# Patient Record
Sex: Female | Born: 1935 | Race: White | Hispanic: No | Marital: Married | Smoking: Never smoker
Health system: Southern US, Community
[De-identification: ages and names within clinical notes are randomized; demographics above are authoritative.]

## PROBLEM LIST (undated history)

## (undated) DIAGNOSIS — D649 Anemia, unspecified: Secondary | ICD-10-CM

## (undated) DIAGNOSIS — J439 Emphysema, unspecified: Secondary | ICD-10-CM

## (undated) DIAGNOSIS — R911 Solitary pulmonary nodule: Secondary | ICD-10-CM

## (undated) DIAGNOSIS — J45909 Unspecified asthma, uncomplicated: Secondary | ICD-10-CM

## (undated) DIAGNOSIS — I350 Nonrheumatic aortic (valve) stenosis: Secondary | ICD-10-CM

## (undated) DIAGNOSIS — J339 Nasal polyp, unspecified: Secondary | ICD-10-CM

## (undated) DIAGNOSIS — I2510 Atherosclerotic heart disease of native coronary artery without angina pectoris: Secondary | ICD-10-CM

## (undated) DIAGNOSIS — K219 Gastro-esophageal reflux disease without esophagitis: Secondary | ICD-10-CM

## (undated) DIAGNOSIS — J189 Pneumonia, unspecified organism: Secondary | ICD-10-CM

## (undated) DIAGNOSIS — R0600 Dyspnea, unspecified: Secondary | ICD-10-CM

## (undated) DIAGNOSIS — E785 Hyperlipidemia, unspecified: Secondary | ICD-10-CM

## (undated) DIAGNOSIS — E039 Hypothyroidism, unspecified: Secondary | ICD-10-CM

## (undated) DIAGNOSIS — K7460 Unspecified cirrhosis of liver: Secondary | ICD-10-CM

## (undated) DIAGNOSIS — Z8719 Personal history of other diseases of the digestive system: Secondary | ICD-10-CM

## (undated) DIAGNOSIS — M1990 Unspecified osteoarthritis, unspecified site: Secondary | ICD-10-CM

## (undated) DIAGNOSIS — G459 Transient cerebral ischemic attack, unspecified: Secondary | ICD-10-CM

## (undated) DIAGNOSIS — N189 Chronic kidney disease, unspecified: Secondary | ICD-10-CM

## (undated) DIAGNOSIS — I10 Essential (primary) hypertension: Secondary | ICD-10-CM

## (undated) HISTORY — DX: Nonrheumatic aortic (valve) stenosis: I35.0

## (undated) HISTORY — PX: NASAL SINUS SURGERY: SHX719

## (undated) HISTORY — DX: Nasal polyp, unspecified: J33.9

## (undated) HISTORY — DX: Unspecified osteoarthritis, unspecified site: M19.90

## (undated) HISTORY — DX: Hyperlipidemia, unspecified: E78.5

## (undated) HISTORY — DX: Solitary pulmonary nodule: R91.1

## (undated) HISTORY — DX: Essential (primary) hypertension: I10

## (undated) HISTORY — DX: Unspecified cirrhosis of liver: K74.60

## (undated) HISTORY — DX: Chronic kidney disease, unspecified: N18.9

---

## 1997-05-23 ENCOUNTER — Other Ambulatory Visit: Admission: RE | Admit: 1997-05-23 | Discharge: 1997-05-23 | Payer: Self-pay | Admitting: Gynecology

## 1998-01-03 ENCOUNTER — Other Ambulatory Visit: Admission: RE | Admit: 1998-01-03 | Discharge: 1998-01-03 | Payer: Self-pay | Admitting: Gynecology

## 1998-02-08 ENCOUNTER — Other Ambulatory Visit: Admission: RE | Admit: 1998-02-08 | Discharge: 1998-02-08 | Payer: Self-pay | Admitting: Gynecology

## 1999-01-25 ENCOUNTER — Other Ambulatory Visit: Admission: RE | Admit: 1999-01-25 | Discharge: 1999-01-25 | Payer: Self-pay | Admitting: Gynecology

## 2000-01-09 ENCOUNTER — Other Ambulatory Visit: Admission: RE | Admit: 2000-01-09 | Discharge: 2000-01-09 | Payer: Self-pay | Admitting: Internal Medicine

## 2003-09-27 HISTORY — PX: ROTATOR CUFF REPAIR: SHX139

## 2004-01-04 ENCOUNTER — Inpatient Hospital Stay (HOSPITAL_COMMUNITY): Admission: EM | Admit: 2004-01-04 | Discharge: 2004-01-05 | Payer: Self-pay | Admitting: Emergency Medicine

## 2004-01-04 ENCOUNTER — Ambulatory Visit: Payer: Self-pay | Admitting: Internal Medicine

## 2004-02-14 ENCOUNTER — Encounter: Admission: RE | Admit: 2004-02-14 | Discharge: 2004-02-14 | Payer: Self-pay | Admitting: Gastroenterology

## 2004-02-22 ENCOUNTER — Encounter (INDEPENDENT_AMBULATORY_CARE_PROVIDER_SITE_OTHER): Payer: Self-pay | Admitting: Specialist

## 2004-02-22 ENCOUNTER — Observation Stay (HOSPITAL_COMMUNITY): Admission: RE | Admit: 2004-02-22 | Discharge: 2004-02-23 | Payer: Self-pay

## 2004-02-27 HISTORY — PX: CHOLECYSTECTOMY: SHX55

## 2004-04-16 ENCOUNTER — Other Ambulatory Visit: Admission: RE | Admit: 2004-04-16 | Discharge: 2004-04-16 | Payer: Self-pay | Admitting: Family Medicine

## 2004-07-31 ENCOUNTER — Ambulatory Visit (HOSPITAL_COMMUNITY): Admission: RE | Admit: 2004-07-31 | Discharge: 2004-07-31 | Payer: Self-pay | Admitting: Gastroenterology

## 2004-07-31 ENCOUNTER — Encounter (INDEPENDENT_AMBULATORY_CARE_PROVIDER_SITE_OTHER): Payer: Self-pay | Admitting: Specialist

## 2005-04-29 ENCOUNTER — Encounter: Admission: RE | Admit: 2005-04-29 | Discharge: 2005-04-29 | Payer: Self-pay | Admitting: Family Medicine

## 2005-06-03 ENCOUNTER — Other Ambulatory Visit: Admission: RE | Admit: 2005-06-03 | Discharge: 2005-06-03 | Payer: Self-pay | Admitting: Family Medicine

## 2006-10-29 HISTORY — PX: TRIGGER FINGER RELEASE: SHX641

## 2007-06-11 ENCOUNTER — Other Ambulatory Visit: Admission: RE | Admit: 2007-06-11 | Discharge: 2007-06-11 | Payer: Self-pay | Admitting: Family Medicine

## 2008-02-25 ENCOUNTER — Encounter: Admission: RE | Admit: 2008-02-25 | Discharge: 2008-02-25 | Payer: Self-pay | Admitting: Family Medicine

## 2008-06-07 ENCOUNTER — Encounter: Admission: RE | Admit: 2008-06-07 | Discharge: 2008-06-07 | Payer: Self-pay | Admitting: General Surgery

## 2008-12-29 ENCOUNTER — Other Ambulatory Visit: Admission: RE | Admit: 2008-12-29 | Discharge: 2008-12-29 | Payer: Self-pay | Admitting: Family Medicine

## 2009-09-12 ENCOUNTER — Ambulatory Visit (HOSPITAL_COMMUNITY): Admission: RE | Admit: 2009-09-12 | Discharge: 2009-09-14 | Payer: Self-pay | Admitting: Obstetrics and Gynecology

## 2009-09-12 ENCOUNTER — Encounter (INDEPENDENT_AMBULATORY_CARE_PROVIDER_SITE_OTHER): Payer: Self-pay | Admitting: Obstetrics and Gynecology

## 2009-09-12 HISTORY — PX: INCONTINENCE SURGERY: SHX676

## 2009-12-13 ENCOUNTER — Ambulatory Visit (HOSPITAL_COMMUNITY): Admission: RE | Admit: 2009-12-13 | Discharge: 2009-12-13 | Payer: Self-pay | Admitting: Gastroenterology

## 2010-02-11 ENCOUNTER — Encounter: Payer: Self-pay | Admitting: Family Medicine

## 2010-03-14 ENCOUNTER — Ambulatory Visit (HOSPITAL_COMMUNITY)
Admission: RE | Admit: 2010-03-14 | Discharge: 2010-03-14 | Disposition: A | Discharge status: Home / Self Care | Payer: Federal, State, Local not specified - PPO | Source: Ambulatory Visit | Attending: Gastroenterology | Admitting: Gastroenterology

## 2010-03-14 DIAGNOSIS — E785 Hyperlipidemia, unspecified: Secondary | ICD-10-CM | POA: Insufficient documentation

## 2010-03-14 DIAGNOSIS — Z90710 Acquired absence of both cervix and uterus: Secondary | ICD-10-CM | POA: Insufficient documentation

## 2010-03-14 DIAGNOSIS — I10 Essential (primary) hypertension: Secondary | ICD-10-CM | POA: Insufficient documentation

## 2010-03-14 DIAGNOSIS — Z860100 Personal history of colon polyps, unspecified: Secondary | ICD-10-CM | POA: Insufficient documentation

## 2010-03-14 DIAGNOSIS — R7989 Other specified abnormal findings of blood chemistry: Secondary | ICD-10-CM | POA: Insufficient documentation

## 2010-03-14 DIAGNOSIS — J45909 Unspecified asthma, uncomplicated: Secondary | ICD-10-CM | POA: Insufficient documentation

## 2010-03-14 DIAGNOSIS — Z79899 Other long term (current) drug therapy: Secondary | ICD-10-CM | POA: Insufficient documentation

## 2010-03-20 ENCOUNTER — Other Ambulatory Visit: Payer: Self-pay | Admitting: Family Medicine

## 2010-03-20 DIAGNOSIS — E785 Hyperlipidemia, unspecified: Secondary | ICD-10-CM

## 2010-03-20 DIAGNOSIS — R42 Dizziness and giddiness: Secondary | ICD-10-CM

## 2010-03-21 ENCOUNTER — Ambulatory Visit
Admission: RE | Admit: 2010-03-21 | Discharge: 2010-03-21 | Disposition: A | Discharge status: Home / Self Care | Payer: Federal, State, Local not specified - PPO | Source: Ambulatory Visit | Attending: Family Medicine | Admitting: Family Medicine

## 2010-03-21 DIAGNOSIS — R42 Dizziness and giddiness: Secondary | ICD-10-CM

## 2010-03-21 DIAGNOSIS — E785 Hyperlipidemia, unspecified: Secondary | ICD-10-CM

## 2010-04-06 LAB — SURGICAL PCR SCREEN
MRSA, PCR: NEGATIVE
Staphylococcus aureus: NEGATIVE

## 2010-04-06 LAB — CBC
HCT: 29.4 % — ABNORMAL LOW (ref 36.0–46.0)
HCT: 38.2 % (ref 36.0–46.0)
Hemoglobin: 10 g/dL — ABNORMAL LOW (ref 12.0–15.0)
Hemoglobin: 12.9 g/dL (ref 12.0–15.0)
MCH: 31.2 pg (ref 26.0–34.0)
MCH: 31.3 pg (ref 26.0–34.0)
MCHC: 33.9 g/dL (ref 30.0–36.0)
MCHC: 34 g/dL (ref 30.0–36.0)
MCV: 91.9 fL (ref 78.0–100.0)
MCV: 92.1 fL (ref 78.0–100.0)
Platelets: 186 10*3/uL (ref 150–400)
Platelets: 234 10*3/uL (ref 150–400)
RBC: 3.2 MIL/uL — ABNORMAL LOW (ref 3.87–5.11)
RBC: 4.16 MIL/uL (ref 3.87–5.11)
RDW: 13.4 % (ref 11.5–15.5)
RDW: 13.9 % (ref 11.5–15.5)
WBC: 14.5 10*3/uL — ABNORMAL HIGH (ref 4.0–10.5)
WBC: 7.3 10*3/uL (ref 4.0–10.5)

## 2010-04-06 LAB — URINALYSIS, ROUTINE W REFLEX MICROSCOPIC
Bilirubin Urine: NEGATIVE
Glucose, UA: NEGATIVE mg/dL
Hgb urine dipstick: NEGATIVE
Ketones, ur: NEGATIVE mg/dL
Nitrite: NEGATIVE
Protein, ur: NEGATIVE mg/dL
Specific Gravity, Urine: 1.015 (ref 1.005–1.030)
Urobilinogen, UA: 0.2 mg/dL (ref 0.0–1.0)
pH: 7 (ref 5.0–8.0)

## 2010-04-06 LAB — COMPREHENSIVE METABOLIC PANEL
ALT: 28 U/L (ref 0–35)
ALT: 42 U/L — ABNORMAL HIGH (ref 0–35)
AST: 33 U/L (ref 0–37)
AST: 52 U/L — ABNORMAL HIGH (ref 0–37)
Albumin: 3 g/dL — ABNORMAL LOW (ref 3.5–5.2)
Albumin: 3.9 g/dL (ref 3.5–5.2)
Alkaline Phosphatase: 144 U/L — ABNORMAL HIGH (ref 39–117)
Alkaline Phosphatase: 205 U/L — ABNORMAL HIGH (ref 39–117)
BUN: 15 mg/dL (ref 6–23)
BUN: 9 mg/dL (ref 6–23)
CO2: 31 mEq/L (ref 19–32)
CO2: 35 mEq/L — ABNORMAL HIGH (ref 19–32)
Calcium: 8.2 mg/dL — ABNORMAL LOW (ref 8.4–10.5)
Calcium: 9.9 mg/dL (ref 8.4–10.5)
Chloride: 97 mEq/L (ref 96–112)
Chloride: 98 mEq/L (ref 96–112)
Creatinine, Ser: 0.99 mg/dL (ref 0.4–1.2)
Creatinine, Ser: 1.17 mg/dL (ref 0.4–1.2)
GFR calc Af Amer: 55 mL/min — ABNORMAL LOW (ref >60)
GFR calc Af Amer: >60 mL/min (ref >60)
GFR calc non Af Amer: 45 mL/min — ABNORMAL LOW (ref >60)
GFR calc non Af Amer: 55 mL/min — ABNORMAL LOW (ref >60)
Glucose, Bld: 122 mg/dL — ABNORMAL HIGH (ref 70–99)
Glucose, Bld: 93 mg/dL (ref 70–99)
Potassium: 3.4 mEq/L — ABNORMAL LOW (ref 3.5–5.1)
Potassium: 3.8 mEq/L (ref 3.5–5.1)
Sodium: 134 mEq/L — ABNORMAL LOW (ref 135–145)
Sodium: 140 mEq/L (ref 135–145)
Total Bilirubin: 0.7 mg/dL (ref 0.3–1.2)
Total Bilirubin: 0.9 mg/dL (ref 0.3–1.2)
Total Protein: 5.4 g/dL — ABNORMAL LOW (ref 6.0–8.3)
Total Protein: 7.7 g/dL (ref 6.0–8.3)

## 2010-04-06 LAB — URINE MICROSCOPIC-ADD ON

## 2010-06-08 NOTE — Cardiovascular Report (Signed)
[name], [name]                 ACCOUNT NO.:  [id number]   MEDICAL RECORD NO.:  [id number]          PATIENT TYPE:  INP   LOCATION:  [id number]                         FACILITY:  [facility]   PHYSICIAN:  Ernestine Mcmurray, M.D. LHCDATE OF BIRTH:  1931-02-24   DATE OF PROCEDURE:  01/05/2004  DATE OF DISCHARGE:  01/05/2004                              CARDIAC CATHETERIZATION   PROCEDURE:  1. Left heart catheterization with selective angiography.  2. Ventriculography.     DIAGNOSIS:  Non obstructive coronary artery disease.   INDICATION:  The patient is a 77 year old female with no prior history of  known coronary artery disease but with multiple risk factors. The patient  presents with atypical chest pain and she was referred for cardiac  catheterization. Informed consent was obtained and the patient was brought  for catheterization of the left heart where she was thoroughly prepped and  draped.  A #6 French arterial sheath was employed using the modified  Seldinger technique. Then the Granbury catheter were used for coronary  angiography and a pigtail for ventriculography. No complications occurred  with the procedure. At termination of the procedure all catheters and  sheaths were removed and the patient was brought back to the holding area.   FINDINGS:  1. HEMODYNAMICS. Left ventricular pressure 125/4 mmHg. Aortic pressure 1      mmHg with no gradient across the aortic valve.  2. VENTRICULOGRAPHY. Ejection fraction 55-60% with no wall motion      abnormalities and no mitral regurgitation.   SELECTIVE CORONARY ANGIOGRAPHY.  1. The left main coronary artery is a large caliber vessel with no      evidence of _______disease.  The left anterior descending artery was a      moderate to large caliber vessel with a proximal 50% stenosis and      diffuse stenosis in the mid vessel approximately 30-40%.  The first      diagonal had a 60% stenosis, the remainder of the diagonal branch was  free of ______disease.   1. The circumflex coronary artery was a large caliber vessel with a      paucity of 40-50% stenosis in the first obtuse marginal branch.   1. The right coronary artery was dominant. The mid body of the right      coronary artery had a proximal diffuse 20-30% stenosis, otherwise there      was no significant finding of coronary artery disease.     CONCLUSION:  Mild non obstructive coronary artery disease. No definite  indication for percutaneous intervention. Would continue medical therapy.      GED/MEDQ  D:  06/16/2004  T:  06/16/2004  Job:  [id number]   cc:   Junious Silk, M.D. [facility]

## 2010-06-08 NOTE — Op Note (Signed)
NAMEPETRONA, [name] [name]                 ACCOUNT NO.:  [id number]   MEDICAL RECORD NO.:  [id number]          PATIENT TYPE:  OBV   LOCATION:  [id number]                         [facility]ACILITY:  [facility]   PHYSICIAN:  Larrie Kass., M.D.DATE OF BIRTH:  September 10, 1931   DATE OF PROCEDURE:  02/22/2004  DATE OF DISCHARGE:                                 OPERATIVE REPORT   PREOPERATIVE DIAGNOSIS:  Symptomatic gallstones.   POSTOPERATIVE DIAGNOSIS:  Symptomatic gallstones.   OPERATION:  Laparoscopic cholecystectomy.   SURGEON:  Georgina Quint, M.D.   ASSISTANT:  Orson Ape. Rise Patience, M.D.   ANESTHESIA:  General.   PROCEDURE:  After the patient was monitored and anesthetized and had routine  preparation and draping of the abdomen, I made a short incision transversely  just below the umbilicus right over the palpable umbilical hernia. I  dissected the umbilical hernia contents away from the umbilical skin and  surrounding subcutaneous tissues and found that it was incarcerated  preperitoneal fat with a very tiny opening. I used cautery to amputate the  fat. I then extended the opening toward the umbilicus and a bit caudad as  well and grasped the fascia with Kochers and then bluntly entered the  peritoneal cavity with a Kelly clamp. I placed a 0 Vicryl pursestring suture  in the fascia, then secured a Hasson cannula and inflated the abdomen with  CO2. I examined the abdominal contents and saw no abnormalities of the  intestines or the peritoneal surfaces. The gallbladder was moderately  distended and inflamed with omentum adherent to the undersurface. I then  anesthetized three additional spots and placed three laparoscopic ports, one  in the epigastrium, two in the right mid abdomen and positioned the patient  head up, foot down and tilted to the left. Grasping the fundus of the  gallbladder and retracting it toward the right shoulder, I took down the  adhesions of bluntly and with cautery and identified  the infundibulum of  gallbladder. I pulled the infundibulum laterally toward the right and  dissected out the infundibulum as it formed the cystic duct. I also  dissected out the cystic artery. Vision was a little bit obscured by very  large lymph node in the porta hepatis and I removed that. I clipped the  cystic artery with three clips and cut between the two which were closest to  the gallbladder and then identified another small posterior cystic artery  branch which I also clipped and divided. I placed a clip on the cystic duct  as it emerged from the infundibulum of the gallbladder, made a small nick in  the cystic duct milked out a few tiny stones which were present in the  cystic duct.  I then clipped the cystic duct distally with three clips and  divided it. I dissected the gallbladder from the liver using the cautery and  gaining hemostasis with cautery as the dissection proceeded. After detaching  the gallbladder from the liver, I placed into a plastic pouch and removed it  through the umbilical incision and tied the pursestring suture to close the  defect.  We then copiously irrigated the right upper quadrant and removed the  irrigant and saw that the clips were secure and that hemostasis was  excellent. I removed  the two lateral ports under direct vision and saw no bleeding from the  abdominal wall.  I allowed the CO2 to escape and removed the epigastric  port. I closed all skin incisions with intracuticular 4-0 Vicryl and Steri-  Strips. The patient was stable through the procedure.      WB/MEDQ  D:  02/22/2004  T:  02/22/2004  Job:  [id number]   cc:   Elyse Jarvis. Amedeo Plenty, M.D.  [id number], [place]  [place] [address]  Fax: [phone]   Vikki Ports, M.D.  [address] [place], [place] [address]  Fax: [phone]

## 2010-06-08 NOTE — H&P (Signed)
[name] [name], [name] [name]                 ACCOUNT NO.:  [id number]   MEDICAL RECORD NO.:  [id number]          PATIENT TYPE:  EMS   LOCATION:  MAJO                         FACILITY:  [facility]   PHYSICIAN:  Ashby Dawes. Polite, M.D. DATE OF BIRTH:  1931/09/12   DATE OF ADMISSION:  01/04/2004  DATE OF DISCHARGE:                                HISTORY & PHYSICAL   CHIEF COMPLAINT:  Chest pain.   HISTORY OF PRESENT ILLNESS:  [name] [name] is a pleasant 77 year old female with  known history of hypertension, high cholesterol, hiatal hernia and GERD, who  presents to the ED with complaint of substernal chest pain.  The patient  states her symptoms started at rest approximately a half an hour after  eating pecans.  The patient later complained of feeling somewhat nauseated  as if she wanted to throw up.  The patient states symptoms were like  pressure and rated as 8/10; she denied any radiation.  As stated, the  patient states the pain started about 11:30 last p.m. and because of the  continued nature of these symptoms, the patient presented to the ED at her  husband's insistence of further evaluation to rule out coronary artery  disease.  In the ED, the patient was evaluated and had point-of-care enzymes  within normal limits, EKG within normal limits and patient had a chest x-ray  which showed hiatal hernia.  The patient was given sublingual nitroglycerin;  there is some controversy in the report if she got relief with the  nitroglycerin or not.  The patient states that she thinks her symptoms are  just starting to get better a half an hour to 45 minutes after receiving the  nitroglycerin.  Please note, the patient has had reflux symptoms in the  past, but denies past reflux symptoms being similar to her current chest  discomfort.  Also, please note the patient denies any orthopnea, any PND.  The patient does exercise approximately 3 times a week, walking about a mile  without chest pain, pressure or  palpitations.  Because of the patient's  history of hypertension and high cholesterol, admission is deemed necessary  for further evaluation of chest pain.   PAST MEDICAL HISTORY:  Past medical history, as stated above, is significant  for hypertension, high cholesterol and hiatal hernia/GERD.  Please note, the  patient had a stress test in the spring of '05 and per her report was  negative, performed by Dr. Wyonia Hough. [place].   MEDICATIONS ON ADMISSION:  Medications on admission include Nexium, Toprol,  hydrochlorothiazide, Zyrtec, Niaspan, Zetia.   SOCIAL HISTORY:  Social history is negative for tobacco.  Occasional  alcohol.  No drugs.   PAST SURGICAL HISTORY:  1.  Past surgical history is significant for sinus surgery in '97.  2.  The patient had nasal polyps removed.  3.  The patient had right rotator cuff surgery in September 2005.   FAMILY HISTORY:  Mother with hypertension and bronchiectasis.  Father  deceased of CVA.  The patient has 2 brothers, 1 with an MI in his 30s, 1  with  gluten intolerance.  The patient has a sister with history of  hypertension and questionable sarcoidosis.   ALLERGIES:  The patient described allergy to ASPIRIN, which causes shortness  of breath and tachycardia.   REVIEW OF SYSTEMS:  Review of systems as stated in the HPI.   PHYSICAL EXAM:  GENERAL:  The patient is alert and oriented x3.  VITAL SIGNS:  Temperature 97.4, BP 116/58, pulse 88, respiratory rate of 16.  HEENT:  Within normal limits.  NECK:  No carotid bruits appreciated.  CHEST:  Chest clear to auscultation bilaterally.  CARDIOVASCULAR:  Regular S1 and S2.  No S3 appreciated.  ABDOMEN:  Abdomen is soft and nontender.  No hepatosplenomegaly.  EXTREMITIES:  No clubbing, cyanosis, or edema.  NEUROLOGIC:  Exam nonfocal.   DATA:  Chest x-ray shows hiatal hernia, otherwise no apparent disease.   CBC:  White count 17.3, hemoglobin 12.1, hematocrit 35, platelets 358,000.  Point-of-care  enzymes:  Myoglobin 65.9, CK-MB 2.1, troponin I less than  0.05.  CMET:  Sodium 138, potassium 3.5, chloride 100, carbon dioxide 29,  BUN 14, creatinine 1.0.  AST and ALT within normal limits, bilirubin within  normal limits, lipase 24.   ASSESSMENT:  1.  Atypical chest pain at rest in a patient with reported history of having      a negative stress test in the spring of 2005.  Differential diagnosis      includes coronary artery disease versus gastrointestinal-related, that      is, secondary to hiatal hernia.  2.  Hypertension.  3.  High cholesterol.  4.  Hiatal hernia/gastroesophageal reflux disease.  5.  Leukocytosis, cause unknown at this time, chest x-ray without      infiltrate.  The patient was treated for upper respiratory infection      approximately 1-1/2 to 2 weeks ago.  The patient denies any urinary      symptoms.   RECOMMENDATION:  Recommend patient be admitted to a telemetry floor bed.  We  will obtain serial cardiac enzymes.  We will obtain an EKG in the morning.  We will treat with a PPI.  As the patient appears to have significant  cardiac anxiety, we will ask the patient's cardiologist to come by and see  her to discuss if he feels further intervention is indicated.  As for the  patient's leukocytosis, we will repeat a CBC in the a.m.  We will check a UA  for completeness, otherwise, we will hold antibiotics at this time.      Ronalee Red   RDP/MEDQ  D:  01/04/2004  T:  01/04/2004  Job:  [id number]   cc:   Vikki Ports, M.D.  [address] [name] [address]  Fax: [phone]

## 2010-06-08 NOTE — Consult Note (Signed)
[name] [name]                 ACCOUNT NO.:  [id number]   MEDICAL RECORD NO.:  [id number]          PATIENT TYPE:  INP   LOCATION:  [id number]                         FACILITY:  [facility]   PHYSICIAN:  Deboraha Sprang, M.D.  DATE OF BIRTH:  1931-05-24   DATE OF CONSULTATION:  DATE OF DISCHARGE:                                   CONSULTATION   DATE OF CONSULTATION:  January 04, 2004.   REASON FOR CONSULTATION:  Thank you very much for asking [place] to see [name] [name] in cardiologic consultation for chest pain with typical and atypical  features.   [name] [name] is a 77 year old woman with known GE reflux disease and known  cardiac risk factors, hypertension, dyslipidemia in double therapy, and a  family history of heart disease.  She also has an elevated blood sugar on  presentation today.   She was seen initially by Dr. Vicenta Aly in the spring of 2005 because of  atypical chest pain and exercise-associated shortness of breath.  Evaluation  included an echo that was normal, and a Cardiolite that was not entirely  normal with anteroseptal reversible activity, which was felt to be possibly  a septal ischemia or a right ventricular artifact.  It was elected to not  treat her at that point for coronary artery disease.   She has not had intercurrent problems with chest pain.  She has actually  been exercising quite vigorously and can now walk 20 minutes on a treadmill,  accomplishing a mile without symptoms.   Last evening, she awakened with a severe, mid sternal, epigastric discomfort  that she described as a weight.  It was similar to previous indigestion;  however, it was unrelieved by eructation.  She then progressed to having  nausea, her husband brought her to the emergency room, electrocardiogram was  not revealing for ischemia.  She was given three nitroglycerin over the  ensuing hour or so with relief of discomfort after about an hour and a half.  Her enzymes were subsequently normal,  she was admitted for observation.   PAST MEDICAL HISTORY:  In addition to the above is notable for asthma.   MEDICATIONS:  Include Nexium, Toprol, hydrochlorothiazide, Nystatin, Zetia,  and Zyrtec, the doses are not known.   ALLERGIES:  1.  ASPIRIN.  2.  CECLOR.  3.  Lotrel.  4.  SULFA.  5.  LIPITOR.   PAST SURGICAL HISTORY:  1.  Rotator cuff surgery.  2.  Sinus surgery.   SOCIAL HISTORY:  She is married, she has three children, she is retired from  [employer].   REVIEW OF SYSTEMS:  As noted on the intake sheet from Gene Serpe, PA, and is  notable for sputum and a cold a couple of weeks ago.   PHYSICAL EXAMINATION:  GENERAL:  She is an elderly Caucasian female  appearing younger than her stated age of 77.  She was in no acute distress.  VITAL SIGNS:  Her blood pressure was 101/62, her pulse was 80, her  respirations were 18.  She was afebrile.  HEENT:  No trismus or  xanthoma.  NECK:  The neck veins were flat.  The carotids were brisk and full  bilaterally without bruits.  BACK:  Without kyphosis or scoliosis.  LUNGS:  Clear.  HEART:  Heart sounds were regular without murmurs or gallops.  ABDOMEN:  Soft with a little bit of epigastric tenderness, bowel sounds were  active, and there was normal midline pulsation.  EXTREMITIES:  Femoral pulses were 2+, distal pulses were intact, and there  was no clubbing, cyanosis, or edema.  NEUROLOGICAL:  Grossly normal.  SKIN:  Warm and dry.   Blood work was notable for a white blood count of 17.3, a hemoglobin of 12,  and a blood sugar of 140.   Electrocardiogram dated earlier this morning demonstrated sinus rhythm at 72  with intervals of 0.15/0.08/0.39.  There was PR segment depression in leads  2 and 3 without evidence of ST segment elevation.   IMPRESSION:  1.  Chest pain with typical and atypical features.  2.  Cardiac risk factors notable for:      1.  Hypertension.      2.  Family history.      3.  Hypercholesterolemia.  3.   Elevated blood sugar, question diabetes.  4.  Gastroesophageal reflux disease.  5.  Borderline abnormal Cardiolite in May 2005.  6.  Electrocardiograms demonstrating PR segment depression without ST      segment elevation.  7.  Increased white count and decreased hemoglobin.   DISCUSSION:  [name] [name] has recurrent chest pain in the setting of multiple  cardiac risk factors.  Although her symptoms are suggestive of a GI process  in their similarity to previous GI symptoms and their protracted nature  without evidence of enzyme abnormality, I think that given the constellation  of risks and the not normal Cardiolite obtained in the Spring, that  catheterization is at least worthy to consider.  I will defer this decision  ultimately to Dr. Vicenta Aly, who knows the patient and her family very well.   I do not think that the ECG is consistent with pericarditis nor is the  history not withstanding the PR segment depression.   RECOMMENDATIONS BASED ON THE ABOVE:  1.  Anticipate catheterization in the morning, but will defer this decision      to Dr. Vicenta Aly.  2.  Check a fasting blood sugar and a hemoglobin A1c.  3.  Repeat the CBC.   Thank you for the consultation.       SCK/MEDQ  D:  01/04/2004  T:  01/04/2004  Job:  [id number]   cc:   Vikki Ports, M.D.  [address] [place], [place] [address]  Fax: [phone]

## 2011-03-12 ENCOUNTER — Ambulatory Visit (INDEPENDENT_AMBULATORY_CARE_PROVIDER_SITE_OTHER): Payer: Federal, State, Local not specified - PPO | Admitting: General Surgery

## 2011-03-12 ENCOUNTER — Encounter (INDEPENDENT_AMBULATORY_CARE_PROVIDER_SITE_OTHER): Payer: Self-pay | Admitting: General Surgery

## 2011-03-12 VITALS — BP 134/90 | HR 84 | Temp 97.9°F | Resp 18 | Ht 61.0 in | Wt 142.2 lb

## 2011-03-12 DIAGNOSIS — M792 Neuralgia and neuritis, unspecified: Secondary | ICD-10-CM

## 2011-03-12 DIAGNOSIS — IMO0002 Reserved for concepts with insufficient information to code with codable children: Secondary | ICD-10-CM

## 2011-03-12 NOTE — Progress Notes (Signed)
Subjective:     Patient ID: [name] [name], female   DOB: March 05, 1931, 78 y.o.   MRN: [id number]  HPI We are asked to see the patient in consultation by Dr. Antony Blackbird to evaluate her for a lipoma of her left arm. The patient is a 78 year old white female who has been experiencing some pain in the upper lateral left arm for the last few months she states that she also has some neck pain and has to go to a chiropractor regularly to get straightened out. She does not appear to have lost any strength or sensation in her hand. She does not recall any trauma to the area of soreness.  Review of Systems  Constitutional: Negative.   HENT: Negative.   Eyes: Negative.   Respiratory: Negative.   Cardiovascular: Negative.   Gastrointestinal: Negative.   Genitourinary: Negative.   Musculoskeletal: Negative.   Skin: Negative.   Neurological: Negative.   Hematological: Negative.   Psychiatric/Behavioral: Negative.        Objective:   Physical Exam  Constitutional: She is oriented to person, place, and time. She appears well-developed and well-nourished.  HENT:  Head: Normocephalic and atraumatic.  Eyes: Conjunctivae and EOM are normal. Pupils are equal, round, and reactive to light.  Neck: Normal range of motion. Neck supple.  Cardiovascular: Normal rate, regular rhythm and normal heart sounds.   Pulmonary/Chest: Effort normal and breath sounds normal.  Abdominal: Soft. Bowel sounds are normal.  Musculoskeletal: Normal range of motion.       She has some point tenderness or laterally over the upper left arm. There is no palpable mass in this area. She has good strength and sensation in her hand  Neurological: She is alert and oriented to person, place, and time.  Skin: Skin is warm and dry.  Psychiatric: She has a normal mood and affect. Her behavior is normal.       Assessment:     Tenderness in the upper lateral left arm. I do not palpate any evidence of a lipoma in this area. 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    Plan:     I would recommend an MRI of her C-spine to look for source of her pain. We will call her with the results of the study and if the study is positive we will refer her to a neurosurgeon.

## 2011-03-12 NOTE — Progress Notes (Signed)
Addended by: Luella Cook III on: 03/12/2011 10:27 AM   Modules accepted: Orders

## 2011-03-15 ENCOUNTER — Ambulatory Visit
Admission: RE | Admit: 2011-03-15 | Discharge: 2011-03-15 | Disposition: A | Discharge status: Home / Self Care | Payer: Federal, State, Local not specified - PPO | Source: Ambulatory Visit | Attending: General Surgery | Admitting: General Surgery

## 2011-03-15 DIAGNOSIS — M792 Neuralgia and neuritis, unspecified: Secondary | ICD-10-CM

## 2011-03-15 MED ORDER — GADOBENATE DIMEGLUMINE 529 MG/ML IV SOLN: 7.0000 mL | Freq: Once | INTRAVENOUS | Status: AC | PRN

## 2011-03-18 ENCOUNTER — Other Ambulatory Visit: Payer: Federal, State, Local not specified - PPO

## 2011-05-06 ENCOUNTER — Other Ambulatory Visit (INDEPENDENT_AMBULATORY_CARE_PROVIDER_SITE_OTHER): Payer: Self-pay | Admitting: General Surgery

## 2011-05-06 DIAGNOSIS — M79602 Pain in left arm: Secondary | ICD-10-CM

## 2011-05-06 DIAGNOSIS — M542 Cervicalgia: Secondary | ICD-10-CM

## 2011-05-09 ENCOUNTER — Telehealth (INDEPENDENT_AMBULATORY_CARE_PROVIDER_SITE_OTHER): Payer: Self-pay

## 2011-05-09 NOTE — Telephone Encounter (Signed)
Appointment information with Dr. Sherley Bounds mailed to patient's home address.  Information left on her voicemail.  She is advised to call and confirm.

## 2012-06-24 ENCOUNTER — Encounter (HOSPITAL_COMMUNITY): Payer: Self-pay | Admitting: Emergency Medicine

## 2012-06-24 ENCOUNTER — Emergency Department (HOSPITAL_COMMUNITY): Payer: Federal, State, Local not specified - PPO

## 2012-06-24 ENCOUNTER — Observation Stay (HOSPITAL_COMMUNITY)
Admission: EM | Admit: 2012-06-24 | Discharge: 2012-06-25 | Disposition: A | Discharge status: Home / Self Care | Payer: Federal, State, Local not specified - PPO | Attending: Internal Medicine | Admitting: Internal Medicine

## 2012-06-24 DIAGNOSIS — R531 Weakness: Secondary | ICD-10-CM

## 2012-06-24 DIAGNOSIS — E785 Hyperlipidemia, unspecified: Secondary | ICD-10-CM | POA: Insufficient documentation

## 2012-06-24 DIAGNOSIS — R270 Ataxia, unspecified: Secondary | ICD-10-CM

## 2012-06-24 DIAGNOSIS — R279 Unspecified lack of coordination: Secondary | ICD-10-CM

## 2012-06-24 DIAGNOSIS — R269 Unspecified abnormalities of gait and mobility: Secondary | ICD-10-CM | POA: Insufficient documentation

## 2012-06-24 DIAGNOSIS — I10 Essential (primary) hypertension: Secondary | ICD-10-CM

## 2012-06-24 DIAGNOSIS — M792 Neuralgia and neuritis, unspecified: Secondary | ICD-10-CM

## 2012-06-24 DIAGNOSIS — R42 Dizziness and giddiness: Secondary | ICD-10-CM | POA: Insufficient documentation

## 2012-06-24 DIAGNOSIS — R5383 Other fatigue: Secondary | ICD-10-CM

## 2012-06-24 DIAGNOSIS — R262 Difficulty in walking, not elsewhere classified: Secondary | ICD-10-CM | POA: Insufficient documentation

## 2012-06-24 DIAGNOSIS — Z79899 Other long term (current) drug therapy: Secondary | ICD-10-CM | POA: Insufficient documentation

## 2012-06-24 DIAGNOSIS — R5381 Other malaise: Secondary | ICD-10-CM

## 2012-06-24 DIAGNOSIS — G459 Transient cerebral ischemic attack, unspecified: Principal | ICD-10-CM

## 2012-06-24 LAB — CBC WITH DIFFERENTIAL/PLATELET
Basophils Absolute: 0 10*3/uL (ref 0.0–0.1)
Basophils Relative: 0 % (ref 0–1)
HCT: 39.3 % (ref 36.0–46.0)
Hemoglobin: 13.3 g/dL (ref 12.0–15.0)
Lymphocytes Relative: 31 % (ref 12–46)
MCHC: 33.8 g/dL (ref 30.0–36.0)
Monocytes Absolute: 0.5 10*3/uL (ref 0.1–1.0)
Monocytes Relative: 7 % (ref 3–12)
Neutro Abs: 4.4 10*3/uL (ref 1.7–7.7)
Neutrophils Relative %: 60 % (ref 43–77)
RDW: 13.2 % (ref 11.5–15.5)
WBC: 7.4 10*3/uL (ref 4.0–10.5)

## 2012-06-24 LAB — COMPREHENSIVE METABOLIC PANEL
AST: 50 U/L — ABNORMAL HIGH (ref 0–37)
Albumin: 3.6 g/dL (ref 3.5–5.2)
Alkaline Phosphatase: 278 U/L — ABNORMAL HIGH (ref 39–117)
CO2: 29 mEq/L (ref 19–32)
Chloride: 100 mEq/L (ref 96–112)
Creatinine, Ser: 1.13 mg/dL — ABNORMAL HIGH (ref 0.50–1.10)
GFR calc non Af Amer: 45 mL/min — ABNORMAL LOW (ref >90)
Potassium: 3.4 mEq/L — ABNORMAL LOW (ref 3.5–5.1)
Total Bilirubin: 0.6 mg/dL (ref 0.3–1.2)

## 2012-06-24 MED ORDER — MULTIVITAMINS PO CAPS: 1.0000 | ORAL_CAPSULE | Freq: Every day | ORAL | Status: DC

## 2012-06-24 MED ORDER — HYDROCHLOROTHIAZIDE 25 MG PO TABS
25.0000 mg | ORAL_TABLET | Freq: Every day | ORAL | Status: DC
  Administered 2012-06-25: 25 mg via ORAL
  Filled 2012-06-24: qty 1

## 2012-06-24 MED ORDER — METOPROLOL TARTRATE 25 MG PO TABS
25.0000 mg | ORAL_TABLET | Freq: Every day | ORAL | Status: DC
  Administered 2012-06-25: 25 mg via ORAL
  Filled 2012-06-24: qty 1

## 2012-06-24 MED ORDER — EZETIMIBE 10 MG PO TABS
10.0000 mg | ORAL_TABLET | Freq: Every day | ORAL | Status: DC
  Administered 2012-06-24: 10 mg via ORAL
  Filled 2012-06-24 (×2): qty 1

## 2012-06-24 MED ORDER — ENOXAPARIN SODIUM 40 MG/0.4ML ~~LOC~~ SOLN
40.0000 mg | SUBCUTANEOUS | Status: DC
  Administered 2012-06-24: 40 mg via SUBCUTANEOUS
  Filled 2012-06-24 (×2): qty 0.4

## 2012-06-24 MED ORDER — CLOPIDOGREL BISULFATE 75 MG PO TABS
75.0000 mg | ORAL_TABLET | Freq: Every day | ORAL | Status: DC
  Administered 2012-06-24 – 2012-06-25 (×2): 75 mg via ORAL
  Filled 2012-06-24 (×2): qty 1

## 2012-06-24 MED ORDER — SODIUM CHLORIDE 0.9 % IV SOLN
INTRAVENOUS | Status: DC
  Administered 2012-06-24: 15:00:00 via INTRAVENOUS

## 2012-06-24 MED ORDER — ADULT MULTIVITAMIN W/MINERALS CH
1.0000 | ORAL_TABLET | Freq: Every day | ORAL | Status: DC
  Administered 2012-06-25: 1 via ORAL
  Filled 2012-06-24: qty 1

## 2012-06-24 MED ORDER — ACETAMINOPHEN 325 MG PO TABS: 650.0000 mg | ORAL_TABLET | ORAL | Status: DC | PRN

## 2012-06-24 MED ORDER — EZETIMIBE 10 MG PO TABS: 10.0000 mg | ORAL_TABLET | Freq: Every day | ORAL | Status: DC

## 2012-06-24 MED ORDER — LORATADINE 10 MG PO TABS
10.0000 mg | ORAL_TABLET | Freq: Every day | ORAL | Status: DC
  Administered 2012-06-25: 10 mg via ORAL
  Filled 2012-06-24: qty 1

## 2012-06-24 MED ORDER — FLUTICASONE PROPIONATE 50 MCG/ACT NA SUSP
2.0000 | Freq: Every day | NASAL | Status: DC
  Administered 2012-06-25: 2 via NASAL
  Filled 2012-06-24 (×2): qty 16

## 2012-06-24 NOTE — ED Provider Notes (Signed)
History     CSN: [id number]  Arrival date & time 06/24/12  1401   First MD Initiated Contact with Patient 06/24/12 1507      Chief Complaint  Patient presents with  . Near Syncope    (Consider location/radiation/quality/duration/timing/severity/associated sxs/prior treatment) HPI Comments: 79 y.o. female who has pmh of htn, presents to the er after she had an episode of having a difficult time walking. Pt states she was working in her shed, doing her usual routine (she is at baseline very active), and states that when she was walking out, she suddenly was unable to walk correctly. She states she started to "veer to the left" -- and she almost fell to the ground. She did not have a headache, no chest pain, no sob, no diaphoresis, no blurry vision, no dizziness, no feels of passing out, no complaints of being lighted headed.  Patient is a 79 y.o. female presenting with general illness. The history is provided by the patient.  Illness Severity:  Mild Onset quality:  Sudden Timing:  Rare Progression:  Resolved Chronicity:  New Associated symptoms: no abdominal pain, no chest pain, no congestion, no cough, no diarrhea, no fatigue, no fever, no headaches, no rash, no vomiting and no wheezing     Past Medical History  Diagnosis Date  . Arthritis   . Hyperlipidemia   . Hypertension   . Lipoma     left arm    Past Surgical History  Procedure Laterality Date  . Rotator cuff repair  09/27/2003  . Cholecystectomy  02/27/04  . Trigger finger release  10/29/06  . Incontinence surgery  09/12/09    Family History  Problem Relation Age of Onset  . Cancer Maternal Grandmother     ovarian    History  Substance Use Topics  . Smoking status: Never Smoker   . Smokeless tobacco: Not on file  . Alcohol Use: No    OB History   Grav Para Term Preterm Abortions TAB SAB Ect Mult Living                  Review of Systems  Constitutional: Negative for fever, chills and fatigue.  HENT:  Negative for congestion, facial swelling, drooling, neck pain and dental problem.   Eyes: Negative for pain, discharge and itching.  Respiratory: Negative for cough, choking, wheezing and stridor.   Cardiovascular: Negative for chest pain.  Gastrointestinal: Negative for vomiting, abdominal pain and diarrhea.  Endocrine: Negative for cold intolerance and heat intolerance.  Genitourinary: Negative for vaginal discharge, difficulty urinating and vaginal pain.  Skin: Negative for pallor and rash.  Neurological: Positive for weakness. Negative for dizziness, light-headedness and headaches.  Psychiatric/Behavioral: Negative for behavioral problems and agitation.    Allergies  Aspirin and Ceclor  Home Medications   Current Outpatient Rx  Name  Route  Sig  Dispense  Refill  . CALCIUM PO   Oral   Take 1,000 mg by mouth daily.         . cetirizine (ZYRTEC) 10 MG tablet   Oral   Take 10 mg by mouth daily.         Marland Kitchen ezetimibe (ZETIA) 10 MG tablet   Oral   Take 10 mg by mouth daily.         . hydrochlorothiazide (HYDRODIURIL) 25 MG tablet   Oral   Take 25 mg by mouth daily.         . metoprolol tartrate (LOPRESSOR) 25 MG tablet  Oral   Take 25 mg by mouth daily.         . mometasone (NASONEX) 50 MCG/ACT nasal spray   Nasal   Place 2 sprays into the nose daily.         . Multiple Vitamin (MULTIVITAMIN) capsule   Oral   Take 1 capsule by mouth daily.         . risedronate (ACTONEL) 150 MG tablet   Oral   Take 150 mg by mouth every 30 (thirty) days. with water on empty stomach, nothing by mouth or lie down for next 30 minutes.           BP 151/85  Pulse 94  Temp(Src) 97.8 F (36.6 C)  Resp 16  SpO2 96%  Physical Exam  Constitutional: She is oriented to person, place, and time. She appears well-developed. No distress.  HENT:  Head: Normocephalic and atraumatic.  Eyes: Pupils are equal, round, and reactive to light. Right eye exhibits no discharge. Left  eye exhibits no discharge.  Neck: Neck supple. No tracheal deviation present.  Cardiovascular: Normal rate.  Exam reveals no gallop and no friction rub.   Pulmonary/Chest: No stridor. No respiratory distress. She has no wheezes.  Abdominal: Soft. She exhibits no distension. There is no tenderness. There is no rebound.  Musculoskeletal: She exhibits no edema and no tenderness.  Neurological: She is alert and oriented to person, place, and time.  She is able to walk in the room with normal gait. 5/5 strength UE and LE. Normal sensation. Finger to nose is normal. Heel to shin is normal. Romberg negative.   Skin: Skin is warm. She is not diaphoretic.    ED Course  Procedures (including critical care time)  Labs Reviewed  CBC WITH DIFFERENTIAL  COMPREHENSIVE METABOLIC PANEL   Ct Head Wo Contrast  06/24/2012   *RADIOLOGY REPORT*  Clinical Data: Syncope.  Evaluate for stroke.  CT HEAD WITHOUT CONTRAST  Technique:  Contiguous axial images were obtained from the base of the skull through the vertex without contrast.  Comparison: MRI 02/25/2008  Findings: Mild age related volume loss. No acute intracranial abnormality.  Specifically, no hemorrhage, hydrocephalus, mass lesion, acute infarction, or significant intracranial injury.  No acute calvarial abnormality.  Mucosal thickening in the left maxillary sinus.  Short air fluid level in the right maxillary sinus.  Postoperative changes in the maxillary sinuses bilaterally. Mastoid air cells are clear.  IMPRESSION: No acute intracranial abnormality.   Original Report Authenticated By: Rolm Baptise, M.D.   ECG shows sinus rhythm, with PVCs, HR, 90, no inverstion of T waves, no pathologic ST wave changes.   MDM  Pt without any neuro deficits currently. Had episode of ataxia earlier in the day. Pt at baseline with very good ADLS. She had no HA, blurry vision, but her episode of ataxia is concerning.   CT head did not show acute pathology. Have discussed pt  w/ neuro team, who state pt will need TIA workup. Pt is admitted to hospitalist team.   1. Weakness   2. Ataxia   3. HTN (hypertension)   4. TIA (transient ischemic attack)             Marzetta Board, MD 06/24/12 2327

## 2012-06-24 NOTE — Consult Note (Signed)
Referring Physician: ED    Chief Complaint: ataxia.  HPI:                                                                                                                                         [name] [name] is an 79 y.o. female, right handed, with a past medical history significant for hypertension, hyperlipidemia, arthritis, who was in her usual state of health until earlier today when she developed abrupt onset of extreme unsteadiness that almost make her fall. She said she was walking and was so off balance " and fortunately I hit my car and this prevented me from falling". Stated that her head was " not right and I felt very lightheaded'. No associated headache, vertigo, double vision, slurred speech, focal weakness or numbness, confusion. The episode lasted for several minutes and completely went away. [name] indicated that she had a very similar episode several months ago " but I just ignored it". CT brain in the ED was unremarkable for acute abnormality. She said that she is allergic to aspirin.  Date last known well: 06/24/12 Time last known well: uncertain. tPA Given: no, complete resolution of symptoms.  Past Medical History  Diagnosis Date  . Arthritis   . Hyperlipidemia   . Hypertension   . Lipoma     left arm    Past Surgical History  Procedure Laterality Date  . Rotator cuff repair  09/27/2003  . Cholecystectomy  02/27/04  . Trigger finger release  10/29/06  . Incontinence surgery  09/12/09    Family History  Problem Relation Age of Onset  . Cancer Maternal Grandmother     ovarian   Social History:  reports that she has never smoked. She does not have any smokeless tobacco history on file. She reports that she does not drink alcohol or use illicit drugs.  Allergies:  Allergies  Allergen Reactions  . Aspirin Anaphylaxis  . Ceclor (Cefaclor) Anaphylaxis    Medications:                                                                                                                            I have reviewed the patient's current medications.  ROS:  History obtained from the patient and chart review.  General ROS: negative for - chills, fatigue, fever, night sweats, weight gain or weight loss Psychological ROS: negative for - behavioral disorder, hallucinations, memory difficulties, mood swings or suicidal ideation Ophthalmic ROS: negative for - blurry vision, double vision, eye pain or loss of vision ENT ROS: negative for - epistaxis, nasal discharge, oral lesions, sore throat, tinnitus or vertigo Allergy and Immunology ROS: negative for - hives or itchy/watery eyes Hematological and Lymphatic ROS: negative for - bleeding problems, bruising or swollen lymph nodes Endocrine ROS: negative for - galactorrhea, hair pattern changes, polydipsia/polyuria or temperature intolerance Respiratory ROS: negative for - cough, hemoptysis, shortness of breath or wheezing Cardiovascular ROS: negative for - chest pain, dyspnea on exertion, edema or irregular heartbeat Gastrointestinal ROS: negative for - abdominal pain, diarrhea, hematemesis, nausea/vomiting or stool incontinence Genito-Urinary ROS: negative for - dysuria, hematuria, incontinence or urinary frequency/urgency Musculoskeletal ROS: negative for - joint swelling or muscular weakness Neurological ROS: as noted in HPI Dermatological ROS: negative for rash and skin lesion changes   Physical exam: pleasant female in no apparent distress. Blood pressure 139/87, pulse 68, temperature 97.8 F (36.6 C), resp. rate 12, SpO2 99.00%. Head: normocephalic. Neck: supple, no bruits, no JVD. Cardiac: no murmurs. Lungs: clear. Abdomen: soft, no tender, no mass. Extremities: no edema.    Neurologic Examination:                                                                                                       Mental Status: Alert, awake,oriented x 4, thought content appropriate. Comprehension, naming, and repetition intact.  Speech fluent without evidence of aphasia.  Able to follow 3 step commands without difficulty. Cranial Nerves: II: Discs flat bilaterally; Visual fields grossly normal, pupils equal, round, reactive to light and accommodation III,IV, VI: ptosis not present, extra-ocular motions intact bilaterally V,VII: smile symmetric, facial light touch sensation normal bilaterally VIII: hearing normal bilaterally IX,X: gag reflex present XI: bilateral shoulder shrug XII: midline tongue extension Motor: Right : Upper extremity   5/5    Left:     Upper extremity   5/5  Lower extremity   5/5     Lower extremity   5/5 Tone and bulk:normal tone throughout; no atrophy noted Sensory: Pinprick and light touch intact throughout, bilaterally Deep Tendon Reflexes: 2+ and symmetric throughout Plantars: Right: downgoing   Left: downgoing Cerebellar: normal finger-to-nose,  normal heel-to-shin test Gait:  No ataxia. CV: pulses palpable throughout    Results for orders placed during the hospital encounter of 06/24/12 (from the past 48 hour(s))  CBC WITH DIFFERENTIAL     Status: None   Collection Time    06/24/12  2:21 PM      Result Value Range   WBC 7.4  4.0 - 10.5 K/uL   RBC 4.38  3.87 - 5.11 MIL/uL   Hemoglobin 13.3  12.0 - 15.0 g/dL   HCT 39.3  36.0 - 46.0 %   MCV 89.7  78.0 - 100.0 fL   MCH 30.4  26.0 - 34.0 pg  MCHC 33.8  30.0 - 36.0 g/dL   RDW 13.2  11.5 - 15.5 %   Platelets 234  150 - 400 K/uL   Neutrophils Relative % 60  43 - 77 %   Neutro Abs 4.4  1.7 - 7.7 K/uL   Lymphocytes Relative 31  12 - 46 %   Lymphs Abs 2.3  0.7 - 4.0 K/uL   Monocytes Relative 7  3 - 12 %   Monocytes Absolute 0.5  0.1 - 1.0 K/uL   Eosinophils Relative 2  0 - 5 %   Eosinophils Absolute 0.1  0.0 - 0.7 K/uL   Basophils Relative 0  0 - 1 %   Basophils Absolute 0.0  0.0 -  0.1 K/uL  COMPREHENSIVE METABOLIC PANEL     Status: Abnormal   Collection Time    06/24/12  2:21 PM      Result Value Range   Sodium 138  135 - 145 mEq/L   Potassium 3.4 (*) 3.5 - 5.1 mEq/L   Chloride 100  96 - 112 mEq/L   CO2 29  19 - 32 mEq/L   Glucose, Bld 96  70 - 99 mg/dL   BUN 17  6 - 23 mg/dL   Creatinine, Ser 1.13 (*) 0.50 - 1.10 mg/dL   Calcium 10.5  8.4 - 10.5 mg/dL   Total Protein 7.4  6.0 - 8.3 g/dL   Albumin 3.6  3.5 - 5.2 g/dL   AST 50 (*) 0 - 37 U/L   ALT 44 (*) 0 - 35 U/L   Alkaline Phosphatase 278 (*) 39 - 117 U/L   Total Bilirubin 0.6  0.3 - 1.2 mg/dL   GFR calc non Af Amer 45 (*) >90 mL/min   GFR calc Af Amer 53 (*) >90 mL/min   Comment:            The eGFR has been calculated     using the CKD EPI equation.     This calculation has not been     validated in all clinical     situations.     eGFR's persistently     <90 mL/min signify     possible Chronic Kidney Disease.  POCT I-STAT TROPONIN I     Status: None   Collection Time    06/24/12  3:22 PM      Result Value Range   Troponin i, poc 0.00  0.00 - 0.08 ng/mL   Comment 3            Comment: Due to the release kinetics of cTnI,     a negative result within the first hours     of the onset of symptoms does not rule out     myocardial infarction with certainty.     If myocardial infarction is still suspected,     repeat the test at appropriate intervals.   Ct Head Wo Contrast  06/24/2012   *RADIOLOGY REPORT*  Clinical Data: Syncope.  Evaluate for stroke.  CT HEAD WITHOUT CONTRAST  Technique:  Contiguous axial images were obtained from the base of the skull through the vertex without contrast.  Comparison: MRI 02/25/2008  Findings: Mild age related volume loss. No acute intracranial abnormality.  Specifically, no hemorrhage, hydrocephalus, mass lesion, acute infarction, or significant intracranial injury.  No acute calvarial abnormality.  Mucosal thickening in the left maxillary sinus.  Short air fluid  level in the right maxillary sinus.  Postoperative changes in the maxillary sinuses bilaterally. Mastoid  air cells are clear.  IMPRESSION: No acute intracranial abnormality.   Original Report Authenticated By: Rolm Baptise, M.D.     Assessment: 79 y.o. female with acute onset isolated ataxia that lasted for several minutes and is now resolved. Neuro-exam unremarkable at this moment. Possible posterior circulation TIA. Admit to medicine for TIA work up.  Stroke Risk Factors - age, HTN, hyperlipidemia. Plan: 1. HgbA1c, fasting lipid panel 2. MRI, MRA  of the brain without contrast 3. Echocardiogram 4. Carotid dopplers 5. Prophylactic therapy-plavix 75 mg daily. 6. Statins. 7. Risk factor modification 8. Telemetry monitoring 9. Frequent neuro checks   Dorian Pod, MD [facility] [phone]  06/24/2012, 6:44 PM

## 2012-06-24 NOTE — ED Notes (Signed)
Dr. Lysle Rubens at the bedside.

## 2012-06-24 NOTE — ED Notes (Signed)
Family at bedside. 

## 2012-06-24 NOTE — ED Notes (Signed)
States had been working in her shed and felt faint and stumbling almost fell caught her self and just does not feel right. States has had a crackling in herrt ear for a few days also

## 2012-06-24 NOTE — ED Notes (Signed)
Bed assignment change to 3 west unit .

## 2012-06-24 NOTE — H&P (Signed)
[facility]          History and Physical    PCP:   FULP, CAMMIE, MD   Chief Complaint:  Extreme gait imbalance  HPI: Patient is a pleasant 79 year old white woman with past medical history only significant for hypertension who is remarkably physically active for her age. She states this morning she was in her back patio attending to some flowers when she stood up she suddenly became very lightheaded and felt like her body was veering to the right. Fortunately she bumped into her car and was able to hold onto the hood of her car which prevented her falling. She never lost consciousness. She states this episode lasted about 30 seconds to 1 minute. She called her primary care physician who advised her to come to the hospital for evaluation.  Allergies:   Allergies  Allergen Reactions  . Aspirin Anaphylaxis  . Ceclor (Cefaclor) Anaphylaxis      Past Medical History  Diagnosis Date  . Arthritis   . Hyperlipidemia   . Hypertension   . Lipoma     left arm    Past Surgical History  Procedure Laterality Date  . Rotator cuff repair  09/27/2003  . Cholecystectomy  02/27/04  . Trigger finger release  10/29/06  . Incontinence surgery  09/12/09    Prior to Admission medications   Medication Sig Start Date End Date Taking? Authorizing Provider  CALCIUM PO Take 1,000 mg by mouth daily.   Yes Historical Provider, MD  cetirizine (ZYRTEC) 10 MG tablet Take 10 mg by mouth daily.   Yes Historical Provider, MD  Cholecalciferol (VITAMIN D PO) Take 1 tablet by mouth daily.   Yes Historical Provider, MD  ezetimibe (ZETIA) 10 MG tablet Take 10 mg by mouth daily.   Yes Historical Provider, MD  hydrochlorothiazide (HYDRODIURIL) 25 MG tablet Take 25 mg by mouth daily.   Yes Historical Provider, MD  metoprolol tartrate (LOPRESSOR) 25 MG tablet Take 25 mg by mouth daily.   Yes Historical Provider, MD  mometasone (NASONEX) 50 MCG/ACT nasal spray Place 2 sprays into the nose daily.   Yes  Historical Provider, MD  Multiple Vitamin (MULTIVITAMIN) capsule Take 1 capsule by mouth daily.   Yes Historical Provider, MD  VITAMIN E PO Take 2 tablets by mouth daily.    Yes Historical Provider, MD    Social History:  reports that she has never smoked. She does not have any smokeless tobacco history on file. She reports that she does not drink alcohol or use illicit drugs.  Family History  Problem Relation Age of Onset  . Cancer Maternal Grandmother     ovarian    Review of Systems:  Constitutional: Denies fever, chills, diaphoresis, appetite change and fatigue.  HEENT: Denies photophobia, eye pain, redness, hearing loss, ear pain, congestion, sore throat, rhinorrhea, sneezing, mouth sores, trouble swallowing, neck pain, neck stiffness and tinnitus.   Respiratory: Denies SOB, DOE, cough, chest tightness,  and wheezing.   Cardiovascular: Denies chest pain, palpitations and leg swelling.  Gastrointestinal: Denies nausea, vomiting, abdominal pain, diarrhea, constipation, blood in stool and abdominal distention.  Genitourinary: Denies dysuria, urgency, frequency, hematuria, flank pain and difficulty urinating.  Endocrine: Denies: hot or cold intolerance, sweats, changes in hair or nails, polyuria, polydipsia. Musculoskeletal: Denies myalgias, back pain, joint swelling, arthralgias and gait problem.  Skin: Denies pallor, rash and wound.  Neurological: Denies dizziness, seizures, syncope, weakness, numbness and headaches.  Hematological: Denies adenopathy. Easy bruising, personal or family bleeding  history  Psychiatric/Behavioral: Denies suicidal ideation, mood changes, confusion, nervousness, sleep disturbance and agitation   Physical Exam: Blood pressure 149/66, pulse 91, temperature 97.8 F (36.6 C), resp. rate 12, SpO2 99.00%. General: Alert, awake, oriented x3, in no current distress. HEENT: Normocephalic, atraumatic, pupils equal round and reactive to light, extraocular movements  intact, wears corrective lenses. Neck: Supple, no JVD, no lymphadenopathy, no bruits, no goiter. Cardiovascular: Regular rate and rhythm, no murmurs, rubs or gallops. Lungs: Clear to auscultation bilaterally. Abdomen: Soft, nontender, nondistended, positive bowel sounds, no masses or organomegaly noted. Extremities: No clubbing, cyanosis or edema, positive pedal pulses. Neurologic: Intact and nonfocal. I have not ambulated her.  Labs on Admission:  Results for orders placed during the hospital encounter of 06/24/12 (from the past 48 hour(s))  CBC WITH DIFFERENTIAL     Status: None   Collection Time    06/24/12  2:21 PM      Result Value Range   WBC 7.4  4.0 - 10.5 K/uL   RBC 4.38  3.87 - 5.11 MIL/uL   Hemoglobin 13.3  12.0 - 15.0 g/dL   HCT 39.3  36.0 - 46.0 %   MCV 89.7  78.0 - 100.0 fL   MCH 30.4  26.0 - 34.0 pg   MCHC 33.8  30.0 - 36.0 g/dL   RDW 13.2  11.5 - 15.5 %   Platelets 234  150 - 400 K/uL   Neutrophils Relative % 60  43 - 77 %   Neutro Abs 4.4  1.7 - 7.7 K/uL   Lymphocytes Relative 31  12 - 46 %   Lymphs Abs 2.3  0.7 - 4.0 K/uL   Monocytes Relative 7  3 - 12 %   Monocytes Absolute 0.5  0.1 - 1.0 K/uL   Eosinophils Relative 2  0 - 5 %   Eosinophils Absolute 0.1  0.0 - 0.7 K/uL   Basophils Relative 0  0 - 1 %   Basophils Absolute 0.0  0.0 - 0.1 K/uL  COMPREHENSIVE METABOLIC PANEL     Status: Abnormal   Collection Time    06/24/12  2:21 PM      Result Value Range   Sodium 138  135 - 145 mEq/L   Potassium 3.4 (*) 3.5 - 5.1 mEq/L   Chloride 100  96 - 112 mEq/L   CO2 29  19 - 32 mEq/L   Glucose, Bld 96  70 - 99 mg/dL   BUN 17  6 - 23 mg/dL   Creatinine, Ser 1.13 (*) 0.50 - 1.10 mg/dL   Calcium 10.5  8.4 - 10.5 mg/dL   Total Protein 7.4  6.0 - 8.3 g/dL   Albumin 3.6  3.5 - 5.2 g/dL   AST 50 (*) 0 - 37 U/L   ALT 44 (*) 0 - 35 U/L   Alkaline Phosphatase 278 (*) 39 - 117 U/L   Total Bilirubin 0.6  0.3 - 1.2 mg/dL   GFR calc non Af Amer 45 (*) >90 mL/min   GFR  calc Af Amer 53 (*) >90 mL/min   Comment:            The eGFR has been calculated     using the CKD EPI equation.     This calculation has not been     validated in all clinical     situations.     eGFR's persistently     <90 mL/min signify     possible Chronic Kidney Disease.  POCT  I-STAT TROPONIN I     Status: None   Collection Time    06/24/12  3:22 PM      Result Value Range   Troponin i, poc 0.00  0.00 - 0.08 ng/mL   Comment 3            Comment: Due to the release kinetics of cTnI,     a negative result within the first hours     of the onset of symptoms does not rule out     myocardial infarction with certainty.     If myocardial infarction is still suspected,     repeat the test at appropriate intervals.    Radiological Exams on Admission: Ct Head Wo Contrast  06/24/2012   *RADIOLOGY REPORT*  Clinical Data: Syncope.  Evaluate for stroke.  CT HEAD WITHOUT CONTRAST  Technique:  Contiguous axial images were obtained from the base of the skull through the vertex without contrast.  Comparison: MRI 02/25/2008  Findings: Mild age related volume loss. No acute intracranial abnormality.  Specifically, no hemorrhage, hydrocephalus, mass lesion, acute infarction, or significant intracranial injury.  No acute calvarial abnormality.  Mucosal thickening in the left maxillary sinus.  Short air fluid level in the right maxillary sinus.  Postoperative changes in the maxillary sinuses bilaterally. Mastoid air cells are clear.  IMPRESSION: No acute intracranial abnormality.   Original Report Authenticated By: Rolm Baptise, M.D.    Assessment/Plan Principal Problem:   TIA (transient ischemic attack) Active Problems:   HTN (hypertension)   Possible posterior circulation TIA -Agree with admission as an observation to a telemetry unit. -She cannot take aspirin as she has an allergy to it. We'll place on Plavix. -TIA workup to include MRI of the brain, 2-D echo, carotid Dopplers, fasting lipid  profile, PT/OT evaluations. -Appreciate neurology consultation.  Hypertension -Currently well-controlled. -Continue home doses of hydrochlorothiazide and metoprolol. -Of note, no orthostatic hypotension with her presenting symptoms.  DVT prophylaxis -Lovenox.   Time Spent on Admission: 75 minutes  HERNANDEZ ACOSTA,ESTELA [facility] Pager: [phone] 06/24/2012, 7:09 PM

## 2012-06-25 ENCOUNTER — Observation Stay (HOSPITAL_COMMUNITY): Payer: Federal, State, Local not specified - PPO

## 2012-06-25 LAB — LIPID PANEL
Cholesterol: 194 mg/dL (ref 0–200)
HDL: 56 mg/dL (ref >39)
Total CHOL/HDL Ratio: 3.5 RATIO
VLDL: 20 mg/dL (ref 0–40)

## 2012-06-25 LAB — HEMOGLOBIN A1C
Hgb A1c MFr Bld: 5.6 % (ref <5.7)
Mean Plasma Glucose: 114 mg/dL (ref <117)

## 2012-06-25 MED ORDER — CLOPIDOGREL BISULFATE 75 MG PO TABS: 75.0000 mg | ORAL_TABLET | Freq: Every day | ORAL | Status: DC

## 2012-06-25 MED ORDER — STROKE: EARLY STAGES OF RECOVERY BOOK
Freq: Once | Status: AC
  Administered 2012-06-25: 16:00:00
  Filled 2012-06-25: qty 1

## 2012-06-25 NOTE — Care Management Note (Signed)
    Page 1 of 1   06/25/2012     3:24:22 PM   CARE MANAGEMENT NOTE 06/25/2012  Patient:  [name] [name], [name] [name]   Account Number:  [id number]  Date Initiated:  06/25/2012  Documentation initiated by:  GRAVES-BIGELOW,Hedaya Latendresse  Subjective/Objective Assessment:   Pt admitted with near syncope and plans to have outpatient PT services at [facility].     Action/Plan:   Cm will fax inofrmation to Rehab and they will call pt with time that is available for pt. No further needs from CM at this time.   Anticipated DC Date:  06/25/2012   Anticipated DC Plan:  [place]  CM consult      Choice offered to / List presented to:             Status of service:  Completed, signed off Medicare Important Message given?   (If response is "NO", the following Medicare IM given date fields will be blank) Date Medicare IM given:   Date Additional Medicare IM given:    Discharge Disposition:  HOME/SELF CARE  Per UR Regulation:  Reviewed for med. necessity/level of care/duration of stay  If discussed at [place] of Stay Meetings, dates discussed:    Comments:

## 2012-06-25 NOTE — Progress Notes (Signed)
UR Completed Brinly Maietta Graves-Bigelow, RN,BSN [phone]  

## 2012-06-25 NOTE — Progress Notes (Signed)
Physical Therapy Evaluation Patient Details Name: [name] [name] MRN: [id number] DOB: 01-04-1932 Today's Date: 06/25/2012 Time: 1135-1208 PT Time Calculation (min): 33 min  PT Assessment / Plan / Recommendation Clinical Impression  Pt s/p TIA/vertigo with decr mobility secondary to occasional vertigo and slight weakness from being in hospital.  PT addressed vertigo and performed Epley maneuver and BBQ roll with success per pt.  Gave pt handout re: BPPV and a f/u exercise.  Pt needs a prescription for Outpt. PT f/u for vestibular rehab.  If pt still in hospital, PT will f/u in am to ensure that BPPV continues to resolve.      PT Assessment  Patient needs continued PT services    Follow Up Recommendations  Outpatient PT;Other (comment) (for vestibular rehab)                Equipment Recommendations  None recommended by PT         Frequency Min 3X/week    Precautions / Restrictions Precautions Precautions: None Restrictions Weight Bearing Restrictions: No   Pertinent Vitals/Pain VSS, no pain      Mobility  Bed Mobility Bed Mobility: Rolling Right;Rolling Left;Right Sidelying to Sit;Left Sidelying to Sit;Sitting - Scoot to [employer] of Bed Rolling Right: 7: Independent Rolling Left: 7: Independent Right Sidelying to Sit: 7: Independent Left Sidelying to Sit: 7: Independent Sitting - Scoot to Edge of Bed: 7: Independent Details for Bed Mobility Assistance: Pt with 7/10 dizziness initially with testing and when in hall.  Pt with positive left posterior canal hallpike dix test.  Performed Epley manuever for left BPPV.  Ambulated pt after that and pt had episode in hall of dizziness.  Came back to room and tested with supine head roll and pt positive for left horizontal canal BPPV therefore, treated with BBQ roll.  After all treatments performed pt with 0/10 dizziness.   Transfers Transfers: Sit to Stand;Stand to Sit Sit to Stand: 7: Independent Stand to Sit: To bed;7:  Independent Ambulation/Gait Ambulation/Gait Assistance: 7: Independent Ambulation Distance (Feet): 500 Feet Assistive device: None Ambulation/Gait Assistance Details: No LOB with challenges.  Occasional veering to right which pt self corrected initially on initial walk.  However when PT walked pt after second BPPV treatment pt had no veering and even stated that she felt more steady.   Gait Pattern: Within Functional Limits Stairs: Yes Stairs Assistance: 6: Modified independent (Device/Increase time) Stair Management Technique: One rail Right;Forwards;Alternating pattern Number of Stairs: 10 Wheelchair Mobility Wheelchair Mobility: No Modified Rankin (Stroke Patients Only) Pre-Morbid Rankin Score: No symptoms Modified Rankin: No significant disability         PT Diagnosis:  (dizziness)  PT Problem List: Decreased activity tolerance;Decreased balance;Decreased mobility (dizziness) PT Treatment Interventions: Gait training;Patient/family education (canalith repositioning maneuver)   PT Goals Acute Rehab PT Goals PT Goal Formulation: With patient Time For Goal Achievement: 07/01/12 Potential to Achieve Goals: Good Pt will Ambulate: >150 feet;with least restrictive assistive device;Independently PT Goal: Ambulate - Progress: Goal set today Additional Goals Additional Goal #1: Pt will have a negative hallpike dix test bil. PT Goal: Additional Goal #1 - Progress: Goal set today Additional Goal #2: pt will have a negative supine head roll bil.  PT Goal: Additional Goal #2 - Progress: Goal set today  Visit Information  Last PT Received On: 06/25/12 Assistance Needed: +1 PT/OT Co-Evaluation/Treatment: Yes    Subjective Data  Subjective: "I just get this feeling." Patient Stated Goal: To go home   Prior [place]  Living Lives With: Spouse Available Help at Discharge: Family;Available 24 hours/day Type of Home: House Home Access: Level entry Home Layout: Two level;Able  to live on main level with bedroom/bathroom Bathroom Shower/Tub: Tub/shower unit;Walk-in shower Bathroom Toilet: Standard Home Adaptive Equipment: None Prior Function Level of Independence: Independent Able to Take Stairs?: Yes Driving: Yes Communication Communication: No difficulties Dominant Hand: Right    Cognition  Cognition Arousal/Alertness: Awake/alert Behavior During Therapy: WFL for tasks assessed/performed Overall Cognitive Status: Within Functional Limits for tasks assessed    Extremity/Trunk Assessment Right Upper Extremity Assessment RUE ROM/Strength/Tone: Within functional levels Left Upper Extremity Assessment LUE ROM/Strength/Tone: Within functional levels Right Lower Extremity Assessment RLE ROM/Strength/Tone: WFL for tasks assessed Left Lower Extremity Assessment LLE ROM/Strength/Tone: WFL for tasks assessed Trunk Assessment Trunk Assessment: Normal   Balance Balance Balance Assessed: Yes Static Sitting Balance Static Sitting - Balance Support: No upper extremity supported;Feet supported Static Sitting - Level of Assistance: 7: Independent Static Standing Balance Static Standing - Balance Support: No upper extremity supported Static Standing - Level of Assistance: 7: Independent Dynamic Standing Balance Dynamic Standing - Balance Support: No upper extremity supported;During functional activity Dynamic Standing - Level of Assistance: 6: Modified independent (Device/Increase time) Dynamic Standing - Balance Activities: Lateral lean/weight shifting;Forward lean/weight shifting;Reaching for objects Dynamic Standing - Comments: Mod I for slightly increased time when reaching to open low drawer and to reach across bed.  End of Session PT - End of Session Equipment Utilized During Treatment: Gait belt Activity Tolerance: Patient tolerated treatment well Patient left: in chair;with call bell/phone within reach Nurse Communication: Mobility status   GP Functional Assessment Tool Used: clinical judgment Functional Limitation: Mobility: Walking and moving around Mobility: Walking and Moving Around Current Status [id number]): At least 1 percent but less than 20 percent impaired, limited or restricted Mobility: Walking and Moving Around Goal Status [phone]): 0 percent impaired, limited or restricted   [name],[name] Sivertsen 06/25/2012, 2:07 PM [facility] [phone] (pager)

## 2012-06-25 NOTE — Progress Notes (Signed)
*  PRELIMINARY RESULTS* Vascular Ultrasound Carotid Duplex (Doppler) has been completed.  Preliminary findings: Bilaterally 0-39% ICA stenosis. Antegrade vertebral flow.  Landry Mellow, RDMS, RVT  06/25/2012, 8:33 AM

## 2012-06-25 NOTE — Discharge Summary (Signed)
Physician Discharge Summary  [name] [name] [id number] DOB: 07-07-1931 DOA: 06/24/2012  PCP: Antony Blackbird, MD  Admit date: 06/24/2012 Discharge date: 06/25/2012  Time spent: Greater than 30 minutes  Recommendations for Outpatient Follow-up:  -Advised to followup with primary care physician in 2 weeks. -Will be set up with vestibular therapy on discharge.   Discharge Diagnoses:  Principal Problem:   TIA (transient ischemic attack) Active Problems:   HTN (hypertension)   Discharge Condition: Stable and improved  Filed Weights   06/24/12 2046 06/25/12 0000  Weight: 62.687 kg (138 lb 3.2 oz) 62.45 kg (137 lb 10.8 oz)    History of present illness:  Patient is a 79 year old white woman with past medical history only significant for hypertension who is remarkably physically active for her age. She states this morning she was in her back patio attending to some flowers when she stood up she suddenly became very lightheaded and felt like her body was veering to the right. Fortunately she bumped into her car and was able to hold onto the hood of her car which prevented her falling. She never lost consciousness. She states this episode lasted about 30 seconds to 1 minute. She called her primary care physician who advised her to come to the hospital for evaluation.   Hospital Course:   Lightheadedness/gait imbalance -Likely a posterior circulation TIA equivalent versus testicular in origin. -MRI negative for acute infarct. -2-D echo/carotid Dopplers within normal limits. -Has been started on Plavix for secondary stroke prevention (she is allergic to aspirin) -PT OT recommends vestibular training which will be scheduled on discharge.  Hypertension -Well-controlled on home doses of hydrochlorothiazide and metoprolol.   Procedures:  2-D echo  Carotid Dopplers   Consultations:  Neurology, Dr. Leonie Man  Discharge Instructions  Discharge Orders   Future Orders Complete By Expires      Diet - low sodium heart healthy  As directed     Discontinue IV  As directed     Increase activity slowly  As directed         Medication List    TAKE these medications       CALCIUM PO  Take 1,000 mg by mouth daily.     cetirizine 10 MG tablet  Commonly known as:  ZYRTEC  Take 10 mg by mouth daily.     clopidogrel 75 MG tablet  Commonly known as:  PLAVIX  Take 1 tablet (75 mg total) by mouth daily with breakfast.     ezetimibe 10 MG tablet  Commonly known as:  ZETIA  Take 10 mg by mouth daily.     hydrochlorothiazide 25 MG tablet  Commonly known as:  HYDRODIURIL  Take 25 mg by mouth daily.     metoprolol tartrate 25 MG tablet  Commonly known as:  LOPRESSOR  Take 25 mg by mouth daily.     mometasone 50 MCG/ACT nasal spray  Commonly known as:  NASONEX  Place 2 sprays into the nose daily.     multivitamin capsule  Take 1 capsule by mouth daily.     VITAMIN D PO  Take 1 tablet by mouth daily.     VITAMIN E PO  Take 2 tablets by mouth daily.       Allergies  Allergen Reactions  . Aspirin Anaphylaxis  . Ceclor (Cefaclor) Anaphylaxis       Follow-up Information   Follow up with FULP, CAMMIE, MD. Schedule an appointment as soon as possible for a visit in 2 weeks.  Contact information:   [place], [place] [phone]        The results of significant diagnostics from this hospitalization (including imaging, microbiology, ancillary and laboratory) are listed below for reference.    Significant Diagnostic Studies: Ct Head Wo Contrast  06/24/2012   *RADIOLOGY REPORT*  Clinical Data: Syncope.  Evaluate for stroke.  CT HEAD WITHOUT CONTRAST  Technique:  Contiguous axial images were obtained from the base of the skull through the vertex without contrast.  Comparison: MRI 02/25/2008  Findings: Mild age related volume loss. No acute intracranial abnormality.  Specifically, no hemorrhage, hydrocephalus, mass lesion, acute  infarction, or significant intracranial injury.  No acute calvarial abnormality.  Mucosal thickening in the left maxillary sinus.  Short air fluid level in the right maxillary sinus.  Postoperative changes in the maxillary sinuses bilaterally. Mastoid air cells are clear.  IMPRESSION: No acute intracranial abnormality.   Original Report Authenticated By: Rolm Baptise, M.D.   Mri Brain Without Contrast  06/25/2012   *RADIOLOGY REPORT*  Clinical Data:  Episode of dizziness with weakness and near fall yesterday.  Hypertension and hyperlipidemia.  MRI BRAIN WITHOUT CONTRAST MRA HEAD WITHOUT CONTRAST  Technique: Multiplanar, multiecho pulse sequences of the brain and surrounding structures were obtained according to standard protocol without intravenous contrast.  Angiographic images of the head were obtained using MRA technique without contrast.  Comparison: 06/24/2012 CT.  02/25/2008 MR.  MRI HEAD  Findings:  No acute infarct.  No intracranial hemorrhage.  Moderate small vessel disease type changes.  Global atrophy without hydrocephalus.  Major intracranial vascular structures are patent.  No intracranial mass lesion detected on this unenhanced exam.  Paranasal sinus mucosal thickening most notable involving the left maxillary sinus and the left sphenoid sinus.  Prior sinus surgery.  Cervical spondylotic changes C3-4 and C4-5.  Cervical medullary junction, pituitary region and pineal region as well as orbital structures unremarkable.  IMPRESSION: No acute infarct.  Moderate small vessel disease type changes.  Global atrophy without hydrocephalus.  Paranasal sinus mucosal thickening most notable involving the left maxillary sinus and the left sphenoid sinus.  MRA HEAD  Findings: Moderate narrowing A1 segment left anterior cerebral artery.  Anterior circulation otherwise without evidence of medium or large size vessel significant stenosis or occlusion.  Middle cerebral artery branch vessel irregularity bilaterally.   Codominant vertebral arteries.  No significant stenosis of the vertebral arteries or basilar artery.  Poor delineation of the left AICA.  Moderate tandem stenosis right AICA and both superior cerebellar arteries.  No aneurysm or vascular malformation noted.  IMPRESSION: Mild intracranial atherosclerotic type changes as detailed above.   Original Report Authenticated By: Genia Del, M.D.   Mr Mra Head/brain Wo Cm  06/25/2012   *RADIOLOGY REPORT*  Clinical Data:  Episode of dizziness with weakness and near fall yesterday.  Hypertension and hyperlipidemia.  MRI BRAIN WITHOUT CONTRAST MRA HEAD WITHOUT CONTRAST  Technique: Multiplanar, multiecho pulse sequences of the brain and surrounding structures were obtained according to standard protocol without intravenous contrast.  Angiographic images of the head were obtained using MRA technique without contrast.  Comparison: 06/24/2012 CT.  02/25/2008 MR.  MRI HEAD  Findings:  No acute infarct.  No intracranial hemorrhage.  Moderate small vessel disease type changes.  Global atrophy without hydrocephalus.  Major intracranial vascular structures are patent.  No intracranial mass lesion detected on this unenhanced exam.  Paranasal sinus mucosal thickening most notable involving the left maxillary sinus and the left sphenoid  sinus.  Prior sinus surgery.  Cervical spondylotic changes C3-4 and C4-5.  Cervical medullary junction, pituitary region and pineal region as well as orbital structures unremarkable.  IMPRESSION: No acute infarct.  Moderate small vessel disease type changes.  Global atrophy without hydrocephalus.  Paranasal sinus mucosal thickening most notable involving the left maxillary sinus and the left sphenoid sinus.  MRA HEAD  Findings: Moderate narrowing A1 segment left anterior cerebral artery.  Anterior circulation otherwise without evidence of medium or large size vessel significant stenosis or occlusion.  Middle cerebral artery branch vessel irregularity  bilaterally.  Codominant vertebral arteries.  No significant stenosis of the vertebral arteries or basilar artery.  Poor delineation of the left AICA.  Moderate tandem stenosis right AICA and both superior cerebellar arteries.  No aneurysm or vascular malformation noted.  IMPRESSION: Mild intracranial atherosclerotic type changes as detailed above.   Original Report Authenticated By: Genia Del, M.D.    Microbiology: No results found for this or any previous visit (from the past 240 hour(s)).   Labs: Basic Metabolic Panel:  Recent Labs Lab 06/24/12 1421  NA 138  K 3.4*  CL 100  CO2 29  GLUCOSE 96  BUN 17  CREATININE 1.13*  CALCIUM 10.5   Liver Function Tests:  Recent Labs Lab 06/24/12 1421  AST 50*  ALT 44*  ALKPHOS 278*  BILITOT 0.6  PROT 7.4  ALBUMIN 3.6   No results found for this basename: LIPASE, AMYLASE,  in the last 168 hours No results found for this basename: AMMONIA,  in the last 168 hours CBC:  Recent Labs Lab 06/24/12 1421  WBC 7.4  NEUTROABS 4.4  HGB 13.3  HCT 39.3  MCV 89.7  PLT 234   Cardiac Enzymes: No results found for this basename: CKTOTAL, CKMB, CKMBINDEX, TROPONINI,  in the last 168 hours BNP: BNP (last 3 results) No results found for this basename: PROBNP,  in the last 8760 hours CBG:  Recent Labs Lab 06/24/12 2122  GLUCAP 101*       Signed:  HERNANDEZ ACOSTA,ESTELA  [facility] Pager: [phone] 06/25/2012, 4:09 PM

## 2012-06-25 NOTE — Evaluation (Addendum)
Occupational Therapy Evaluation Patient Details Name: [name] [name] MRN: [id number] DOB: 12/08/1931 Today's Date: 06/25/2012 Time: [id number] OT Time Calculation (min): 10 min  OT Assessment / Plan / Recommendation Clinical Impression  Pt admitted s/p episode of sudden unsteadiness.  Work up underway. Pt at baseline with performing ADLs/functional mobility (mod I/I). Pt with some c/o dizziness (PT to address).  No further acute OT needs. Will sign off.    OT Assessment  Patient does not need any further OT services    Follow Up Recommendations  No OT follow up    Barriers to Discharge      Equipment Recommendations  None recommended by OT    Recommendations for Other Services    Frequency       Precautions / Restrictions     Pertinent Vitals/Pain See vitals    ADL  Upper Body Bathing: Simulated;Independent Where Assessed - Upper Body Bathing: Unsupported sitting Lower Body Bathing: Simulated;Independent Where Assessed - Lower Body Bathing: Unsupported sit to stand Upper Body Dressing: Performed;Independent Where Assessed - Upper Body Dressing: Unsupported standing Lower Body Dressing: Simulated;Independent Where Assessed - Lower Body Dressing: Unsupported sit to stand Toilet Transfer: Simulated;Independent Toilet Transfer Method:  (ambulating) Science writer:  (bed) Transfers/Ambulation Related to ADLs: independent during ambulation in hall ADL Comments: pt at baseline.  Pt with some c/o dizzness when arching neck at times (PT remaining in room to perform vestibular testing).    OT Diagnosis:    OT Problem List:   OT Treatment Interventions:     OT Goals    Visit Information  Last OT Received On: 06/25/12    Subjective Data      Prior Functioning     Home Living Lives With: Spouse Available Help at Discharge: Family;Available 24 hours/day Type of Home: House Home Access: Level entry Home Layout: Two level;Able to live on main level with  bedroom/bathroom Bathroom Shower/Tub: Tub/shower unit;Walk-in shower Bathroom Toilet: Standard Home Adaptive Equipment: None Prior Function Level of Independence: Independent Communication Communication: No difficulties Dominant Hand: Right         Vision/Perception Vision - History Baseline Vision: Wears glasses all the time   Cognition  Cognition Arousal/Alertness: Awake/alert Behavior During Therapy: WFL for tasks assessed/performed Overall Cognitive Status: Within Functional Limits for tasks assessed    Extremity/Trunk Assessment Right Upper Extremity Assessment RUE ROM/Strength/Tone: Within functional levels Left Upper Extremity Assessment LUE ROM/Strength/Tone: Within functional levels     Mobility Bed Mobility Bed Mobility: Not assessed Transfers Transfers: Sit to Stand;Stand to Sit Sit to Stand: 7: Independent;From bed Stand to Sit: 7: Independent;To bed     Exercise     Balance Balance Balance Assessed: Yes Static Sitting Balance Static Sitting - Balance Support: No upper extremity supported;Feet supported Static Sitting - Level of Assistance: 7: Independent Static Standing Balance Static Standing - Balance Support: No upper extremity supported Static Standing - Level of Assistance: 7: Independent Dynamic Standing Balance Dynamic Standing - Balance Support: No upper extremity supported;During functional activity Dynamic Standing - Level of Assistance: 6: Modified independent (Device/Increase time) Dynamic Standing - Balance Activities: Lateral lean/weight shifting;Forward lean/weight shifting;Reaching for objects Dynamic Standing - Comments: Mod I for slightly increased time when reaching to open low drawer and to reach across bed.   End of Session OT - End of Session Activity Tolerance: Patient tolerated treatment well Patient left: Other (comment) (with PT for vestibular testing)  GO Functional Assessment Tool Used: clinical judgement Functional  Limitation: Self care Self Care Current  Status [phone]): 0 percent impaired, limited or restricted Self Care Goal Status [id number]): 0 percent impaired, limited or restricted Self Care Discharge Status [phone]): 0 percent impaired, limited or restricted   06/25/2012 Darrol Jump OTR/L Pager [phone] Office [phone]  Darrol Jump 06/25/2012, 12:06 PM

## 2012-06-25 NOTE — Progress Notes (Signed)
Reviewed discharge instructions with patient and she stated her understanding.  Patient discharge via wheelchair home with daughter.  Sanda Linger

## 2012-06-25 NOTE — ED Provider Notes (Signed)
I saw and evaluated the patient, reviewed the resident's note and I agree with the findings and plan. The patient presents with complaints of two episodes of feeling dizzy, losing her equilibrium, and causing her to "veer to the left".  She had to hold onto the car to stablize herself and prevent a fall.  This episode lasted several minutes.  Her symptoms have now resolved.    On exam, the patient is afebrile and the vitals are stable.  The heart is regular rate and rhythm and the lungs are clear.  The abdomen is benign.  Cranial nerves 2-12 are intact and the strength is symmetrical in the upper and lower extremities.  Finger to nose and heel to shin are normal and there are no other deficits in coordination.  The workup is essentially unremarkable.  The ekg, labs, and ct are negative.  We have spoken with neurology who has recommended admission for tia workup.  [facility] been consulted and arrangements for admission have been made.  Veryl Speak, MD 06/25/12 [phone]
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
## 2012-06-25 NOTE — Progress Notes (Signed)
Echocardiogram 2D Echocardiogram has been performed.  Philipp Deputy 06/25/2012, 10:17 AM

## 2012-06-30 ENCOUNTER — Ambulatory Visit: Payer: Federal, State, Local not specified - PPO | Attending: Family Medicine | Admitting: Physical Therapy

## 2012-06-30 DIAGNOSIS — IMO0001 Reserved for inherently not codable concepts without codable children: Secondary | ICD-10-CM | POA: Insufficient documentation

## 2012-06-30 DIAGNOSIS — H8110 Benign paroxysmal vertigo, unspecified ear: Secondary | ICD-10-CM | POA: Insufficient documentation

## 2012-07-02 ENCOUNTER — Ambulatory Visit: Payer: Federal, State, Local not specified - PPO | Admitting: Physical Therapy

## 2012-07-06 ENCOUNTER — Ambulatory Visit: Payer: Federal, State, Local not specified - PPO | Admitting: Physical Therapy

## 2012-07-10 ENCOUNTER — Ambulatory Visit: Payer: Federal, State, Local not specified - PPO | Admitting: Physical Therapy

## 2012-07-13 ENCOUNTER — Ambulatory Visit: Payer: Federal, State, Local not specified - PPO | Admitting: Physical Therapy

## 2012-07-15 ENCOUNTER — Encounter: Payer: Medicare Other | Admitting: Physical Therapy

## 2012-07-15 ENCOUNTER — Ambulatory Visit: Payer: Federal, State, Local not specified - PPO | Admitting: Physical Therapy

## 2012-07-20 ENCOUNTER — Encounter: Payer: Medicare Other | Admitting: Physical Therapy

## 2012-07-21 ENCOUNTER — Ambulatory Visit: Payer: Federal, State, Local not specified - PPO | Attending: Family Medicine | Admitting: Physical Therapy

## 2012-07-21 DIAGNOSIS — H8110 Benign paroxysmal vertigo, unspecified ear: Secondary | ICD-10-CM | POA: Insufficient documentation

## 2012-07-21 DIAGNOSIS — IMO0001 Reserved for inherently not codable concepts without codable children: Secondary | ICD-10-CM | POA: Insufficient documentation

## 2012-07-22 ENCOUNTER — Encounter: Payer: Medicare Other | Admitting: Physical Therapy

## 2012-07-28 ENCOUNTER — Ambulatory Visit: Payer: Federal, State, Local not specified - PPO | Admitting: Physical Therapy

## 2012-08-05 ENCOUNTER — Ambulatory Visit: Payer: Federal, State, Local not specified - PPO | Admitting: Physical Therapy

## 2012-08-26 ENCOUNTER — Other Ambulatory Visit: Payer: Self-pay

## 2012-11-26 ENCOUNTER — Other Ambulatory Visit: Payer: Self-pay

## 2013-03-04 ENCOUNTER — Encounter: Payer: Self-pay | Admitting: Neurology

## 2013-03-04 ENCOUNTER — Ambulatory Visit (INDEPENDENT_AMBULATORY_CARE_PROVIDER_SITE_OTHER): Payer: Federal, State, Local not specified - PPO | Admitting: Neurology

## 2013-03-04 VITALS — BP 117/81 | HR 91 | Temp 97.3°F | Ht 60.75 in | Wt 135.0 lb

## 2013-03-04 DIAGNOSIS — I951 Orthostatic hypotension: Secondary | ICD-10-CM

## 2013-03-04 DIAGNOSIS — H8110 Benign paroxysmal vertigo, unspecified ear: Secondary | ICD-10-CM

## 2013-03-04 DIAGNOSIS — R42 Dizziness and giddiness: Secondary | ICD-10-CM

## 2013-03-04 NOTE — Progress Notes (Signed)
Subjective:    Patient ID: [name] [name] is a 80 y.o. female.  HPI    Star Age, MD, PhD [facility] [address], [address] [place], [place] [address]   Dear [name] [name],  I saw your patient, [name] [name], upon your kind request in my neurologic clinic today for initial consultation of her intermittent dizziness in the last month. The patient is unaccompanied today. As you know, [name] is a 80 year old right-handed woman with an underlying medical history of hypertension, HLP, recurrent headaches, allergies, asthma, nasal polyps and chronic sinus disease, osteopenia, reflux disease with hiatal hernia, degenerative disc disease in her neck and degenerative back disease with T12 compression fracture in 1998, hyperlipidemia and arthritis, who presented to the emergency room on 06/24/2012 with sudden onset of vertigo. She was in her normal state of health and working on her patio when she suddenly became lightheaded and dizzy and felt that her body was pulling her to the right. She did not fall to the ground and never lost consciousness. The episode lasted for a few minutes. She called her primary care physician and was advised to come to the hospital for further evaluation. In the emergency room the differential diagnosis was felt to be a posterior circulation TIA versus vestibular dizziness. She was admitted for further workup. She was admitted on 06/24/2012 and discharged on 06/25/2012. She did report that she had a similar episode several months prior but ignored the symptoms at the time. Her stroke and TIA workup included a CT head from 06/24/2012 without contrast which was reported as negative for any intracranial acute abnormality. In addition, I reviewed the images through the PACS system. She had a brain MRI without contrast as well as MRA head without contrast on 06/24/2012: No acute infarct. Moderate small vessel disease type changes. Global atrophy without  hydrocephalus. Paranasal sinus mucosal thickening most notable involving the left maxillary sinus and the left sphenoid sinus. MRA HEAD: Mild intracranial atherosclerotic type changes as detailed above. In addition I reviewed the images to the PACS system. She had an echocardiogram on 06/25/2012 which showed an EF of 55-60%, and  was otherwise unremarkable. Carotid Doppler study from 06/25/2012 showed less than 39% stenosis of both internal carotid arteries. Vertebral arteries showed antegrade flow.  She was placed on Plavix for secondary stroke prevention and has been started on Cynthia. She was also referred to physical therapy for vestibular training, which she completed and felt better up until a month ago, when she had a fairly sudden episode of lightheadedness and dizziness, not nearly as severe as the one in June 2014, but she felt she needed to hold onto something. She held onto the bathroom door frame. This was at night and she was up to use the bathroom and had been up for a minute or so. She is on HCTZ and metoprolol and has been for years. No recent illness, no recent medication changes. She does report R ear fullness for months.   Her Past Medical History Is Significant For: Past Medical History  Diagnosis Date  . Arthritis   . Hyperlipidemia   . Hypertension   . Lipoma     left arm    Her Past Surgical History Is Significant For: Past Surgical History  Procedure Laterality Date  . Rotator cuff repair  09/27/2003  . Cholecystectomy  02/27/04  . Trigger finger release  10/29/06  . Incontinence surgery  09/12/09    Her Family History Is Significant  For: Family History  Problem Relation Age of Onset  . Cancer Maternal Grandmother     ovarian    Her Social History Is Significant For: History   Social History  . Marital Status: Married    Spouse Name: N/A    Number of Children: N/A  . Years of Education: N/A   Social History Main Topics  . Smoking status: Never Smoker   .  Smokeless tobacco: None  . Alcohol Use: No  . Drug Use: No  . Sexual Activity:    Other Topics Concern  . None   Social History Narrative  . None    Her Allergies Are:  Allergies  Allergen Reactions  . Aspirin Anaphylaxis  . Ceclor [Cefaclor] Anaphylaxis  :   Her Current Medications Are:  Outpatient Encounter Prescriptions as of 03/04/2013  Medication Sig  . CALCIUM PO Take 1,000 mg by mouth daily.  . cetirizine (ZYRTEC) 10 MG tablet Take 10 mg by mouth daily.  . Cholecalciferol (VITAMIN D PO) Take 1 tablet by mouth daily.  . clopidogrel (PLAVIX) 75 MG tablet Take 1 tablet (75 mg total) by mouth daily with breakfast.  . ezetimibe (ZETIA) 10 MG tablet Take 10 mg by mouth daily.  . hydrochlorothiazide (HYDRODIURIL) 25 MG tablet Take 25 mg by mouth daily.  . metoprolol succinate (TOPROL-XL) 25 MG 24 hr tablet Take 1 tablet by mouth daily.  . mometasone (NASONEX) 50 MCG/ACT nasal spray Place 2 sprays into the nose daily.  . Multiple Vitamin (MULTIVITAMIN) capsule Take 1 capsule by mouth daily.  Marland Kitchen VITAMIN E PO Take 2 tablets by mouth daily.   . [DISCONTINUED] metoprolol tartrate (LOPRESSOR) 25 MG tablet Take 25 mg by mouth daily.  :  Review of Systems:  Out of a complete 14 point review of systems, all are reviewed and negative with the exception of these symptoms as listed below:   Review of Systems  Constitutional: Positive for fatigue.  HENT: Positive for tinnitus.   Eyes: Negative.   Respiratory: Negative.   Cardiovascular: Negative.   Gastrointestinal: Negative.   Endocrine: Negative.   Genitourinary: Negative.   Musculoskeletal: Positive for arthralgias.  Skin: Negative.   Allergic/Immunologic: Positive for environmental allergies.  Neurological: Positive for dizziness and headaches.  Hematological: Bruises/bleeds easily.  Psychiatric/Behavioral: Negative.     Objective:  Neurologic Exam  Physical Exam Physical Examination:   Filed Vitals:   03/04/13 1048   BP: 117/81  Pulse: 91  Temp:    Her sitting BP and pulse: 145/79, 82 and above values are standing. She has mild dizziness upon standing, no vertigo.   General Examination: The patient is a very pleasant 80 y.o. female in no acute distress. She appears well-developed and well-nourished and well groomed.   HEENT: Normocephalic, atraumatic, pupils are equal, round and reactive to light and accommodation. Funduscopic exam is normal with sharp disc margins noted. Extraocular tracking is good without limitation to gaze excursion or nystagmus noted. Normal smooth pursuit is noted. Hearing is grossly intact. Tympanic membranes are clear bilaterally. Face is symmetric with normal facial animation and normal facial sensation. Speech is clear with no dysarthria noted. There is no hypophonia. There is no lip, neck/head, jaw or voice tremor. Neck is supple with full range of passive and active motion. There are no carotid bruits on auscultation. Oropharynx exam reveals: mild mouth dryness, adequate dental hygiene and mild airway crowding. Mallampati is class II. Tongue protrudes centrally and palate elevates symmetrically.   Chest: Clear to auscultation  without wheezing, rhonchi or crackles noted.  Heart: S1+S2+0, regular and normal without murmurs, rubs or gallops noted.   Abdomen: Soft, non-tender and non-distended with normal bowel sounds appreciated on auscultation.  Extremities: There is no pitting edema in the distal lower extremities bilaterally. Pedal pulses are intact.  Skin: Warm and dry without trophic changes noted. There are no varicose veins.  Musculoskeletal: exam reveals no obvious joint deformities, tenderness or joint swelling or erythema.   Neurologically:  Mental status: The patient is awake, alert and oriented in all 4 spheres. Her immediate and remote memory, attention, language skills and fund of knowledge are appropriate. There is no evidence of aphasia, agnosia, apraxia or  anomia. Speech is clear with normal prosody and enunciation. Thought process is linear. Mood is normal and affect is normal.  Cranial nerves II - XII are as described above under HEENT exam. In addition: shoulder shrug is normal with equal shoulder height noted. Motor exam: Normal bulk, strength and tone is noted. There is no drift, tremor or rebound. Romberg is negative. Reflexes are 2+ throughout. Babinski: Toes are flexor bilaterally. Fine motor skills and coordination: intact with normal finger taps, normal hand movements, normal rapid alternating patting, normal foot taps and normal foot agility.  Cerebellar testing: No dysmetria or intention tremor on finger to nose testing. Heel to shin is unremarkable bilaterally. There is no truncal or gait ataxia.  Sensory exam: intact to light touch, pinprick, vibration, temperature sense in the upper and lower extremities.  Gait, station and balance: She stands easily. No veering to one side is noted. No leaning to one side is noted. Posture is age-appropriate and stance is narrow based. Gait shows normal stride length and normal pace. No problems turning are noted. She turns en bloc. Tandem walk is unremarkable. Intact toe and heel stance is noted.               Assessment and Plan:   In summary, NAEL HABTE is a very pleasant 80 y.o.-year old female with an underlying medical history of hypertension, HLP, recurrent headaches, allergies, asthma, nasal polyps and chronic sinus disease, osteopenia, reflux disease with hiatal hernia, degenerative disc disease in her neck and degenerative back disease with T12 compression fracture in 1998, hyperlipidemia and arthritis, who has a prior diagnosis of benign positional vertigo and reports a intermittent milder lightheadedness and dizziness sensation for the past month. Upon examination she has a systolic blood pressure drop with mild orthostatic symptoms. I talked to her at length today about vertigo and dizziness. I  explained to her that vertigo can return without warning. Dizziness may be a function of drop in blood pressure and milder recurrence of vertigo. She is advised to stay extremely well hydrated at all times and change positions rather slowly especially when she gets out of bed at night. She is advised to to sit at the edge of the bed for a little while until she has reassurance that she does not have any lightheadedness or vertigo and again when she stands she is supposed to stand for a little while without walking and then start walking slowly. She is advised that there is no abou one specific treatment. Since she had success with vestibular rehabilitation in the past he can consider sending her back for this. At this time, she may want to monitor her blood pressure at home. She is also advised to discuss with you a potential change in her blood pressure medications depending on whether she does  continue to have orthostatic blood pressure drops. Since she had a very extensive neurological workup in the recent past and she has a nonfocal exam today I will not add any new tests. In fact, I suggested to see her back on an as-needed basis. She was in agreement. Thank you very much for allowing me to participate in the care of this nice patient. If I can be of any further assistance to you please do not hesitate to call me at [phone].  Sincerely,   Star Age, MD, PhD

## 2013-03-04 NOTE — Patient Instructions (Signed)
You may want to monitor your blood pressure at home. You can look into getting a OMRON brand BP machine, not a wrist machine.  Talk to Dr. Chapman Fitch or Anderson Malta about changing or reducing your BP med as you had a drop in your systolic BP of about 28 points today, which can cause dizziness.   You can also talk to your PCP about doing another round of vestibular rehab therapy down the road.   Your exam is good neurologically. I do not suggest any new test - you had extensive work up not too long ago.   Please remember, that vertigo can recur without warning. It can last hours or days. Please change positions slowly and always stay well-hydrated. Physical therapy with particular attention to vestibular rehabilitation can be very helpful. While there is no specific medication that helps with vertigo, some people get relief with as needed use of meclizine. Certain medications can exacerbate vertigo.

## 2013-03-13 ENCOUNTER — Encounter: Payer: Self-pay | Admitting: Cardiology

## 2013-03-17 ENCOUNTER — Encounter: Payer: Self-pay | Admitting: Cardiology

## 2013-03-17 ENCOUNTER — Ambulatory Visit (INDEPENDENT_AMBULATORY_CARE_PROVIDER_SITE_OTHER): Payer: Federal, State, Local not specified - PPO | Admitting: Cardiology

## 2013-03-17 VITALS — BP 130/86 | HR 76 | Ht 60.75 in | Wt 137.1 lb

## 2013-03-17 DIAGNOSIS — I4949 Other premature depolarization: Secondary | ICD-10-CM

## 2013-03-17 DIAGNOSIS — R42 Dizziness and giddiness: Secondary | ICD-10-CM

## 2013-03-17 DIAGNOSIS — E785 Hyperlipidemia, unspecified: Secondary | ICD-10-CM

## 2013-03-17 DIAGNOSIS — R011 Cardiac murmur, unspecified: Secondary | ICD-10-CM

## 2013-03-17 DIAGNOSIS — I493 Ventricular premature depolarization: Secondary | ICD-10-CM

## 2013-03-17 NOTE — Patient Instructions (Signed)
Your physician recommends that you continue on your current medications as directed. Please refer to the Current Medication list given to you today.  Your physician has requested that you have an echocardiogram. Echocardiography is a painless test that uses sound waves to create images of your heart. It provides your doctor with information about the size and shape of your heart and how well your heart's chambers and valves are working. This procedure takes approximately one hour. There are no restrictions for this procedure.   Your physician recommends that you schedule a follow-up appointment as needed  

## 2013-03-17 NOTE — Progress Notes (Signed)
[facility]. [address], [address] [place], [place]  [address] Phone: [phone] Fax:  [phone]  Date:  03/17/2013   ID:  [name] [name], DOB 06-26-1931, MRN [id number]  PCP:  Antony Blackbird, MD   History of Present Illness: [name] [name] is a 80 y.o. female here for the evaluation of dizziness, heart murmur. In review of [name] [name] note from 03/01/13, she had a two-week history of fatigue, feeling unbalanced especially when changing positions. She has had the sensation of falling to her right side in the middle night when going to the bathroom. Sensation of movement. Vertigo-like. No syncope. She had the similar symptoms approximately 6 months ago, went to emergency department where CT and MRI were normal. Carotid Dopplers were normal. She was treated for benign positional vertigo with Epley maneuver. She was improved. She was placed on Plavix for possible TIA, did not take aspirin because of allergy, and has not followed up with neurology. Her aspirin allergy causes shortness of breath, palpitations, and she also has allergy to statins, Mevacor, Lipitor, Zocor. Elevated LFTs.   Wt Readings from Last 3 Encounters:  03/17/13 137 lb 1.9 oz (62.197 kg)  03/04/13 135 lb (61.236 kg)  06/25/12 137 lb 10.8 oz (62.45 kg)     Past Medical History  Diagnosis Date  . Arthritis   . Hyperlipidemia   . Hypertension   . Lipoma     left arm  . Nasal polyps     Past Surgical History  Procedure Laterality Date  . Rotator cuff repair  09/27/2003  . Cholecystectomy  02/27/04  . Trigger finger release  10/29/06  . Incontinence surgery  09/12/09    Current Outpatient Prescriptions  Medication Sig Dispense Refill  . CALCIUM PO Take 1,000 mg by mouth daily.      . cetirizine (ZYRTEC) 10 MG tablet Take 10 mg by mouth daily.      . Cholecalciferol (VITAMIN D PO) Take 1 tablet by mouth daily.      . clopidogrel (PLAVIX) 75 MG tablet Take 1 tablet (75 mg total) by mouth daily with breakfast.  30  tablet  1  . ezetimibe (ZETIA) 10 MG tablet Take 10 mg by mouth daily.      . hydrochlorothiazide (HYDRODIURIL) 25 MG tablet Take 25 mg by mouth daily.      . metoprolol succinate (TOPROL-XL) 25 MG 24 hr tablet Take 1 tablet by mouth daily.      . mometasone (NASONEX) 50 MCG/ACT nasal spray Place 2 sprays into the nose daily.      . Multiple Vitamin (MULTIVITAMIN) capsule Take 1 capsule by mouth daily.      Marland Kitchen VITAMIN E PO Take 1 tablet by mouth daily.        No current facility-administered medications for this visit.    Allergies:    Allergies  Allergen Reactions  . Aspirin Anaphylaxis  . Ceclor [Cefaclor] Anaphylaxis  . Statins   . Penicillins Rash  . Sulfa Antibiotics Rash    Social History:  The patient  reports that she has never smoked. She does not have any smokeless tobacco history on file. She reports that she does not drink alcohol or use illicit drugs.   Family History  Problem Relation Age of Onset  . Cancer Maternal Grandmother     ovarian   Brother with MI at 47.   ROS:  Please see the history of present illness.   Denies any strokelike symptoms,  fevers, chills, rash, syncope, bleeding   All other systems reviewed and negative.   PHYSICAL EXAM: VS:  Ht 5' 0.75" (1.543 m)  Wt 137 lb 1.9 oz (62.197 kg)  BMI 26.12 kg/m2 Well nourished, well developed, in no acute distress HEENT: normal, [place]/AT, EOMI Neck: no JVD, normal carotid upstroke, no bruit Cardiac:  normal S1, S2; RRR; 2/6 systolic murmur Lungs:  clear to auscultation bilaterally, no wheezing, rhonchi or rales Abd: soft, nontender, no hepatomegaly, no bruits Ext: no edema, 2+ distal pulses Skin: warm and dry GU: deferred Neuro: no focal abnormalities noted, AAO x 3  EKG:  03/17/13 - Sinus rhythm rate 76 with PVC, otherwise normal EKG. Sinus arrhythmia noted. Heart rate 76.    Labs: Hemoglobin A1c 6.0, hemoglobin 12.8, creatinine 1.08, potassium 4.5, ALT 244, AST 44, ALT 37  ASSESSMENT AND  PLAN:  1. Heart murmur-we will check an echocardiogram to observe for any valvular abnormalities. Could be mitral regurgitation. Fairly soft murmur. 2. Dizziness-agree with possible vertigo/inner ear. She seems to consistently fall or listed in the direction of right side. No syncope. Uneasy feeling. Can happen when getting up from the bathroom at night. 3. PVC-seen on EKG. Likely benign. Echocardiogram will be helpful. Currently taking metoprolol. Excellent. 4. Hyperlipidemia-continue with Zetia. Intolerant to statin use. 5. We will followup with echocardiogram.  Signed, Candee Furbish, MD [facility]  03/17/2013 11:29 AM

## 2013-04-05 ENCOUNTER — Other Ambulatory Visit (HOSPITAL_COMMUNITY): Payer: Medicare Other

## 2013-04-05 ENCOUNTER — Ambulatory Visit (HOSPITAL_COMMUNITY): Payer: Federal, State, Local not specified - PPO | Attending: Cardiology | Admitting: Radiology

## 2013-04-05 DIAGNOSIS — R011 Cardiac murmur, unspecified: Secondary | ICD-10-CM

## 2013-04-05 NOTE — Progress Notes (Signed)
Echocardiogram performed.  

## 2013-09-10 ENCOUNTER — Other Ambulatory Visit: Payer: Self-pay | Admitting: Family Medicine

## 2013-09-10 DIAGNOSIS — R42 Dizziness and giddiness: Secondary | ICD-10-CM

## 2013-09-10 DIAGNOSIS — E785 Hyperlipidemia, unspecified: Secondary | ICD-10-CM

## 2013-09-11 ENCOUNTER — Emergency Department (HOSPITAL_COMMUNITY): Payer: Medicare Other

## 2013-09-11 ENCOUNTER — Observation Stay (HOSPITAL_COMMUNITY): Payer: Medicare Other

## 2013-09-11 ENCOUNTER — Observation Stay (HOSPITAL_COMMUNITY): Payer: Medicare Other | Admitting: Registered Nurse

## 2013-09-11 ENCOUNTER — Encounter (HOSPITAL_COMMUNITY): Payer: Self-pay | Admitting: Emergency Medicine

## 2013-09-11 ENCOUNTER — Encounter (HOSPITAL_COMMUNITY)
Admission: EM | Disposition: A | Discharge status: Skilled Nursing Facility | Payer: Self-pay | Source: Home / Self Care | Attending: Orthopedic Surgery

## 2013-09-11 ENCOUNTER — Inpatient Hospital Stay (HOSPITAL_COMMUNITY)
Admission: EM | Admit: 2013-09-11 | Discharge: 2013-09-13 | DRG: 494 | Disposition: A | Discharge status: Skilled Nursing Facility | Payer: Medicare Other | Attending: Orthopedic Surgery | Admitting: Orthopedic Surgery

## 2013-09-11 ENCOUNTER — Encounter (HOSPITAL_COMMUNITY): Payer: Medicare Other | Admitting: Registered Nurse

## 2013-09-11 DIAGNOSIS — I10 Essential (primary) hypertension: Secondary | ICD-10-CM | POA: Diagnosis present

## 2013-09-11 DIAGNOSIS — S82843A Displaced bimalleolar fracture of unspecified lower leg, initial encounter for closed fracture: Secondary | ICD-10-CM | POA: Diagnosis present

## 2013-09-11 DIAGNOSIS — W108XXA Fall (on) (from) other stairs and steps, initial encounter: Secondary | ICD-10-CM | POA: Diagnosis present

## 2013-09-11 DIAGNOSIS — S82891A Other fracture of right lower leg, initial encounter for closed fracture: Secondary | ICD-10-CM

## 2013-09-11 DIAGNOSIS — S82899A Other fracture of unspecified lower leg, initial encounter for closed fracture: Secondary | ICD-10-CM | POA: Diagnosis present

## 2013-09-11 DIAGNOSIS — E785 Hyperlipidemia, unspecified: Secondary | ICD-10-CM | POA: Diagnosis present

## 2013-09-11 DIAGNOSIS — M25579 Pain in unspecified ankle and joints of unspecified foot: Secondary | ICD-10-CM | POA: Diagnosis not present

## 2013-09-11 HISTORY — PX: ORIF ANKLE FRACTURE: SHX5408

## 2013-09-11 LAB — BASIC METABOLIC PANEL
Anion gap: 13 (ref 5–15)
BUN: 19 mg/dL (ref 6–23)
CALCIUM: 9.6 mg/dL (ref 8.4–10.5)
CO2: 30 mEq/L (ref 19–32)
Chloride: 97 mEq/L (ref 96–112)
Creatinine, Ser: 1.04 mg/dL (ref 0.50–1.10)
GFR, EST AFRICAN AMERICAN: 58 mL/min — AB (ref >90)
GFR, EST NON AFRICAN AMERICAN: 50 mL/min — AB (ref >90)
Glucose, Bld: 100 mg/dL — ABNORMAL HIGH (ref 70–99)
Potassium: 3.3 mEq/L — ABNORMAL LOW (ref 3.7–5.3)
Sodium: 140 mEq/L (ref 137–147)

## 2013-09-11 LAB — CBC
HCT: 37.3 % (ref 36.0–46.0)
Hemoglobin: 12.4 g/dL (ref 12.0–15.0)
MCH: 29.6 pg (ref 26.0–34.0)
MCHC: 33.2 g/dL (ref 30.0–36.0)
MCV: 89 fL (ref 78.0–100.0)
PLATELETS: 243 10*3/uL (ref 150–400)
RBC: 4.19 MIL/uL (ref 3.87–5.11)
RDW: 13.3 % (ref 11.5–15.5)
WBC: 9.8 10*3/uL (ref 4.0–10.5)

## 2013-09-11 LAB — SURGICAL PCR SCREEN
MRSA, PCR: NEGATIVE
Staphylococcus aureus: NEGATIVE

## 2013-09-11 LAB — ABO/RH: ABO/RH(D): O POS

## 2013-09-11 LAB — TYPE AND SCREEN
ABO/RH(D): O POS
Antibody Screen: NEGATIVE

## 2013-09-11 SURGERY — OPEN REDUCTION INTERNAL FIXATION (ORIF) ANKLE FRACTURE
Anesthesia: General | Site: Ankle | Laterality: Right

## 2013-09-11 MED ORDER — DEXAMETHASONE SODIUM PHOSPHATE 10 MG/ML IJ SOLN
INTRAMUSCULAR | Status: AC
  Filled 2013-09-11: qty 1

## 2013-09-11 MED ORDER — MEPERIDINE HCL 25 MG/ML IJ SOLN: 6.2500 mg | INTRAMUSCULAR | Status: DC | PRN

## 2013-09-11 MED ORDER — MULTIVITAMINS PO CAPS: 1.0000 | ORAL_CAPSULE | Freq: Every day | ORAL | Status: DC

## 2013-09-11 MED ORDER — FENTANYL CITRATE 0.05 MG/ML IJ SOLN
INTRAMUSCULAR | Status: AC
  Filled 2013-09-11: qty 5

## 2013-09-11 MED ORDER — LIDOCAINE HCL (CARDIAC) 20 MG/ML IV SOLN
INTRAVENOUS | Status: DC | PRN
  Administered 2013-09-11: 50 mg via INTRAVENOUS

## 2013-09-11 MED ORDER — FENTANYL CITRATE 0.05 MG/ML IJ SOLN
75.0000 ug | Freq: Once | INTRAMUSCULAR | Status: AC
  Administered 2013-09-11: 75 ug via INTRAVENOUS
  Filled 2013-09-11: qty 2

## 2013-09-11 MED ORDER — FENTANYL CITRATE 0.05 MG/ML IJ SOLN: 25.0000 ug | INTRAMUSCULAR | Status: DC | PRN

## 2013-09-11 MED ORDER — ONDANSETRON HCL 4 MG/2ML IJ SOLN: 4.0000 mg | Freq: Four times a day (QID) | INTRAMUSCULAR | Status: DC | PRN

## 2013-09-11 MED ORDER — HYDROCHLOROTHIAZIDE 25 MG PO TABS
25.0000 mg | ORAL_TABLET | Freq: Every day | ORAL | Status: DC
  Administered 2013-09-12 – 2013-09-13 (×2): 25 mg via ORAL
  Filled 2013-09-11 (×3): qty 1

## 2013-09-11 MED ORDER — METOCLOPRAMIDE HCL 5 MG/ML IJ SOLN: 5.0000 mg | Freq: Three times a day (TID) | INTRAMUSCULAR | Status: DC | PRN

## 2013-09-11 MED ORDER — ONDANSETRON HCL 4 MG PO TABS: 4.0000 mg | ORAL_TABLET | Freq: Four times a day (QID) | ORAL | Status: DC | PRN

## 2013-09-11 MED ORDER — CLINDAMYCIN PHOSPHATE 900 MG/50ML IV SOLN
INTRAVENOUS | Status: DC | PRN
  Administered 2013-09-11: 900 mg via INTRAVENOUS

## 2013-09-11 MED ORDER — ROCURONIUM BROMIDE 100 MG/10ML IV SOLN
INTRAVENOUS | Status: DC | PRN
  Administered 2013-09-11: 30 mg via INTRAVENOUS

## 2013-09-11 MED ORDER — MIDAZOLAM HCL 2 MG/2ML IJ SOLN
INTRAMUSCULAR | Status: AC
  Filled 2013-09-11: qty 2

## 2013-09-11 MED ORDER — PROPOFOL 10 MG/ML IV BOLUS
INTRAVENOUS | Status: AC
  Filled 2013-09-11: qty 20

## 2013-09-11 MED ORDER — METOPROLOL SUCCINATE ER 25 MG PO TB24
25.0000 mg | ORAL_TABLET | Freq: Every day | ORAL | Status: DC
  Administered 2013-09-12 – 2013-09-13 (×2): 25 mg via ORAL
  Filled 2013-09-11 (×3): qty 1

## 2013-09-11 MED ORDER — MIDAZOLAM HCL 2 MG/2ML IJ SOLN
0.5000 mg | INTRAMUSCULAR | Status: DC | PRN
  Administered 2013-09-11 (×2): 0.5 mg via INTRAVENOUS

## 2013-09-11 MED ORDER — ADULT MULTIVITAMIN W/MINERALS CH
1.0000 | ORAL_TABLET | Freq: Every day | ORAL | Status: DC
  Administered 2013-09-12 – 2013-09-13 (×2): 1 via ORAL
  Filled 2013-09-11 (×3): qty 1

## 2013-09-11 MED ORDER — CLINDAMYCIN PHOSPHATE 900 MG/50ML IV SOLN
INTRAVENOUS | Status: AC
  Filled 2013-09-11: qty 50

## 2013-09-11 MED ORDER — ENOXAPARIN SODIUM 40 MG/0.4ML ~~LOC~~ SOLN
40.0000 mg | SUBCUTANEOUS | Status: DC
  Administered 2013-09-12 – 2013-09-13 (×2): 40 mg via SUBCUTANEOUS
  Filled 2013-09-11 (×3): qty 0.4

## 2013-09-11 MED ORDER — SODIUM CHLORIDE 0.9 % IV SOLN: INTRAVENOUS | Status: DC

## 2013-09-11 MED ORDER — MORPHINE SULFATE 2 MG/ML IJ SOLN
1.0000 mg | INTRAMUSCULAR | Status: DC | PRN
  Administered 2013-09-11 – 2013-09-12 (×2): 2 mg via INTRAVENOUS
  Filled 2013-09-11 (×2): qty 1

## 2013-09-11 MED ORDER — FLUTICASONE PROPIONATE 50 MCG/ACT NA SUSP
2.0000 | Freq: Every day | NASAL | Status: DC
  Administered 2013-09-12 – 2013-09-13 (×2): 2 via NASAL
  Filled 2013-09-11: qty 16

## 2013-09-11 MED ORDER — 0.9 % SODIUM CHLORIDE (POUR BTL) OPTIME
TOPICAL | Status: DC | PRN
  Administered 2013-09-11: 1000 mL

## 2013-09-11 MED ORDER — PROMETHAZINE HCL 25 MG/ML IJ SOLN: 6.2500 mg | INTRAMUSCULAR | Status: DC | PRN

## 2013-09-11 MED ORDER — LACTATED RINGERS IV SOLN
INTRAVENOUS | Status: DC | PRN
  Administered 2013-09-11: 15:00:00 via INTRAVENOUS

## 2013-09-11 MED ORDER — METOCLOPRAMIDE HCL 10 MG PO TABS: 5.0000 mg | ORAL_TABLET | Freq: Three times a day (TID) | ORAL | Status: DC | PRN

## 2013-09-11 MED ORDER — ONDANSETRON HCL 4 MG/2ML IJ SOLN
INTRAMUSCULAR | Status: DC | PRN
  Administered 2013-09-11: 4 mg via INTRAVENOUS

## 2013-09-11 MED ORDER — GLYCOPYRROLATE 0.2 MG/ML IJ SOLN
INTRAMUSCULAR | Status: DC | PRN
  Administered 2013-09-11: .4 mg via INTRAVENOUS

## 2013-09-11 MED ORDER — ONDANSETRON HCL 4 MG/2ML IJ SOLN
INTRAMUSCULAR | Status: AC
  Filled 2013-09-11: qty 2

## 2013-09-11 MED ORDER — CLINDAMYCIN PHOSPHATE 900 MG/50ML IV SOLN: 900.0000 mg | INTRAVENOUS | Status: DC

## 2013-09-11 MED ORDER — ACETAMINOPHEN 10 MG/ML IV SOLN
1000.0000 mg | Freq: Once | INTRAVENOUS | Status: DC | PRN
  Filled 2013-09-11: qty 100

## 2013-09-11 MED ORDER — PROPOFOL 10 MG/ML IV BOLUS
INTRAVENOUS | Status: DC | PRN
  Administered 2013-09-11: 100 mg via INTRAVENOUS

## 2013-09-11 MED ORDER — HYDROCODONE-ACETAMINOPHEN 5-325 MG PO TABS
1.0000 | ORAL_TABLET | ORAL | Status: DC | PRN
  Administered 2013-09-12 – 2013-09-13 (×3): 2 via ORAL
  Filled 2013-09-11 (×3): qty 2

## 2013-09-11 MED ORDER — HYDROMORPHONE HCL PF 1 MG/ML IJ SOLN: 0.5000 mg | INTRAMUSCULAR | Status: DC | PRN

## 2013-09-11 MED ORDER — DEXAMETHASONE SODIUM PHOSPHATE 10 MG/ML IJ SOLN
INTRAMUSCULAR | Status: DC | PRN
  Administered 2013-09-11: 10 mg via INTRAVENOUS

## 2013-09-11 MED ORDER — FENTANYL CITRATE 0.05 MG/ML IJ SOLN
INTRAMUSCULAR | Status: DC | PRN
  Administered 2013-09-11 (×3): 50 ug via INTRAVENOUS
  Administered 2013-09-11 (×2): 25 ug via INTRAVENOUS

## 2013-09-11 MED ORDER — EPHEDRINE SULFATE 50 MG/ML IJ SOLN
INTRAMUSCULAR | Status: AC
  Filled 2013-09-11: qty 1

## 2013-09-11 MED ORDER — NEOSTIGMINE METHYLSULFATE 10 MG/10ML IV SOLN
INTRAVENOUS | Status: DC | PRN
  Administered 2013-09-11: 3 mg via INTRAVENOUS

## 2013-09-11 MED ORDER — LORATADINE 10 MG PO TABS
10.0000 mg | ORAL_TABLET | Freq: Every day | ORAL | Status: DC
  Administered 2013-09-12 – 2013-09-13 (×2): 10 mg via ORAL
  Filled 2013-09-11 (×3): qty 1

## 2013-09-11 MED ORDER — SODIUM CHLORIDE 0.9 % IJ SOLN
INTRAMUSCULAR | Status: AC
  Filled 2013-09-11: qty 10

## 2013-09-11 MED ORDER — LIDOCAINE HCL (CARDIAC) 20 MG/ML IV SOLN
INTRAVENOUS | Status: AC
  Filled 2013-09-11: qty 5

## 2013-09-11 MED ORDER — CLOPIDOGREL BISULFATE 75 MG PO TABS
75.0000 mg | ORAL_TABLET | Freq: Every day | ORAL | Status: DC
  Administered 2013-09-12 – 2013-09-13 (×2): 75 mg via ORAL
  Filled 2013-09-11 (×4): qty 1

## 2013-09-11 MED ORDER — CLINDAMYCIN PHOSPHATE 600 MG/50ML IV SOLN
600.0000 mg | Freq: Four times a day (QID) | INTRAVENOUS | Status: AC
  Administered 2013-09-11 – 2013-09-12 (×3): 600 mg via INTRAVENOUS
  Filled 2013-09-11 (×3): qty 50

## 2013-09-11 MED ORDER — EZETIMIBE 10 MG PO TABS
10.0000 mg | ORAL_TABLET | Freq: Every day | ORAL | Status: DC
  Administered 2013-09-12 – 2013-09-13 (×2): 10 mg via ORAL
  Filled 2013-09-11 (×3): qty 1

## 2013-09-11 SURGICAL SUPPLY — 47 items
BAG SPEC THK2 15X12 ZIP CLS (MISCELLANEOUS) ×1
BAG ZIPLOCK 12X15 (MISCELLANEOUS) ×2 IMPLANT
BANDAGE ELASTIC 4 VELCRO ST LF (GAUZE/BANDAGES/DRESSINGS) ×2 IMPLANT
BIT DRILL 2.5X2.75 QC CALB (BIT) ×2 IMPLANT
BIT DRILL 2.9 CANN QC NONSTRL (BIT) ×2 IMPLANT
BIT DRILL CALIBRATED 2.7 (BIT) ×2 IMPLANT
BNDG GAUZE ELAST 4 BULKY (GAUZE/BANDAGES/DRESSINGS) ×4 IMPLANT
CUFF TOURN SGL QUICK 34 (TOURNIQUET CUFF) ×2
CUFF TRNQT CYL 34X4X40X1 (TOURNIQUET CUFF) ×1 IMPLANT
DRAPE C-ARM 42X120 X-RAY (DRAPES) ×2 IMPLANT
DRAPE U-SHAPE 47X51 STRL (DRAPES) ×2 IMPLANT
DRSG PAD ABDOMINAL 8X10 ST (GAUZE/BANDAGES/DRESSINGS) ×2 IMPLANT
DURAPREP 26ML APPLICATOR (WOUND CARE) ×2 IMPLANT
ELECT REM PT RETURN 9FT ADLT (ELECTROSURGICAL) ×2
ELECTRODE REM PT RTRN 9FT ADLT (ELECTROSURGICAL) ×1 IMPLANT
GAUZE SPONGE 4X4 12PLY STRL (GAUZE/BANDAGES/DRESSINGS) ×4 IMPLANT
GAUZE XEROFORM 5X9 LF (GAUZE/BANDAGES/DRESSINGS) ×2 IMPLANT
GLOVE ORTHO TXT STRL SZ7.5 (GLOVE) ×2 IMPLANT
GLOVE SURG ORTHO 8.5 STRL (GLOVE) ×2 IMPLANT
GOWN STRL REUS W/TWL LRG LVL3 (GOWN DISPOSABLE) ×4 IMPLANT
K-WIRE ACE 1.6X6 (WIRE) ×4
KWIRE ACE 1.6X6 (WIRE) ×2 IMPLANT
MANIFOLD NEPTUNE II (INSTRUMENTS) ×2 IMPLANT
PACK LOWER EXTREMITY WL (CUSTOM PROCEDURE TRAY) ×2 IMPLANT
PAD ABD 8X10 STRL (GAUZE/BANDAGES/DRESSINGS) ×2 IMPLANT
PAD CAST 4YDX4 CTTN HI CHSV (CAST SUPPLIES) ×2 IMPLANT
PADDING CAST COTTON 4X4 STRL (CAST SUPPLIES) ×4
PLATE ACE 3.5MM 4HOLE (Plate) ×2 IMPLANT
PLATE LOCK 6H 77 BILAT FIB (Plate) ×2 IMPLANT
POSITIONER SURGICAL ARM (MISCELLANEOUS) ×2 IMPLANT
SCOTCHCAST PLUS 4X4 WHITE (CAST SUPPLIES) ×2 IMPLANT
SCREW ACE CAN 4.0 44M (Screw) ×4 IMPLANT
SCREW CORTICAL 3.5MM 14MM (Screw) ×2 IMPLANT
SCREW CORTICAL 3.5MM 18MM (Screw) ×2 IMPLANT
SCREW LOCK CORT STAR 3.5X12 (Screw) ×2 IMPLANT
SCREW LOCK CORT STAR 3.5X14 (Screw) ×2 IMPLANT
SCREW LOW PROFILE 12MMX3.5MM (Screw) ×4 IMPLANT
SPLINT PLASTER CAST XFAST 5X30 (CAST SUPPLIES) ×3 IMPLANT
SPLINT PLASTER XFAST SET 5X30 (CAST SUPPLIES) ×3
SPONGE LAP 4X18 X RAY DECT (DISPOSABLE) ×2 IMPLANT
SUT ETHILON 4 0 PS 2 18 (SUTURE) ×4 IMPLANT
SUT PROLENE 3 0 FS 2 (SUTURE) ×2 IMPLANT
SUT VIC AB 1 CT1 27 (SUTURE) ×4
SUT VIC AB 1 CT1 27XBRD ANTBC (SUTURE) ×2 IMPLANT
SUT VIC AB 2-0 CT1 27 (SUTURE) ×2
SUT VIC AB 2-0 CT1 TAPERPNT 27 (SUTURE) ×1 IMPLANT
TOWEL OR 17X26 10 PK STRL BLUE (TOWEL DISPOSABLE) ×4 IMPLANT

## 2013-09-11 NOTE — Anesthesia Postprocedure Evaluation (Signed)
  Anesthesia Post-op Note  Anesthesia Post Note  Patient: [name] [name]  Procedure(s) Performed: Procedure(s) (LRB): OPEN REDUCTION INTERNAL FIXATION (ORIF) ANKLE FRACTURE (Right)  Anesthesia type: General  Patient location: PACU  Post pain: Pain level controlled  Post assessment: Post-op Vital signs reviewed  Last Vitals:  Filed Vitals:   09/11/13 1830  BP:   Pulse: 68  Temp:   Resp: 10    Post vital signs: Reviewed  Level of consciousness: sedated  Complications: No apparent anesthesia complications

## 2013-09-11 NOTE — Anesthesia Preprocedure Evaluation (Addendum)
Anesthesia Evaluation  Patient identified by MRN, date of birth, ID band Patient awake    Reviewed: Allergy & Precautions, H&P , NPO status , Patient's Chart, lab work & pertinent test results, reviewed documented beta blocker date and time   History of Anesthesia Complications Negative for: history of anesthetic complications  Airway Mallampati: II TM Distance: >3 FB Neck ROM: full    Dental  (+) Teeth Intact   Pulmonary  allergies breath sounds clear to auscultation  Pulmonary exam normal       Cardiovascular Exercise Tolerance: Good hypertension, On Home Beta Blockers Rhythm:regular Rate:Normal  hyperlipidemia   Neuro/Psych TIA (suspected in 6/14 (vs vestibular problem) - has been on plavix since)negative psych ROS   GI/Hepatic negative GI ROS, Neg liver ROS,   Endo/Other  negative endocrine ROS  Renal/GU negative Renal ROS  negative genitourinary   Musculoskeletal  (+) Arthritis -,   Abdominal   Peds  Hematology negative hematology ROS (+)   Anesthesia Other Findings Last ate evening of 09/10/13 Fell 8 am 09/11/13 Last took plavix and metoprolol in am on 8/21 No labs sent since admission - sending now in holding  Reproductive/Obstetrics negative OB ROS                         Anesthesia Physical Anesthesia Plan  ASA: III  Anesthesia Plan: General ETT   Post-op Pain Management:    Induction: Intravenous  Airway Management Planned: Oral ETT  Additional Equipment:   Intra-op Plan:   Post-operative Plan: Extubation in OR  Informed Consent: I have reviewed the patients History and Physical, chart, labs and discussed the procedure including the risks, benefits and alternatives for the proposed anesthesia with the patient or authorized representative who has indicated his/her understanding and acceptance.   Dental Advisory Given  Plan Discussed with: CRNA and  Surgeon  Anesthesia Plan Comments:        Anesthesia Quick Evaluation

## 2013-09-11 NOTE — ED Notes (Signed)
I have just phoned report to Lovena Le, RN on [facility] and will transport shortly.

## 2013-09-11 NOTE — Brief Op Note (Signed)
09/11/2013  5:44 PM  PATIENT:  [name] [name]  80 y.o. female  PRE-OPERATIVE DIAGNOSIS:  right ankle fracture, displaced bimalleolar  POST-OPERATIVE DIAGNOSIS:  right ankle fracture,  displaced bimalleolar  PROCEDURE:  Procedure(s): OPEN REDUCTION INTERNAL FIXATION (ORIF) ANKLE FRACTURE (Right)  SURGEON:  Surgeon(s) and Role:    * Augustin Schooling, MD - Primary  PHYSICIAN ASSISTANT:   ASSISTANTS: Ventura Bruns, PA-C   ANESTHESIA:   general  EBL:  Total I/O In: 1200 [I.V.:1200] Out: 275 [Urine:250; Blood:25]  BLOOD ADMINISTERED:none  DRAINS: none   LOCAL MEDICATIONS USED:  NONE  SPECIMEN:  No Specimen  DISPOSITION OF SPECIMEN:  N/A  COUNTS:  YES  TOURNIQUET:   Total Tourniquet Time Documented: Thigh (Right) - 75 minutes Total: Thigh (Right) - 75 minutes   DICTATION: .Other Dictation: Dictation Number [id number]  PLAN OF CARE: Admit to inpatient   PATIENT DISPOSITION:  PACU - hemodynamically stable.   Delay start of Pharmacological VTE agent (>24hrs) due to surgical blood loss or risk of bleeding: no

## 2013-09-11 NOTE — Op Note (Signed)
NAMEBRANDAN, [name]                 ACCOUNT NO.:  [id number]  MEDICAL RECORD NO.:  [id number]  LOCATION:  [id number]                         FACILITY:  [facility]  PHYSICIAN:  Croom Veverly Fells, M.D. DATE OF BIRTH:  [birth year]  DATE OF PROCEDURE:  09/11/2013 DATE OF DISCHARGE:                              OPERATIVE REPORT   PREOPERATIVE DIAGNOSIS:  Right ankle fracture dislocation with displaced bimalleolar fracture.  POSTOPERATIVE DIAGNOSIS:  Right ankle fracture dislocation with displaced bimalleolar fracture.  PROCEDURE PERFORMED:  Open reduction and internal fixation of right bimalleolar ankle fracture.  ATTENDING SURGEON:  Doran Heater. Veverly Fells, M.D.  ASSISTANT:  Abbott Pao. Dixon, P.A., who was scrubbed the entire procedure and necessary for satisfactory completion of surgery.  ANESTHESIA:  General anesthesia was used.  ESTIMATED BLOOD LOSS:  Minimal.  FLUID REPLACEMENT:  1000 mL crystalloid.  INSTRUMENT COUNTS:  Correct.  COMPLICATIONS:  There were no complications.  ANTIBIOTICS:  Perioperative antibiotics were given.  INDICATIONS:  The patient is a 80 year old female who sustained a displaced bimalleolar ankle fracture with a dislocation of her tibiotalar joint.  The patient presented with the ankle grossly displaced and was immediately reduced in the emergency department.  This required again a subsequent reduction as initial reduction did not give her anatomically reduced, but the second reduction did get her mortise reduced, and the patient was not all that swollen.  We talked her about the need for operative stabilization of this unstable fracture pattern and informed consent was obtained.  DESCRIPTION OF PROCEDURE:  After an adequate level of anesthesia was achieved, the patient positioned in the supine on the operating room table.  Nonsterile tourniquet was placed on the right proximal thigh. Right leg sterilely prepped and draped in usual sterile manner.  Time- out was  called.  We then initiated surgery after elevation of the limb and exsanguination using Esmarch bandage.  We started with a longitudinal incision over the medial malleolus.  Dissection down through subcutaneous tissues.  We entered the hematoma of the fracture, evacuated that, removed soft tissue from the fracture site, anatomically reduced the medial malleolus.  Checked the medial gutter of the ankle joint to make sure there was no soft tissue that was going to get interposed.  Once we had that reduced, we put 2 pins for the 4.0 cannulated screws on the Biomet ankle fracture set.  We then placed two 44 mm partially threaded cancellous screws across the fracture site, gaining good purchase and excellent anatomic reduction.  We then went to the lateral side of the ankle.  Longitudinal skin incision over the subcutaneous distal fibula, dissection down through subcutaneous tissues, down the straight to bone, suppressed the dissection of the fracture site performed.  This was a distal oblique Weber B type fracture.  It felt like it would be best served with a posterior antiglide plate.  We went ahead and applied that with the ankle fracture reduced.  Two screws placed in the proximal fragment and with a 4-hole 1/3rd tubular plate posteriorly as a antiglide plate.  Unfortunately, the bone right there at the fracture site felt a little bit soft, did not feel great about the purchase, that selected  put on a neutralization plate laterally.  This was an ALPS plate from the Biomet set, placed 2 screws in the proximal fragment, 2 screws in the distal fragment, and had excellent purchase with the screws and felt like this definitely neutralized and protected that antiglide plate and we had an anatomic ankle mortise reduction.  At this point, we thoroughly irrigated the wounds, closed in layers with 2-0 Vicryl and staples.  Sterile compressive bandage and short leg splint was applied.  The  patient tolerated the surgery well.     Doran Heater. Veverly Fells, M.D.     SRN/MEDQ  D:  09/11/2013  T:  09/11/2013  Job:  [id number]

## 2013-09-11 NOTE — ED Notes (Signed)
Bed: [id number] Expected date: 09/11/13 Expected time: 9:33 AM Means of arrival: Ambulance Comments: Fall, leg deformity

## 2013-09-11 NOTE — H&P (Addendum)
  [name] [name] is an 80 y.o. female.    Chief Complaint: right ankle pain  HPI: 80 y/o female sustained a fall earlier today injuring right ankle. C/o immediate pain and deformity to right ankle. Unable to ambulate. Denies any other injuries, no LOC otherwise doing well. Presented to emergency department with badly angulated right ankle dislocation and suspected fractures. Pt had closed reduction in ED prior to transport to x-ray.  PCP:  Antony Blackbird, MD  PMH: Past Medical History  Diagnosis Date  . Arthritis   . Hyperlipidemia   . Hypertension   . Lipoma     left arm  . Nasal polyps     PSH: Past Surgical History  Procedure Laterality Date  . Rotator cuff repair  09/27/2003  . Cholecystectomy  02/27/04  . Trigger finger release  10/29/06  . Incontinence surgery  09/12/09    Social History:  reports that she has never smoked. She does not have any smokeless tobacco history on file. She reports that she does not drink alcohol or use illicit drugs.  Allergies:  Allergies  Allergen Reactions  . Aspirin Anaphylaxis  . Ceclor [Cefaclor] Anaphylaxis  . Statins   . Penicillins Rash  . Sulfa Antibiotics Rash    Medications: No current facility-administered medications for this encounter.   Current Outpatient Prescriptions  Medication Sig Dispense Refill  . CALCIUM PO Take 1,000 mg by mouth daily.      . cetirizine (ZYRTEC) 10 MG tablet Take 10 mg by mouth daily.      . Cholecalciferol (VITAMIN D PO) Take 1 tablet by mouth daily.      . clopidogrel (PLAVIX) 75 MG tablet Take 1 tablet (75 mg total) by mouth daily with breakfast.  30 tablet  1  . ezetimibe (ZETIA) 10 MG tablet Take 10 mg by mouth daily.      . hydrochlorothiazide (HYDRODIURIL) 25 MG tablet Take 25 mg by mouth daily.      . metoprolol succinate (TOPROL-XL) 25 MG 24 hr tablet Take 1 tablet by mouth daily.      . mometasone (NASONEX) 50 MCG/ACT nasal spray Place 2 sprays into the nose daily.      . Multiple Vitamin  (MULTIVITAMIN) capsule Take 1 capsule by mouth daily.        No results found for this or any previous visit (from the past 48 hour(s)). No results found.  ROS: ROS Unable to ambulate Currently on plavix   Physical Exam: Alert and appropriate 80 y/o female in no acute distress Bilateral upper extremities with full rom no tenderness nv intact distally Right ankle: obvious dislocation and suspected fractures to right ankle nv intact distally, no signs of open injury.  Left lower extremity with full rom, no deformity or injury Physical Exam   Assessment/Plan Assessment: right ankle dislocation with suspected fractures  Plan: Closed reduction and splinting performed in the ed prior to x-rays Suspect non weight bearing and d/c home depending on x-rays with f/u in office and surgery later  Pain control as needed   Agree with above assessment and plan.  Patient closed reduced again in the ED.  Unstable bimalleolar ankle fracture.  For surgery later today.  Annie Main, MD

## 2013-09-11 NOTE — ED Notes (Addendum)
Pt from home via EMS-Per EMS pt slipped and fell down 4 steps injuring her R ankle. Pt has obvious deformity. Pt denies other injury, no LOC. Pt is A&O and in NAD. PA from [facility] at bedside. Pt received 150 mcg Fentanyl en route

## 2013-09-11 NOTE — ED Notes (Signed)
She states she fell, while turning, down 3-4 "carpeted stairs and I twisted my (right) ankle".  She arrives in no distress, with a saline lock in right hand started by EMS.  She received 150 mcg Fentanyl en route to hospital.  CMS intact all toes bilat.  Merla Riches, PA for [facility]. Meets her on arrival and reduces right foot, which was mildly displaced laterally; then applies post. Plaster splint.  CMS all toes remain intact.  She is alert and oriented x 4 with clear speech.

## 2013-09-11 NOTE — ED Provider Notes (Signed)
CSN: [id number]     Arrival date & time 09/11/13  0941 History   First MD Initiated Contact with Patient 09/11/13 [phone]     Chief Complaint  Patient presents with  . Leg Injury     (Consider location/radiation/quality/duration/timing/severity/associated sxs/prior Treatment) Patient is a 80 y.o. female presenting with fall. The history is provided by the patient.  Fall This is a new problem. The current episode started less than 1 hour ago. Episode frequency: once. The problem has been resolved. Pertinent negatives include no chest pain, no abdominal pain, no headaches and no shortness of breath. Nothing aggravates the symptoms. Nothing relieves the symptoms. She has tried nothing for the symptoms. The treatment provided no relief.    Past Medical History  Diagnosis Date  . Arthritis   . Hyperlipidemia   . Hypertension   . Lipoma     left arm  . Nasal polyps    Past Surgical History  Procedure Laterality Date  . Rotator cuff repair  09/27/2003  . Cholecystectomy  02/27/04  . Trigger finger release  10/29/06  . Incontinence surgery  09/12/09   Family History  Problem Relation Age of Onset  . Cancer Maternal Grandmother     ovarian   History  Substance Use Topics  . Smoking status: Never Smoker   . Smokeless tobacco: Not on file  . Alcohol Use: No   OB History   Grav Para Term Preterm Abortions TAB SAB Ect Mult Living                 Review of Systems  Constitutional: Negative for fever and fatigue.  HENT: Negative for congestion and drooling.   Eyes: Negative for pain.  Respiratory: Negative for cough and shortness of breath.   Cardiovascular: Negative for chest pain.  Gastrointestinal: Negative for nausea, vomiting, abdominal pain and diarrhea.  Genitourinary: Negative for dysuria and hematuria.  Musculoskeletal: Negative for back pain, gait problem and neck pain.  Skin: Negative for color change.  Neurological: Negative for dizziness and headaches.  Hematological:  Negative for adenopathy.  Psychiatric/Behavioral: Negative for behavioral problems.  All other systems reviewed and are negative.     Allergies  Aspirin; Ceclor; Statins; Penicillins; and Sulfa antibiotics  Home Medications   Prior to Admission medications   Medication Sig Start Date End Date Taking? Authorizing Provider  CALCIUM PO Take 1,000 mg by mouth daily.    Historical Provider, MD  cetirizine (ZYRTEC) 10 MG tablet Take 10 mg by mouth daily.    Historical Provider, MD  Cholecalciferol (VITAMIN D PO) Take 1 tablet by mouth daily.    Historical Provider, MD  clopidogrel (PLAVIX) 75 MG tablet Take 1 tablet (75 mg total) by mouth daily with breakfast. 06/25/12   Erline Hau, MD  ezetimibe (ZETIA) 10 MG tablet Take 10 mg by mouth daily.    Historical Provider, MD  hydrochlorothiazide (HYDRODIURIL) 25 MG tablet Take 25 mg by mouth daily.    Historical Provider, MD  metoprolol succinate (TOPROL-XL) 25 MG 24 hr tablet Take 1 tablet by mouth daily. 02/28/13   Historical Provider, MD  mometasone (NASONEX) 50 MCG/ACT nasal spray Place 2 sprays into the nose daily.    Historical Provider, MD  Multiple Vitamin (MULTIVITAMIN) capsule Take 1 capsule by mouth daily.    Historical Provider, MD  VITAMIN E PO Take 1 tablet by mouth daily.     Historical Provider, MD   BP 147/86  Pulse 83  Temp(Src) 97.6 F (36.4  C) (Oral)  Resp 16  SpO2 98% Physical Exam  Nursing note and vitals reviewed. Constitutional: She is oriented to person, place, and time. She appears well-developed and well-nourished.  HENT:  Head: Normocephalic and atraumatic.  Mouth/Throat: Oropharynx is clear and moist. No oropharyngeal exudate.  Eyes: Conjunctivae and EOM are normal. Pupils are equal, round, and reactive to light.  Neck: Normal range of motion. Neck supple.  No cervical tenderness to palpation.  Cardiovascular: Normal rate, regular rhythm, normal heart sounds and intact distal pulses.  Exam reveals  no gallop and no friction rub.   No murmur heard. Pulmonary/Chest: Effort normal and breath sounds normal. No respiratory distress. She has no wheezes.  Abdominal: Soft. Bowel sounds are normal. There is no tenderness. There is no rebound and no guarding.  Musculoskeletal: She exhibits tenderness. She exhibits no edema.  Initial gross dislocation of the right ankle which was not visualized by me. She is status post reduction by the PA with [facility] orthopedics.  2+ distal pulses and normal sensation in bilateral LE's.   No focal ttp of hips bilaterally. Normal rom of hips bilaterally.   Neurological: She is alert and oriented to person, place, and time.  Skin: Skin is warm and dry.  Psychiatric: She has a normal mood and affect. Her behavior is normal.    ED Course  Procedures (including critical care time) Labs Review Labs Reviewed - No data to display  Imaging Review Dg Ankle Complete Right  09/11/2013   CLINICAL DATA:  Fall with right ankle dislocation status post reduction.  EXAM: RIGHT ANKLE - COMPLETE 3+ VIEW  COMPARISON:  None.  FINDINGS: No comparison studies are available. Bone detail is limited by a plaster splint. There is an oblique fracture of the distal fibula with associated posterolateral displacement. There is a laterally displaced fracture involving the medial malleolus. There is posterolateral subluxation of the talus with respect to the tibial plafond. No tarsal bone fractures are observed.  IMPRESSION: Right ankle fracture subluxation as described with persistent posterolateral subluxation of the talus status post closed reduction. Bone detail obscured by the splint.   Electronically Signed   By: Camie Patience M.D.   On: 09/11/2013 10:59     EKG Interpretation None      MDM   Final diagnoses:  Fracture dislocation of ankle joint, right, closed, initial encounter    9:54 AM 80 y.o. female who presents with a mechanical fall prior to arrival. She states that  she slipped and fell onto her buttocks sliding down 4 stairs and hitting her right ankle on the ground. She denies hitting her head or loss of consciousness. She currently only complains of right ankle pain and has a gross deformity which was reduced by the PA with [facility] orthopedics prior to my evaluation. She is afebrile and vital signs are unremarkable here. No evidence of any other traumatic injury. The ankle is currently being splinted by the PA with [facility] orthopedics. Will get plain film imaging. The patient's pain is currently controlled, she got fentanyl in route.  Dr. Veverly Fells has evaluated the pt. Ortho to admit and take to OR.    Pamella Pert, MD 09/11/13 1135

## 2013-09-11 NOTE — Transfer of Care (Signed)
Immediate Anesthesia Transfer of Care Note  Patient: [name] [name]  Procedure(s) Performed: Procedure(s): OPEN REDUCTION INTERNAL FIXATION (ORIF) ANKLE FRACTURE (Right)  Patient Location: PACU  Anesthesia Type:General  Level of Consciousness: awake, alert , oriented and patient cooperative  Airway & Oxygen Therapy: Patient Spontanous Breathing and Patient connected to face mask oxygen  Post-op Assessment: Report given to PACU RN, Post -op Vital signs reviewed and stable and Patient moving all extremities  Post vital signs: Reviewed and stable  Complications: No apparent anesthesia complications

## 2013-09-11 NOTE — ED Notes (Signed)
Dr. Veverly Fells has just re-reduced her right ankle, preceded by IV Fentanyl.  She tol. This procedure well--portable x-ray ordered by PA Brad.  Her husband remains with her.

## 2013-09-12 LAB — BASIC METABOLIC PANEL
ANION GAP: 13 (ref 5–15)
BUN: 15 mg/dL (ref 6–23)
CALCIUM: 9.1 mg/dL (ref 8.4–10.5)
CO2: 28 meq/L (ref 19–32)
CREATININE: 0.97 mg/dL (ref 0.50–1.10)
Chloride: 98 mEq/L (ref 96–112)
GFR calc Af Amer: 63 mL/min — ABNORMAL LOW (ref >90)
GFR, EST NON AFRICAN AMERICAN: 54 mL/min — AB (ref >90)
GLUCOSE: 154 mg/dL — AB (ref 70–99)
Potassium: 3.5 mEq/L — ABNORMAL LOW (ref 3.7–5.3)
SODIUM: 139 meq/L (ref 137–147)

## 2013-09-12 MED ORDER — ACETAMINOPHEN 500 MG PO TABS
1000.0000 mg | ORAL_TABLET | Freq: Three times a day (TID) | ORAL | Status: DC | PRN
  Administered 2013-09-12: 1000 mg via ORAL
  Filled 2013-09-12: qty 2

## 2013-09-12 MED ORDER — DOCUSATE SODIUM 100 MG PO CAPS
100.0000 mg | ORAL_CAPSULE | Freq: Two times a day (BID) | ORAL | Status: DC
  Administered 2013-09-12 – 2013-09-13 (×3): 100 mg via ORAL

## 2013-09-12 NOTE — Progress Notes (Signed)
09/12/2013 1400 Waiting final recommendations for home. Pt may need inpt rehab. DME ordered with [facility].  Jonnie Finner RN CM Case Mgmt phone [phone]

## 2013-09-12 NOTE — Progress Notes (Signed)
   Subjective: 1 Day Post-Op Procedure(s) (LRB): OPEN REDUCTION INTERNAL FIXATION (ORIF) ANKLE FRACTURE (Right)  Pt doing well Mild pain Only taking tylenol for pain relief Patient reports pain as mild.  Objective:   VITALS:   Filed Vitals:   09/12/13 0513  BP: 124/56  Pulse: 66  Temp: 97.8 F (36.6 C)  Resp: 16    Right lower extremity in splint nv intact distally No rashes or edema  LABS  Recent Labs  09/11/13 1456  HGB 12.4  HCT 37.3  WBC 9.8  PLT 243     Recent Labs  09/11/13 1456 09/12/13 0508  NA 140 139  K 3.3* 3.5*  BUN 19 15  CREATININE 1.04 0.97  GLUCOSE 100* 154*     Assessment/Plan: 1 Day Post-Op Procedure(s) (LRB): OPEN REDUCTION INTERNAL FIXATION (ORIF) ANKLE FRACTURE (Right) PT/OT today Non weight bearing right lower extremity Plan for d/c tomorrow     Merla Riches, MPAS, PA-C  09/12/2013, 8:16 AM

## 2013-09-12 NOTE — Evaluation (Signed)
Physical Therapy Evaluation Patient Details Name: ANADIA KURDI MRN: [id number] DOB: 06/22/1931 Today's Date: 09/12/2013   History of Present Illness  Pt is s/p fall and ORIF R ankle. Pt with PMH of HTN, arthritis and rotator cuff repair.  Clinical Impression  Pt will most likely be WC mobile  Initially. Pt would benefit from post acute rehab . Pt agreeable. Pt will benefit from PT to address problems listed in note below.    Follow Up Recommendations CIR    Equipment Recommendations  Rolling walker with 5" wheels    Recommendations for Other Services Rehab consult     Precautions / Restrictions Precautions Precautions: Fall Restrictions RLE Weight Bearing: Non weight bearing      Mobility  Bed Mobility   Bed Mobility: Sit to Supine       Sit to supine: Mod assist   General bed mobility comments: support R LE  to lower from recliner, caution for NWB, assist R leg onto bed..   Transfers Overall transfer level: Needs assistance Equipment used: Rolling walker (2 wheeled) Transfers: Sit to/from [facility] Sit to Stand: Mod assist;From elevated surface Stand pivot transfers: Mod assist       General transfer comment: verbal cues for hand placement , NWB, Pt's cast touched ground x 3 with attempts to hop.  Ambulation/Gait                Stairs            Wheelchair Mobility    Modified Rankin (Stroke Patients Only)       Balance                                             Pertinent Vitals/Pain Pain Score: 7  Pain Location: had been in recliner with leg dependent, stated improved with elevation Pain Descriptors / Indicators: Aching;Burning;Crushing;Tightness Pain Intervention(s): Limited activity within patient's tolerance;Monitored during session;Premedicated before session;Ice applied;Repositioned    Home Living Family/patient expects to be discharged to:: Inpatient rehab Living Arrangements:  Spouse/significant other Available Help at Discharge: Family Type of Home: House Home Access: Level entry     Home Layout: Able to live on main level with bedroom/bathroom Home Equipment: None      Prior Function Level of Independence: Independent               Hand Dominance        Extremity/Trunk Assessment               Lower Extremity Assessment: LLE deficits/detail;RLE deficits/detail;Overall WFL for tasks assessed RLE Deficits / Details: LLC intact       Communication   Communication: No difficulties  Cognition Arousal/Alertness: Awake/alert Behavior During Therapy: WFL for tasks assessed/performed Overall Cognitive Status: Within Functional Limits for tasks assessed                      General Comments      Exercises        Assessment/Plan    PT Assessment Patient needs continued PT services  PT Diagnosis Difficulty walking;Acute pain   PT Problem List Decreased strength;Decreased activity tolerance;Decreased mobility;Pain;Decreased knowledge of precautions;Decreased knowledge of use of DME;Decreased safety awareness  PT Treatment Interventions DME instruction;Gait training;Functional mobility training;Therapeutic exercise;Therapeutic activities;Patient/family education   PT Goals (Current goals can be found in the Care Plan section) Acute Rehab  PT Goals Patient Stated Goal: be independent PT Goal Formulation: With patient Time For Goal Achievement: 09/26/13 Potential to Achieve Goals: Good    Frequency Min 4X/week   Barriers to discharge Decreased caregiver support      Co-evaluation               End of Session Equipment Utilized During Treatment: Gait belt Activity Tolerance: Patient limited by fatigue;Patient limited by pain Patient left: in bed;with call bell/phone within reach Nurse Communication: Mobility status         Time: 1003-1019 PT Time Calculation (min): 16 min   Charges:   PT  Evaluation $Initial PT Evaluation Tier I: 1 Procedure PT Treatments $Therapeutic Activity: 8-22 mins   PT G Codes:          Claretha Cooper 09/12/2013, 2:48 PM

## 2013-09-12 NOTE — Progress Notes (Signed)
Pt is concerned re: home situation & is eager to look into short SNF stay for rehab. She is also thinking about having a motorized w/c ("scooter"). Will inform case mgr. Josedaniel Haye, [employer]

## 2013-09-12 NOTE — Evaluation (Addendum)
Occupational Therapy Evaluation Patient Details Name: [name] [name] MRN: [id number] DOB: April 25, 1931 Today's Date: 09/12/2013    History of Present Illness Pt is s/p fall and ORIF R ankle. Pt with PMH of HTN, arthritis and rotator cuff repair.   Clinical Impression   Pt up to EOB for bathing and then transferred with walker to [facility] and to chair. Pt stating R LE "feels heavy" with trying to keep NWB and fatigues easily after multiple transfers. Feel she will benefit from continued OT services to progress safety and independence with self care tasks for next venue.    Follow Up Recommendations  CIR;Supervision/Assistance - 24 hour    Equipment Recommendations  3 in 1 bedside comode    Recommendations for Other Services       Precautions / Restrictions Precautions Precautions: Fall Restrictions Weight Bearing Restrictions: Yes RLE Weight Bearing: Non weight bearing      Mobility Bed Mobility Overal bed mobility: Needs Assistance Bed Mobility: Supine to Sit     Supine to sit: Min assist;HOB elevated     General bed mobility comments: support R LE over the EOB to floor.   Transfers Overall transfer level: Needs assistance Equipment used: Rolling walker (2 wheeled) Transfers: Sit to/from Stand Sit to Stand: Min assist;From elevated surface         General transfer comment: verbal cues for hand placement with bed elevated.     Balance                                            ADL Overall ADL's : Needs assistance/impaired Eating/Feeding: Independent;Bed level   Grooming: Wash/dry hands;Set up;Sitting   Upper Body Bathing: Supervision/ safety;Set up;Sitting   Lower Body Bathing: +2 for safety/equipment;Moderate assistance;Sit to/from stand/lean side to side.   Upper Body Dressing : Supervision/safety;Set up;Sitting   Lower Body Dressing: +2 for safety/equipment;Sit to/from stand;Maximal assistance   Toilet Transfer: +2 for  safety/equipment;Moderate assistance;RW;Stand-pivot   Toileting- Clothing Manipulation and Hygiene: +2 for safety/equipment;Maximal assistance;Sit to/from stand         General ADL Comments: Pt noted to have bleeding from IV site when OT arrived. Nursing came to room and addressed issue. Pt states repeated her R LE "feels heavy" and fatigued with 2 pivot tranfers from bed to bsc and then to chair with having to put so much weight through arms. Pulled BSC up behind her after she did pivot as she was started to fatigue. Did better with backward "hop" steps to get to recliner. Used +2 for safety with second pivot to chair for safety as pt fatiguing more and stating R LE "feeling heavy." Feel going home would be a safety concern at this point so recommend CIR. She did well with leaning side to side to wash periareas in sitting but needs assist for standing component to wipe periarea thoroughly as she needs UEs on walker to support self and maintain NWB.      Vision                     Perception     Praxis      Pertinent Vitals/Pain Pain Assessment: 0-10 Pain Score: 4  Pain Descriptors / Indicators: Aching;Heaviness Pain Intervention(s): Repositioned;Other (comment) (elevated extremity)     Hand Dominance     Extremity/Trunk Assessment Upper Extremity Assessment Upper Extremity Assessment: Overall WFL for tasks  assessed           Communication Communication Communication: No difficulties   Cognition Arousal/Alertness: Awake/alert Behavior During Therapy: WFL for tasks assessed/performed Overall Cognitive Status: Within Functional Limits for tasks assessed                     General Comments       Exercises       Shoulder Instructions      Home Living Family/patient expects to be discharged to:: Inpatient rehab Living Arrangements: Spouse/significant other Available Help at Discharge: Family Type of Home: House Home Access: Level entry     Home  Layout: Able to live on main level with bedroom/bathroom     Bathroom Shower/Tub: Occupational psychologist: Standard     Home Equipment: None          Prior Functioning/Environment Level of Independence: Independent             OT Diagnosis: Generalized weakness   OT Problem List: Decreased strength;Decreased knowledge of use of DME or AE   OT Treatment/Interventions: Self-care/ADL training;Patient/family education;Therapeutic activities;DME and/or AE instruction    OT Goals(Current goals can be found in the care plan section) Acute Rehab OT Goals Patient Stated Goal: be independent OT Goal Formulation: With patient Time For Goal Achievement: 09/19/13 Potential to Achieve Goals: Good ADL Goals Pt Will Perform Lower Body Bathing: with min guard assist;with adaptive equipment;sit to/from stand Pt Will Perform Lower Body Dressing: with min guard assist;sit to/from stand;with adaptive equipment Pt Will Transfer to Toilet: with min guard assist;bedside commode;stand pivot transfer Pt Will Perform Toileting - Clothing Manipulation and hygiene: with supervision;with min guard assist;sit to/from stand Additional ADL Goal #1: Pt will transfer to EOB with supervision for ADL participation.  OT Frequency: Min 2X/week   Barriers to D/C:            Co-evaluation              End of Session Equipment Utilized During Treatment: Gait belt;Rolling walker  Activity Tolerance: Patient limited by fatigue Patient left: in chair;with call bell/phone within reach   Time: 0843-0920 OT Time Calculation (min): 37 min Charges:  OT General Charges $OT Visit: 1 Procedure OT Evaluation $Initial OT Evaluation Tier I: 1 Procedure OT Treatments $Self Care/Home Management : 8-22 mins $Therapeutic Activity: 8-22 mins G-Codes:    Jules Schick [phone] 09/12/2013, 11:45 AM

## 2013-09-12 NOTE — Progress Notes (Signed)
Rehab Admissions Coordinator Note:  Patient was screened by [name] for appropriateness for an Inpatient Acute Rehab Consult per OT recommendation. At this time, we are recommending [facility] or [facility]. Doubt medical necessity for an in hospital rehab admission can be justified.  [name] 09/12/2013, 11:42 AM  I can be reached at [phone].

## 2013-09-13 ENCOUNTER — Encounter (HOSPITAL_COMMUNITY): Payer: Self-pay | Admitting: Orthopedic Surgery

## 2013-09-13 LAB — BASIC METABOLIC PANEL
Anion gap: 11 (ref 5–15)
BUN: 18 mg/dL (ref 6–23)
CALCIUM: 9 mg/dL (ref 8.4–10.5)
CO2: 30 mEq/L (ref 19–32)
CREATININE: 1.01 mg/dL (ref 0.50–1.10)
Chloride: 100 mEq/L (ref 96–112)
GFR calc Af Amer: 60 mL/min — ABNORMAL LOW (ref >90)
GFR calc non Af Amer: 52 mL/min — ABNORMAL LOW (ref >90)
GLUCOSE: 112 mg/dL — AB (ref 70–99)
Potassium: 3.3 mEq/L — ABNORMAL LOW (ref 3.7–5.3)
Sodium: 141 mEq/L (ref 137–147)

## 2013-09-13 MED ORDER — HYDROCODONE-ACETAMINOPHEN 5-325 MG PO TABS: 1.0000 | ORAL_TABLET | ORAL | Status: DC | PRN

## 2013-09-13 NOTE — Progress Notes (Signed)
Clinical Social Work Department BRIEF PSYCHOSOCIAL ASSESSMENT 09/13/2013  Patient:  [name] [name], CIMMINO     Account Number:  [id number]     Admit date:  09/11/2013  Clinical Social Worker:  Lacie Scotts  Date/Time:  09/13/2013 01:04 PM  Referred by:  CSW  Date Referred:  09/13/2013 Referred for  SNF Placement   Other Referral:   Interview type:  Patient Other interview type:    PSYCHOSOCIAL DATA Living Status:  HUSBAND Admitted from facility:   Level of care:   Primary support name:  Jeneen Rinks Primary support relationship to patient:  SPOUSE Degree of support available:   supportive    CURRENT CONCERNS Current Concerns  Post-Acute Placement   Other Concerns:    SOCIAL WORK ASSESSMENT / PLAN Pt is a 80 yr old female living at home prior to hospitalization. CSW met with pt to assist with d/c planning. ST Rehab is needed following hospital d/c. CIR screened pt but felt pt did not require in patient rehab. Pt requested [facility] for rehab. Pt has BCBS FED which does not have a SNF benefit. Pt also has Medicare part A which will assist with the cost of SNF. [facility] has provided abed offer . Pt will transport there today via [facility].   Assessment/plan status:  Psychosocial Support/Ongoing Assessment of Needs Other assessment/ plan:   Information/referral to community resources:   Insurance coverage for SNF and ambulance transport reviewed.    PATIENT'S/FAMILY'S RESPONSE TO PLAN OF CARE: Pt was pleased that her medicare will assist with placement. She is in agreement with d/c to SNF today via [facility]. Her mood is bright , pain is being controlled and she is motivated to begin rehab.   Werner Lean LCSW [phone]

## 2013-09-13 NOTE — Progress Notes (Signed)
[facility]  [facility] is providing the following services: RW  If patient discharges after hours, please call [phone].   Linward Headland 09/13/2013, 11:09 AM

## 2013-09-13 NOTE — Progress Notes (Signed)
Pt d/c to [facility]. Report given to the nurse at the facility.

## 2013-09-13 NOTE — Progress Notes (Signed)
Clinical Social Work Department CLINICAL SOCIAL WORK PLACEMENT NOTE 09/13/2013  Patient:  [name] [name], [name] [name]  Account Number:  [id number] Admit date:  09/11/2013  Clinical Social Worker:  Werner Lean, LCSW  Date/time:  09/13/2013 01:14 PM  Clinical Social Work is seeking post-discharge placement for this patient at the following level of care:   SKILLED NURSING   (*CSW will update this form in Epic as items are completed)   09/13/2013  Patient/family provided with [place] Department of Clinical Social Work's list of facilities offering this level of care within the geographic area requested by the patient (or if unable, by the patient's family).  09/13/2013  Patient/family informed of their freedom to choose among providers that offer the needed level of care, that participate in Medicare, Medicaid or managed care program needed by the patient, have an available bed and are willing to accept the patient.    Patient/family informed of [facility]' ownership interest in [facility], as well as of the fact that they are under no obligation to receive care at this facility.  PASARR submitted to EDS on 09/12/2013 PASARR number received on 09/12/2013  FL2 transmitted to all facilities in geographic area requested by pt/family on  09/13/2013 FL2 transmitted to all facilities within larger geographic area on   Patient informed that his/her managed care company has contracts with or will negotiate with  certain facilities, including the following:     Patient/family informed of bed offers received:  09/13/2013 Patient chooses bed at [facility] Physician recommends and patient chooses bed at    Patient to be transferred to [facility] on  09/13/2013 Patient to be transferred to facility by [facility] Patient and family notified of transfer on 09/13/2013 Name of family member notified:  Pt declined csw assistance.  The following physician request were entered in  Epic:   Additional Comments: NSG reviewed d/c summay, scripts,avs. Scripts are included in d/c packet.  Werner Lean LCSW [phone]

## 2013-09-13 NOTE — Progress Notes (Signed)
   Subjective: 2 Days Post-Op Procedure(s) (LRB): OPEN REDUCTION INTERNAL FIXATION (ORIF) ANKLE FRACTURE (Right)  Pt c/o mild pain but c/o heavy splint and very hard time trying to use a walker Discussed short term SNF placement and pt agreeable Patient reports pain as mild.  Objective:   VITALS:   Filed Vitals:   09/13/13 0525  BP: 129/56  Pulse: 82  Temp: 97.6 F (36.4 C)  Resp: 16    Right lower extremity in splint nv intact distally  LABS  Recent Labs  09/11/13 1456  HGB 12.4  HCT 37.3  WBC 9.8  PLT 243     Recent Labs  09/11/13 1456 09/12/13 0508 09/13/13 0534  NA 140 139 141  K 3.3* 3.5* 3.3*  BUN 19 15 18   CREATININE 1.04 0.97 1.01  GLUCOSE 100* 154* 112*     Assessment/Plan: 2 Days Post-Op Procedure(s) (LRB): OPEN REDUCTION INTERNAL FIXATION (ORIF) ANKLE FRACTURE (Right)  Will plan for short term SNF placement Pt is available to go once bed available Continue PT/OT non weight bearing right lower extremity   Merla Riches, MPAS, PA-C  09/13/2013, 6:51 AM

## 2013-09-13 NOTE — Discharge Summary (Signed)
Physician Discharge Summary   Patient ID: JUNIA PYUN MRN: [id number] DOB/AGE: October 20, 1931 80 y.o.  Admit date: 09/11/2013 Discharge date: 09/13/2013  Admission Diagnoses:  Active Problems:   Fracture dislocation of ankle joint   Bimalleolar ankle fracture   Discharge Diagnoses:  Same   Surgeries: Procedure(s): OPEN REDUCTION INTERNAL FIXATION (ORIF) ANKLE FRACTURE on 09/11/2013   Consultants: PT/OT, case management  Discharged Condition: Stable  Hospital Course: [name] [name] is an 80 y.o. female who was admitted 09/11/2013 with a chief complaint of  Chief Complaint  Patient presents with  . Leg Injury  , and found to have a diagnosis of <principal problem not specified>.  They were brought to the operating room on 09/11/2013 and underwent the above named procedures.    The patient had an uncomplicated hospital course and was stable for discharge.  Recent vital signs:  Filed Vitals:   09/13/13 0525  BP: 129/56  Pulse: 82  Temp: 97.6 F (36.4 C)  Resp: 16    Recent laboratory studies:  Results for orders placed during the hospital encounter of 09/11/13  SURGICAL PCR SCREEN      Result Value Ref Range   MRSA, PCR NEGATIVE  NEGATIVE   Staphylococcus aureus NEGATIVE  NEGATIVE  BASIC METABOLIC PANEL      Result Value Ref Range   Sodium 140  137 - 147 mEq/L   Potassium 3.3 (*) 3.7 - 5.3 mEq/L   Chloride 97  96 - 112 mEq/L   CO2 30  19 - 32 mEq/L   Glucose, Bld 100 (*) 70 - 99 mg/dL   BUN 19  6 - 23 mg/dL   Creatinine, Ser 1.04  0.50 - 1.10 mg/dL   Calcium 9.6  8.4 - 10.5 mg/dL   GFR calc non Af Amer 50 (*) >90 mL/min   GFR calc Af Amer 58 (*) >90 mL/min   Anion gap 13  5 - 15  CBC      Result Value Ref Range   WBC 9.8  4.0 - 10.5 K/uL   RBC 4.19  3.87 - 5.11 MIL/uL   Hemoglobin 12.4  12.0 - 15.0 g/dL   HCT 37.3  36.0 - 46.0 %   MCV 89.0  78.0 - 100.0 fL   MCH 29.6  26.0 - 34.0 pg   MCHC 33.2  30.0 - 36.0 g/dL   RDW 13.3  11.5 - 15.5 %   Platelets 243   150 - 400 K/uL  BASIC METABOLIC PANEL      Result Value Ref Range   Sodium 139  137 - 147 mEq/L   Potassium 3.5 (*) 3.7 - 5.3 mEq/L   Chloride 98  96 - 112 mEq/L   CO2 28  19 - 32 mEq/L   Glucose, Bld 154 (*) 70 - 99 mg/dL   BUN 15  6 - 23 mg/dL   Creatinine, Ser 0.97  0.50 - 1.10 mg/dL   Calcium 9.1  8.4 - 10.5 mg/dL   GFR calc non Af Amer 54 (*) >90 mL/min   GFR calc Af Amer 63 (*) >90 mL/min   Anion gap 13  5 - 15  BASIC METABOLIC PANEL      Result Value Ref Range   Sodium 141  137 - 147 mEq/L   Potassium 3.3 (*) 3.7 - 5.3 mEq/L   Chloride 100  96 - 112 mEq/L   CO2 30  19 - 32 mEq/L   Glucose, Bld 112 (*) 70 - 99  mg/dL   BUN 18  6 - 23 mg/dL   Creatinine, Ser 1.01  0.50 - 1.10 mg/dL   Calcium 9.0  8.4 - 10.5 mg/dL   GFR calc non Af Amer 52 (*) >90 mL/min   GFR calc Af Amer 60 (*) >90 mL/min   Anion gap 11  5 - 15  TYPE AND SCREEN      Result Value Ref Range   ABO/RH(D) O POS     Antibody Screen NEG     Sample Expiration 09/14/2013    ABO/RH      Result Value Ref Range   ABO/RH(D) O POS      Discharge Medications:     Medication List    ASK your doctor about these medications       CALCIUM PO  Take 1,000 mg by mouth daily.     cetirizine 10 MG tablet  Commonly known as:  ZYRTEC  Take 10 mg by mouth daily.     clopidogrel 75 MG tablet  Commonly known as:  PLAVIX  Take 1 tablet (75 mg total) by mouth daily with breakfast.     ezetimibe 10 MG tablet  Commonly known as:  ZETIA  Take 10 mg by mouth daily.     hydrochlorothiazide 25 MG tablet  Commonly known as:  HYDRODIURIL  Take 25 mg by mouth daily.     ibuprofen 200 MG tablet  Commonly known as:  ADVIL,MOTRIN  Take 400 mg by mouth every 6 (six) hours as needed for moderate pain.     metoprolol succinate 25 MG 24 hr tablet  Commonly known as:  TOPROL-XL  Take 1 tablet by mouth daily.     mometasone 50 MCG/ACT nasal spray  Commonly known as:  NASONEX  Place 2 sprays into the nose daily.      multivitamin capsule  Take 1 capsule by mouth daily.        Diagnostic Studies: Dg Ankle 2 Views Right  09/26/2013   CLINICAL DATA:  Status post ORIF of the right ankle.  EXAM: RIGHT ANKLE - 2 VIEW  COMPARISON:  In plaster views of the ankle of today's date.  FINDINGS: The patient has undergone ORIF for a displaced medial malleolar fracture and a oblique fracture of the distal fibula. The metallic hardware is intact. Alignment is now anatomic.  IMPRESSION: The patient undergone successful ORIF of distal fibular and medial malleolar fractures.   Electronically Signed   By: David  Martinique   On: 2013-09-26 17:24   Dg Ankle Complete Right  09-26-13   CLINICAL DATA:  Fall with right ankle dislocation status post reduction.  EXAM: RIGHT ANKLE - COMPLETE 3+ VIEW  COMPARISON:  None.  FINDINGS: No comparison studies are available. Bone detail is limited by a plaster splint. There is an oblique fracture of the distal fibula with associated posterolateral displacement. There is a laterally displaced fracture involving the medial malleolus. There is posterolateral subluxation of the talus with respect to the tibial plafond. No tarsal bone fractures are observed.  IMPRESSION: Right ankle fracture subluxation as described with persistent posterolateral subluxation of the talus status post closed reduction. Bone detail obscured by the splint.   Electronically Signed   By: Camie Patience M.D.   On: 09/26/2013 10:59   Dg Ankle Right Port  09-26-13   CLINICAL DATA:  Right ankle fracture.  EXAM: PORTABLE RIGHT ANKLE - 2 VIEW  COMPARISON:  Same day.  FINDINGS: The joint has been casted and immobilized.  Successful reduction of the talar dislocation is noted which is significantly improved compared to prior exam.  IMPRESSION: Successful reduction of talar dislocation.   Electronically Signed   By: Sabino Dick M.D.   On: 09/11/2013 13:14   Dg C-arm 1-60 Min-no Report  09/11/2013   CLINICAL DATA: ORIF right ankle   C-ARM  1-60 MINUTES  Fluoroscopy was utilized by the requesting physician.  No radiographic  interpretation.     Disposition: 01-Home or Self Care       Signed: Ott Zimmerle B 09/13/2013, 6:52 AM

## 2013-09-14 ENCOUNTER — Non-Acute Institutional Stay (SKILLED_NURSING_FACILITY): Payer: Federal, State, Local not specified - PPO | Admitting: Internal Medicine

## 2013-09-14 ENCOUNTER — Other Ambulatory Visit: Payer: Medicare Other

## 2013-09-14 DIAGNOSIS — S82841S Displaced bimalleolar fracture of right lower leg, sequela: Secondary | ICD-10-CM

## 2013-09-14 DIAGNOSIS — K5900 Constipation, unspecified: Secondary | ICD-10-CM

## 2013-09-14 DIAGNOSIS — S8290XS Unspecified fracture of unspecified lower leg, sequela: Secondary | ICD-10-CM

## 2013-09-14 DIAGNOSIS — I10 Essential (primary) hypertension: Secondary | ICD-10-CM

## 2013-09-14 DIAGNOSIS — J309 Allergic rhinitis, unspecified: Secondary | ICD-10-CM

## 2013-09-15 DIAGNOSIS — J309 Allergic rhinitis, unspecified: Secondary | ICD-10-CM | POA: Insufficient documentation

## 2013-09-15 DIAGNOSIS — K5900 Constipation, unspecified: Secondary | ICD-10-CM | POA: Insufficient documentation

## 2013-09-15 NOTE — Progress Notes (Signed)
HISTORY & PHYSICAL  DATE: 09/14/2013   FACILITY: [facility]  LEVEL OF CARE: SNF (31)  ALLERGIES:  Allergies  Allergen Reactions  . Aspirin Anaphylaxis  . Ceclor [Cefaclor] Anaphylaxis  . Statins   . Penicillins Rash  . Sulfa Antibiotics Rash    CHIEF COMPLAINT:  Manage right ankle fracture, hypertension and allergic rhinitis  HISTORY OF PRESENT ILLNESS: [name]  ANKLE FRACTURE: The patient had a mechanical fall and sustained an ankle fracture. Patient underwent surgical repair and tolerated the procedure well. Patient is admitted to this facility for short-term rehabilitation. The patient denies ongoing pain. No complications reported from the current medication(s) being used.  HTN: Pt 's HTN remains stable.  Denies CP, sob, DOE, pedal edema, headaches, dizziness or visual disturbances.  No complications from the medications currently being used.  Last BP : 133/79  ALLERGIC RHINITIS: Allergic rhinitis remains stable.  Patient denies ongoing symptoms such as runny nose sneezing or tearing. No complications reported from the current medication(s) being used.  PAST MEDICAL HISTORY :  Past Medical History  Diagnosis Date  . Arthritis   . Hyperlipidemia   . Hypertension   . Lipoma     left arm  . Nasal polyps     PAST SURGICAL HISTORY: Past Surgical History  Procedure Laterality Date  . Rotator cuff repair  09/27/2003  . Cholecystectomy  02/27/04  . Trigger finger release  10/29/06  . Incontinence surgery  09/12/09  . Orif ankle fracture Right 09/11/2013    Procedure: OPEN REDUCTION INTERNAL FIXATION (ORIF) ANKLE FRACTURE;  Surgeon: Augustin Schooling, MD;  Location: [facility];  Service: Orthopedics;  Laterality: Right;    SOCIAL HISTORY:  reports that she has never smoked. She does not have any smokeless tobacco history on file. She reports that she does not drink alcohol or use illicit drugs.  FAMILY HISTORY:    Family History  Problem Relation Age of Onset  . Cancer Maternal Grandmother     ovarian    CURRENT MEDICATIONS: Reviewed per MAR/see medication list  REVIEW OF SYSTEMS:  GI -complains of constipation, See HPI otherwise 14 point ROS is negative.  PHYSICAL EXAMINATION  VS:  See VS section  GENERAL: no acute distress, normal body habitus EYES: conjunctivae normal, sclerae normal, normal eye lids MOUTH/THROAT: lips without lesions,no lesions in the mouth,tongue is without lesions,uvula elevates in midline NECK: supple, trachea midline, no neck masses, no thyroid tenderness, no thyromegaly LYMPHATICS: no LAN in the neck, no supraclavicular LAN RESPIRATORY: breathing is even & unlabored, BS CTAB CARDIAC: RRR, no murmur,no extra heart sounds, no edema GI:  ABDOMEN: abdomen soft, normal BS, no masses, no tenderness  LIVER/SPLEEN: no hepatomegaly, no splenomegaly MUSCULOSKELETAL: HEAD: normal to inspection  EXTREMITIES: LEFT UPPER EXTREMITY: full range of motion, normal strength & tone RIGHT UPPER EXTREMITY:  full range of motion, normal strength & tone LEFT LOWER EXTREMITY:  full range of motion, normal strength & tone RIGHT LOWER EXTREMITY: Not tested-has a cast PSYCHIATRIC: the patient is alert & oriented to person, affect & behavior appropriate  LABS/RADIOLOGY:  Labs reviewed: Basic Metabolic Panel:  Recent Labs  09/11/13 1456 09/12/13 0508 09/13/13 0534  NA 140 139 141  K 3.3* 3.5* 3.3*  CL 97 98 100  CO2 30 28 30   GLUCOSE 100* 154* 112*  BUN 19 15 18   CREATININE 1.04 0.97 1.01  CALCIUM 9.6 9.1 9.0   CBC:  Recent Labs  09/11/13 1456  WBC 9.8  HGB 12.4  HCT 37.3  MCV 89.0  PLT 243    Transthoracic Echocardiography  Patient:    [name], [name] MR #:       [id number] Study Date: 04/05/2013 Gender:     F Age:        32 Height:     154.9cm Weight:     60.8kg BSA:        1.24m^2 Pt. Status: Room:    ATTENDING    Delene Ruffini,  Mark  REFERRING    Candee Furbish  SONOGRAPHER  Victorio Palm, RDCS  PERFORMING   [facility], Outpatient cc:  ------------------------------------------------------------ LV EF: 60% -   65%  ------------------------------------------------------------ Indications:      Murmur 785.2.  ------------------------------------------------------------ History:   PMH:  PVCs. Acquired from the patient and from the patient's chart. 2/6 Systolic murmur.  Risk factors: Hypertension. Dyslipidemia.  ------------------------------------------------------------ Study Conclusions  - Left ventricle: The cavity size was normal. There was mild   concentric hypertrophy. Systolic function was normal. The   estimated ejection fraction was in the range of 60% to   65%. Wall motion was normal; there were no regional wall   motion abnormalities. Doppler parameters are consistent   with abnormal left ventricular relaxation (grade 1   diastolic dysfunction). - Aortic valve: Mildly calcified leaflets. Peak and mean   gradients of 22 mmHg and 11 mmHG. Based on an LVOT   diameter of 2.1 cm, the calculated AVA is 1.5-1.6 cm2,   suggestive of mild aortic stenosis. Mean gradient: 46mm Hg   (S). Peak gradient: 51mm Hg (S). - Mitral valve: Calcified annulus. Trivial regurgitation. - Left atrium: Mildly dilated (31 ml/m2). - Right atrium: The atrium was normal in size. - Tricuspid valve: Mild regurgitation. - Inferior vena cava: The vessel was normal in size; the   respirophasic diameter changes were in the normal range (=   50%); findings are consistent with normal central venous   pressure. - Pericardium, extracardiac: There was no pericardial   effusion. Transthoracic echocardiography.  M-mode, complete 2D, spectral Doppler, and color Doppler.  Height:  Height: 154.9cm. Height: 61in.  Weight:  Weight: 60.8kg. Weight: 133.7lb.  Body mass index:  BMI: 25.3kg/m^2.  Body surface area:    BSA: 1.72m^2.  Blood  pressure:     120/75.  Patient status:  Outpatient.  Location:  [facility]  ------------------------------------------------------------  ------------------------------------------------------------ Left ventricle:  The cavity size was normal. There was mild concentric hypertrophy. Systolic function was normal. The estimated ejection fraction was in the range of 60% to 65%. Wall motion was normal; there were no regional wall motion abnormalities. Doppler parameters are consistent with abnormal left ventricular relaxation (grade 1 diastolic dysfunction). The E/e' ratio is >10, suggesting elevated LV filling pressure.  ------------------------------------------------------------ Aortic valve:   Mildly calcified leaflets. Peak and mean gradients of 22 mmHg and 11 mmHG. Based on an LVOT diameter of 2.1 cm, the calculated AVA is 1.5-1.6 cm2, suggestive of mild aortic stenosis.  Doppler:     VTI ratio of LVOT to aortic valve: 0.44. Valve area: 1.53cm^2(VTI). Indexed valve area: 0.96cm^2/m^2 (VTI). Peak velocity ratio of LVOT to aortic valve: 0.44. Valve area: 1.52cm^2 (Vmax). Indexed valve area: 0.96cm^2/m^2 (Vmax).    Mean gradient: 54mm Hg (S). Peak gradient: 40mm Hg (S).  ------------------------------------------------------------ Aorta:  Aortic root: The aortic root was normal in size. Ascending aorta: The ascending aorta  was normal in size.  ------------------------------------------------------------ Mitral valve:   Calcified annulus.  Doppler:   Trivial regurgitation.  ------------------------------------------------------------ Left atrium:  Mildly dilated (31 ml/m2).  ------------------------------------------------------------ Right ventricle:  The cavity size was normal. Wall thickness was normal. Systolic function was normal.  ------------------------------------------------------------ Pulmonic valve:    The valve appears to be grossly normal.  Doppler:    Trivial regurgitation.  ------------------------------------------------------------ Tricuspid valve:   Doppler:   Mild regurgitation.  ------------------------------------------------------------ Pulmonary artery:   The main pulmonary artery was normal-sized.  ------------------------------------------------------------ Right atrium:  The atrium was normal in size.  ------------------------------------------------------------ Pericardium:  There was no pericardial effusion.  ------------------------------------------------------------ Systemic veins: Inferior vena cava: The vessel was normal in size; the respirophasic diameter changes were in the normal range (= 50%); findings are consistent with normal central venous pressure.  ------------------------------------------------------------  2D measurements        Normal  Doppler measurements   Normal Left ventricle                 Main pulmonary LVID ED,   26.2 mm     43-52   artery chord,                         Pressure,    26 mm Hg  =30 PLAX                           S LVID ES,   18.1 mm     23-38   Left ventricle chord,                         Ea, lat    6.36 cm/s   ------ PLAX                           ann, tiss FS, chord,   31 %      >29     DP PLAX                           E/Ea, lat  10.7        ------ LVPW, ED   10.2 mm     ------  ann, tiss     1 IVS/LVPW   1.26        <1.3    DP ratio, ED                      Ea, med    4.93 cm/s   ------ Ventricular septum             ann, tiss IVS, ED    12.9 mm     ------  DP LVOT                           E/Ea, med  13.8        ------ Diam, S      21 mm     ------  ann, tiss     1 Area       3.46 cm^2   ------  DP Diam         20 mm     ------  LVOT Aorta  Peak vel,   102 cm/s   ------ Root diam,   28 mm     ------  S ED                             VTI, S       22 cm     ------ Left atrium                    Stroke vol 76.2 ml     ------ AP dim        32 mm     ------  Stroke     47.9 ml/m^2 ------ AP dim     2.01 cm/m^2 <2.2    index index                          Aortic valve                                Peak vel,   232 cm/s   ------                                S                                Mean vel,   164 cm/s   ------                                S                                VTI, S     49.8 cm     ------                                Mean         12 mm Hg  ------                                gradient,                                S                                Peak         22 mm Hg  ------                                gradient,                                S                                VTI  ratio  0.44        ------                                LVOT/AV                                Area, VTI  1.53 cm^2   ------                                Area index 0.96 cm^2/m ------                                (VTI)           ^2                                Peak vel   0.44        ------                                ratio,                                LVOT/AV                                Area, Vmax 1.52 cm^2   ------                                Area index 0.96 cm^2/m ------                                (Vmax)          ^2                                Mitral valve                                Peak E vel 68.1 cm/s   ------                                Peak A vel 95.3 cm/s   ------                                Decelerati  187 ms     150-23                                on time                0  Peak E/A    0.7        ------                                ratio                                Tricuspid valve                                Regurg      238 cm/s   ------                                peak vel                                Peak RV-RA   23 mm Hg  ------                                gradient,                                S                                 Systemic veins                                Estimated     3 mm Hg  ------                                CVP                                Right ventricle                                Pressure,    26 mm Hg  <30                                S                                Sa vel,    12.7 cm/s   ------                                lat ann,                                tiss DP   RIGHT ANKLE - COMPLETE 3+ VIEW   COMPARISON:  None.   FINDINGS: No comparison studies are available. Bone detail  is limited by a plaster splint. There is an oblique fracture of the distal fibula with associated posterolateral displacement. There is a laterally displaced fracture involving the medial malleolus. There is posterolateral subluxation of the talus with respect to the tibial plafond. No tarsal bone fractures are observed.   IMPRESSION: Right ankle fracture subluxation as described with persistent posterolateral subluxation of the talus status post closed reduction. Bone detail obscured by the splint. PORTABLE RIGHT ANKLE - 2 VIEW   COMPARISON:  Same day.   FINDINGS: The joint has been casted and immobilized. Successful reduction of the talar dislocation is noted which is significantly improved compared to prior exam.   IMPRESSION: Successful reduction of talar dislocation.   RIGHT ANKLE - 2 VIEW   COMPARISON:  In plaster views of the ankle of today's date.   FINDINGS: The patient has undergone ORIF for a displaced medial malleolar fracture and a oblique fracture of the distal fibula. The metallic hardware is intact. Alignment is now anatomic.   IMPRESSION: The patient undergone successful ORIF of distal fibular and medial malleolar fractures.   ASSESSMENT/PLAN:  Right ankle fracture-status post ORIF. Continue rehabilitation. Hypertension-well controlled. Allergic rhinitis-well controlled. Constipation-new problem. Start MiraLax 17 g  daily. Hypokalemia-recheck Hyperlipidemia-continue zetia Check BMP  I have reviewed patient's medical records received at admission/from hospitalization.  CPT CODE: 02725  Gayani Y Dasanayaka, [place] [phone]

## 2013-09-25 ENCOUNTER — Non-Acute Institutional Stay (SKILLED_NURSING_FACILITY): Payer: Federal, State, Local not specified - PPO | Admitting: Adult Health

## 2013-09-25 ENCOUNTER — Encounter: Payer: Self-pay | Admitting: Adult Health

## 2013-09-25 DIAGNOSIS — I10 Essential (primary) hypertension: Secondary | ICD-10-CM

## 2013-09-25 DIAGNOSIS — K5900 Constipation, unspecified: Secondary | ICD-10-CM

## 2013-09-25 DIAGNOSIS — S82841S Displaced bimalleolar fracture of right lower leg, sequela: Secondary | ICD-10-CM

## 2013-09-25 DIAGNOSIS — S8290XS Unspecified fracture of unspecified lower leg, sequela: Secondary | ICD-10-CM

## 2013-09-25 DIAGNOSIS — J309 Allergic rhinitis, unspecified: Secondary | ICD-10-CM

## 2013-09-25 DIAGNOSIS — E785 Hyperlipidemia, unspecified: Secondary | ICD-10-CM

## 2013-09-25 NOTE — Progress Notes (Signed)
Patient ID: [name] [name], female   DOB: 08-09-1931, 80 y.o.   MRN: [id number]              PROGRESS NOTE  DATE: 09/25/2013   FACILITY: [facility]  LEVEL OF CARE: SNF (31)  Acute Visit  CHIEF COMPLAINT:  Discharge Notes  HISTORY OF PRESENT ILLNESS: This is a 80 year old female who is for discharge home. She has been admitted to [facility] on 09/13/13 from [facility] with Bimalleolar ankle fracture S/P ORIF. Patient was admitted to this facility for short-term rehabilitation after the patient's recent hospitalization.  Patient has completed SNF rehabilitation and therapy has cleared the patient for discharge.  Reassessment of ongoing problem(s):  HTN: Pt 's HTN remains stable.  Denies CP, sob, DOE, pedal edema, headaches, dizziness or visual disturbances.  No complications from the medications currently being used.  Last BP : 120/62  HYPERLIPIDEMIA: No complications from the medications presently being used. 6/14 fasting lipid panel showed : Cholesterol 194 triglycerides 98 HDL 56 LDL 118  ALLERGIC RHINITIS: Allergic rhinitis remains stable.  Patient denies ongoing symptoms such as runny nose sneezing or tearing. No complications reported from the current medication(s) being used.  PAST MEDICAL HISTORY : Reviewed.  No changes/see problem list  CURRENT MEDICATIONS: Reviewed per MAR/see medication list  REVIEW OF SYSTEMS:  GENERAL: no change in appetite, no fatigue, no weight changes, no fever, chills or weakness RESPIRATORY: no cough, SOB, DOE, wheezing, hemoptysis CARDIAC: no chest pain, edema or palpitations GI: no abdominal pain, diarrhea, constipation, heart burn, nausea or vomiting  PHYSICAL EXAMINATION  GENERAL: no acute distress, normal body habitus EYES: conjunctivae normal, sclerae normal, normal eye lids NECK: supple, trachea midline, no neck masses, no thyroid tenderness, no thyromegaly LYMPHATICS: no LAN in the neck, no supraclavicular  LAN RESPIRATORY: breathing is even & unlabored, BS CTAB CARDIAC: RRR, no murmur,no extra heart sounds, no edema GI: abdomen soft, normal BS, no masses, no tenderness, no hepatomegaly, no splenomegaly EXTREMITIES:  RLE short leg cast; able to wiggle toes on right foot PSYCHIATRIC: the patient is alert & oriented to person, affect & behavior appropriate  LABS/RADIOLOGY: 09/15/13  sodium 136 potassium 3.6 glucose 107 BUN 13 creatinine 1.0 calcium 9.5 Labs reviewed: Basic Metabolic Panel:  Recent Labs  09/11/13 1456 09/12/13 0508 09/13/13 0534  NA 140 139 141  K 3.3* 3.5* 3.3*  CL 97 98 100  CO2 30 28 30   GLUCOSE 100* 154* 112*  BUN 19 15 18   CREATININE 1.04 0.97 1.01  CALCIUM 9.6 9.1 9.0   CBC:  Recent Labs  09/11/13 1456  WBC 9.8  HGB 12.4  HCT 37.3  MCV 89.0  PLT 243    Transthoracic Echocardiography  Patient:    [name], [name] MR #:       [id number] Study Date: 04/05/2013 Gender:     F Age:        41 Height:     154.9cm Weight:     60.8kg BSA:        1.40m^2 Pt. Status: Room:    ATTENDING    Delene Ruffini, Mark  REFERRING    Candee Furbish  SONOGRAPHER  Victorio Palm, RDCS  PERFORMING   [facility], Outpatient cc:  ------------------------------------------------------------ LV EF: 60% -   65%  ------------------------------------------------------------ Indications:      Murmur 785.2.  ------------------------------------------------------------ History:   PMH:  PVCs. Acquired from the patient and from the  patient's chart. 2/6 Systolic murmur.  Risk factors: Hypertension. Dyslipidemia.  ------------------------------------------------------------ Study Conclusions  - Left ventricle: The cavity size was normal. There was mild   concentric hypertrophy. Systolic function was normal. The   estimated ejection fraction was in the range of 60% to   65%. Wall motion was normal; there were no regional wall   motion abnormalities. Doppler  parameters are consistent   with abnormal left ventricular relaxation (grade 1   diastolic dysfunction). - Aortic valve: Mildly calcified leaflets. Peak and mean   gradients of 22 mmHg and 11 mmHG. Based on an LVOT   diameter of 2.1 cm, the calculated AVA is 1.5-1.6 cm2,   suggestive of mild aortic stenosis. Mean gradient: 62mm Hg   (S). Peak gradient: 44mm Hg (S). - Mitral valve: Calcified annulus. Trivial regurgitation. - Left atrium: Mildly dilated (31 ml/m2). - Right atrium: The atrium was normal in size. - Tricuspid valve: Mild regurgitation. - Inferior vena cava: The vessel was normal in size; the   respirophasic diameter changes were in the normal range (=   50%); findings are consistent with normal central venous   pressure. - Pericardium, extracardiac: There was no pericardial   effusion. Transthoracic echocardiography.  M-mode, complete 2D, spectral Doppler, and color Doppler.  Height:  Height: 154.9cm. Height: 61in.  Weight:  Weight: 60.8kg. Weight: 133.7lb.  Body mass index:  BMI: 25.3kg/m^2.  Body surface area:    BSA: 1.49m^2.  Blood pressure:     120/75.  Patient status:  Outpatient.  Location:   Site 3  ------------------------------------------------------------  ------------------------------------------------------------ Left ventricle:  The cavity size was normal. There was mild concentric hypertrophy. Systolic function was normal. The estimated ejection fraction was in the range of 60% to 65%. Wall motion was normal; there were no regional wall motion abnormalities. Doppler parameters are consistent with abnormal left ventricular relaxation (grade 1 diastolic dysfunction). The E/e' ratio is >10, suggesting elevated LV filling pressure.  ------------------------------------------------------------ Aortic valve:   Mildly calcified leaflets. Peak and mean gradients of 22 mmHg and 11 mmHG. Based on an LVOT diameter of 2.1 cm, the calculated AVA is  1.5-1.6 cm2, suggestive of mild aortic stenosis.  Doppler:     VTI ratio of LVOT to aortic valve: 0.44. Valve area: 1.53cm^2(VTI). Indexed valve area: 0.96cm^2/m^2 (VTI). Peak velocity ratio of LVOT to aortic valve: 0.44. Valve area: 1.52cm^2 (Vmax). Indexed valve area: 0.96cm^2/m^2 (Vmax).    Mean gradient: 53mm Hg (S). Peak gradient: 58mm Hg (S).  ------------------------------------------------------------ Aorta:  Aortic root: The aortic root was normal in size. Ascending aorta: The ascending aorta was normal in size.  ------------------------------------------------------------ Mitral valve:   Calcified annulus.  Doppler:   Trivial regurgitation.  ------------------------------------------------------------ Left atrium:  Mildly dilated (31 ml/m2).  ------------------------------------------------------------ Right ventricle:  The cavity size was normal. Wall thickness was normal. Systolic function was normal.  ------------------------------------------------------------ Pulmonic valve:    The valve appears to be grossly normal.  Doppler:   Trivial regurgitation.  ------------------------------------------------------------ Tricuspid valve:   Doppler:   Mild regurgitation.  ------------------------------------------------------------ Pulmonary artery:   The main pulmonary artery was normal-sized.  ------------------------------------------------------------ Right atrium:  The atrium was normal in size.  ------------------------------------------------------------ Pericardium:  There was no pericardial effusion.  ------------------------------------------------------------ Systemic veins: Inferior vena cava: The vessel was normal in size; the respirophasic diameter changes were in the normal range (= 50%); findings are consistent with normal central venous pressure.  ------------------------------------------------------------  2D measurements        Normal  Doppler  measurements   Normal Left ventricle                 Main pulmonary LVID ED,   26.2 mm     43-52   artery chord,                         Pressure,    26 mm Hg  =30 PLAX                           S LVID ES,   18.1 mm     23-38   Left ventricle chord,                         Ea, lat    6.36 cm/s   ------ PLAX                           ann, tiss FS, chord,   31 %      >29     DP PLAX                           E/Ea, lat  10.7        ------ LVPW, ED   10.2 mm     ------  ann, tiss     1 IVS/LVPW   1.26        <1.3    DP ratio, ED                      Ea, med    4.93 cm/s   ------ Ventricular septum             ann, tiss IVS, ED    12.9 mm     ------  DP LVOT                           E/Ea, med  13.8        ------ Diam, S      21 mm     ------  ann, tiss     1 Area       3.46 cm^2   ------  DP Diam         20 mm     ------  LVOT Aorta                          Peak vel,   102 cm/s   ------ Root diam,   28 mm     ------  S ED                             VTI, S       22 cm     ------ Left atrium                    Stroke vol 76.2 ml     ------ AP dim       32 mm     ------  Stroke     47.9 ml/m^2 ------ AP dim     2.01 cm/m^2 <2.2    index index  Aortic valve                                Peak vel,   232 cm/s   ------                                S                                Mean vel,   164 cm/s   ------                                S                                VTI, S     49.8 cm     ------                                Mean         12 mm Hg  ------                                gradient,                                S                                Peak         22 mm Hg  ------                                gradient,                                S                                VTI ratio  0.44        ------                                LVOT/AV                                Area, VTI  1.53 cm^2   ------                                Area  index 0.96 cm^2/m ------                                (VTI)           ^  2                                Peak vel   0.44        ------                                ratio,                                LVOT/AV                                Area, Vmax 1.52 cm^2   ------                                Area index 0.96 cm^2/m ------                                (Vmax)          ^2                                Mitral valve                                Peak E vel 68.1 cm/s   ------                                Peak A vel 95.3 cm/s   ------                                Decelerati  187 ms     150-23                                on time                0                                Peak E/A    0.7        ------                                ratio                                Tricuspid valve                                Regurg      238 cm/s   ------  peak vel                                Peak RV-RA   23 mm Hg  ------                                gradient,                                S                                Systemic veins                                Estimated     3 mm Hg  ------                                CVP                                Right ventricle                                Pressure,    26 mm Hg  <30                                S                                Sa vel,    12.7 cm/s   ------                                lat ann,                                tiss DP   RIGHT ANKLE - COMPLETE 3+ VIEW   COMPARISON:  None.   FINDINGS: No comparison studies are available. Bone detail is limited by a plaster splint. There is an oblique fracture of the distal fibula with associated posterolateral displacement. There is a laterally displaced fracture involving the medial malleolus. There is posterolateral subluxation of the talus with respect to the tibial plafond. No tarsal bone fractures are observed.    IMPRESSION: Right ankle fracture subluxation as described with persistent posterolateral subluxation of the talus status post closed reduction. Bone detail obscured by the splint. PORTABLE RIGHT ANKLE - 2 VIEW   COMPARISON:  Same day.   FINDINGS: The joint has been casted and immobilized. Successful reduction of the talar dislocation is noted which is significantly improved compared to prior exam.   IMPRESSION: Successful reduction of talar dislocation.   RIGHT ANKLE - 2 VIEW   COMPARISON:  In plaster views of the ankle of today's date.   FINDINGS: The patient has undergone ORIF for a displaced medial  malleolar fracture and a oblique fracture of the distal fibula. The metallic hardware is intact. Alignment is now anatomic.   IMPRESSION: The patient undergone successful ORIF of distal fibular and medial malleolar fractures.   ASSESSMENT/PLAN:  Bimalleolar right ankle fracture status post ORIF - S/P short-term rehabilitation Hypertension - well controlled; continue HCTZ and metoprolol Hyperlipidemia - continue Zetia Constipation - continue MiraLax Allergic rhinitis - stable; continue Zyrtec and Nasonex   I have filled out patient's discharge paperwork and written prescriptions.    Total discharge time: Less than 30 minutes  Discharge time involved coordination of the discharge process with Education officer, museum, nursing staff and therapy department. Medical justification for home health services verified.  CPT CODE: 29562  Seth Bake - NP [facility] [phone]

## 2013-11-05 ENCOUNTER — Ambulatory Visit
Admission: RE | Admit: 2013-11-05 | Discharge: 2013-11-05 | Disposition: A | Discharge status: Home / Self Care | Payer: Federal, State, Local not specified - PPO | Source: Ambulatory Visit | Attending: Family Medicine | Admitting: Family Medicine

## 2013-11-05 DIAGNOSIS — R42 Dizziness and giddiness: Secondary | ICD-10-CM

## 2013-11-05 DIAGNOSIS — E785 Hyperlipidemia, unspecified: Secondary | ICD-10-CM

## 2014-09-21 ENCOUNTER — Other Ambulatory Visit: Payer: Self-pay | Admitting: Family Medicine

## 2014-09-21 DIAGNOSIS — I6529 Occlusion and stenosis of unspecified carotid artery: Secondary | ICD-10-CM

## 2014-09-27 ENCOUNTER — Ambulatory Visit
Admission: RE | Admit: 2014-09-27 | Discharge: 2014-09-27 | Disposition: A | Discharge status: Home / Self Care | Payer: Federal, State, Local not specified - PPO | Source: Ambulatory Visit | Attending: Family Medicine | Admitting: Family Medicine

## 2014-09-27 DIAGNOSIS — I6529 Occlusion and stenosis of unspecified carotid artery: Secondary | ICD-10-CM

## 2014-10-14 ENCOUNTER — Telehealth: Payer: Self-pay | Admitting: Cardiology

## 2014-10-14 NOTE — Telephone Encounter (Signed)
New Prob    States recent abnormal EKG was faxed over. Calling to follow up and see about recommendations Dr. Marlou Porch has for possible surgery coming up. Please call.

## 2014-10-14 NOTE — Telephone Encounter (Signed)
[facility] with Dr. Siri Cole office. Informed her that no fax was seen at this time, and gave her another fax number to use. Informed her that Dr. Marlou Porch is not in the office today and will not be back until next week. Will await to see fax.

## 2014-10-18 NOTE — Telephone Encounter (Signed)
Per Dr Marlou Porch who reviewed EKG.  Poor R wave progression. No significant change from prior.  As long as she is having no symptoms, no need for further cardiac workup.  Paperwork taken back to MR to be faxed.

## 2014-10-18 NOTE — Telephone Encounter (Signed)
Surgical clearance request and EKG have been received and on Dr Marlou Porch cart to be reviewed.

## 2014-11-17 ENCOUNTER — Other Ambulatory Visit: Payer: Self-pay | Admitting: Gastroenterology

## 2014-11-17 DIAGNOSIS — R740 Nonspecific elevation of levels of transaminase and lactic acid dehydrogenase [LDH]: Principal | ICD-10-CM

## 2014-11-17 DIAGNOSIS — R7401 Elevation of levels of liver transaminase levels: Secondary | ICD-10-CM

## 2014-11-17 DIAGNOSIS — R7402 Elevation of levels of lactic acid dehydrogenase (LDH): Secondary | ICD-10-CM

## 2014-11-23 ENCOUNTER — Other Ambulatory Visit: Payer: Self-pay | Admitting: Gastroenterology

## 2014-11-23 ENCOUNTER — Ambulatory Visit
Admission: RE | Admit: 2014-11-23 | Discharge: 2014-11-23 | Disposition: A | Discharge status: Home / Self Care | Payer: Federal, State, Local not specified - PPO | Source: Ambulatory Visit | Attending: Gastroenterology | Admitting: Gastroenterology

## 2014-11-23 DIAGNOSIS — R740 Nonspecific elevation of levels of transaminase and lactic acid dehydrogenase [LDH]: Principal | ICD-10-CM

## 2014-11-23 DIAGNOSIS — R7402 Elevation of levels of lactic acid dehydrogenase (LDH): Secondary | ICD-10-CM

## 2014-11-23 DIAGNOSIS — R7401 Elevation of levels of liver transaminase levels: Secondary | ICD-10-CM

## 2015-09-18 ENCOUNTER — Ambulatory Visit
Admission: RE | Admit: 2015-09-18 | Discharge: 2015-09-18 | Disposition: A | Discharge status: Home / Self Care | Payer: Federal, State, Local not specified - PPO | Source: Ambulatory Visit | Attending: Family Medicine | Admitting: Family Medicine

## 2015-09-18 ENCOUNTER — Other Ambulatory Visit: Payer: Self-pay | Admitting: Family Medicine

## 2015-09-18 DIAGNOSIS — R05 Cough: Secondary | ICD-10-CM

## 2015-09-18 DIAGNOSIS — R059 Cough, unspecified: Secondary | ICD-10-CM

## 2015-11-03 ENCOUNTER — Institutional Professional Consult (permissible substitution): Payer: Federal, State, Local not specified - PPO | Admitting: Internal Medicine

## 2015-11-21 ENCOUNTER — Ambulatory Visit (INDEPENDENT_AMBULATORY_CARE_PROVIDER_SITE_OTHER): Payer: Federal, State, Local not specified - PPO | Admitting: Internal Medicine

## 2015-11-21 ENCOUNTER — Encounter: Payer: Self-pay | Admitting: Internal Medicine

## 2015-11-21 VITALS — BP 142/70 | HR 89 | Ht 60.25 in | Wt 136.2 lb

## 2015-11-21 DIAGNOSIS — R05 Cough: Secondary | ICD-10-CM

## 2015-11-21 DIAGNOSIS — R058 Other specified cough: Secondary | ICD-10-CM | POA: Insufficient documentation

## 2015-11-21 LAB — NITRIC OXIDE: Nitric Oxide: 22

## 2015-11-21 MED ORDER — PANTOPRAZOLE SODIUM 40 MG PO TBEC: 40.0000 mg | DELAYED_RELEASE_TABLET | Freq: Every day | ORAL | 2 refills | Status: DC

## 2015-11-21 MED ORDER — BENZONATATE 200 MG PO CAPS: 200.0000 mg | ORAL_CAPSULE | Freq: Three times a day (TID) | ORAL | 1 refills | Status: DC | PRN

## 2015-11-21 MED ORDER — TRAMADOL HCL 50 MG PO TABS: ORAL_TABLET | ORAL | 0 refills | Status: DC

## 2015-11-21 MED ORDER — PREDNISONE 10 MG PO TABS: ORAL_TABLET | ORAL | 0 refills | Status: DC

## 2015-11-21 NOTE — Patient Instructions (Addendum)
The key to effective treatment for your cough is eliminating the non-stop cycle of cough you're stuck in long enough to let your airway heal completely and then see if there is anything still making you cough once you stop the cough suppression, but this should take no more than 5 days to figure out  First take tessalon and or delsym two tsp every 12 hours and supplement if needed with  tramadol 50 mg up to 1  every 4 hours to suppress the urge to cough at all or even clear your throat. Swallowing water or using ice chips/non mint and menthol containing candies (such as lifesavers or sugarless jolly ranchers) are also effective.  You should rest your voice and avoid activities that you know make you cough.  Once you have eliminated the cough for 3 straight days try reducing the tramadol first,  then the delsym as tolerated.    Prednisone 10 mg take  4 each am x 2 days,   2 each am x 2 days,  1 each am x 2 days and stop (this is to eliminate allergies and inflammation from coughing)  Protonix (pantoprazole) Take 30-60 min before first meal of the day and continue zantac 150 mg at bedtime   GERD (REFLUX)  is an extremely common cause of respiratory symptoms, many times with no significant heartburn at all.    It can be treated with medication, but also with lifestyle changes including avoidance of late meals, excessive alcohol, smoking cessation, and avoid fatty foods, chocolate, peppermint, colas, red wine, and acidic juices such as orange juice.  NO MINT OR MENTHOL PRODUCTS SO NO COUGH DROPS   USE HARD CANDY INSTEAD (jolley ranchers or Stover's or Lifesavers (all available in sugarless versions) NO OIL BASED VITAMINS - use powdered substitutes.  Please see patient coordinator before you leave today  to schedule sinus CT and I will let you know whether you need to return to see Dr Janace Hoard   Return in 2 weeks if not convinced you are better

## 2015-11-21 NOTE — Progress Notes (Signed)
Subjective:     Patient ID: [name] [name], female   DOB: 1932-01-01,     MRN: [id number]  HPI   26 yowf never smokier with onset in teens of rhinitis/asthma never played vigorous sports > nasal surgeries in 26s in [place] no better with nasal polyps removed multiple times > [place] pulmonary eval > rec more sinus surgery 1971 very little improvement then moved to [place] and started seeing Janace Hoard 1990s > more sinus surgery and seemed better and no need for any meds but flonase and did fine until Feb 2017 with uri/ persistent cough since referred to pulmonary clinic 11/21/2015 by Dr   Chapman Fitch.   11/21/2015 1st [facility] office visit/ Baelyn Doring   Chief Complaint  Patient presents with  . Pulmonary Consult    Referred by Dr. Antony Blackbird. Pt c/o cough since Feb 2017. She states cough bothers her all day and night, sometimes wakes her up. Cough is non prod.   acute onset Feb 2017 with uri/ sore throat/ ear aches hacking dry cough and some better with tessalon  But not  prednisone better while sucking on candy but that's mostly peppermints   Not limited by breathing from desired activities   No obvious day to day or daytime variability or assoc excess/ purulent sputum or mucus plugs or hemoptysis or cp or chest tightness, subjective wheeze or overt sinus or hb symptoms. No unusual exp hx or h/o childhood pna/ asthma or knowledge of premature birth.  Sleeping ok without nocturnal  or early am exacerbation  of respiratory  c/o's or need for noct saba. Also denies any obvious fluctuation of symptoms with weather or environmental changes or other aggravating or alleviating factors except as outlined above   Current Medications, Allergies, Complete Past Medical History, Past Surgical History, Family History, and Social History were reviewed in [employer] record.  ROS  The following are not active complaints unless bolded sore throat, dysphagia, dental problems, itching,  sneezing,  nasal congestion or excess/ purulent secretions, ear ache,   fever, chills, sweats, unintended wt loss, classically pleuritic or exertional cp,  orthopnea pnd or leg swelling, presyncope, palpitations, abdominal pain, anorexia, nausea, vomiting, diarrhea  or change in bowel or bladder habits, change in stools or urine, dysuria,hematuria,  rash, arthralgias, visual complaints, headache, numbness, weakness or ataxia or problems with walking or coordination,  change in mood/affect or memory.            Review of Systems     Objective:   Physical Exam    amb wf nad    Wt Readings from Last 3 Encounters:  11/21/15 136 lb 3.2 oz (61.8 kg)  09/25/13 132 lb 12.8 oz (60.2 kg)  09/11/13 130 lb (59 kg)    Vital signs reviewed  HEENT: nl dentition, turbinates, and oropharynx. Nl external ear canals without cough reflex   NECK :  without JVD/Nodes/TM/ nl carotid upstrokes bilaterally   LUNGS: no acc muscle use,  Nl contour chest which is clear to A and P bilaterally without cough on insp or exp maneuvers   CV:  RRR  no s3 or murmur or increase in P2, no edema   ABD:  soft and nontender with nl inspiratory excursion in the supine position. No bruits or organomegaly, bowel sounds nl  MS:  Nl gait/ ext warm without deformities, calf tenderness, cyanosis or clubbing No obvious joint restrictions   SKIN: warm and dry without lesions    NEURO:  alert,  approp, nl sensorium with  no motor deficits      I personally reviewed images and agree with radiology impression as follows:  CXR:   09/18/15 The heart size and mediastinal contours are within normal limits. Both lungs are clear. No pneumothorax or pleural effusion is noted. Stable hiatal hernia is noted. The visualized skeletal structures are unremarkable. - my impression : HH is very large       Assessment:

## 2015-11-22 NOTE — Assessment & Plan Note (Signed)
FENO 11/21/2015  =  22   - sinus CT 11/21/2015 >>>    The most common causes of chronic cough in immunocompetent adults include the following: upper airway cough syndrome (UACS), previously referred to as postnasal drip syndrome (PNDS), which is caused by variety of rhinosinus conditions; (2) asthma ( less likely with FENO <25) ; (3) GERD; (4) chronic bronchitis from cigarette smoking or other inhaled environmental irritants; (5) nonasthmatic eosinophilic bronchitis(very unlikely with FENO so low); and (6) bronchiectasis.   These conditions, singly or in combination, have accounted for up to 94% of the causes of chronic cough in prospective studies.   Other conditions have constituted no >6% of the causes in prospective studies These have included bronchogenic carcinoma, chronic interstitial pneumonia, sarcoidosis, left ventricular failure, ACEI-induced cough, and aspiration from a condition associated with pharyngeal dysfunction.    Chronic cough is often simultaneously caused by more than one condition. A single cause has been found from 38 to 82% of the time, multiple causes from 18 to 62%. Multiply caused cough has been the result of three diseases up to 42% of the time.       Based on hx and exam, this is most likely:  Classic Upper airway cough syndrome, so named because it's frequently impossible to sort out how much is  CR/sinusitis with freq throat clearing (which can be related to primary GERD)   vs  causing  secondary (" extra esophageal")  GERD from wide swings in gastric pressure that occur with throat clearing, often  promoting self use of mint and menthol lozenges that reduce the lower esophageal sphincter tone and exacerbate the problem further in a cyclical fashion.   These are the same pts (now being labeled as having "irritable larynx syndrome" by some cough centers) who not infrequently have a history of having failed to tolerate ace inhibitors,  dry powder inhalers or  biphosphonates or report having atypical reflux symptoms that don't respond to standard doses of PPI , and are easily confused as having aecopd or asthma flares by even experienced allergists/ pulmonologists.   The first steps are to maximize  GERD rx (which includes getting rid of all mint products)  and eliminate cyclical coughing and due to h/o sinus dz and persistent assoc ear aches with a nl ext exam rec proceeding to Sinus Ct as well and refer back to Dr Janace Hoard if needed  Will see again in 2 weeks if not better  Total time devoted to counseling  = 35/54m review case with pt/ discussion of options/alternatives/ personally creating written instructions  in presence of pt  then going over those specific  Instructions directly with the pt including how to use all of the meds but in particular covering each new medication in detail and the difference between the maintenance/automatic meds and the prns using an action plan format for the latter.

## 2015-11-28 ENCOUNTER — Ambulatory Visit (INDEPENDENT_AMBULATORY_CARE_PROVIDER_SITE_OTHER)
Admission: RE | Admit: 2015-11-28 | Discharge: 2015-11-28 | Disposition: A | Discharge status: Home / Self Care | Payer: Federal, State, Local not specified - PPO | Source: Ambulatory Visit | Attending: Internal Medicine | Admitting: Internal Medicine

## 2015-11-28 DIAGNOSIS — R05 Cough: Secondary | ICD-10-CM | POA: Diagnosis not present

## 2015-11-28 DIAGNOSIS — R058 Other specified cough: Secondary | ICD-10-CM

## 2015-11-29 NOTE — Progress Notes (Signed)
LMTCB

## 2015-12-04 NOTE — Progress Notes (Signed)
LMTCB

## 2015-12-05 ENCOUNTER — Telehealth: Payer: Self-pay | Admitting: Internal Medicine

## 2015-12-05 ENCOUNTER — Other Ambulatory Visit: Payer: Self-pay

## 2015-12-05 DIAGNOSIS — J329 Chronic sinusitis, unspecified: Secondary | ICD-10-CM

## 2015-12-05 NOTE — Telephone Encounter (Signed)
Patient came in the office regarding results - she would like [place] to call her on her cell phone [phone]

## 2015-12-05 NOTE — Progress Notes (Signed)
Spoke with Pt. And informed her about results per Dr. Melvyn Novas. The referral was placed. Nothing further needed at this moment.

## 2015-12-05 NOTE — Telephone Encounter (Signed)
Spoke with Pt. And informed her about results per Dr. Melvyn Novas. The referral was placed. Nothing further needed at this moment.

## 2015-12-08 ENCOUNTER — Ambulatory Visit: Payer: Federal, State, Local not specified - PPO | Admitting: Internal Medicine

## 2015-12-20 DIAGNOSIS — J329 Chronic sinusitis, unspecified: Secondary | ICD-10-CM | POA: Insufficient documentation

## 2015-12-20 DIAGNOSIS — J320 Chronic maxillary sinusitis: Secondary | ICD-10-CM | POA: Insufficient documentation

## 2016-03-30 ENCOUNTER — Encounter (HOSPITAL_COMMUNITY): Payer: Self-pay | Admitting: Emergency Medicine

## 2016-03-30 ENCOUNTER — Emergency Department (HOSPITAL_COMMUNITY): Payer: Federal, State, Local not specified - PPO

## 2016-03-30 ENCOUNTER — Emergency Department (HOSPITAL_COMMUNITY)
Admission: EM | Admit: 2016-03-30 | Discharge: 2016-03-30 | Disposition: A | Discharge status: Home / Self Care | Payer: Federal, State, Local not specified - PPO | Attending: Emergency Medicine | Admitting: Emergency Medicine

## 2016-03-30 DIAGNOSIS — Z8673 Personal history of transient ischemic attack (TIA), and cerebral infarction without residual deficits: Secondary | ICD-10-CM | POA: Insufficient documentation

## 2016-03-30 DIAGNOSIS — J101 Influenza due to other identified influenza virus with other respiratory manifestations: Secondary | ICD-10-CM

## 2016-03-30 DIAGNOSIS — R05 Cough: Secondary | ICD-10-CM | POA: Diagnosis present

## 2016-03-30 DIAGNOSIS — Z79899 Other long term (current) drug therapy: Secondary | ICD-10-CM | POA: Diagnosis not present

## 2016-03-30 DIAGNOSIS — I10 Essential (primary) hypertension: Secondary | ICD-10-CM | POA: Diagnosis not present

## 2016-03-30 LAB — COMPREHENSIVE METABOLIC PANEL
ALBUMIN: 4 g/dL (ref 3.5–5.0)
ALT: 64 U/L — AB (ref 14–54)
AST: 74 U/L — AB (ref 15–41)
Alkaline Phosphatase: 206 U/L — ABNORMAL HIGH (ref 38–126)
Anion gap: 12 (ref 5–15)
BILIRUBIN TOTAL: 0.5 mg/dL (ref 0.3–1.2)
BUN: 13 mg/dL (ref 6–20)
CALCIUM: 9.2 mg/dL (ref 8.9–10.3)
CO2: 27 mmol/L (ref 22–32)
CREATININE: 1.24 mg/dL — AB (ref 0.44–1.00)
Chloride: 96 mmol/L — ABNORMAL LOW (ref 101–111)
GFR calc Af Amer: 46 mL/min — ABNORMAL LOW (ref >60)
GFR calc non Af Amer: 39 mL/min — ABNORMAL LOW (ref >60)
GLUCOSE: 106 mg/dL — AB (ref 65–99)
Potassium: 3.1 mmol/L — ABNORMAL LOW (ref 3.5–5.1)
SODIUM: 135 mmol/L (ref 135–145)
Total Protein: 7.3 g/dL (ref 6.5–8.1)

## 2016-03-30 LAB — URINALYSIS, ROUTINE W REFLEX MICROSCOPIC
BILIRUBIN URINE: NEGATIVE
GLUCOSE, UA: NEGATIVE mg/dL
Hgb urine dipstick: NEGATIVE
KETONES UR: 5 mg/dL — AB
Leukocytes, UA: NEGATIVE
NITRITE: NEGATIVE
PH: 7 (ref 5.0–8.0)
Protein, ur: NEGATIVE mg/dL
Specific Gravity, Urine: 1.013 (ref 1.005–1.030)

## 2016-03-30 LAB — CBC WITH DIFFERENTIAL/PLATELET
Basophils Absolute: 0 10*3/uL (ref 0.0–0.1)
Basophils Relative: 0 %
EOS PCT: 2 %
Eosinophils Absolute: 0.2 10*3/uL (ref 0.0–0.7)
HEMATOCRIT: 37.4 % (ref 36.0–46.0)
Hemoglobin: 12.5 g/dL (ref 12.0–15.0)
Lymphocytes Relative: 14 %
Lymphs Abs: 1.2 10*3/uL (ref 0.7–4.0)
MCH: 30.3 pg (ref 26.0–34.0)
MCHC: 33.4 g/dL (ref 30.0–36.0)
MCV: 90.6 fL (ref 78.0–100.0)
MONO ABS: 0.7 10*3/uL (ref 0.1–1.0)
MONOS PCT: 9 %
NEUTROS ABS: 6.3 10*3/uL (ref 1.7–7.7)
Neutrophils Relative %: 75 %
PLATELETS: 276 10*3/uL (ref 150–400)
RBC: 4.13 MIL/uL (ref 3.87–5.11)
RDW: 13.9 % (ref 11.5–15.5)
WBC: 8.3 10*3/uL (ref 4.0–10.5)

## 2016-03-30 LAB — I-STAT CG4 LACTIC ACID, ED
Lactic Acid, Venous: 1.6 mmol/L (ref 0.5–1.9)
Lactic Acid, Venous: 0.79 mmol/L (ref 0.5–1.9)

## 2016-03-30 LAB — INFLUENZA PANEL BY PCR (TYPE A & B)
INFLBPCR: POSITIVE — AB
Influenza A By PCR: NEGATIVE

## 2016-03-30 MED ORDER — OSELTAMIVIR PHOSPHATE 30 MG PO CAPS
30.0000 mg | ORAL_CAPSULE | Freq: Once | ORAL | Status: AC
  Administered 2016-03-30: 30 mg via ORAL
  Filled 2016-03-30: qty 1

## 2016-03-30 MED ORDER — SODIUM CHLORIDE 0.9 % IV BOLUS (SEPSIS)
500.0000 mL | Freq: Once | INTRAVENOUS | Status: AC
  Administered 2016-03-30: 500 mL via INTRAVENOUS

## 2016-03-30 MED ORDER — ACETAMINOPHEN 325 MG PO TABS
ORAL_TABLET | ORAL | Status: AC
  Administered 2016-03-30: 650 mg
  Filled 2016-03-30: qty 1

## 2016-03-30 MED ORDER — HYDROCODONE-ACETAMINOPHEN 7.5-325 MG/15ML PO SOLN
5.0000 mL | Freq: Once | ORAL | Status: AC
  Administered 2016-03-30: 5 mL via ORAL
  Filled 2016-03-30: qty 15

## 2016-03-30 MED ORDER — BENZONATATE 100 MG PO CAPS: 100.0000 mg | ORAL_CAPSULE | Freq: Three times a day (TID) | ORAL | 0 refills | Status: DC

## 2016-03-30 MED ORDER — SODIUM CHLORIDE 0.9 % IV SOLN
Freq: Once | INTRAVENOUS | Status: AC
  Administered 2016-03-30: 20:00:00 via INTRAVENOUS

## 2016-03-30 MED ORDER — BENZONATATE 100 MG PO CAPS
100.0000 mg | ORAL_CAPSULE | Freq: Once | ORAL | Status: AC
  Administered 2016-03-30: 100 mg via ORAL
  Filled 2016-03-30: qty 1

## 2016-03-30 MED ORDER — OSELTAMIVIR PHOSPHATE 30 MG PO CAPS: 30.0000 mg | ORAL_CAPSULE | Freq: Two times a day (BID) | ORAL | 0 refills | Status: DC

## 2016-03-30 NOTE — ED Provider Notes (Signed)
[facility] DEPT Provider Note   CSN: [id number] Arrival date & time: 03/30/16  1712     History   Chief Complaint Chief Complaint  Patient presents with  . Cough  . Fever    HPI [name] [name] is a 83 y.o. female. Chief complaint is cough and fever.  HPI: 83 year old female. Was in her normal state of health until this morning. She take Schmit had a cough during the night but this morning had cough and fever. Has had chills during the day and presents here for evaluation. No chest pain. No shortness of breath. No seeding URI symptoms. No GU or GI complaints. Was immunized for influenza.  Past Medical History:  Diagnosis Date  . Arthritis   . Hyperlipidemia   . Hypertension   . Lipoma    left arm  . Nasal polyps     Patient Active Problem List   Diagnosis Date Noted  . Upper airway cough syndrome 11/21/2015  . Hyperlipidemia 09/25/2013  . Allergic rhinitis, cause unspecified 09/15/2013  . Unspecified constipation 09/15/2013  . Fracture dislocation of ankle joint 09/11/2013  . Bimalleolar ankle fracture 09/11/2013  . TIA (transient ischemic attack) 06/24/2012  . HTN (hypertension) 06/24/2012  . Radicular pain in left arm 03/12/2011    Past Surgical History:  Procedure Laterality Date  . CHOLECYSTECTOMY  02/27/04  . INCONTINENCE SURGERY  09/12/09  . ORIF ANKLE FRACTURE Right 09/11/2013   Procedure: OPEN REDUCTION INTERNAL FIXATION (ORIF) ANKLE FRACTURE;  Surgeon: Augustin Schooling, MD;  Location: [facility];  Service: Orthopedics;  Laterality: Right;  . ROTATOR CUFF REPAIR  09/27/2003  . TRIGGER FINGER RELEASE  10/29/06    OB History    No data available       Home Medications    Prior to Admission medications   Medication Sig Start Date End Date Taking? Authorizing Provider  Calcium Carb-Cholecalciferol (CALCIUM 1000 + D PO) Take 1 tablet by mouth daily.   Yes Historical Provider, MD  cetirizine (ZYRTEC) 10 MG tablet Take 10 mg by mouth daily.   Yes Historical  Provider, MD  diclofenac sodium (VOLTAREN) 1 % GEL Apply 1 application topically 4 (four) times daily as needed (back pain).   Yes Historical Provider, MD  ezetimibe (ZETIA) 10 MG tablet Take 10 mg by mouth at bedtime.    Yes Historical Provider, MD  hydrochlorothiazide (HYDRODIURIL) 25 MG tablet Take 25 mg by mouth daily.   Yes Historical Provider, MD  ibuprofen (ADVIL,MOTRIN) 200 MG tablet Take 400 mg by mouth every 6 (six) hours as needed for moderate pain.   Yes Historical Provider, MD  meclizine (ANTIVERT) 25 MG tablet Take 25 mg by mouth 3 (three) times daily as needed for dizziness.   Yes Historical Provider, MD  metoprolol succinate (TOPROL-XL) 25 MG 24 hr tablet Take 25 mg by mouth daily.  02/28/13  Yes Historical Provider, MD  mometasone (NASONEX) 50 MCG/ACT nasal spray Place 2 sprays into the nose daily.   Yes Historical Provider, MD  Multiple Vitamin (MULTIVITAMIN WITH MINERALS) TABS tablet Take 1 tablet by mouth daily.   Yes Historical Provider, MD  polyvinyl alcohol (ARTIFICIAL TEARS) 1.4 % ophthalmic solution Place 1 drop into both eyes daily as needed for dry eyes.   Yes Historical Provider, MD  ranitidine (ZANTAC) 150 MG tablet Take 150 mg by mouth at bedtime.    Yes Historical Provider, MD  benzonatate (TESSALON) 100 MG capsule Take 1 capsule (100 mg total) by mouth every 8 (eight)  hours. 03/30/16   Tanna Furry, MD  clopidogrel (PLAVIX) 75 MG tablet Take 1 tablet (75 mg total) by mouth daily with breakfast. Patient not taking: Reported on 03/30/2016 06/25/12   Erline Hau, MD  oseltamivir (TAMIFLU) 30 MG capsule Take 1 capsule (30 mg total) by mouth 2 (two) times daily. 03/30/16   Tanna Furry, MD  pantoprazole (PROTONIX) 40 MG tablet Take 1 tablet (40 mg total) by mouth daily. Take 30-60 min before first meal of the day Patient not taking: Reported on 03/30/2016 11/21/15   Tanda Rockers, MD  traMADol Veatrice Bourbon) 50 MG tablet 1 up to every  4 hours as needed for cough or  pain Patient not taking: Reported on 03/30/2016 11/21/15   Tanda Rockers, MD    Family History Family History  Problem Relation Age of Onset  . Tuberculosis Mother   . Other Father     Bronchiectasis  . Cancer Maternal Grandmother     ovarian  . Sarcoidosis Sister   . Asthma Maternal Grandfather     Social History Social History  Substance Use Topics  . Smoking status: Never Smoker  . Smokeless tobacco: Never Used  . Alcohol use No     Allergies   Aspirin; Ceclor [cefaclor]; Statins; Penicillins; and Sulfa antibiotics   Review of Systems Review of Systems  Constitutional: Positive for fatigue and fever. Negative for appetite change, chills and diaphoresis.  HENT: Negative for mouth sores, sore throat and trouble swallowing.   Eyes: Negative for visual disturbance.  Respiratory: Positive for cough. Negative for chest tightness, shortness of breath and wheezing.   Cardiovascular: Negative for chest pain.  Gastrointestinal: Negative for abdominal distention, abdominal pain, diarrhea, nausea and vomiting.  Endocrine: Negative for polydipsia, polyphagia and polyuria.  Genitourinary: Negative for dysuria, frequency and hematuria.  Musculoskeletal: Negative for gait problem.  Skin: Negative for color change, pallor and rash.  Neurological: Negative for dizziness, syncope, light-headedness and headaches.  Hematological: Does not bruise/bleed easily.  Psychiatric/Behavioral: Negative for behavioral problems and confusion.     Physical Exam Updated Vital Signs BP 116/69 (BP Location: Right Arm)   Pulse 76   Temp 99.4 F (37.4 C) (Oral)   Resp 20   Ht 5\' 1"  (1.549 m)   Wt 135 lb (61.2 kg)   SpO2 99%   BMI 25.51 kg/m   Physical Exam  Constitutional: She is oriented to person, place, and time. She appears well-developed and well-nourished. No distress.  Patient appears well. Does not appear septic toxic. Awake and alert and mentating well. No increased work of  breathing.  HENT:  Head: Normocephalic.  Eyes: Conjunctivae are normal. Pupils are equal, round, and reactive to light. No scleral icterus.  Neck: Normal range of motion. Neck supple. No thyromegaly present.  Cardiovascular: Normal rate and regular rhythm.  Exam reveals no gallop and no friction rub.   No murmur heard. Pulmonary/Chest: Effort normal and breath sounds normal. No respiratory distress. She has no wheezes. She has no rales.  No wheezing rales or rhonchi. Saturating 95% on room air.  Abdominal: Soft. Bowel sounds are normal. She exhibits no distension. There is no tenderness. There is no rebound.  Musculoskeletal: Normal range of motion.  Neurological: She is alert and oriented to person, place, and time.  Skin: Skin is warm and dry. No rash noted.  Psychiatric: She has a normal mood and affect. Her behavior is normal.     ED Treatments / Results  Labs (all labs  ordered are listed, but only abnormal results are displayed) Labs Reviewed  COMPREHENSIVE METABOLIC PANEL - Abnormal; Notable for the following:       Result Value   Potassium 3.1 (*)    Chloride 96 (*)    Glucose, Bld 106 (*)    Creatinine, Ser 1.24 (*)    AST 74 (*)    ALT 64 (*)    Alkaline Phosphatase 206 (*)    GFR calc non Af Amer 39 (*)    GFR calc Af Amer 46 (*)    All other components within normal limits  URINALYSIS, ROUTINE W REFLEX MICROSCOPIC - Abnormal; Notable for the following:    Ketones, ur 5 (*)    All other components within normal limits  INFLUENZA PANEL BY PCR (TYPE A & B) - Abnormal; Notable for the following:    Influenza B By PCR POSITIVE (*)    All other components within normal limits  CBC WITH DIFFERENTIAL/PLATELET  I-STAT CG4 LACTIC ACID, ED  I-STAT CG4 LACTIC ACID, ED    EKG  EKG Interpretation None       Radiology Dg Chest 2 View  Result Date: 03/30/2016 CLINICAL DATA:  Fever and cough. EXAM: CHEST  2 VIEW COMPARISON:  09/18/2015 and prior radiographs FINDINGS: A  large hiatal hernia again noted. Cardiomediastinal silhouette is unchanged. There is no evidence of focal airspace disease, pulmonary edema, suspicious pulmonary nodule/mass, pleural effusion, or pneumothorax. No acute bony abnormalities are identified. A lower thoracic compression fracture is unchanged. IMPRESSION: No evidence of acute cardiopulmonary disease. Large hiatal hernia. Electronically Signed   By: Margarette Canada M.D.   On: 03/30/2016 18:36    Procedures Procedures (including critical care time)  Medications Ordered in ED Medications  acetaminophen (TYLENOL) 325 MG tablet (650 mg  Given 03/30/16 1746)  sodium chloride 0.9 % bolus 500 mL (0 mLs Intravenous Stopped 03/30/16 2045)  0.9 %  sodium chloride infusion ( Intravenous Stopped 03/30/16 2222)  benzonatate (TESSALON) capsule 100 mg (100 mg Oral Given 03/30/16 2007)  HYDROcodone-acetaminophen (HYCET) 7.5-325 mg/15 ml solution 5 mL (5 mLs Oral Given 03/30/16 2008)  oseltamivir (TAMIFLU) capsule 30 mg (30 mg Oral Given 03/30/16 2007)     Initial Impression / Assessment and Plan / ED Course  I have reviewed the triage vital signs and the nursing notes.  Pertinent labs & imaging results that were available during my care of the patient were reviewed by me and considered in my medical decision making (see chart for details).     Patient endocrine department without hypoxemia. No pneumonia. Normal lactate. Given Tamiflu here. Plan will be discharge, Tamiflu, Tessalon. I discussed return precautions with patient.  Final Clinical Impressions(s) / ED Diagnoses   Final diagnoses:  Influenza B    New Prescriptions Discharge Medication List as of 03/30/2016  9:46 PM    START taking these medications   Details  oseltamivir (TAMIFLU) 30 MG capsule Take 1 capsule (30 mg total) by mouth 2 (two) times daily., Starting Sat 03/30/2016, Print         Tanna Furry, MD 03/30/16 2350

## 2016-03-30 NOTE — Discharge Instructions (Signed)
Return to ER with any new or worsening symptoms.

## 2016-03-30 NOTE — ED Triage Notes (Signed)
Pt. Stated, I started running a fever last night with a terrible cough.

## 2016-03-30 NOTE — ED Notes (Signed)
Pt departed in NAD.  

## 2016-03-30 NOTE — ED Notes (Signed)
ED Provider at bedside. 

## 2017-05-12 DIAGNOSIS — M47816 Spondylosis without myelopathy or radiculopathy, lumbar region: Secondary | ICD-10-CM | POA: Insufficient documentation

## 2017-05-29 DIAGNOSIS — M79606 Pain in leg, unspecified: Secondary | ICD-10-CM | POA: Insufficient documentation

## 2017-06-03 DIAGNOSIS — M5416 Radiculopathy, lumbar region: Secondary | ICD-10-CM | POA: Insufficient documentation

## 2017-06-12 DIAGNOSIS — M25561 Pain in right knee: Secondary | ICD-10-CM | POA: Insufficient documentation

## 2017-07-28 DIAGNOSIS — M179 Osteoarthritis of knee, unspecified: Secondary | ICD-10-CM | POA: Insufficient documentation

## 2017-07-28 DIAGNOSIS — M1710 Unilateral primary osteoarthritis, unspecified knee: Secondary | ICD-10-CM | POA: Insufficient documentation

## 2017-10-09 ENCOUNTER — Other Ambulatory Visit: Payer: Self-pay | Admitting: Family Medicine

## 2017-10-09 DIAGNOSIS — R945 Abnormal results of liver function studies: Principal | ICD-10-CM

## 2017-10-09 DIAGNOSIS — R7989 Other specified abnormal findings of blood chemistry: Secondary | ICD-10-CM

## 2017-10-13 ENCOUNTER — Ambulatory Visit
Admission: RE | Admit: 2017-10-13 | Discharge: 2017-10-13 | Disposition: A | Discharge status: Home / Self Care | Payer: Federal, State, Local not specified - PPO | Source: Ambulatory Visit | Attending: Family Medicine | Admitting: Family Medicine

## 2017-10-13 DIAGNOSIS — R7989 Other specified abnormal findings of blood chemistry: Secondary | ICD-10-CM

## 2017-10-13 DIAGNOSIS — R945 Abnormal results of liver function studies: Principal | ICD-10-CM

## 2017-11-05 ENCOUNTER — Ambulatory Visit
Admission: RE | Admit: 2017-11-05 | Discharge: 2017-11-05 | Disposition: A | Discharge status: Home / Self Care | Payer: Federal, State, Local not specified - PPO | Source: Ambulatory Visit | Attending: Physician Assistant | Admitting: Physician Assistant

## 2017-11-05 ENCOUNTER — Other Ambulatory Visit: Payer: Self-pay | Admitting: Physician Assistant

## 2017-11-05 DIAGNOSIS — R194 Change in bowel habit: Secondary | ICD-10-CM

## 2018-03-31 DIAGNOSIS — J339 Nasal polyp, unspecified: Secondary | ICD-10-CM | POA: Insufficient documentation

## 2018-05-19 ENCOUNTER — Other Ambulatory Visit: Payer: Self-pay | Admitting: Cardiology

## 2018-05-19 DIAGNOSIS — R0989 Other specified symptoms and signs involving the circulatory and respiratory systems: Secondary | ICD-10-CM

## 2018-06-08 ENCOUNTER — Other Ambulatory Visit: Payer: Self-pay

## 2018-06-08 MED ORDER — CLOPIDOGREL BISULFATE 75 MG PO TABS: 75.0000 mg | ORAL_TABLET | ORAL | 1 refills | Status: DC

## 2018-07-21 ENCOUNTER — Ambulatory Visit: Payer: Federal, State, Local not specified - PPO | Admitting: Podiatry

## 2018-07-21 ENCOUNTER — Encounter: Payer: Self-pay | Admitting: Podiatry

## 2018-07-21 ENCOUNTER — Other Ambulatory Visit: Payer: Self-pay

## 2018-07-21 VITALS — BP 142/81 | HR 68 | Temp 97.7°F | Resp 16

## 2018-07-21 DIAGNOSIS — B351 Tinea unguium: Secondary | ICD-10-CM | POA: Diagnosis not present

## 2018-07-21 DIAGNOSIS — M79674 Pain in right toe(s): Secondary | ICD-10-CM | POA: Diagnosis not present

## 2018-07-21 DIAGNOSIS — M79675 Pain in left toe(s): Secondary | ICD-10-CM

## 2018-07-21 DIAGNOSIS — Z7901 Long term (current) use of anticoagulants: Secondary | ICD-10-CM

## 2018-07-21 NOTE — Progress Notes (Signed)
Subjective:    Patient ID: [name] [name], adult    DOB: 1931/04/28, 85 y.o.   MRN: [id number]  HPI 85 year old presents the office today for concerns of toenail issues.  She states that she has noticed her left big toenails becoming dark in color.  She states that she thought there was blood so she tried to cut it to release the blood but nothing came out.  She states that both of her nails are somewhat loose.  All of her nails are from become discolored as well and cause discomfort.  She has no other complaints.  No recent treatment.   Review of Systems  All other systems reviewed and are negative.  Past Medical History:  Diagnosis Date  . Arthritis   . Hyperlipidemia   . Hypertension   . Lipoma    left arm  . Nasal polyps     Past Surgical History:  Procedure Laterality Date  . CHOLECYSTECTOMY  02/27/04  . INCONTINENCE SURGERY  09/12/09  . ORIF ANKLE FRACTURE Right 09/11/2013   Procedure: OPEN REDUCTION INTERNAL FIXATION (ORIF) ANKLE FRACTURE;  Surgeon: Augustin Schooling, MD;  Location: [facility];  Service: Orthopedics;  Laterality: Right;  . ROTATOR CUFF REPAIR  09/27/2003  . TRIGGER FINGER RELEASE  10/29/06     Current Outpatient Medications:  .  alendronate (FOSAMAX) 70 MG tablet, , Disp: , Rfl:  .  Calcium Carb-Cholecalciferol (CALCIUM 1000 + D PO), Take 1 tablet by mouth daily., Disp: , Rfl:  .  cetirizine (ZYRTEC) 10 MG tablet, Take 10 mg by mouth daily., Disp: , Rfl:  .  clopidogrel (PLAVIX) 75 MG tablet, Take 1 tablet (75 mg total) by mouth every other day., Disp: 90 tablet, Rfl: 1 .  ezetimibe (ZETIA) 10 MG tablet, Take 10 mg by mouth at bedtime. , Disp: , Rfl:  .  meclizine (ANTIVERT) 25 MG tablet, Take 25 mg by mouth 3 (three) times daily as needed for dizziness., Disp: , Rfl:  .  metoprolol succinate (TOPROL-XL) 50 MG 24 hr tablet, Take 50 mg by mouth daily., Disp: , Rfl:  .  mometasone (NASONEX) 50 MCG/ACT nasal spray, Place 2 sprays into the nose daily., Disp: , Rfl:  .   Multiple Vitamin (MULTIVITAMIN WITH MINERALS) TABS tablet, Take 1 tablet by mouth daily., Disp: , Rfl:   Allergies  Allergen Reactions  . Aspirin Anaphylaxis  . Ceclor [Cefaclor] Anaphylaxis  . Statins Other (See Comments)    Elevated liver enzymes  . Penicillins Rash    Has patient had a PCN reaction causing immediate rash, facial/tongue/throat swelling, SOB or lightheadedness with hypotension: Yes Has patient had a PCN reaction causing severe rash involving mucus membranes or skin necrosis: No Has patient had a PCN reaction that required hospitalization No Has patient had a PCN reaction occurring within the last 10 years: No If all of the above answers are "NO", then may proceed with Cephalosporin use.  . Sulfa Antibiotics Rash        Objective:   Physical Exam General: AAO x3, NAD  Dermatological: The nails are mildly hypertrophic, dystrophic with yellow-brown discoloration.  The left hallux toenail is loose from the underlying nail bed as well as the right side.  On the left side there is dried blood present in the nail.  There is no extension of any hyperpigmentation into the surrounding skin.  No open lesions.  Vascular: Dorsalis Pedis artery and Posterior Tibial artery pedal pulses are 2/4 bilateral with immedate capillary  fill time. There is no pain with calf compression, swelling, warmth, erythema.   Neruologic: Grossly intact via light touch bilateral. Vibratory intact via tuning fork bilateral. Protective threshold with Semmes Wienstein monofilament intact to all pedal sites bilateral.    Musculoskeletal: No gross boney pedal deformities bilateral. No pain, crepitus, or limitation noted with foot and ankle range of motion bilateral. Muscular strength 5/5 in all groups tested bilateral.  Gait: Unassisted, Nonantalgic.      Assessment & Plan:  85 year old with onychomycosis, subungual hematoma -Treatment options discussed including all alternatives, risks, and  complications -Etiology of symptoms were discussed -I debrided the nails today without any complications or bleeding.  Sent this for culture to [facility] labs. Specimen was given to Cranford Mon, [place].  Discussed likely blood on the right hallux toenail which is dry.  Hopefully will grow out over time.  Monitor for any signs infection or ingrowing of the nail. Epsom salt soaks  Trula Slade DPM

## 2018-07-21 NOTE — Patient Instructions (Signed)

## 2018-07-23 NOTE — Addendum Note (Signed)
Addended by: Cranford Mon R on: 07/23/2018 08:19 AM   Modules accepted: Orders

## 2018-08-14 NOTE — Progress Notes (Signed)
Called and spoke with the patient and relayed the message per Dr Jacqualyn Posey and stated to call the office if any concerns or questions. Lattie Haw

## 2018-08-23 ENCOUNTER — Ambulatory Visit (HOSPITAL_COMMUNITY)
Admission: EM | Admit: 2018-08-23 | Discharge: 2018-08-23 | Disposition: A | Discharge status: Home / Self Care | Payer: Federal, State, Local not specified - PPO | Attending: Family Medicine | Admitting: Family Medicine

## 2018-08-23 ENCOUNTER — Other Ambulatory Visit: Payer: Self-pay

## 2018-08-23 ENCOUNTER — Ambulatory Visit (INDEPENDENT_AMBULATORY_CARE_PROVIDER_SITE_OTHER): Payer: Federal, State, Local not specified - PPO

## 2018-08-23 DIAGNOSIS — R5381 Other malaise: Secondary | ICD-10-CM

## 2018-08-23 DIAGNOSIS — H9202 Otalgia, left ear: Secondary | ICD-10-CM | POA: Diagnosis not present

## 2018-08-23 DIAGNOSIS — R350 Frequency of micturition: Secondary | ICD-10-CM

## 2018-08-23 DIAGNOSIS — R05 Cough: Secondary | ICD-10-CM | POA: Diagnosis present

## 2018-08-23 DIAGNOSIS — Z20828 Contact with and (suspected) exposure to other viral communicable diseases: Secondary | ICD-10-CM | POA: Diagnosis not present

## 2018-08-23 DIAGNOSIS — R509 Fever, unspecified: Secondary | ICD-10-CM

## 2018-08-23 DIAGNOSIS — R6884 Jaw pain: Secondary | ICD-10-CM

## 2018-08-23 DIAGNOSIS — R059 Cough, unspecified: Secondary | ICD-10-CM

## 2018-08-23 LAB — POCT URINALYSIS DIP (DEVICE)
Bilirubin Urine: NEGATIVE
Glucose, UA: NEGATIVE mg/dL
Hgb urine dipstick: NEGATIVE
Ketones, ur: NEGATIVE mg/dL
Nitrite: NEGATIVE
Protein, ur: NEGATIVE mg/dL
Specific Gravity, Urine: 1.02 (ref 1.005–1.030)
Urobilinogen, UA: 0.2 mg/dL (ref 0.0–1.0)
pH: 6.5 (ref 5.0–8.0)

## 2018-08-23 MED ORDER — BENZONATATE 100 MG PO CAPS: 100.0000 mg | ORAL_CAPSULE | Freq: Three times a day (TID) | ORAL | 0 refills | Status: DC | PRN

## 2018-08-23 NOTE — Discharge Instructions (Addendum)
We will manage this as a viral syndrome. For sore throat or cough try using a honey-based tea. Use 3 teaspoons of honey with juice squeezed from half lemon. Place shaved pieces of ginger into 1/2-1 cup of water and warm over stove top. Then mix the ingredients and repeat every 4 hours as needed. Please take Tylenol 500mg  every 6 hours. Hydrate very well with at least 2 liters of water. Eat light meals such as soups to replenish electrolytes and soft fruits, veggies. Maintain your cetirizine for your congestion.  Please make sure you restart your steroid nasal spray.  I highly recommend that you reschedule a visit with your ENT physician for recheck on possible TMJ dysfunction and recheck on your chronic allergic rhinitis.

## 2018-08-23 NOTE — ED Provider Notes (Signed)
MRN: [id number] DOB: 04-03-1931  Subjective:   ADALENE GULOTTA is a 85 y.o. adult presenting for 1 to 2-week history of mild cough, malaise and fatigue.  Patient reports that she started to have fever as well, left ear pain and would like to be checked for an UTI.  She presents with her daughter and husband with concern for possible COVID-19 in 1 to make sure that they do not have the virus.  Patient is very concerned because she is responsible for her husband's care and also has a difficult time dealing with her daughter who has substance abuse issues currently.   No current facility-administered medications for this encounter.   Current Outpatient Medications:  .  alendronate (FOSAMAX) 70 MG tablet, , Disp: , Rfl:  .  Calcium Carb-Cholecalciferol (CALCIUM 1000 + D PO), Take 1 tablet by mouth daily., Disp: , Rfl:  .  cetirizine (ZYRTEC) 10 MG tablet, Take 10 mg by mouth daily., Disp: , Rfl:  .  clopidogrel (PLAVIX) 75 MG tablet, Take 1 tablet (75 mg total) by mouth every other day., Disp: 90 tablet, Rfl: 1 .  ezetimibe (ZETIA) 10 MG tablet, Take 10 mg by mouth at bedtime. , Disp: , Rfl:  .  meclizine (ANTIVERT) 25 MG tablet, Take 25 mg by mouth 3 (three) times daily as needed for dizziness., Disp: , Rfl:  .  metoprolol succinate (TOPROL-XL) 50 MG 24 hr tablet, Take 50 mg by mouth daily., Disp: , Rfl:  .  mometasone (NASONEX) 50 MCG/ACT nasal spray, Place 2 sprays into the nose daily., Disp: , Rfl:  .  Multiple Vitamin (MULTIVITAMIN WITH MINERALS) TABS tablet, Take 1 tablet by mouth daily., Disp: , Rfl:     Allergies  Allergen Reactions  . Aspirin Anaphylaxis  . Ceclor [Cefaclor] Anaphylaxis  . Statins Other (See Comments)    Elevated liver enzymes  . Penicillins Rash    Has patient had a PCN reaction causing immediate rash, facial/tongue/throat swelling, SOB or lightheadedness with hypotension: Yes Has patient had a PCN reaction causing severe rash involving mucus membranes or skin  necrosis: No Has patient had a PCN reaction that required hospitalization No Has patient had a PCN reaction occurring within the last 10 years: No If all of the above answers are "NO", then may proceed with Cephalosporin use.  . Sulfa Antibiotics Rash    Past Medical History:  Diagnosis Date  . Arthritis   . Hyperlipidemia   . Hypertension   . Lipoma    left arm  . Nasal polyps      Past Surgical History:  Procedure Laterality Date  . CHOLECYSTECTOMY  02/27/04  . INCONTINENCE SURGERY  09/12/09  . ORIF ANKLE FRACTURE Right 09/11/2013   Procedure: OPEN REDUCTION INTERNAL FIXATION (ORIF) ANKLE FRACTURE;  Surgeon: Augustin Schooling, MD;  Location: [facility];  Service: Orthopedics;  Laterality: Right;  . ROTATOR CUFF REPAIR  09/27/2003  . TRIGGER FINGER RELEASE  10/29/06    Review of Systems  Constitutional: Positive for chills, fever and malaise/fatigue.  HENT: Positive for congestion and ear pain. Negative for sinus pain and sore throat.   Eyes: Negative for blurred vision, double vision, discharge and redness.  Respiratory: Positive for cough. Negative for hemoptysis, shortness of breath and wheezing.   Cardiovascular: Negative for chest pain.  Gastrointestinal: Negative for abdominal pain, diarrhea, nausea and vomiting.  Genitourinary: Positive for frequency. Negative for dysuria, flank pain and hematuria.  Musculoskeletal: Negative for myalgias.  Skin: Negative for rash.  Neurological: Negative for dizziness, weakness and headaches.  Psychiatric/Behavioral: Negative for depression and substance abuse.    Objective:   Vitals: BP (!) 147/80 (BP Location: Right Arm)   Pulse 79   Temp 98.2 F (36.8 C) (Oral)   Resp 16   SpO2 95%   Physical Exam Constitutional:      General: He is not in acute distress.    Appearance: Normal appearance. He is well-developed. He is not ill-appearing, toxic-appearing or diaphoretic.  HENT:     Head: Normocephalic and atraumatic.     Nose: Nose  normal.     Mouth/Throat:     Mouth: Mucous membranes are moist.  Eyes:     Extraocular Movements: Extraocular movements intact.     Pupils: Pupils are equal, round, and reactive to light.  Cardiovascular:     Rate and Rhythm: Normal rate and regular rhythm.     Pulses: Normal pulses.     Heart sounds: Normal heart sounds. No murmur. No friction rub. No gallop.   Pulmonary:     Effort: Pulmonary effort is normal. No respiratory distress.     Breath sounds: Normal breath sounds. No stridor. No wheezing, rhonchi or rales.  Skin:    General: Skin is warm and dry.     Findings: No rash.  Neurological:     Mental Status: He is alert and oriented to person, place, and time.  Psychiatric:        Mood and Affect: Mood normal.        Behavior: Behavior normal.        Thought Content: Thought content normal.        Judgment: Judgment normal.     Results for orders placed or performed during the hospital encounter of 08/23/18 (from the past 24 hour(s))  POCT urinalysis dip (device)     Status: Abnormal   Collection Time: 08/23/18 10:44 AM  Result Value Ref Range   Glucose, UA NEGATIVE NEGATIVE mg/dL   Bilirubin Urine NEGATIVE NEGATIVE   Ketones, ur NEGATIVE NEGATIVE mg/dL   Specific Gravity, Urine 1.020 1.005 - 1.030   Hgb urine dipstick NEGATIVE NEGATIVE   pH 6.5 5.0 - 8.0   Protein, ur NEGATIVE NEGATIVE mg/dL   Urobilinogen, UA 0.2 0.0 - 1.0 mg/dL   Nitrite NEGATIVE NEGATIVE   Leukocytes,Ua SMALL (A) NEGATIVE    Dg Chest 2 View  Result Date: 08/23/2018 CLINICAL DATA:  Patient with fever, fatigue and loss of appetite. EXAM: CHEST - 2 VIEW COMPARISON:  Chest radiograph 03/30/2016 FINDINGS: Normal cardiac and mediastinal contours. Aortic atherosclerosis. Large hiatal hernia. No large area pulmonary consolidation. No pleural effusion or pneumothorax. Regional skeleton is unremarkable. IMPRESSION: No acute cardiopulmonary process. Electronically Signed   By: Lovey Newcomer M.D.   On:  08/23/2018 11:25     Assessment and Plan :    1. Malaise   2. Cough   3. Left ear pain   4. Fever, unspecified   5. Pain in upper jaw   6. Urinary frequency     Likely viral in etiology. Counseled patient on nature of COVID-19 including modes of transmission, diagnostic testing, management and supportive care.  Offered symptomatic relief. COVID 19 testing is pending.  Recommended patient recheck with her ENT physician for her chronic sinus issues and possible TMJ dysfunction.  Urine cultures pending.  Counseled patient on potential for adverse effects with medications prescribed/recommended today, ER and return-to-clinic precautions discussed, patient verbalized understanding.     Jaynee Eagles, PA-C  08/23/18 1140  

## 2018-08-23 NOTE — ED Triage Notes (Signed)
Per pt she has  Been ah ving fatigue fevers and left ear pain for about 3 days now. Pt had no fevers today and has not taking any thing. Pt says she just feels very tired and left ear pain with a slight sore throat.

## 2018-08-24 LAB — NOVEL CORONAVIRUS, NAA (HOSP ORDER, SEND-OUT TO REF LAB; TAT 18-24 HRS): SARS-CoV-2, NAA: NOT DETECTED

## 2018-08-25 LAB — URINE CULTURE: Culture: >=100000 — AB

## 2018-09-12 ENCOUNTER — Emergency Department (HOSPITAL_COMMUNITY)
Admission: EM | Admit: 2018-09-12 | Discharge: 2018-09-12 | Disposition: A | Discharge status: Home / Self Care | Payer: Federal, State, Local not specified - PPO | Attending: Emergency Medicine | Admitting: Emergency Medicine

## 2018-09-12 ENCOUNTER — Emergency Department (HOSPITAL_COMMUNITY): Payer: Federal, State, Local not specified - PPO

## 2018-09-12 ENCOUNTER — Other Ambulatory Visit: Payer: Self-pay

## 2018-09-12 ENCOUNTER — Encounter (HOSPITAL_COMMUNITY): Payer: Self-pay

## 2018-09-12 DIAGNOSIS — R0602 Shortness of breath: Secondary | ICD-10-CM | POA: Diagnosis not present

## 2018-09-12 DIAGNOSIS — R5383 Other fatigue: Secondary | ICD-10-CM | POA: Diagnosis not present

## 2018-09-12 DIAGNOSIS — R0600 Dyspnea, unspecified: Secondary | ICD-10-CM

## 2018-09-12 DIAGNOSIS — I10 Essential (primary) hypertension: Secondary | ICD-10-CM | POA: Insufficient documentation

## 2018-09-12 DIAGNOSIS — Z79899 Other long term (current) drug therapy: Secondary | ICD-10-CM | POA: Insufficient documentation

## 2018-09-12 DIAGNOSIS — Z7902 Long term (current) use of antithrombotics/antiplatelets: Secondary | ICD-10-CM | POA: Diagnosis not present

## 2018-09-12 DIAGNOSIS — R0609 Other forms of dyspnea: Secondary | ICD-10-CM

## 2018-09-12 LAB — HEPATIC FUNCTION PANEL
ALT: 31 U/L (ref 0–44)
AST: 39 U/L (ref 15–41)
Albumin: 3.2 g/dL — ABNORMAL LOW (ref 3.5–5.0)
Alkaline Phosphatase: 206 U/L — ABNORMAL HIGH (ref 38–126)
Bilirubin, Direct: 0.2 mg/dL (ref 0.0–0.2)
Indirect Bilirubin: 0.8 mg/dL (ref 0.3–0.9)
Total Bilirubin: 1 mg/dL (ref 0.3–1.2)
Total Protein: 6.7 g/dL (ref 6.5–8.1)

## 2018-09-12 LAB — URINALYSIS, ROUTINE W REFLEX MICROSCOPIC
Bacteria, UA: NONE SEEN
Bilirubin Urine: NEGATIVE
Glucose, UA: NEGATIVE mg/dL
Hgb urine dipstick: NEGATIVE
Ketones, ur: 5 mg/dL — AB
Nitrite: NEGATIVE
Protein, ur: NEGATIVE mg/dL
Specific Gravity, Urine: 1.019 (ref 1.005–1.030)
pH: 6 (ref 5.0–8.0)

## 2018-09-12 LAB — BASIC METABOLIC PANEL
Anion gap: 10 (ref 5–15)
BUN: 16 mg/dL (ref 8–23)
CO2: 24 mmol/L (ref 22–32)
Calcium: 9.1 mg/dL (ref 8.9–10.3)
Chloride: 104 mmol/L (ref 98–111)
Creatinine, Ser: 1.04 mg/dL — ABNORMAL HIGH (ref 0.44–1.00)
GFR calc Af Amer: 57 mL/min — ABNORMAL LOW (ref >60)
GFR calc non Af Amer: 49 mL/min — ABNORMAL LOW (ref >60)
Glucose, Bld: 133 mg/dL — ABNORMAL HIGH (ref 70–99)
Potassium: 3.8 mmol/L (ref 3.5–5.1)
Sodium: 138 mmol/L (ref 135–145)

## 2018-09-12 LAB — CBC
HCT: 33.6 % — ABNORMAL LOW (ref 36.0–46.0)
Hemoglobin: 10.8 g/dL — ABNORMAL LOW (ref 12.0–15.0)
MCH: 29 pg (ref 26.0–34.0)
MCHC: 32.1 g/dL (ref 30.0–36.0)
MCV: 90.3 fL (ref 80.0–100.0)
Platelets: 331 10*3/uL (ref 150–400)
RBC: 3.72 MIL/uL — ABNORMAL LOW (ref 3.87–5.11)
RDW: 12.2 % (ref 11.5–15.5)
WBC: 8 10*3/uL (ref 4.0–10.5)
nRBC: 0 % (ref 0.0–0.2)

## 2018-09-12 LAB — TROPONIN I (HIGH SENSITIVITY)
Troponin I (High Sensitivity): 9 ng/L (ref <18)
Troponin I (High Sensitivity): 9 ng/L (ref <18)

## 2018-09-12 LAB — BRAIN NATRIURETIC PEPTIDE: B Natriuretic Peptide: 105.4 pg/mL — ABNORMAL HIGH (ref 0.0–100.0)

## 2018-09-12 LAB — TSH: TSH: 0.015 u[IU]/mL — ABNORMAL LOW (ref 0.350–4.500)

## 2018-09-12 MED ORDER — SODIUM CHLORIDE 0.9 % IV SOLN
Freq: Once | INTRAVENOUS | Status: AC
  Administered 2018-09-12: 20:00:00 via INTRAVENOUS

## 2018-09-12 MED ORDER — NITROFURANTOIN MONOHYD MACRO 100 MG PO CAPS: 100.0000 mg | ORAL_CAPSULE | Freq: Two times a day (BID) | ORAL | 0 refills | Status: DC

## 2018-09-12 MED ORDER — SODIUM CHLORIDE 0.9% FLUSH: 3.0000 mL | Freq: Once | INTRAVENOUS | Status: DC

## 2018-09-12 MED ORDER — SODIUM CHLORIDE 0.9 % IV BOLUS
250.0000 mL | Freq: Once | INTRAVENOUS | Status: AC
  Administered 2018-09-12: 250 mL via INTRAVENOUS

## 2018-09-12 MED ORDER — IOHEXOL 350 MG/ML SOLN
75.0000 mL | Freq: Once | INTRAVENOUS | Status: AC | PRN
  Administered 2018-09-12: 75 mL via INTRAVENOUS

## 2018-09-12 NOTE — ED Notes (Signed)
Patient verbalizes understanding of discharge instructions. Opportunity for questioning and answers were provided. Armband removed by staff, pt discharged from ED.  

## 2018-09-12 NOTE — ED Triage Notes (Addendum)
Pt reports increased fatigue and shortness of breath for the past month along with night sweats and significant weight loss. Pt tested negative for COVID last night.  Pt accompanied by family member. Pt a.o, no resp distress noted.

## 2018-09-12 NOTE — ED Notes (Signed)
Per lab- able to add on troponin and BNP to previous lab work

## 2018-09-12 NOTE — ED Notes (Signed)
Patient transported to CT 

## 2018-09-12 NOTE — ED Notes (Signed)
Pt called for transport home

## 2018-09-12 NOTE — Discharge Instructions (Signed)
1.  Follow-up with Dr. Einar Gip on Monday or Tuesday. 2.  Return to the emergency department for any worsening or changing symptoms.

## 2018-09-12 NOTE — ED Notes (Signed)
[name] [name], daughter. [phone]

## 2018-09-12 NOTE — ED Provider Notes (Signed)
[facility] EMERGENCY DEPARTMENT Provider Note   CSN: [id number] Arrival date & time: 09/12/18  1303     History   Chief Complaint Chief Complaint  Patient presents with  . Shortness of Breath    HPI [name] [name] is a 85 y.o. female.     HPI Patient reports that she has been incrementally getting more short of breath and fatigued really starting as far back as this spring.  She reports at that time she was walking about a mile at a time but now she gets very fatigued and winded and is having a hard time doing any kind of exercising.  She reports even minor activities and she feels really worn out and has to rest.  She reports she gets short of breath very easily.  She denies she is having chest pain.  She reports she has had a little bit of cough but is not been real pronounced.  She has not had sputum production.  She reports her highest temperature is been about 99.  She reports she did get tested for coronavirus and got her results back yesterday and they were negative.  She denies she is had any vomiting or diarrhea.  She reports she has had some constipation.  No abdominal pain.  She has not been having any swelling or pain in the legs.   Patient reports her cardiologist is Dr. Einar Gip. Past Medical History:  Diagnosis Date  . Arthritis   . Hyperlipidemia   . Hypertension   . Lipoma    left arm  . Nasal polyps     Patient Active Problem List   Diagnosis Date Noted  . Nasal polyposis 03/31/2018  . Osteoarthritis of knee 07/28/2017  . Pain in right knee 06/12/2017  . Lumbar radiculopathy 06/03/2017  . Pain in lower limb 05/29/2017  . Lumbar spondylosis 05/12/2017  . Chronic maxillary sinusitis 12/20/2015  . Upper airway cough syndrome 11/21/2015  . Hyperlipidemia 09/25/2013  . Allergic rhinitis, cause unspecified 09/15/2013  . Unspecified constipation 09/15/2013  . Fracture dislocation of ankle joint 09/11/2013  . Bimalleolar ankle fracture  09/11/2013  . TIA (transient ischemic attack) 06/24/2012  . HTN (hypertension) 06/24/2012  . Radicular pain in left arm 03/12/2011    Past Surgical History:  Procedure Laterality Date  . CHOLECYSTECTOMY  02/27/04  . INCONTINENCE SURGERY  09/12/09  . ORIF ANKLE FRACTURE Right 09/11/2013   Procedure: OPEN REDUCTION INTERNAL FIXATION (ORIF) ANKLE FRACTURE;  Surgeon: Augustin Schooling, MD;  Location: [facility];  Service: Orthopedics;  Laterality: Right;  . ROTATOR CUFF REPAIR  09/27/2003  . TRIGGER FINGER RELEASE  10/29/06     OB History   No obstetric history on file.      Home Medications    Prior to Admission medications   Medication Sig Start Date End Date Taking? Authorizing Provider  alendronate (FOSAMAX) 70 MG tablet  12/23/17   [provider]  benzonatate (TESSALON) 100 MG capsule Take 1-2 capsules (100-200 mg total) by mouth 3 (three) times daily as needed. 08/23/18   Jaynee Eagles, PA-C  Calcium Carb-Cholecalciferol (CALCIUM 1000 + D PO) Take 1 tablet by mouth daily.    [provider]  cetirizine (ZYRTEC) 10 MG tablet Take 10 mg by mouth daily.    [provider]  clopidogrel (PLAVIX) 75 MG tablet Take 1 tablet (75 mg total) by mouth every other day. 06/08/18   Adrian Prows, MD  ezetimibe (ZETIA) 10 MG tablet Take 10 mg  by mouth at bedtime.     [provider]  meclizine (ANTIVERT) 25 MG tablet Take 25 mg by mouth 3 (three) times daily as needed for dizziness.    [provider]  metoprolol succinate (TOPROL-XL) 50 MG 24 hr tablet Take 50 mg by mouth daily. 06/11/18   [provider]  mometasone (NASONEX) 50 MCG/ACT nasal spray Place 2 sprays into the nose daily.    [provider]  Multiple Vitamin (MULTIVITAMIN WITH MINERALS) TABS tablet Take 1 tablet by mouth daily.    [provider]  nitrofurantoin, macrocrystal-monohydrate, (MACROBID) 100 MG capsule Take 1 capsule (100 mg total) by mouth 2 (two) times daily. X 7 days  09/12/18   Charlesetta Shanks, MD    Family History Family History  Problem Relation Age of Onset  . Tuberculosis Mother   . Other Father        Bronchiectasis  . Cancer Maternal Grandmother        ovarian  . Sarcoidosis Sister   . Asthma Maternal Grandfather     Social History Social History   Tobacco Use  . Smoking status: Never Smoker  . Smokeless tobacco: Never Used  Substance Use Topics  . Alcohol use: No  . Drug use: No     Allergies   Aspirin, Ceclor [cefaclor], Statins, Penicillins, and Sulfa antibiotics   Review of Systems Review of Systems 10 Systems reviewed and are negative for acute change except as noted in the HPI.  Physical Exam Updated Vital Signs BP 140/73   Pulse 87   Temp 98.3 F (36.8 C) (Oral)   Resp (!) 24   SpO2 98%   Physical Exam Constitutional:      Comments: Alert and nontoxic normal mental status.  Well-nourished and well-developed for age.  HENT:     Head: Normocephalic and atraumatic.  Eyes:     Extraocular Movements: Extraocular movements intact.     Conjunctiva/sclera: Conjunctivae normal.  Neck:     Musculoskeletal: Neck supple.  Cardiovascular:     Pulses: Normal pulses.     Comments: Heart regular.  3\6 systolic ejection murmur. Pulmonary:     Effort: Pulmonary effort is normal.     Breath sounds: Normal breath sounds.  Abdominal:     General: There is no distension.     Palpations: Abdomen is soft.     Tenderness: There is no abdominal tenderness. There is no guarding.  Musculoskeletal: Normal range of motion.        General: No swelling or tenderness.     Comments: No peripheral edema.  Feet and lower legs are in excellent condition.  Dorsalis pedis pulses are 2+ and strong.  Skin:    General: Skin is warm and dry.  Neurological:     General: No focal deficit present.     Mental Status: He is oriented to person, place, and time.     Cranial Nerves: No cranial nerve deficit.     Coordination: Coordination  normal.  Psychiatric:        Mood and Affect: Mood normal.      ED Treatments / Results  Labs (all labs ordered are listed, but only abnormal results are displayed) Labs Reviewed  BASIC METABOLIC PANEL - Abnormal; Notable for the following components:      Result Value   Glucose, Bld 133 (*)    Creatinine, Ser 1.04 (*)    GFR calc non Af Amer 49 (*)    GFR calc Af Wyvonnia Lora  57 (*)    All other components within normal limits  CBC - Abnormal; Notable for the following components:   RBC 3.72 (*)    Hemoglobin 10.8 (*)    HCT 33.6 (*)    All other components within normal limits  BRAIN NATRIURETIC PEPTIDE - Abnormal; Notable for the following components:   B Natriuretic Peptide 105.4 (*)    All other components within normal limits  TSH - Abnormal; Notable for the following components:   TSH 0.015 (*)    All other components within normal limits  HEPATIC FUNCTION PANEL - Abnormal; Notable for the following components:   Albumin 3.2 (*)    Alkaline Phosphatase 206 (*)    All other components within normal limits  URINALYSIS, ROUTINE W REFLEX MICROSCOPIC - Abnormal; Notable for the following components:   Ketones, ur 5 (*)    Leukocytes,Ua SMALL (*)    All other components within normal limits  URINE CULTURE  POC OCCULT BLOOD, ED  TROPONIN I (HIGH SENSITIVITY)  TROPONIN I (HIGH SENSITIVITY)    EKG EKG Interpretation  Date/Time:  Saturday September 12 2018 13:21:04 EDT Ventricular Rate:  99 PR Interval:  144 QRS Duration: 90 QT Interval:  334 QTC Calculation: 428 R Axis:   -62 Text Interpretation:  Normal sinus rhythm Left axis deviation Anterior infarct , age undetermined Abnormal ECG anterior r wave decreasedcompared to previous,  Confirmed by Charlesetta Shanks [id number]) on 09/12/2018 3:53:26 PM   Radiology Dg Chest 2 View  Result Date: 09/12/2018 CLINICAL DATA:  Shortness of breath EXAM: CHEST - 2 VIEW COMPARISON:  08/23/2018 FINDINGS: Stable heart size and vascularity.  Mild hyperinflation. Large hiatal hernia with an air-fluid level projects over the cardiac silhouette. No superimposed pneumonia, collapse or consolidation. Minor left basilar scarring as before. Negative for edema, effusion or pneumothorax. Trachea midline. Aorta atherosclerotic. Degenerative changes and scoliosis of the spine. Remote cholecystectomy. IMPRESSION: Stable chest exam.  Large hiatal hernia. Electronically Signed   By: Jerilynn Mages.  Shick M.D.   On: 09/12/2018 14:24   Ct Angio Chest Pe W/cm &/or Wo Cm  Result Date: 09/12/2018 CLINICAL DATA:  Fatigue, shortness of breath EXAM: CT ANGIOGRAPHY CHEST WITH CONTRAST TECHNIQUE: Multidetector CT imaging of the chest was performed using the standard protocol during bolus administration of intravenous contrast. Multiplanar CT image reconstructions and MIPs were obtained to evaluate the vascular anatomy. CONTRAST:  88mL OMNIPAQUE IOHEXOL 350 MG/ML SOLN COMPARISON:  None. FINDINGS: Cardiovascular: Diffuse aortic atherosclerosis and coronary artery calcifications. Heart is enlarged. Aorta is tortuous, nonaneurysmal. No filling defects in the pulmonary arteries to suggest pulmonary emboli. Mediastinum/Nodes: No mediastinal, hilar, or axillary adenopathy. Large hiatal hernia. Lungs/Pleura: Atelectasis at the left base. 5 mm right middle lobe nodule. No confluent opacity on the right. No effusions. Upper Abdomen: Imaging into the upper abdomen shows no acute findings. Prior cholecystectomy Musculoskeletal: Chest wall soft tissues are unremarkable. No acute bony abnormality. Review of the MIP images confirms the above findings. IMPRESSION: No evidence of pulmonary embolus. Large hiatal hernia. Left base atelectasis. 5 mm right middle lobe nodule. Cardiomegaly, diffuse coronary artery disease. Aortic Atherosclerosis (ICD10-I70.0). Electronically Signed   By: Rolm Baptise M.D.   On: 09/12/2018 20:30    Procedures Procedures (including critical care time)  Medications  Ordered in ED Medications  sodium chloride flush (NS) 0.9 % injection 3 mL (3 mLs Intravenous Not Given 09/12/18 1602)  sodium chloride 0.9 % bolus 250 mL (250 mLs Intravenous New Bag/Given 09/12/18 1940)  0.9 %  sodium chloride infusion ( Intravenous New Bag/Given 09/12/18 1940)  iohexol (OMNIPAQUE) 350 MG/ML injection 75 mL (75 mLs Intravenous Contrast Given 09/12/18 2016)     Initial Impression / Assessment and Plan / ED Course  I have reviewed the triage vital signs and the nursing notes.  Pertinent labs & imaging results that were available during my care of the patient were reviewed by me and considered in my medical decision making (see chart for details).  Clinical Course as of Sep 12 2055  Sat Sep 12, 2018  1947 Consult: Reviewed with Dr. Einar Gip.  Advises that patient can follow-up in the office at the beginning of the week if other diagnostic evaluation is within normal limits.   [MP]    Clinical Course User Index [MP] Charlesetta Shanks, MD      Patient has had increasing fatigue and shortness of breath for several months to weeks duration.  It has progressively worsened.  At this time, patient is alert and appropriate.  No mental status change.  She does have notable murmur on cardiac exam.  Chest x-ray however is clear and BNP normal without clinical or diagnostic signs of acute volume overload.  Troponin negative.  I have reviewed this with Dr. Einar Gip.  He will see the patient in his office early this week to proceed with additional diagnostic work-up.  Patient and daughter's preference is to continue work-up on outpatient basis.  She does have stable oxygenation and no chest pain.  Patient has had problem with recurrent UTI.  UA is still testing slightly positive.  Patient has several allergies.  Will use Macrobid based on last sensitivities.  Return precautions reviewed.  Patient's daughter is a [employer] scientist and well in taking care of her in the follow-up plan.  Final Clinical  Impressions(s) / ED Diagnoses   Final diagnoses:  Shortness of breath  Fatigue, unspecified type  Dyspnea on exertion    ED Discharge Orders         Ordered    nitrofurantoin, macrocrystal-monohydrate, (MACROBID) 100 MG capsule  2 times daily     09/12/18 2056           Charlesetta Shanks, MD 09/12/18 2059

## 2018-09-14 ENCOUNTER — Other Ambulatory Visit: Payer: Self-pay

## 2018-09-14 ENCOUNTER — Ambulatory Visit (INDEPENDENT_AMBULATORY_CARE_PROVIDER_SITE_OTHER): Payer: Federal, State, Local not specified - PPO | Admitting: Cardiology

## 2018-09-14 ENCOUNTER — Encounter: Payer: Self-pay | Admitting: Cardiology

## 2018-09-14 VITALS — BP 137/81 | HR 106 | Temp 96.9°F | Ht 60.0 in | Wt 117.0 lb

## 2018-09-14 DIAGNOSIS — R0600 Dyspnea, unspecified: Secondary | ICD-10-CM

## 2018-09-14 DIAGNOSIS — I6523 Occlusion and stenosis of bilateral carotid arteries: Secondary | ICD-10-CM

## 2018-09-14 DIAGNOSIS — I2510 Atherosclerotic heart disease of native coronary artery without angina pectoris: Secondary | ICD-10-CM | POA: Diagnosis not present

## 2018-09-14 DIAGNOSIS — R7989 Other specified abnormal findings of blood chemistry: Secondary | ICD-10-CM

## 2018-09-14 DIAGNOSIS — I350 Nonrheumatic aortic (valve) stenosis: Secondary | ICD-10-CM | POA: Diagnosis not present

## 2018-09-14 DIAGNOSIS — E7800 Pure hypercholesterolemia, unspecified: Secondary | ICD-10-CM

## 2018-09-14 DIAGNOSIS — R0609 Other forms of dyspnea: Secondary | ICD-10-CM | POA: Diagnosis not present

## 2018-09-14 LAB — URINE CULTURE: Culture: <10000 — AB

## 2018-09-14 NOTE — Progress Notes (Signed)
Primary Physician/Referring:  Kelton Pillar, MD  Patient ID: [name] [name], female    DOB: [birth year], 85 y.o.   MRN: [id number]  Chief Complaint  Patient presents with   Shortness of Breath   Follow-up    hospital   HPI:    [name] [name]  is a 85 y.o. Caucasian female with history of benign positional vertigo, chronic dyspnea, chronic back pain, hypertension, mixed hyperlipidemia and mild hyper lipidemia who has had negative nuclear stress test on 04/25/2017 and also met aortic stenosis by echocardiogram on 05/16/2017 presents here after recent emergency room visit on 09/12/2018 with worsening dyspnea.  In the emergency room her labs were fairly stable, she was ruled out for ACS and BNP was 105 and CT angiogram of the chest was negative for PE and she was discharged home with recommendation to follow-up in the outpatient basis.  Patient's daughter states that her mom has been having worsening dyspnea over the past 3 to 4 months, loud wheezing is also heard at times, she is also noticed marked decrease her physical capacity in doing things.  Daughter also has noticed elevated heart rate.  There is no PND or orthopnea, no leg edema.  No chest pain, dizziness or syncope.  Past Medical History:  Diagnosis Date   Arthritis    Hyperlipidemia    Hypertension    Lipoma    left arm   Nasal polyps    Past Surgical History:  Procedure Laterality Date   CHOLECYSTECTOMY  02/27/04   INCONTINENCE SURGERY  09/12/09   ORIF ANKLE FRACTURE Right 09/11/2013   Procedure: OPEN REDUCTION INTERNAL FIXATION (ORIF) ANKLE FRACTURE;  Surgeon: Augustin Schooling, MD;  Location: [facility];  Service: Orthopedics;  Laterality: Right;   ROTATOR CUFF REPAIR  09/27/2003   TRIGGER FINGER RELEASE  10/29/06   Social History   Socioeconomic History   Marital status: Married    Spouse name: Not on file   Number of children: 3   Years of education: Not on file   Highest education level: Not on file    Occupational History   Not on file  Social Needs   Financial resource strain: Not on file   Food insecurity    Worry: Not on file    Inability: Not on file   Transportation needs    Medical: Not on file    Non-medical: Not on file  Tobacco Use   Smoking status: Never Smoker   Smokeless tobacco: Never Used  Substance and Sexual Activity   Alcohol use: No   Drug use: No   Sexual activity: Not on file  Lifestyle   Physical activity    Days per week: Not on file    Minutes per session: Not on file   Stress: Not on file  Relationships   Social connections    Talks on phone: Not on file    Gets together: Not on file    Attends religious service: Not on file    Active member of club or organization: Not on file    Attends meetings of clubs or organizations: Not on file    Relationship status: Not on file   Intimate partner violence    Fear of current or ex partner: Not on file    Emotionally abused: Not on file    Physically abused: Not on file    Forced sexual activity: Not on file  Other Topics Concern   Not on file  Social History Narrative  Not on file   ROS  Review of Systems  Constitution: Negative for chills, decreased appetite, malaise/fatigue and weight gain.  Cardiovascular: Positive for dyspnea on exertion. Negative for leg swelling and syncope.  Respiratory: Positive for wheezing.   Endocrine: Negative for cold intolerance.  Hematologic/Lymphatic: Does not bruise/bleed easily.  Musculoskeletal: Positive for back pain (spinal stenosis) and joint pain (DJD diffuse). Negative for joint swelling.  Gastrointestinal: Negative for abdominal pain, anorexia, change in bowel habit, hematochezia and melena.  Neurological: Negative for headaches and light-headedness.  Psychiatric/Behavioral: Negative for depression and substance abuse.  All other systems reviewed and are negative.  Objective  Blood pressure 137/81, pulse (!) 106, temperature (!) 96.9  F (36.1 C), height 5' (1.524 m), weight 117 lb (53.1 kg), SpO2 97 %. Body mass index is 22.85 kg/m.   Physical Exam  Constitutional: She appears well-developed and well-nourished. No distress.  HENT:  Head: Atraumatic.  Eyes: Conjunctivae are normal.  Neck: Neck supple. No JVD present. No thyromegaly present.  Cardiovascular: Normal rate, regular rhythm, S1 normal, S2 normal and intact distal pulses. Exam reveals no gallop.  Murmur heard.  Harsh midsystolic murmur is present with a grade of 3/6 at the upper right sternal border radiating to the neck. Pulses:      Carotid pulses are on the right side with bruit and on the left side with bruit.      Popliteal pulses are 2+ on the right side and 2+ on the left side.       Dorsalis pedis pulses are 2+ on the right side and 2+ on the left side.       Posterior tibial pulses are 2+ on the right side and 2+ on the left side.  Pulmonary/Chest: Effort normal and breath sounds normal.  Abdominal: Soft. Bowel sounds are normal.  Musculoskeletal: Normal range of motion.        General: No edema.  Neurological: She is alert.  Skin: Skin is warm and dry.  Psychiatric: She has a normal mood and affect.   Radiology: Dg Chest 2 View  Result Date: 09/12/2018 CLINICAL DATA:  Shortness of breath EXAM: CHEST - 2 VIEW COMPARISON:  08/23/2018 FINDINGS: Stable heart size and vascularity. Mild hyperinflation. Large hiatal hernia with an air-fluid level projects over the cardiac silhouette. No superimposed pneumonia, collapse or consolidation. Minor left basilar scarring as before. Negative for edema, effusion or pneumothorax. Trachea midline. Aorta atherosclerotic. Degenerative changes and scoliosis of the spine. Remote cholecystectomy. IMPRESSION: Stable chest exam.  Large hiatal hernia. Electronically Signed   By: Jerilynn Mages.  Shick M.D.   On: 09/12/2018 14:24   Ct Angio Chest Pe W/cm &/or Wo Cm  Result Date: 09/12/2018 CLINICAL DATA:  Fatigue, shortness of breath  EXAM: CT ANGIOGRAPHY CHEST WITH CONTRAST TECHNIQUE: Multidetector CT imaging of the chest was performed using the standard protocol during bolus administration of intravenous contrast. Multiplanar CT image reconstructions and MIPs were obtained to evaluate the vascular anatomy. CONTRAST:  75m OMNIPAQUE IOHEXOL 350 MG/ML SOLN COMPARISON:  None. FINDINGS: Cardiovascular: Diffuse aortic atherosclerosis and coronary artery calcifications. Heart is enlarged. Aorta is tortuous, nonaneurysmal. No filling defects in the pulmonary arteries to suggest pulmonary emboli. Mediastinum/Nodes: No mediastinal, hilar, or axillary adenopathy. Large hiatal hernia. Lungs/Pleura: Atelectasis at the left base. 5 mm right middle lobe nodule. No confluent opacity on the right. No effusions. Upper Abdomen: Imaging into the upper abdomen shows no acute findings. Prior cholecystectomy Musculoskeletal: Chest wall soft tissues are unremarkable. No acute bony abnormality.  Review of the MIP images confirms the above findings. IMPRESSION: No evidence of pulmonary embolus. Large hiatal hernia. Left base atelectasis. 5 mm right middle lobe nodule. Cardiomegaly, diffuse coronary artery disease. Aortic Atherosclerosis (ICD10-I70.0). Electronically Signed   By: Rolm Baptise M.D.   On: 09/12/2018 20:30   Laboratory examination:   Recent Labs    09/12/18 1327  NA 138  K 3.8  CL 104  CO2 24  GLUCOSE 133*  BUN 16  CREATININE 1.04*  CALCIUM 9.1  GFRNONAA 49*  GFRAA 57*   CMP Latest Ref Rng & Units 09/12/2018 03/30/2016 09/13/2013  Glucose 70 - 99 mg/dL 133(H) 106(H) 112(H)  BUN 8 - 23 mg/dL '16 13 18  ' Creatinine 0.44 - 1.00 mg/dL 1.04(H) 1.24(H) 1.01  Sodium 135 - 145 mmol/L 138 135 141  Potassium 3.5 - 5.1 mmol/L 3.8 3.1(L) 3.3(L)  Chloride 98 - 111 mmol/L 104 96(L) 100  CO2 22 - 32 mmol/L '24 27 30  ' Calcium 8.9 - 10.3 mg/dL 9.1 9.2 9.0  Total Protein 6.5 - 8.1 g/dL 6.7 7.3 -  Total Bilirubin 0.3 - 1.2 mg/dL 1.0 0.5 -  Alkaline Phos  38 - 126 U/L 206(H) 206(H) -  AST 15 - 41 U/L 39 74(H) -  ALT 0 - 44 U/L 31 64(H) -   CBC Latest Ref Rng & Units 09/12/2018 03/30/2016 09/11/2013  WBC 4.0 - 10.5 K/uL 8.0 8.3 9.8  Hemoglobin 12.0 - 15.0 g/dL 10.8(L) 12.5 12.4  Hematocrit 36.0 - 46.0 % 33.6(L) 37.4 37.3  Platelets 150 - 400 K/uL 331 276 243   Lipid Panel   07/25/2017: Hemoglobin A1c 9%. Creatinine 1.16, EGFR 54, potassium 4.5, alkaline loss 420, AST 55, CMP otherwise normal. ( PCP has held Zetia and simvastatin for 2 months due to elevated LFTs). Hemoglobin 11.8.   Cholesterol 157, triglycerides 105, HDL 60, LDL 78.  05/27/2017: Cholesterol 208, triglycerides 84, HDL 81, LDL 110.     Component Value Date/Time   CHOL 194 06/25/2012 0625   TRIG 98 06/25/2012 0625   HDL 56 06/25/2012 0625   CHOLHDL 3.5 06/25/2012 0625   VLDL 20 06/25/2012 0625   LDLCALC 118 (H) 06/25/2012 0625   HEMOGLOBIN A1C Lab Results  Component Value Date   HGBA1C 5.6 06/25/2012   MPG 114 06/25/2012   TSH Recent Labs    09/12/18 1640  TSH 0.015*   Medications   Prior to Admission medications   Medication Sig Start Date End Date Taking? Authorizing Provider  alendronate (FOSAMAX) 70 MG tablet  12/23/17  Yes [provider]  Calcium Carb-Cholecalciferol (CALCIUM 1000 + D PO) Take 1 tablet by mouth daily.   Yes [provider]  cetirizine (ZYRTEC) 10 MG tablet Take 10 mg by mouth daily.   Yes [provider]  clopidogrel (PLAVIX) 75 MG tablet Take 1 tablet (75 mg total) by mouth every other day. 06/08/18  Yes Adrian Prows, MD  ezetimibe (ZETIA) 10 MG tablet Take 10 mg by mouth at bedtime.    Yes [provider]  meclizine (ANTIVERT) 25 MG tablet Take 25 mg by mouth 3 (three) times daily as needed for dizziness.   Yes [provider]  metoprolol succinate (TOPROL-XL) 50 MG 24 hr tablet Take 50 mg by mouth daily. 06/11/18  Yes [provider]  mometasone (NASONEX) 50 MCG/ACT nasal spray Place 2  sprays into the nose daily.   Yes [provider]  Multiple Vitamin (MULTIVITAMIN WITH MINERALS) TABS tablet Take 1 tablet  by mouth daily.   Yes [provider]  nitrofurantoin, macrocrystal-monohydrate, (MACROBID) 100 MG capsule Take 1 capsule (100 mg total) by mouth 2 (two) times daily. X 7 days 09/12/18  Yes Pfeiffer, Jeannie Done, MD  benzonatate (TESSALON) 100 MG capsule Take 1-2 capsules (100-200 mg total) by mouth 3 (three) times daily as needed. Patient not taking: Reported on 09/14/2018 08/23/18   Jaynee Eagles, PA-C     Current Outpatient Medications  Medication Instructions   alendronate (FOSAMAX) 70 MG tablet No dose, route, or frequency recorded.   benzonatate (TESSALON) 100-200 mg, Oral, 3 times daily PRN   Calcium Carb-Cholecalciferol (CALCIUM 1000 + D PO) 1 tablet, Oral, Daily   cetirizine (ZYRTEC) 10 mg, Daily   clopidogrel (PLAVIX) 75 mg, Oral, Every other day   ezetimibe (ZETIA) 10 mg, Oral, Daily at bedtime   meclizine (ANTIVERT) 25 mg, Oral, 3 times daily PRN   metoprolol succinate (TOPROL-XL) 50 mg, Oral, Daily   mometasone (NASONEX) 50 MCG/ACT nasal spray 2 sprays, Daily   Multiple Vitamin (MULTIVITAMIN WITH MINERALS) TABS tablet 1 tablet, Oral, Daily   nitrofurantoin (macrocrystal-monohydrate) (MACROBID) 100 mg, Oral, 2 times daily, X 7 days    Cardiac Studies:   Echocardiogram 05/14/2017: Left ventricle cavity is normal in size. Normal global wall motion. Normal diastolic filling pattern. Calculated EF 65%. Trileaflet aortic valve with mild (Grade I) regurgitation. Moderate aortic valve leaflet calcification. Moderately restricted aortic valve leaflets. Severe aortic valve stenosis by continuity equation and mild by Pressure Gradient crtiteria. Aortic valve peak pressure gradient of 38 and mean gradient of 20 mmHg, calculated aortic valve area 0.73 cm. Mild calcification of themitral valve annulus. Mild posterior mitral valve leaflet  calcification with mildly restricted mobility. No mitral stenosis. Mild (Grade I) mitral regurgitation. Mild tricuspid regurgitation. No evidence of pulmonary hypertension.   Exercise myoview stress 04/25/2017: 1. The patient performed treadmill exercise using a Bruce protocol, completing 5 minutes. The patient completed an estimated workload of 4.88 METS, reaching 103% of the maximum predicted heart rate. Stress symptoms included dyspnea. Exercise capacity fair for age. Normal hemodynamic response. Possible old anteroseptal infarct. Poor R wave progression. No ischemic changes on stress electrocardiogram. 2. The overall quality of the study is excellent. There is no evidence of abnormal lung activity. Stress and rest SPECT images demonstrate homogeneous tracer distribution throughout the myocardium. Gated SPECT imaging reveals normal myocardial thickening and wall motion. The left ventricular ejection fraction was normal (77%). 3. Low risk study.  Carotid artery duplex 11/13/2017: Minimal stenosis in the right internal carotid artery (1-15%). Stenosis in the left internal carotid artery (16-49%). Antegrade right vertebral artery flow. Antegrade left vertebral artery flow. Follow up in one year is appropriate if clinically indicated. Compared to 05/14/2017, Left carotid stenosis severity decreased from >50%.  Assessment     ICD-10-CM   1. Dyspnea on exertion  R06.09   2. Moderate aortic stenosis  I35.0   3. Bilateral carotid artery stenosis  I65.23   4. Hypercholesteremia  E78.00   5. Essential hypertension  I10     EKG 09/12/2018: Sinus tachycardia at rate of 100 bpm, left atrial abnormality, left axis deviation, left anterior fascicular block.  Anteroseptal infarct.  LVH.  Nonspecific ST abnormality, cannot exclude lateral ischemia. No significant change from  EKG 11/20/2017.  CT angiogram chest with contrast 09/12/2018: Cardiovascular: Diffuse aortic atherosclerosis and coronary artery  calcifications. Heart is enlarged. Aorta is tortuous, nonaneurysmal. No filling defect. No pulmonary embolism.  Large hiatal hernia.  Small 5  mm nodule.  Recommendations:   Patient recently seen in the emergency room for fatigue, dyspnea, had CT angiogram of the chest which was negative for pulmonary embolism but was noted to have a large hiatal hernia which is old.  Cardiomegaly was also noted.  As symptoms of dyspnea may be related to either worsening aortic stenosis but more importantly her TSH was markedly reduced and hyperthyroidism leading to tachycardia and worsening diastolic dysfunction needs to be excluded.  I will obtain an echocardiogram.  Unless this shows worsening aortic stenosis, evaluation for noncardiac causes for dyspnea including work-up for hypothyroidism and also patient has history of bronchial asthma and also a large hiatal hernia and GERD induced dyspnea need to be excluded as well.  Her daughter who is present over the telephone video visit while patient was visiting her clinic and I saw her personally asked questions and I have answered to the best of my ability regarding her mom's presentation presentation.  In view of hyperthyroidism, with reduced TSH, I would like to have started her on propranolol to reduce her heart rate however she has history of asthma hence I did not want to make this change.  They are going to see Dr. Kelton Pillar next week and they will discuss this further.  She has very mild carotid stenosis, we will continue annual surveillance. "Total time spent with patient was  40 minutes and greater than 50% of that time was spent in face to face discussion, counseling and coordination care"   Adrian Prows, MD, [facility] 09/14/2018, 11:02 AM [facility] [place] Pager: [phone] Office: [phone] If no answer Cell [phone]

## 2018-09-17 ENCOUNTER — Other Ambulatory Visit: Payer: Self-pay | Admitting: Family Medicine

## 2018-09-17 DIAGNOSIS — R946 Abnormal results of thyroid function studies: Secondary | ICD-10-CM

## 2018-09-23 ENCOUNTER — Telehealth: Payer: Self-pay

## 2018-09-23 ENCOUNTER — Other Ambulatory Visit: Payer: Self-pay

## 2018-09-23 DIAGNOSIS — G459 Transient cerebral ischemic attack, unspecified: Secondary | ICD-10-CM

## 2018-09-23 MED ORDER — PROPRANOLOL HCL 40 MG PO TABS: 40.0000 mg | ORAL_TABLET | Freq: Three times a day (TID) | ORAL | 3 refills | Status: DC

## 2018-09-23 NOTE — Telephone Encounter (Signed)
There is only a 60 80 and 120 mg

## 2018-09-23 NOTE — Telephone Encounter (Signed)
Dr. Laurann Montana called stating that she agrees you can starting her on the propanolol she says that she is not wheezing and she thinks that it will be ok after the echo; She says to touch base with her and let her know

## 2018-09-23 NOTE — Telephone Encounter (Signed)
Propranolol 20 mg TID

## 2018-09-23 NOTE — Telephone Encounter (Signed)
Plain propranolol you could do 40 mg TID

## 2018-10-01 ENCOUNTER — Ambulatory Visit (INDEPENDENT_AMBULATORY_CARE_PROVIDER_SITE_OTHER): Payer: Federal, State, Local not specified - PPO

## 2018-10-01 DIAGNOSIS — I350 Nonrheumatic aortic (valve) stenosis: Secondary | ICD-10-CM

## 2018-10-05 ENCOUNTER — Telehealth: Payer: Self-pay

## 2018-10-07 NOTE — Telephone Encounter (Signed)
Patient called for Echo results, please advise.

## 2018-10-08 NOTE — Telephone Encounter (Signed)
Normal heart function. Your AS severity has increased. Will recheck in 6 months prior to your visit with me. I will order another echo.  I had sent this message to her and she did read this.   Aortic valve is between the main chamber of the heart and the aorta. Its job is to prevent blood leaking back into the heart when the heart has finished pumping the blood out and is relaxing.  AS means Aortic stenosis, narrowing on the aortic valve. it can be mild, moderate or severe. Valve replacement needed only if severe AS. Otherwise simply to be monitored  by doctors or serial echocardiograms depending upon clinical situation.

## 2018-10-08 NOTE — Telephone Encounter (Signed)
S/W PT ADVISED HER OF ECHO RESULTS AND BOTH APPTS.

## 2018-10-26 ENCOUNTER — Encounter (HOSPITAL_COMMUNITY)
Admission: RE | Admit: 2018-10-26 | Discharge: 2018-10-26 | Disposition: A | Discharge status: Home / Self Care | Payer: Federal, State, Local not specified - PPO | Source: Ambulatory Visit | Attending: Family Medicine | Admitting: Family Medicine

## 2018-10-26 ENCOUNTER — Other Ambulatory Visit: Payer: Self-pay

## 2018-10-26 DIAGNOSIS — R946 Abnormal results of thyroid function studies: Secondary | ICD-10-CM | POA: Diagnosis not present

## 2018-10-26 MED ORDER — SODIUM IODIDE I-123 7.4 MBQ CAPS
474.0000 | ORAL_CAPSULE | Freq: Once | ORAL | Status: AC
  Administered 2018-10-26: 09:00:00 474 via ORAL

## 2018-10-27 ENCOUNTER — Encounter (HOSPITAL_COMMUNITY)
Admission: RE | Admit: 2018-10-27 | Discharge: 2018-10-27 | Disposition: A | Discharge status: Home / Self Care | Payer: Federal, State, Local not specified - PPO | Source: Ambulatory Visit | Attending: Family Medicine | Admitting: Family Medicine

## 2018-11-12 ENCOUNTER — Other Ambulatory Visit: Payer: Federal, State, Local not specified - PPO

## 2018-11-23 ENCOUNTER — Ambulatory Visit: Payer: Federal, State, Local not specified - PPO | Admitting: Cardiology

## 2018-11-23 ENCOUNTER — Ambulatory Visit (INDEPENDENT_AMBULATORY_CARE_PROVIDER_SITE_OTHER): Payer: Federal, State, Local not specified - PPO | Admitting: Cardiology

## 2018-11-23 ENCOUNTER — Encounter: Payer: Self-pay | Admitting: Cardiology

## 2018-11-23 VITALS — BP 129/67 | HR 48 | Temp 95.9°F | Ht 60.0 in | Wt 119.0 lb

## 2018-11-23 DIAGNOSIS — R0600 Dyspnea, unspecified: Secondary | ICD-10-CM | POA: Diagnosis not present

## 2018-11-23 DIAGNOSIS — I10 Essential (primary) hypertension: Secondary | ICD-10-CM

## 2018-11-23 DIAGNOSIS — G459 Transient cerebral ischemic attack, unspecified: Secondary | ICD-10-CM

## 2018-11-23 DIAGNOSIS — I2510 Atherosclerotic heart disease of native coronary artery without angina pectoris: Secondary | ICD-10-CM | POA: Diagnosis not present

## 2018-11-23 DIAGNOSIS — I350 Nonrheumatic aortic (valve) stenosis: Secondary | ICD-10-CM | POA: Diagnosis not present

## 2018-11-23 DIAGNOSIS — I6523 Occlusion and stenosis of bilateral carotid arteries: Secondary | ICD-10-CM

## 2018-11-23 DIAGNOSIS — E0590 Thyrotoxicosis, unspecified without thyrotoxic crisis or storm: Secondary | ICD-10-CM

## 2018-11-23 DIAGNOSIS — R0609 Other forms of dyspnea: Secondary | ICD-10-CM

## 2018-11-23 DIAGNOSIS — E7800 Pure hypercholesterolemia, unspecified: Secondary | ICD-10-CM

## 2018-11-23 MED ORDER — PROPRANOLOL HCL 40 MG PO TABS: 20.0000 mg | ORAL_TABLET | Freq: Three times a day (TID) | ORAL | 3 refills | Status: DC

## 2018-11-23 MED ORDER — ATORVASTATIN CALCIUM 10 MG PO TABS: 5.0000 mg | ORAL_TABLET | Freq: Every day | ORAL | 2 refills | Status: DC

## 2018-11-23 NOTE — Progress Notes (Signed)
Primary Physician/Referring:  Kelton Pillar, MD  Patient ID: [name] [name], female    DOB: 10/26/1931, 85 y.o.   MRN: [id number]  Chief Complaint  Patient presents with   Acute Visit   Shortness of Breath    med review   HPI:    [name] [name]  is a 85 y.o. Caucasian female with history of benign positional vertigo, chronic dyspnea, chronic back pain, hypertension, mixed hyperlipidemia and mild hyperlipidemia who has had negative nuclear stress test on 04/25/2017 and aortic stenosis presents for f/u of worsening dyspnea.    She is also noticed marked decrease her physical capacity in doing things. There is no PND or orthopnea, no leg edema.  No chest pain, dizziness or syncope.  Past Medical History:  Diagnosis Date   Arthritis    Hyperlipidemia    Hypertension    Lipoma    left arm   Nasal polyps    Past Surgical History:  Procedure Laterality Date   CHOLECYSTECTOMY  02/27/04   INCONTINENCE SURGERY  09/12/09   ORIF ANKLE FRACTURE Right 09/11/2013   Procedure: OPEN REDUCTION INTERNAL FIXATION (ORIF) ANKLE FRACTURE;  Surgeon: Augustin Schooling, MD;  Location: [facility];  Service: Orthopedics;  Laterality: Right;   ROTATOR CUFF REPAIR  09/27/2003   TRIGGER FINGER RELEASE  10/29/06   Social History   Socioeconomic History   Marital status: Married    Spouse name: Not on file   Number of children: 3   Years of education: Not on file   Highest education level: Not on file  Occupational History   Not on file  Social Needs   Financial resource strain: Not on file   Food insecurity    Worry: Not on file    Inability: Not on file   Transportation needs    Medical: Not on file    Non-medical: Not on file  Tobacco Use   Smoking status: Never Smoker   Smokeless tobacco: Never Used  Substance and Sexual Activity   Alcohol use: No   Drug use: No   Sexual activity: Not on file  Lifestyle   Physical activity    Days per week: Not on file    Minutes per  session: Not on file   Stress: Not on file  Relationships   Social connections    Talks on phone: Not on file    Gets together: Not on file    Attends religious service: Not on file    Active member of club or organization: Not on file    Attends meetings of clubs or organizations: Not on file    Relationship status: Not on file   Intimate partner violence    Fear of current or ex partner: Not on file    Emotionally abused: Not on file    Physically abused: Not on file    Forced sexual activity: Not on file  Other Topics Concern   Not on file  Social History Narrative   Not on file   ROS  Review of Systems  Constitution: Negative for chills, decreased appetite, malaise/fatigue and weight gain.  Cardiovascular: Positive for dyspnea on exertion. Negative for leg swelling and syncope.  Respiratory: Positive for wheezing.   Endocrine: Negative for cold intolerance.  Hematologic/Lymphatic: Does not bruise/bleed easily.  Musculoskeletal: Positive for back pain (spinal stenosis) and joint pain (DJD diffuse). Negative for joint swelling.  Gastrointestinal: Negative for abdominal pain, anorexia, change in bowel habit, hematochezia and melena.  Neurological: Negative  for headaches and light-headedness.  Psychiatric/Behavioral: Negative for depression and substance abuse.  All other systems reviewed and are negative.  Objective  Blood pressure 129/67, pulse (!) 48, temperature (!) 95.9 F (35.5 C), height 5' (1.524 m), weight 119 lb (54 kg), SpO2 97 %. Body mass index is 23.24 kg/m.   Physical Exam  Constitutional: She appears well-developed and well-nourished. No distress.  HENT:  Head: Atraumatic.  Eyes: Conjunctivae are normal.  Neck: Neck supple. No JVD present. No thyromegaly present.  Cardiovascular: Normal rate, regular rhythm, S1 normal, S2 normal and intact distal pulses. Exam reveals no gallop.  Murmur heard.  Harsh midsystolic murmur is present with a grade of 3/6  at the upper right sternal border radiating to the neck. Pulses:      Carotid pulses are on the right side with bruit and on the left side with bruit.      Popliteal pulses are 2+ on the right side and 2+ on the left side.       Dorsalis pedis pulses are 2+ on the right side and 2+ on the left side.       Posterior tibial pulses are 2+ on the right side and 2+ on the left side.  Pulmonary/Chest: Effort normal and breath sounds normal.  Abdominal: Soft. Bowel sounds are normal.  Musculoskeletal: Normal range of motion.        General: No edema.  Neurological: She is alert.  Skin: Skin is warm and dry.  Psychiatric: She has a normal mood and affect.   Radiology: No results found. Laboratory examination:   Recent Labs    09/12/18 1327  NA 138  K 3.8  CL 104  CO2 24  GLUCOSE 133*  BUN 16  CREATININE 1.04*  CALCIUM 9.1  GFRNONAA 49*  GFRAA 57*   CMP Latest Ref Rng & Units 09/12/2018 03/30/2016 09/13/2013  Glucose 70 - 99 mg/dL 133(H) 106(H) 112(H)  BUN 8 - 23 mg/dL _0 Creatinine 0.44 - 1.00 mg/dL 1.04(H) 1.24(H) 1.01  Sodium 135 - 145 mmol/L 138 135 141  Potassium 3.5 - 5.1 mmol/L 3.8 3.1(L) 3.3(L)  Chloride 98 - 111 mmol/L 104 96(L) 100  CO2 22 - 32 mmol/L _1 Calcium 8.9 - 10.3 mg/dL 9.1 9.2 9.0  Total Protein 6.5 - 8.1 g/dL 6.7 7.3 -  Total Bilirubin 0.3 - 1.2 mg/dL 1.0 0.5 -  Alkaline Phos 38 - 126 U/L 206(H) 206(H) -  AST 15 - 41 U/L 39 74(H) -  ALT 0 - 44 U/L 31 64(H) -   CBC Latest Ref Rng & Units 09/12/2018 03/30/2016 09/11/2013  WBC 4.0 - 10.5 K/uL 8.0 8.3 9.8  Hemoglobin 12.0 - 15.0 g/dL 10.8(L) 12.5 12.4  Hematocrit 36.0 - 46.0 % 33.6(L) 37.4 37.3  Platelets 150 - 400 K/uL 331 276 243   Lipid Panel   07/25/2017: Hemoglobin A1c 9%. Creatinine 1.16, EGFR 54, potassium 4.5, alkaline loss 420, AST 55, CMP otherwise normal. ( PCP has held Zetia and simvastatin for 2 months due to elevated LFTs). Hemoglobin 11.8.   Cholesterol 157, triglycerides 105, HDL  60, LDL 78.  05/27/2017: Cholesterol 208, triglycerides 84, HDL 81, LDL 110.     Component Value Date/Time   CHOL 194 06/25/2012 0625   TRIG 98 06/25/2012 0625   HDL 56 06/25/2012 0625   CHOLHDL 3.5 06/25/2012 0625   VLDL 20 06/25/2012 0625   LDLCALC 118 (H) 06/25/2012 0625   HEMOGLOBIN A1C Lab Results  Component Value Date   HGBA1C 5.6 06/25/2012   MPG 114 06/25/2012   TSH Recent Labs    09/12/18 1640  TSH 0.015*   Medications   Prior to Admission medications   Medication Sig Start Date End Date Taking? Authorizing Provider  alendronate (FOSAMAX) 70 MG tablet  12/23/17  Yes [provider]  Calcium Carb-Cholecalciferol (CALCIUM 1000 + D PO) Take 1 tablet by mouth daily.   Yes [provider]  cetirizine (ZYRTEC) 10 MG tablet Take 10 mg by mouth daily.   Yes [provider]  clopidogrel (PLAVIX) 75 MG tablet Take 1 tablet (75 mg total) by mouth every other day. 06/08/18  Yes Adrian Prows, MD  ezetimibe (ZETIA) 10 MG tablet Take 10 mg by mouth at bedtime.    Yes [provider]  meclizine (ANTIVERT) 25 MG tablet Take 25 mg by mouth 3 (three) times daily as needed for dizziness.   Yes [provider]  metoprolol succinate (TOPROL-XL) 50 MG 24 hr tablet Take 50 mg by mouth daily. 06/11/18  Yes [provider]  mometasone (NASONEX) 50 MCG/ACT nasal spray Place 2 sprays into the nose daily.   Yes [provider]  Multiple Vitamin (MULTIVITAMIN WITH MINERALS) TABS tablet Take 1 tablet by mouth daily.   Yes [provider]  nitrofurantoin, macrocrystal-monohydrate, (MACROBID) 100 MG capsule Take 1 capsule (100 mg total) by mouth 2 (two) times daily. X 7 days 09/12/18  Yes Pfeiffer, Jeannie Done, MD  benzonatate (TESSALON) 100 MG capsule Take 1-2 capsules (100-200 mg total) by mouth 3 (three) times daily as needed. Patient not taking: Reported on 09/14/2018 08/23/18   Jaynee Eagles, PA-C     Current Outpatient Medications    Medication Instructions   alendronate (FOSAMAX) 70 MG tablet occasionally   atorvastatin (LIPITOR) 5 mg, Oral, Daily   benzonatate (TESSALON) 100-200 mg, Oral, 3 times daily PRN   Calcium Carb-Cholecalciferol (CALCIUM 1000 + D PO) 1 tablet, Oral, Daily   cetirizine (ZYRTEC) 10 mg, Daily   clopidogrel (PLAVIX) 75 mg, Oral, Every other day   ezetimibe (ZETIA) 10 mg, Oral, Daily at bedtime   meclizine (ANTIVERT) 25 mg, Oral, 3 times daily PRN   mometasone (NASONEX) 50 MCG/ACT nasal spray 2 sprays, Daily   Multiple Vitamin (MULTIVITAMIN WITH MINERALS) TABS tablet 1 tablet, Oral, Daily   propranolol (INDERAL) 20 mg, Oral, 3 times daily    Cardiac Studies:    Exercise myoview stress 04/25/2017: 1. The patient performed treadmill exercise using a Bruce protocol, completing 5 minutes. The patient completed an estimated workload of 4.88 METS, reaching 103% of the maximum predicted heart rate. Stress symptoms included dyspnea. Exercise capacity fair for age. Normal hemodynamic response. Possible old anteroseptal infarct. Poor R wave progression. No ischemic changes on stress electrocardiogram. 2. The overall quality of the study is excellent. There is no evidence of abnormal lung activity. Stress and rest SPECT images demonstrate homogeneous tracer distribution throughout the myocardium. Gated SPECT imaging reveals normal myocardial thickening and wall motion. The left ventricular ejection fraction was normal (77%). 3. Low risk study.  Carotid artery duplex 11/13/2017: Minimal stenosis in the right internal carotid artery (1-15%). Stenosis in the left internal carotid artery (16-49%). Antegrade right vertebral artery flow. Antegrade left vertebral artery flow. Follow up in one year is appropriate if clinically indicated. Compared to 05/14/2017, Left carotid stenosis severity decreased from >50%.  Echocardiogram 10/01/2018: Left ventricle cavity is normal in size. Mild concentric  hypertrophy of the left ventricle. Normal  LV systolic function with EF 59%. Normal global wall motion. Doppler evidence of grade II (pseudonormal) diastolic dysfunction, elevated LAP.  Trileaflet aortic valve. Moderate calcification of the aortic valve annulus and leaflets. At least moderate aortic stenosis. Moderate (Grade II) aortic regurgitation. Aortic valve mean gradient of 32 mmHg, Vmax of 3.6  m/s. Calculated aortic valve area by continuity equation is 0.6 cm. Dimensionless index is 0.21. Findings suggest paradoxically low flow low gradient severe aortic stenosis. Consider clinical correlation and further workup. Mild calcification of mitral leaflets. Mild restriction without significant stenosis.  Moderate (Grade II) mitral regurgitation. Mild to moderate tricuspid regurgitation. Estimated pulmonary artery systolic pressure is 35 mmHg.  Compared to the study done on 05/14/2017, peak gradient was 38 and mean gradient was 20 mmHg and aortic valve area of 0.73 cm by continuity, aortic stenosis is increased in severity.   Assessment     ICD-10-CM   1. Dyspnea on exertion  R06.00 EKG 12-Lead  2. Moderate aortic stenosis  I35.0 CBC    Basic metabolic panel    TSH    Novel Coronavirus, NAA ([facility])  3. Coronary artery calcification seen on CAT scan  I25.10   4. Bilateral carotid artery stenosis  I65.23   5. Hypercholesteremia  E78.00 atorvastatin (LIPITOR) 10 MG tablet  6. TIA (transient ischemic attack)  G45.9   7. Essential hypertension  I10 propranolol (INDERAL) 40 MG tablet  8. Hyperthyroidism  E05.90 propranolol (INDERAL) 40 MG tablet    EKG 11/23/2018: Marked sinus bradycardia at the rate of 42 bpm, left atrial enlargement, inferior infarct old.  Anteroseptal infarct old.  Nonspecific T abnormality.  Normal QT interval.  EKG 09/12/2018: Sinus tachycardia at rate of 100 bpm, left atrial abnormality, left axis deviation, left anterior fascicular block.  Anteroseptal infarct.  LVH.   Nonspecific ST abnormality, cannot exclude lateral ischemia. No significant change from  EKG 11/20/2017.  CT angiogram chest with contrast 09/12/2018: Cardiovascular: Diffuse aortic atherosclerosis and coronary artery calcifications. Heart is enlarged. Aorta is tortuous, nonaneurysmal. No filling defect. No pulmonary embolism.  Large hiatal hernia.  Small 5 mm nodule.  Recommendations:   Patient seen for worsening shortness of breath and dyspnea on exertion.  I reviewed her recently performed echocardiogram, paradoxically although the gradients the moderate range, she has severe aortic stenosis.  Physical examination is more consistent with moderate aortic stenosis.  However due to symptoms of worsening dyspnea which is unexplained by any other etiology, she has symptomatic moderate to severe aortic stenosis.  Hence she will need aortic valve replacement.  I'll set her up for left and right heart catheterization.  She does have hyperthyroidism and and started her on propranolol and discontinued metoprolol, in view of marked bradycardia, will decrease dose from 40 mg TID to 20 mg TID.  Radioactive iodine scan does not reveal any heart thyroid nodules.  She needs evaluation of this as well.  With regard to carotid artery stenosis, she is only mild stenosis.  She is only on Zetia, but she is willing to try statin, I'll start her on atorvastatin 5 mg daily.  Previously. she was on simvastatin, it was held due to elevated LFTs.  Discussed risks, benefits and alternatives of angiogram including but not limited to <1% risk of death, stroke, MI, need for urgent surgical revascularization, renal failure, but not limited to thest. patient is willing to proceed.   Adrian Prows, MD, [facility] 11/23/2018, 10:31 PM [facility] [place] Pager: [phone] Office: [phone] If no answer Cell [phone]

## 2018-11-23 NOTE — H&P (View-Only) (Signed)
Primary Physician/Referring:  Kelton Pillar, MD  Patient ID: [name] [name], female    DOB: 10/26/1931, 85 y.o.   MRN: [id number]  Chief Complaint  Patient presents with   Acute Visit   Shortness of Breath    med review   HPI:    [name] [name]  is a 85 y.o. Caucasian female with history of benign positional vertigo, chronic dyspnea, chronic back pain, hypertension, mixed hyperlipidemia and mild hyperlipidemia who has had negative nuclear stress test on 04/25/2017 and aortic stenosis presents for f/u of worsening dyspnea.    She is also noticed marked decrease her physical capacity in doing things. There is no PND or orthopnea, no leg edema.  No chest pain, dizziness or syncope.  Past Medical History:  Diagnosis Date   Arthritis    Hyperlipidemia    Hypertension    Lipoma    left arm   Nasal polyps    Past Surgical History:  Procedure Laterality Date   CHOLECYSTECTOMY  02/27/04   INCONTINENCE SURGERY  09/12/09   ORIF ANKLE FRACTURE Right 09/11/2013   Procedure: OPEN REDUCTION INTERNAL FIXATION (ORIF) ANKLE FRACTURE;  Surgeon: Augustin Schooling, MD;  Location: [facility];  Service: Orthopedics;  Laterality: Right;   ROTATOR CUFF REPAIR  09/27/2003   TRIGGER FINGER RELEASE  10/29/06   Social History   Socioeconomic History   Marital status: Married    Spouse name: Not on file   Number of children: 3   Years of education: Not on file   Highest education level: Not on file  Occupational History   Not on file  Social Needs   Financial resource strain: Not on file   Food insecurity    Worry: Not on file    Inability: Not on file   Transportation needs    Medical: Not on file    Non-medical: Not on file  Tobacco Use   Smoking status: Never Smoker   Smokeless tobacco: Never Used  Substance and Sexual Activity   Alcohol use: No   Drug use: No   Sexual activity: Not on file  Lifestyle   Physical activity    Days per week: Not on file    Minutes per  session: Not on file   Stress: Not on file  Relationships   Social connections    Talks on phone: Not on file    Gets together: Not on file    Attends religious service: Not on file    Active member of club or organization: Not on file    Attends meetings of clubs or organizations: Not on file    Relationship status: Not on file   Intimate partner violence    Fear of current or ex partner: Not on file    Emotionally abused: Not on file    Physically abused: Not on file    Forced sexual activity: Not on file  Other Topics Concern   Not on file  Social History Narrative   Not on file   ROS  Review of Systems  Constitution: Negative for chills, decreased appetite, malaise/fatigue and weight gain.  Cardiovascular: Positive for dyspnea on exertion. Negative for leg swelling and syncope.  Respiratory: Positive for wheezing.   Endocrine: Negative for cold intolerance.  Hematologic/Lymphatic: Does not bruise/bleed easily.  Musculoskeletal: Positive for back pain (spinal stenosis) and joint pain (DJD diffuse). Negative for joint swelling.  Gastrointestinal: Negative for abdominal pain, anorexia, change in bowel habit, hematochezia and melena.  Neurological: Negative  for headaches and light-headedness.  Psychiatric/Behavioral: Negative for depression and substance abuse.  All other systems reviewed and are negative.  Objective  Blood pressure 129/67, pulse (!) 48, temperature (!) 95.9 F (35.5 C), height 5' (1.524 m), weight 119 lb (54 kg), SpO2 97 %. Body mass index is 23.24 kg/m.   Physical Exam  Constitutional: She appears well-developed and well-nourished. No distress.  HENT:  Head: Atraumatic.  Eyes: Conjunctivae are normal.  Neck: Neck supple. No JVD present. No thyromegaly present.  Cardiovascular: Normal rate, regular rhythm, S1 normal, S2 normal and intact distal pulses. Exam reveals no gallop.  Murmur heard.  Harsh midsystolic murmur is present with a grade of 3/6  at the upper right sternal border radiating to the neck. Pulses:      Carotid pulses are on the right side with bruit and on the left side with bruit.      Popliteal pulses are 2+ on the right side and 2+ on the left side.       Dorsalis pedis pulses are 2+ on the right side and 2+ on the left side.       Posterior tibial pulses are 2+ on the right side and 2+ on the left side.  Pulmonary/Chest: Effort normal and breath sounds normal.  Abdominal: Soft. Bowel sounds are normal.  Musculoskeletal: Normal range of motion.        General: No edema.  Neurological: She is alert.  Skin: Skin is warm and dry.  Psychiatric: She has a normal mood and affect.   Radiology: No results found. Laboratory examination:   Recent Labs    09/12/18 1327  NA 138  K 3.8  CL 104  CO2 24  GLUCOSE 133*  BUN 16  CREATININE 1.04*  CALCIUM 9.1  GFRNONAA 49*  GFRAA 57*   CMP Latest Ref Rng & Units 09/12/2018 03/30/2016 09/13/2013  Glucose 70 - 99 mg/dL 133(H) 106(H) 112(H)  BUN 8 - 23 mg/dL _0 Creatinine 0.44 - 1.00 mg/dL 1.04(H) 1.24(H) 1.01  Sodium 135 - 145 mmol/L 138 135 141  Potassium 3.5 - 5.1 mmol/L 3.8 3.1(L) 3.3(L)  Chloride 98 - 111 mmol/L 104 96(L) 100  CO2 22 - 32 mmol/L _1 Calcium 8.9 - 10.3 mg/dL 9.1 9.2 9.0  Total Protein 6.5 - 8.1 g/dL 6.7 7.3 -  Total Bilirubin 0.3 - 1.2 mg/dL 1.0 0.5 -  Alkaline Phos 38 - 126 U/L 206(H) 206(H) -  AST 15 - 41 U/L 39 74(H) -  ALT 0 - 44 U/L 31 64(H) -   CBC Latest Ref Rng & Units 09/12/2018 03/30/2016 09/11/2013  WBC 4.0 - 10.5 K/uL 8.0 8.3 9.8  Hemoglobin 12.0 - 15.0 g/dL 10.8(L) 12.5 12.4  Hematocrit 36.0 - 46.0 % 33.6(L) 37.4 37.3  Platelets 150 - 400 K/uL 331 276 243   Lipid Panel   07/25/2017: Hemoglobin A1c 9%. Creatinine 1.16, EGFR 54, potassium 4.5, alkaline loss 420, AST 55, CMP otherwise normal. ( PCP has held Zetia and simvastatin for 2 months due to elevated LFTs). Hemoglobin 11.8.   Cholesterol 157, triglycerides 105, HDL  60, LDL 78.  05/27/2017: Cholesterol 208, triglycerides 84, HDL 81, LDL 110.     Component Value Date/Time   CHOL 194 06/25/2012 0625   TRIG 98 06/25/2012 0625   HDL 56 06/25/2012 0625   CHOLHDL 3.5 06/25/2012 0625   VLDL 20 06/25/2012 0625   LDLCALC 118 (H) 06/25/2012 0625   HEMOGLOBIN A1C Lab Results  Component Value Date  ° HGBA1C 5.6 06/25/2012  ° MPG 114 06/25/2012  ° °TSH °Recent Labs  °  09/12/18 °1640  °TSH 0.015*  ° °Medications  ° °Prior to Admission medications   °Medication Sig Start Date End Date Taking? Authorizing Provider  °alendronate (FOSAMAX) 70 MG tablet  12/23/17  Yes [provider]  °Calcium Carb-Cholecalciferol (CALCIUM 1000 + D PO) Take 1 tablet by mouth daily.   Yes [provider]  °cetirizine (ZYRTEC) 10 MG tablet Take 10 mg by mouth daily.   Yes [provider]  °clopidogrel (PLAVIX) 75 MG tablet Take 1 tablet (75 mg total) by mouth every other day. 06/08/18  Yes Liisa Picone, MD  °ezetimibe (ZETIA) 10 MG tablet Take 10 mg by mouth at bedtime.    Yes [provider]  °meclizine (ANTIVERT) 25 MG tablet Take 25 mg by mouth 3 (three) times daily as needed for dizziness.   Yes [provider]  °metoprolol succinate (TOPROL-XL) 50 MG 24 hr tablet Take 50 mg by mouth daily. 06/11/18  Yes [provider]  °mometasone (NASONEX) 50 MCG/ACT nasal spray Place 2 sprays into the nose daily.   Yes [provider]  °Multiple Vitamin (MULTIVITAMIN WITH MINERALS) TABS tablet Take 1 tablet by mouth daily.   Yes [provider]  °nitrofurantoin, macrocrystal-monohydrate, (MACROBID) 100 MG capsule Take 1 capsule (100 mg total) by mouth 2 (two) times daily. X 7 days 09/12/18  Yes Pfeiffer, Marcy, MD  °benzonatate (TESSALON) 100 MG capsule Take 1-2 capsules (100-200 mg total) by mouth 3 (three) times daily as needed. °Patient not taking: Reported on 09/14/2018 08/23/18   Mani, Mario, PA-C  °  ° °Current Outpatient Medications    °Medication Instructions  °• alendronate (FOSAMAX) 70 MG tablet occasionally  °• atorvastatin (LIPITOR) 5 mg, Oral, Daily  °• benzonatate (TESSALON) 100-200 mg, Oral, 3 times daily PRN  °• Calcium Carb-Cholecalciferol (CALCIUM 1000 + D PO) 1 tablet, Oral, Daily  °• cetirizine (ZYRTEC) 10 mg, Daily  °• clopidogrel (PLAVIX) 75 mg, Oral, Every other day  °• ezetimibe (ZETIA) 10 mg, Oral, Daily at bedtime  °• meclizine (ANTIVERT) 25 mg, Oral, 3 times daily PRN  °• mometasone (NASONEX) 50 MCG/ACT nasal spray 2 sprays, Daily  °• Multiple Vitamin (MULTIVITAMIN WITH MINERALS) TABS tablet 1 tablet, Oral, Daily  °• propranolol (INDERAL) 20 mg, Oral, 3 times daily  ° ° °Cardiac Studies:  ° ° Exercise myoview stress 04/25/2017: °1. The patient performed treadmill exercise using a Bruce protocol, completing 5 minutes. The patient completed an estimated workload of 4.88 METS, reaching 103% of the maximum predicted heart rate. Stress symptoms included dyspnea. Exercise capacity fair for age. Normal hemodynamic response. Possible old anteroseptal infarct. Poor R wave progression. No ischemic changes on stress electrocardiogram. °2. The overall quality of the study is excellent. There is no evidence of abnormal lung activity. Stress and rest SPECT images demonstrate homogeneous tracer distribution throughout the myocardium. Gated SPECT imaging reveals normal myocardial thickening and wall motion. The left ventricular ejection fraction was normal (77%). °3. Low risk study. ° °Carotid artery duplex 11/13/2017: °Minimal stenosis in the right internal carotid artery (1-15%). °Stenosis in the left internal carotid artery (16-49%). °Antegrade right vertebral artery flow. Antegrade left vertebral artery flow. °Follow up in one year is appropriate if clinically indicated. Compared to 05/14/2017, Left carotid stenosis severity decreased from >50%. ° °Echocardiogram 10/01/2018: °Left ventricle cavity is normal in size. Mild concentric  hypertrophy of the left ventricle. Normal   LV systolic function with EF 59%. Normal global wall motion. Doppler evidence of grade II (pseudonormal) diastolic dysfunction, elevated LAP.  Trileaflet aortic valve. Moderate calcification of the aortic valve annulus and leaflets. At least moderate aortic stenosis. Moderate (Grade II) aortic regurgitation. Aortic valve mean gradient of 32 mmHg, Vmax of 3.6  m/s. Calculated aortic valve area by continuity equation is 0.6 cm. Dimensionless index is 0.21. Findings suggest paradoxically low flow low gradient severe aortic stenosis. Consider clinical correlation and further workup. Mild calcification of mitral leaflets. Mild restriction without significant stenosis.  Moderate (Grade II) mitral regurgitation. Mild to moderate tricuspid regurgitation. Estimated pulmonary artery systolic pressure is 35 mmHg.  Compared to the study done on 05/14/2017, peak gradient was 38 and mean gradient was 20 mmHg and aortic valve area of 0.73 cm by continuity, aortic stenosis is increased in severity.   Assessment     ICD-10-CM   1. Dyspnea on exertion  R06.00 EKG 12-Lead  2. Moderate aortic stenosis  I35.0 CBC    Basic metabolic panel    TSH    Novel Coronavirus, NAA ([facility])  3. Coronary artery calcification seen on CAT scan  I25.10   4. Bilateral carotid artery stenosis  I65.23   5. Hypercholesteremia  E78.00 atorvastatin (LIPITOR) 10 MG tablet  6. TIA (transient ischemic attack)  G45.9   7. Essential hypertension  I10 propranolol (INDERAL) 40 MG tablet  8. Hyperthyroidism  E05.90 propranolol (INDERAL) 40 MG tablet    EKG 11/23/2018: Marked sinus bradycardia at the rate of 42 bpm, left atrial enlargement, inferior infarct old.  Anteroseptal infarct old.  Nonspecific T abnormality.  Normal QT interval.  EKG 09/12/2018: Sinus tachycardia at rate of 100 bpm, left atrial abnormality, left axis deviation, left anterior fascicular block.  Anteroseptal infarct.  LVH.   Nonspecific ST abnormality, cannot exclude lateral ischemia. No significant change from  EKG 11/20/2017.  CT angiogram chest with contrast 09/12/2018: Cardiovascular: Diffuse aortic atherosclerosis and coronary artery calcifications. Heart is enlarged. Aorta is tortuous, nonaneurysmal. No filling defect. No pulmonary embolism.  Large hiatal hernia.  Small 5 mm nodule.  Recommendations:   Patient seen for worsening shortness of breath and dyspnea on exertion.  I reviewed her recently performed echocardiogram, paradoxically although the gradients the moderate range, she has severe aortic stenosis.  Physical examination is more consistent with moderate aortic stenosis.  However due to symptoms of worsening dyspnea which is unexplained by any other etiology, she has symptomatic moderate to severe aortic stenosis.  Hence she will need aortic valve replacement.  I'll set her up for left and right heart catheterization.  She does have hyperthyroidism and and started her on propranolol and discontinued metoprolol, in view of marked bradycardia, will decrease dose from 40 mg TID to 20 mg TID.  Radioactive iodine scan does not reveal any heart thyroid nodules.  She needs evaluation of this as well.  With regard to carotid artery stenosis, she is only mild stenosis.  She is only on Zetia, but she is willing to try statin, I'll start her on atorvastatin 5 mg daily.  Previously. she was on simvastatin, it was held due to elevated LFTs.  Discussed risks, benefits and alternatives of angiogram including but not limited to <1% risk of death, stroke, MI, need for urgent surgical revascularization, renal failure, but not limited to thest. patient is willing to proceed.   Adrian Prows, MD, [facility] 11/23/2018, 10:31 PM [facility] [place] Pager: [phone] Office: [phone] If no answer Cell [phone]

## 2018-11-24 ENCOUNTER — Telehealth: Payer: Self-pay

## 2018-11-24 NOTE — Telephone Encounter (Signed)
Pt aware.

## 2018-11-24 NOTE — Telephone Encounter (Signed)
-----   Message from Adrian Prows, MD sent at 11/23/2018 10:32 PM EST ----- Regarding: Medication change She does have hyperthyroidism and and started her on propranolol and discontinued metoprolol, in view of marked bradycardia, will decrease dose from 40 mg TID to 20 mg TID.   Please advise her the change

## 2018-12-11 ENCOUNTER — Other Ambulatory Visit (HOSPITAL_COMMUNITY)
Admission: RE | Admit: 2018-12-11 | Discharge: 2018-12-11 | Disposition: A | Discharge status: Home / Self Care | Payer: Federal, State, Local not specified - PPO | Source: Ambulatory Visit | Attending: Cardiology | Admitting: Cardiology

## 2018-12-11 DIAGNOSIS — Z01812 Encounter for preprocedural laboratory examination: Secondary | ICD-10-CM | POA: Diagnosis present

## 2018-12-11 DIAGNOSIS — Z20828 Contact with and (suspected) exposure to other viral communicable diseases: Secondary | ICD-10-CM | POA: Diagnosis not present

## 2018-12-12 LAB — BASIC METABOLIC PANEL
BUN/Creatinine Ratio: 10 — ABNORMAL LOW (ref 12–28)
BUN: 13 mg/dL (ref 8–27)
CO2: 21 mmol/L (ref 20–29)
Calcium: 9.3 mg/dL (ref 8.7–10.3)
Chloride: 98 mmol/L (ref 96–106)
Creatinine, Ser: 1.36 mg/dL — ABNORMAL HIGH (ref 0.57–1.00)
GFR calc Af Amer: 41 mL/min/{1.73_m2} — ABNORMAL LOW (ref >59)
GFR calc non Af Amer: 36 mL/min/{1.73_m2} — ABNORMAL LOW (ref >59)
Glucose: 107 mg/dL — ABNORMAL HIGH (ref 65–99)
Potassium: 4.4 mmol/L (ref 3.5–5.2)
Sodium: 136 mmol/L (ref 134–144)

## 2018-12-12 LAB — CBC
Hematocrit: 35.7 % (ref 34.0–46.6)
Hemoglobin: 12.3 g/dL (ref 11.1–15.9)
MCH: 30.2 pg (ref 26.6–33.0)
MCHC: 34.5 g/dL (ref 31.5–35.7)
MCV: 88 fL (ref 79–97)
Platelets: 316 10*3/uL (ref 150–450)
RBC: 4.07 x10E6/uL (ref 3.77–5.28)
RDW: 14.9 % (ref 11.7–15.4)
WBC: 9.9 10*3/uL (ref 3.4–10.8)

## 2018-12-12 LAB — TSH: TSH: 94.2 u[IU]/mL — ABNORMAL HIGH (ref 0.450–4.500)

## 2018-12-13 LAB — NOVEL CORONAVIRUS, NAA (HOSP ORDER, SEND-OUT TO REF LAB; TAT 18-24 HRS): SARS-CoV-2, NAA: NOT DETECTED

## 2018-12-14 ENCOUNTER — Telehealth: Payer: Self-pay

## 2018-12-14 DIAGNOSIS — R0609 Other forms of dyspnea: Secondary | ICD-10-CM | POA: Diagnosis present

## 2018-12-14 DIAGNOSIS — R0600 Dyspnea, unspecified: Secondary | ICD-10-CM | POA: Diagnosis present

## 2018-12-14 NOTE — Telephone Encounter (Signed)
Pt called and wants to know what medications does she need to take or not take before heart cath tomorrow ?

## 2018-12-14 NOTE — Telephone Encounter (Signed)
Can you call this pt and let her know what to take and not take please if she still needs to speak to me ill talk to her

## 2018-12-15 ENCOUNTER — Ambulatory Visit (HOSPITAL_COMMUNITY)
Admission: RE | Disposition: A | Discharge status: Home / Self Care | Payer: Self-pay | Source: Home / Self Care | Attending: Cardiology

## 2018-12-15 ENCOUNTER — Ambulatory Visit (HOSPITAL_COMMUNITY)
Admission: RE | Admit: 2018-12-15 | Discharge: 2018-12-15 | Disposition: A | Discharge status: Home / Self Care | Payer: Federal, State, Local not specified - PPO | Attending: Cardiology | Admitting: Cardiology

## 2018-12-15 ENCOUNTER — Other Ambulatory Visit: Payer: Self-pay

## 2018-12-15 DIAGNOSIS — I2510 Atherosclerotic heart disease of native coronary artery without angina pectoris: Secondary | ICD-10-CM | POA: Insufficient documentation

## 2018-12-15 DIAGNOSIS — I10 Essential (primary) hypertension: Secondary | ICD-10-CM | POA: Insufficient documentation

## 2018-12-15 DIAGNOSIS — R0602 Shortness of breath: Secondary | ICD-10-CM | POA: Diagnosis present

## 2018-12-15 DIAGNOSIS — Z7902 Long term (current) use of antithrombotics/antiplatelets: Secondary | ICD-10-CM | POA: Insufficient documentation

## 2018-12-15 DIAGNOSIS — I25118 Atherosclerotic heart disease of native coronary artery with other forms of angina pectoris: Secondary | ICD-10-CM | POA: Diagnosis not present

## 2018-12-15 DIAGNOSIS — R0609 Other forms of dyspnea: Secondary | ICD-10-CM | POA: Diagnosis present

## 2018-12-15 DIAGNOSIS — Z8673 Personal history of transient ischemic attack (TIA), and cerebral infarction without residual deficits: Secondary | ICD-10-CM | POA: Insufficient documentation

## 2018-12-15 DIAGNOSIS — Z79899 Other long term (current) drug therapy: Secondary | ICD-10-CM | POA: Diagnosis not present

## 2018-12-15 DIAGNOSIS — I272 Pulmonary hypertension, unspecified: Secondary | ICD-10-CM | POA: Diagnosis not present

## 2018-12-15 DIAGNOSIS — I6523 Occlusion and stenosis of bilateral carotid arteries: Secondary | ICD-10-CM | POA: Insufficient documentation

## 2018-12-15 DIAGNOSIS — E0590 Thyrotoxicosis, unspecified without thyrotoxic crisis or storm: Secondary | ICD-10-CM | POA: Insufficient documentation

## 2018-12-15 DIAGNOSIS — E785 Hyperlipidemia, unspecified: Secondary | ICD-10-CM | POA: Diagnosis not present

## 2018-12-15 DIAGNOSIS — I350 Nonrheumatic aortic (valve) stenosis: Secondary | ICD-10-CM | POA: Diagnosis not present

## 2018-12-15 DIAGNOSIS — R0600 Dyspnea, unspecified: Secondary | ICD-10-CM | POA: Diagnosis present

## 2018-12-15 HISTORY — PX: RIGHT/LEFT HEART CATH AND CORONARY ANGIOGRAPHY: CATH118266

## 2018-12-15 LAB — POCT I-STAT EG7
Acid-base deficit: 3 mmol/L — ABNORMAL HIGH (ref 0.0–2.0)
Acid-base deficit: 4 mmol/L — ABNORMAL HIGH (ref 0.0–2.0)
Bicarbonate: 22.6 mmol/L (ref 20.0–28.0)
Bicarbonate: 22.9 mmol/L (ref 20.0–28.0)
Calcium, Ion: 1.16 mmol/L (ref 1.15–1.40)
Calcium, Ion: 1.17 mmol/L (ref 1.15–1.40)
HCT: 29 % — ABNORMAL LOW (ref 36.0–46.0)
HCT: 29 % — ABNORMAL LOW (ref 36.0–46.0)
Hemoglobin: 9.9 g/dL — ABNORMAL LOW (ref 12.0–15.0)
Hemoglobin: 9.9 g/dL — ABNORMAL LOW (ref 12.0–15.0)
O2 Saturation: 66 %
O2 Saturation: 68 %
Potassium: 3.7 mmol/L (ref 3.5–5.1)
Potassium: 3.7 mmol/L (ref 3.5–5.1)
Sodium: 137 mmol/L (ref 135–145)
Sodium: 138 mmol/L (ref 135–145)
TCO2: 24 mmol/L (ref 22–32)
TCO2: 24 mmol/L (ref 22–32)
pCO2, Ven: 44.1 mmHg (ref 44.0–60.0)
pCO2, Ven: 44.6 mmHg (ref 44.0–60.0)
pH, Ven: 7.313 (ref 7.250–7.430)
pH, Ven: 7.324 (ref 7.250–7.430)
pO2, Ven: 37 mmHg (ref 32.0–45.0)
pO2, Ven: 38 mmHg (ref 32.0–45.0)

## 2018-12-15 LAB — POCT I-STAT 7, (LYTES, BLD GAS, ICA,H+H)
Acid-base deficit: 3 mmol/L — ABNORMAL HIGH (ref 0.0–2.0)
Bicarbonate: 22.5 mmol/L (ref 20.0–28.0)
Calcium, Ion: 1.15 mmol/L (ref 1.15–1.40)
HCT: 30 % — ABNORMAL LOW (ref 36.0–46.0)
Hemoglobin: 10.2 g/dL — ABNORMAL LOW (ref 12.0–15.0)
O2 Saturation: 95 %
Potassium: 3.7 mmol/L (ref 3.5–5.1)
Sodium: 137 mmol/L (ref 135–145)
TCO2: 24 mmol/L (ref 22–32)
pCO2 arterial: 40.7 mmHg (ref 32.0–48.0)
pH, Arterial: 7.351 (ref 7.350–7.450)
pO2, Arterial: 81 mmHg — ABNORMAL LOW (ref 83.0–108.0)

## 2018-12-15 SURGERY — RIGHT/LEFT HEART CATH AND CORONARY ANGIOGRAPHY
Anesthesia: LOCAL

## 2018-12-15 MED ORDER — SODIUM CHLORIDE 0.9% FLUSH: 3.0000 mL | INTRAVENOUS | Status: DC | PRN

## 2018-12-15 MED ORDER — CLOPIDOGREL BISULFATE 75 MG PO TABS
75.0000 mg | ORAL_TABLET | Freq: Once | ORAL | Status: AC
  Administered 2018-12-15: 75 mg via ORAL
  Filled 2018-12-15: qty 1

## 2018-12-15 MED ORDER — SODIUM CHLORIDE 0.9 % WEIGHT BASED INFUSION
3.0000 mL/kg/h | INTRAVENOUS | Status: AC
  Administered 2018-12-15: 999 mL/h via INTRAVENOUS

## 2018-12-15 MED ORDER — SODIUM CHLORIDE 0.9 % WEIGHT BASED INFUSION: 1.0000 mL/kg/h | INTRAVENOUS | Status: DC

## 2018-12-15 MED ORDER — IOHEXOL 350 MG/ML SOLN
INTRAVENOUS | Status: DC | PRN
  Administered 2018-12-15: 30 mL

## 2018-12-15 MED ORDER — MIDAZOLAM HCL 2 MG/2ML IJ SOLN
INTRAMUSCULAR | Status: DC | PRN
  Administered 2018-12-15: 1 mg via INTRAVENOUS

## 2018-12-15 MED ORDER — LIDOCAINE HCL (PF) 1 % IJ SOLN
INTRAMUSCULAR | Status: DC | PRN
  Administered 2018-12-15 (×2): 2 mL

## 2018-12-15 MED ORDER — HEPARIN (PORCINE) IN NACL 1000-0.9 UT/500ML-% IV SOLN
INTRAVENOUS | Status: AC
  Filled 2018-12-15: qty 1000

## 2018-12-15 MED ORDER — LIDOCAINE HCL (PF) 1 % IJ SOLN
INTRAMUSCULAR | Status: AC
  Filled 2018-12-15: qty 30

## 2018-12-15 MED ORDER — FENTANYL CITRATE (PF) 100 MCG/2ML IJ SOLN
INTRAMUSCULAR | Status: AC
  Filled 2018-12-15: qty 2

## 2018-12-15 MED ORDER — VERAPAMIL HCL 2.5 MG/ML IV SOLN
INTRAVENOUS | Status: DC | PRN
  Administered 2018-12-15: 4 mL via INTRA_ARTERIAL

## 2018-12-15 MED ORDER — HEPARIN SODIUM (PORCINE) 1000 UNIT/ML IJ SOLN
INTRAMUSCULAR | Status: DC | PRN
  Administered 2018-12-15: 3000 [IU] via INTRAVENOUS

## 2018-12-15 MED ORDER — HEPARIN (PORCINE) IN NACL 1000-0.9 UT/500ML-% IV SOLN
INTRAVENOUS | Status: DC | PRN
  Administered 2018-12-15 (×2): 500 mL

## 2018-12-15 MED ORDER — SODIUM CHLORIDE 0.9% FLUSH: 3.0000 mL | Freq: Two times a day (BID) | INTRAVENOUS | Status: DC

## 2018-12-15 MED ORDER — FENTANYL CITRATE (PF) 100 MCG/2ML IJ SOLN
INTRAMUSCULAR | Status: DC | PRN
  Administered 2018-12-15: 25 ug via INTRAVENOUS

## 2018-12-15 MED ORDER — SODIUM CHLORIDE 0.9 % IV SOLN: 250.0000 mL | INTRAVENOUS | Status: DC | PRN

## 2018-12-15 MED ORDER — HEPARIN (PORCINE) IN NACL 1000-0.9 UT/500ML-% IV SOLN
INTRAVENOUS | Status: AC
  Filled 2018-12-15: qty 500

## 2018-12-15 MED ORDER — VERAPAMIL HCL 2.5 MG/ML IV SOLN
INTRAVENOUS | Status: AC
  Filled 2018-12-15: qty 2

## 2018-12-15 MED ORDER — MIDAZOLAM HCL 2 MG/2ML IJ SOLN
INTRAMUSCULAR | Status: AC
  Filled 2018-12-15: qty 2

## 2018-12-15 MED ORDER — HEPARIN SODIUM (PORCINE) 1000 UNIT/ML IJ SOLN
INTRAMUSCULAR | Status: AC
  Filled 2018-12-15: qty 1

## 2018-12-15 SURGICAL SUPPLY — 14 items
CATH BALLN WEDGE 5F 110CM (CATHETERS) ×2 IMPLANT
CATH OPTITORQUE TIG 4.0 5F (CATHETERS) ×2 IMPLANT
DEVICE RAD COMP TR BAND LRG (VASCULAR PRODUCTS) ×2 IMPLANT
GLIDESHEATH SLEND A-KIT 6F 22G (SHEATH) ×2 IMPLANT
GUIDEWIRE .025 260CM (WIRE) ×2 IMPLANT
GUIDEWIRE ANGLED .035X150CM (WIRE) ×2 IMPLANT
GUIDEWIRE INQWIRE 1.5J.035X260 (WIRE) ×1 IMPLANT
INQWIRE 1.5J .035X260CM (WIRE) ×2
KIT HEART LEFT (KITS) ×2 IMPLANT
PACK CARDIAC CATHETERIZATION (CUSTOM PROCEDURE TRAY) ×2 IMPLANT
SHEATH GLIDE SLENDER 4/5FR (SHEATH) ×2 IMPLANT
TRANSDUCER W/STOPCOCK (MISCELLANEOUS) ×2 IMPLANT
TUBING CIL FLEX 10 FLL-RA (TUBING) ×4 IMPLANT
WIRE HI TORQ VERSACORE-J 145CM (WIRE) ×2 IMPLANT

## 2018-12-15 NOTE — Discharge Instructions (Signed)
Radial Site Care ° °This sheet gives you information about how to care for yourself after your procedure. Your health care provider may also give you more specific instructions. If you have problems or questions, contact your health care provider. °What can I expect after the procedure? °After the procedure, it is common to have: °· Bruising and tenderness at the catheter insertion area. °Follow these instructions at home: °Medicines °· Take over-the-counter and prescription medicines only as told by your health care provider. °Insertion site care °· Follow instructions from your health care provider about how to take care of your insertion site. Make sure you: °? Wash your hands with soap and water before you change your bandage (dressing). If soap and water are not available, use hand sanitizer. °? Change your dressing as told by your health care provider. °? Leave stitches (sutures), skin glue, or adhesive strips in place. These skin closures may need to stay in place for 2 weeks or longer. If adhesive strip edges start to loosen and curl up, you may trim the loose edges. Do not remove adhesive strips completely unless your health care provider tells you to do that. °· Check your insertion site every day for signs of infection. Check for: °? Redness, swelling, or pain. °? Fluid or blood. °? Pus or a bad smell. °? Warmth. °· Do not take baths, swim, or use a hot tub until your health care provider approves. °· You may shower 24-48 hours after the procedure, or as directed by your health care provider. °? Remove the dressing and gently wash the site with plain soap and water. °? Pat the area dry with a clean towel. °? Do not rub the site. That could cause bleeding. °· Do not apply powder or lotion to the site. °Activity ° °· For 24 hours after the procedure, or as directed by your health care provider: °? Do not flex or bend the affected arm. °? Do not push or pull heavy objects with the affected arm. °? Do not  drive yourself home from the hospital or clinic. You may drive 24 hours after the procedure unless your health care provider tells you not to. °? Do not operate machinery or power tools. °· Do not lift anything that is heavier than 10 lb (4.5 kg), or the limit that you are told, until your health care provider says that it is safe. °· Ask your health care provider when it is okay to: °? Return to work or school. °? Resume usual physical activities or sports. °? Resume sexual activity. °General instructions °· If the catheter site starts to bleed, raise your arm and put firm pressure on the site. If the bleeding does not stop, get help right away. This is a medical emergency. °· If you went home on the same day as your procedure, a responsible adult should be with you for the first 24 hours after you arrive home. °· Keep all follow-up visits as told by your health care provider. This is important. °Contact a health care provider if: °· You have a fever. °· You have redness, swelling, or yellow drainage around your insertion site. °Get help right away if: °· You have unusual pain at the radial site. °· The catheter insertion area swells very fast. °· The insertion area is bleeding, and the bleeding does not stop when you hold steady pressure on the area. °· Your arm or hand becomes pale, cool, tingly, or numb. °These symptoms may represent a serious problem   that is an emergency. Do not wait to see if the symptoms will go away. Get medical help right away. Call your local emergency services (911 in the [place] Do not drive yourself to the hospital. °Summary °· After the procedure, it is common to have bruising and tenderness at the site. °· Follow instructions from your health care provider about how to take care of your radial site wound. Check the wound every day for signs of infection. °· Do not lift anything that is heavier than 10 lb (4.5 kg), or the limit that you are told, until your health care provider says  that it is safe. °This information is not intended to replace advice given to you by your health care provider. Make sure you discuss any questions you have with your health care provider. °Document Released: 02/09/2010 Document Revised: 02/12/2017 Document Reviewed: 02/12/2017 °Elsevier Patient Education © 2020 [employer] ° °

## 2018-12-15 NOTE — Interval H&P Note (Signed)
History and Physical Interval Note:  12/15/2018 10:44 AM  [name]  has presented today for surgery, with the diagnosis of shortness of breath.  The various methods of treatment have been discussed with the patient and family. After consideration of risks, benefits and other options for treatment, the patient has consented to  Procedure(s): RIGHT/LEFT HEART CATH AND CORONARY ANGIOGRAPHY (N/A) and possible angioplasty as a surgical intervention.  The patient's history has been reviewed, patient examined, no change in status, stable for surgery.  I have reviewed the patient's chart and labs.  Questions were answered to the patient's satisfaction.     Adrian Prows

## 2018-12-16 ENCOUNTER — Encounter (HOSPITAL_COMMUNITY): Payer: Self-pay | Admitting: Cardiology

## 2018-12-16 MED FILL — Heparin Sod (Porcine)-NaCl IV Soln 1000 Unit/500ML-0.9%: INTRAVENOUS | Qty: 500 | Status: AC

## 2018-12-22 NOTE — Progress Notes (Signed)
Primary Physician/Referring:  Kelton Pillar, MD  Patient ID: [name] [name], female    DOB: Dec 16, 1931, 85 y.o.   MRN: [id number]  Chief Complaint  Patient presents with  . Aortic Stenosis    post hospital  . Follow-up   HPI:    [name] [name]  is a 85 y.o. Caucasian female with history of benign positional vertigo, chronic dyspnea, chronic back pain, hypertension, mixed hyperlipidemia and  Symptomatic aortic stenosis with worsening dyspnea, underwent right and left heart catheterization on 12/15/2018 with findings consistent with severe aortic valve stenosis.  She was found to have 80% in the proximal LAD, large D1 with ostial 90% stenosis, medium size RI with a proximal 90% stenosis and mild circumflex and RCA disease.  She was referred to Dr. Tiana Loft for evaluation for CABG and AVR.  She may also be a candidate for TAVR, in which we could consider medical management of CAD or PCI to proximal LAD and TAVR.  She now presents for post-cath follow-up.  Symptoms remain unchanged since last evaluated by me.  She continues to have marked dyspnea with previously well tolerated activities.  No complications from right radial access site. No new symptoms, daughter present at bedside.  Past Medical History:  Diagnosis Date  . Arthritis   . Hyperlipidemia   . Hypertension   . Lipoma    left arm  . Nasal polyps    Past Surgical History:  Procedure Laterality Date  . CHOLECYSTECTOMY  02/27/04  . INCONTINENCE SURGERY  09/12/09  . ORIF ANKLE FRACTURE Right 09/11/2013   Procedure: OPEN REDUCTION INTERNAL FIXATION (ORIF) ANKLE FRACTURE;  Surgeon: Augustin Schooling, MD;  Location: [facility];  Service: Orthopedics;  Laterality: Right;  . RIGHT/LEFT HEART CATH AND CORONARY ANGIOGRAPHY N/A 12/15/2018   Procedure: RIGHT/LEFT HEART CATH AND CORONARY ANGIOGRAPHY;  Surgeon: Adrian Prows, MD;  Location: [facility];  Service: Cardiovascular;  Laterality: N/A;  . ROTATOR CUFF REPAIR  09/27/2003  . TRIGGER  FINGER RELEASE  10/29/06   Social History   Socioeconomic History  . Marital status: Married    Spouse name: Not on file  . Number of children: 3  . Years of education: Not on file  . Highest education level: Not on file  Occupational History  . Not on file  Social Needs  . Financial resource strain: Not on file  . Food insecurity    Worry: Not on file    Inability: Not on file  . Transportation needs    Medical: Not on file    Non-medical: Not on file  Tobacco Use  . Smoking status: Never Smoker  . Smokeless tobacco: Never Used  Substance and Sexual Activity  . Alcohol use: No  . Drug use: No  . Sexual activity: Not on file  Lifestyle  . Physical activity    Days per week: Not on file    Minutes per session: Not on file  . Stress: Not on file  Relationships  . Social Herbalist on phone: Not on file    Gets together: Not on file    Attends religious service: Not on file    Active member of club or organization: Not on file    Attends meetings of clubs or organizations: Not on file    Relationship status: Not on file  . Intimate partner violence    Fear of current or ex partner: Not on file    Emotionally abused: Not on  file    Physically abused: Not on file    Forced sexual activity: Not on file  Other Topics Concern  . Not on file  Social History Narrative  . Not on file   ROS  Review of Systems  Constitution: Negative for chills, decreased appetite, malaise/fatigue and weight gain.  Cardiovascular: Positive for dyspnea on exertion. Negative for leg swelling and syncope.  Respiratory: Positive for wheezing.   Endocrine: Negative for cold intolerance.  Hematologic/Lymphatic: Does not bruise/bleed easily.  Musculoskeletal: Positive for back pain (spinal stenosis) and joint pain (DJD diffuse). Negative for joint swelling.  Gastrointestinal: Negative for abdominal pain, anorexia, change in bowel habit, hematochezia and melena.  Neurological: Negative  for headaches and light-headedness.  Psychiatric/Behavioral: Negative for depression and substance abuse.  All other systems reviewed and are negative.  Objective  Blood pressure 138/70, pulse 73, temperature (!) 96.1 F (35.6 C), height 5' (1.524 m), weight 124 lb 3.2 oz (56.3 kg), SpO2 97 %. Body mass index is 24.26 kg/m.   Physical Exam  Constitutional: She appears well-developed and well-nourished. No distress.  HENT:  Head: Atraumatic.  Eyes: Conjunctivae are normal.  Neck: Neck supple. No JVD present. No thyromegaly present.  Cardiovascular: Normal rate, regular rhythm, S1 normal, S2 normal and intact distal pulses. Exam reveals no gallop.  Murmur heard.  Harsh midsystolic murmur is present with a grade of 3/6 at the upper right sternal border radiating to the neck. Pulses:      Carotid pulses are on the right side with bruit and on the left side with bruit.      Popliteal pulses are 2+ on the right side and 2+ on the left side.       Dorsalis pedis pulses are 2+ on the right side and 2+ on the left side.       Posterior tibial pulses are 2+ on the right side and 2+ on the left side.  Pulmonary/Chest: Effort normal and breath sounds normal.  Abdominal: Soft. Bowel sounds are normal.  Musculoskeletal: Normal range of motion.        General: No edema.  Neurological: She is alert.  Skin: Skin is warm and dry.  Psychiatric: She has a normal mood and affect.   Radiology: No results found. Laboratory examination:   Recent Labs    09/12/18 1327 12/11/18 1027 12/15/18 1102 12/15/18 1128  NA 138 136 138  137 137  K 3.8 4.4 3.7  3.7 3.7  CL 104 98  --   --   CO2 24 21  --   --   GLUCOSE 133* 107*  --   --   BUN 16 13  --   --   CREATININE 1.04* 1.36*  --   --   CALCIUM 9.1 9.3  --   --   GFRNONAA 49* 36*  --   --   GFRAA 57* 41*  --   --    CMP Latest Ref Rng & Units 12/15/2018 12/15/2018 12/15/2018  Glucose 65 - 99 mg/dL - - -  BUN 8 - 27 mg/dL - - -  Creatinine  0.57 - 1.00 mg/dL - - -  Sodium 135 - 145 mmol/L 137 138 137  Potassium 3.5 - 5.1 mmol/L 3.7 3.7 3.7  Chloride 96 - 106 mmol/L - - -  CO2 20 - 29 mmol/L - - -  Calcium 8.7 - 10.3 mg/dL - - -  Total Protein 6.5 - 8.1 g/dL - - -  Total Bilirubin  0.3 - 1.2 mg/dL - - -  Alkaline Phos 38 - 126 U/L - - -  AST 15 - 41 U/L - - -  ALT 0 - 44 U/L - - -   CBC Latest Ref Rng & Units 12/15/2018 12/15/2018 12/15/2018  WBC 3.4 - 10.8 x10E3/uL - - -  Hemoglobin 12.0 - 15.0 g/dL 10.2(L) 9.9(L) 9.9(L)  Hematocrit 36.0 - 46.0 % 30.0(L) 29.0(L) 29.0(L)  Platelets 150 - 450 x10E3/uL - - -   Lipid Panel   07/25/2017: Hemoglobin A1c 9%. Creatinine 1.16, EGFR 54, potassium 4.5, alkaline loss 420, AST 55, CMP otherwise normal. ( PCP has held Zetia and simvastatin for 2 months due to elevated LFTs). Hemoglobin 11.8.   Cholesterol 157, triglycerides 105, HDL 60, LDL 78.  05/27/2017: Cholesterol 208, triglycerides 84, HDL 81, LDL 110.     Component Value Date/Time   CHOL 194 06/25/2012 0625   TRIG 98 06/25/2012 0625   HDL 56 06/25/2012 0625   CHOLHDL 3.5 06/25/2012 0625   VLDL 20 06/25/2012 0625   LDLCALC 118 (H) 06/25/2012 0625   HEMOGLOBIN A1C Lab Results  Component Value Date   HGBA1C 5.6 06/25/2012   MPG 114 06/25/2012   TSH Recent Labs    09/12/18 1640 12/11/18 1027  TSH 0.015* 94.200*   Medications   Prior to Admission medications   Medication Sig Start Date End Date Taking? Authorizing Provider  alendronate (FOSAMAX) 70 MG tablet  12/23/17  Yes [provider]  Calcium Carb-Cholecalciferol (CALCIUM 1000 + D PO) Take 1 tablet by mouth daily.   Yes [provider]  cetirizine (ZYRTEC) 10 MG tablet Take 10 mg by mouth daily.   Yes [provider]  clopidogrel (PLAVIX) 75 MG tablet Take 1 tablet (75 mg total) by mouth every other day. 06/08/18  Yes Adrian Prows, MD  ezetimibe (ZETIA) 10 MG tablet Take 10 mg by mouth at bedtime.    Yes [provider]   meclizine (ANTIVERT) 25 MG tablet Take 25 mg by mouth 3 (three) times daily as needed for dizziness.   Yes [provider]  metoprolol succinate (TOPROL-XL) 50 MG 24 hr tablet Take 50 mg by mouth daily. 06/11/18  Yes [provider]  mometasone (NASONEX) 50 MCG/ACT nasal spray Place 2 sprays into the nose daily.   Yes [provider]  Multiple Vitamin (MULTIVITAMIN WITH MINERALS) TABS tablet Take 1 tablet by mouth daily.   Yes [provider]  nitrofurantoin, macrocrystal-monohydrate, (MACROBID) 100 MG capsule Take 1 capsule (100 mg total) by mouth 2 (two) times daily. X 7 days 09/12/18  Yes Pfeiffer, Jeannie Done, MD  benzonatate (TESSALON) 100 MG capsule Take 1-2 capsules (100-200 mg total) by mouth 3 (three) times daily as needed. Patient not taking: Reported on 09/14/2018 08/23/18   Jaynee Eagles, PA-C     Current Outpatient Medications  Medication Instructions  . atorvastatin (LIPITOR) 5 mg, Oral, Daily  . benzonatate (TESSALON) 100-200 mg, Oral, 3 times daily PRN  . cetirizine (ZYRTEC) 10 mg, Daily  . cholecalciferol (VITAMIN D) 1,000 Units, Oral, Daily  . clopidogrel (PLAVIX) 75 mg, Oral, Every other day  . ezetimibe (ZETIA) 10 mg, Oral, Daily  . levothyroxine (SYNTHROID) 50 mcg, Oral, Daily before breakfast  . meclizine (ANTIVERT) 25 mg, Oral, 3 times daily PRN  . mometasone (NASONEX) 50 MCG/ACT nasal spray 2 sprays, Nasal, Every evening  . Multiple Vitamin (MULTIVITAMIN WITH MINERALS) TABS tablet 1 tablet, Oral, 4 times weekly  . Polyethyl Glycol-Propyl Glycol (  SYSTANE) 0.4-0.3 % SOLN 1 drop, Both Eyes, 3 times daily PRN  . propranolol (INDERAL) 20 mg, Oral, 3 times daily  . sodium chloride (OCEAN) 0.65 % SOLN nasal spray 1 spray, Each Nare, Daily    Cardiac Studies:    Exercise myoview stress 04/25/2017: 1. The patient performed treadmill exercise using a Bruce protocol, completing 5 minutes. The patient completed an estimated workload of 4.88 METS,  reaching 103% of the maximum predicted heart rate. Stress symptoms included dyspnea. Exercise capacity fair for age. Normal hemodynamic response. Possible old anteroseptal infarct. Poor R wave progression. No ischemic changes on stress electrocardiogram. 2. The overall quality of the study is excellent. There is no evidence of abnormal lung activity. Stress and rest SPECT images demonstrate homogeneous tracer distribution throughout the myocardium. Gated SPECT imaging reveals normal myocardial thickening and wall motion. The left ventricular ejection fraction was normal (77%). 3. Low risk study.  Carotid artery duplex 11/13/2017: Minimal stenosis in the right internal carotid artery (1-15%). Stenosis in the left internal carotid artery (16-49%). Antegrade right vertebral artery flow. Antegrade left vertebral artery flow. Follow up in one year is appropriate if clinically indicated. Compared to 05/14/2017, Left carotid stenosis severity decreased from >50%.  Echocardiogram 10/01/2018: Left ventricle cavity is normal in size. Mild concentric hypertrophy of the left ventricle. Normal LV systolic function with EF 59%. Normal global wall motion. Doppler evidence of grade II (pseudonormal) diastolic dysfunction, elevated LAP.  Trileaflet aortic valve. Moderate calcification of the aortic valve annulus and leaflets. At least moderate aortic stenosis. Moderate (Grade II) aortic regurgitation. Aortic valve mean gradient of 32 mmHg, Vmax of 3.6  m/s. Calculated aortic valve area by continuity equation is 0.6 cm. Dimensionless index is 0.21. Findings suggest paradoxically low flow low gradient severe aortic stenosis. Consider clinical correlation and further workup. Mild calcification of mitral leaflets. Mild restriction without significant stenosis.  Moderate (Grade II) mitral regurgitation. Mild to moderate tricuspid regurgitation. Estimated pulmonary artery systolic pressure is 35 mmHg.  Compared to the  study done on 05/14/2017, peak gradient was 38 and mean gradient was 20 mmHg and aortic valve area of 0.73 cm by continuity, aortic stenosis is increased in severity.   Right and left heart catheterization 12/15/2018: RA 5/5, mean 5; RV 31/3, EDP 6; PA 32/14, mean 21, PA saturation 67%; PW 11/10, mean 10 mmHg.  CO 3.84, CI  2.5756. QP/QS 1.0.  Normal right heart catheterization. Mildly elevated LVEDP at 15 mmHg.  Peak aortic valve gradient of 27, mean 23.6 mmHg with calculated aortic valve area 0.76 cm.  Findings consistent with severe calcific degenerative aortic valve stenosis. Left dominant circulation, proximal LAD 80% stenosis, large D1 with ostial 90% stenosis, medium sized RI with a proximal 90% stenosis, dominant circumflex mild disease.  RCA mild disease and nondominant.  Recommendation: Patient will need to be evaluated for CABG and aortic valve replacement.  If the TAVR team feels she is a good candidate for TAVR, we could certainly consider either medical management of CAD or PCI to proximal LAD and TAVR. Patient be discharged home today with outpatient follow-up.  Patient has been referred to be evaluated by Dr. Gilford Raid. 30 mL contrast utilized.  Assessment     ICD-10-CM   1. Severe calcific aortic stenosis  I35.0 EKG 12-Lead  2. Atherosclerosis of native coronary artery of native heart with stable angina pectoris ([place])  I25.118 EKG 12-Lead  3. Dyspnea on exertion  R06.00     EKG 12/23/2018: Normal sinus rhythm  at rate of 72 beats minute, biatrial enlargement, left axis deviation, left anterior fascicular block.  Anteroseptal infarct old.  Nonspecific T abnormality.  IVCD, LVH. Compared to 11/23/2018, marked sinus bradycardia at the rate of 42 bpm not present.    Recommendations:   URIAH TRUEBA  is a 85 y.o. Caucasian female with history of benign positional vertigo, chronic dyspnea, chronic back pain, hypertension, mixed hyperlipidemia and  Symptomatic aortic stenosis with  worsening dyspnea, underwent right and left heart catheterization on 12/15/2018 with findings consistent with severe aortic valve stenosis.  She was found to have 80% in the proximal LAD, large D1 with ostial 90% stenosis, medium size RI with a proximal 90% stenosis and mild circumflex and RCA disease.  She was referred to Dr. Gilford Raid for evaluation for CABG and AVR vs TAVR vs PCI and TAVR in view of her advanced age.  I have advised her that TAVR team will reassess her situation and make final determination.  For now coronary CTA has been ordered by Dr. Cyndia Bent as well.  Advised her to keep all appointments until further additions made.  No changes in the medications were done today. Since reducing BB dose, HR has improved.   Her options as dictated above very again discussed with the patient and her daughter.  She is 13 years of age but also can occur for husband who is presently 59 years of age.  She does have hyperthyroidism and she is now hypothyroid per recent labs in Nov, TSH was around 95. Now on low dose synthyroid.  Radioactive iodine scan does not reveal any heart thyroid nodules.  She needs evaluation of this as well.  She is on low dose statins due to intolerance for higher dose with abnormal LFT and myalgia.    Adrian Prows, MD, [facility] 12/23/2018, 8:21 PM [facility]. [place] Pager: [phone] Office: [phone] If no answer Cell [phone]

## 2018-12-22 NOTE — H&P (View-Only) (Signed)
Primary Physician/Referring:  Kelton Pillar, MD  Patient ID: [name] [name], female    DOB: [birth year], 85 y.o.   MRN: [id number]  Chief Complaint  Patient presents with  . Aortic Stenosis    post hospital  . Follow-up   HPI:    [name] [name]  is a 85 y.o. Caucasian female with history of benign positional vertigo, chronic dyspnea, chronic back pain, hypertension, mixed hyperlipidemia and  Symptomatic aortic stenosis with worsening dyspnea, underwent right and left heart catheterization on 12/15/2018 with findings consistent with severe aortic valve stenosis.  She was found to have 80% in the proximal LAD, large D1 with ostial 90% stenosis, medium size RI with a proximal 90% stenosis and mild circumflex and RCA disease.  She was referred to Dr. Tiana Loft for evaluation for CABG and AVR.  She may also be a candidate for TAVR, in which we could consider medical management of CAD or PCI to proximal LAD and TAVR.  She now presents for post-cath follow-up.  Symptoms remain unchanged since last evaluated by me.  She continues to have marked dyspnea with previously well tolerated activities.  No complications from right radial access site. No new symptoms, daughter present at bedside.  Past Medical History:  Diagnosis Date  . Arthritis   . Hyperlipidemia   . Hypertension   . Lipoma    left arm  . Nasal polyps    Past Surgical History:  Procedure Laterality Date  . CHOLECYSTECTOMY  02/27/04  . INCONTINENCE SURGERY  09/12/09  . ORIF ANKLE FRACTURE Right 09/11/2013   Procedure: OPEN REDUCTION INTERNAL FIXATION (ORIF) ANKLE FRACTURE;  Surgeon: Augustin Schooling, MD;  Location: [facility];  Service: Orthopedics;  Laterality: Right;  . RIGHT/LEFT HEART CATH AND CORONARY ANGIOGRAPHY N/A 12/15/2018   Procedure: RIGHT/LEFT HEART CATH AND CORONARY ANGIOGRAPHY;  Surgeon: Adrian Prows, MD;  Location: [facility];  Service: Cardiovascular;  Laterality: N/A;  . ROTATOR CUFF REPAIR  09/27/2003  . TRIGGER  FINGER RELEASE  10/29/06   Social History   Socioeconomic History  . Marital status: Married    Spouse name: Not on file  . Number of children: 3  . Years of education: Not on file  . Highest education level: Not on file  Occupational History  . Not on file  Social Needs  . Financial resource strain: Not on file  . Food insecurity    Worry: Not on file    Inability: Not on file  . Transportation needs    Medical: Not on file    Non-medical: Not on file  Tobacco Use  . Smoking status: Never Smoker  . Smokeless tobacco: Never Used  Substance and Sexual Activity  . Alcohol use: No  . Drug use: No  . Sexual activity: Not on file  Lifestyle  . Physical activity    Days per week: Not on file    Minutes per session: Not on file  . Stress: Not on file  Relationships  . Social Herbalist on phone: Not on file    Gets together: Not on file    Attends religious service: Not on file    Active member of club or organization: Not on file    Attends meetings of clubs or organizations: Not on file    Relationship status: Not on file  . Intimate partner violence    Fear of current or ex partner: Not on file    Emotionally abused: Not on  file    Physically abused: Not on file    Forced sexual activity: Not on file  Other Topics Concern  . Not on file  Social History Narrative  . Not on file   ROS  Review of Systems  Constitution: Negative for chills, decreased appetite, malaise/fatigue and weight gain.  Cardiovascular: Positive for dyspnea on exertion. Negative for leg swelling and syncope.  Respiratory: Positive for wheezing.   Endocrine: Negative for cold intolerance.  Hematologic/Lymphatic: Does not bruise/bleed easily.  Musculoskeletal: Positive for back pain (spinal stenosis) and joint pain (DJD diffuse). Negative for joint swelling.  Gastrointestinal: Negative for abdominal pain, anorexia, change in bowel habit, hematochezia and melena.  Neurological: Negative  for headaches and light-headedness.  Psychiatric/Behavioral: Negative for depression and substance abuse.  All other systems reviewed and are negative.  Objective  Blood pressure 138/70, pulse 73, temperature (!) 96.1 F (35.6 C), height 5' (1.524 m), weight 124 lb 3.2 oz (56.3 kg), SpO2 97 %. Body mass index is 24.26 kg/m.   Physical Exam  Constitutional: She appears well-developed and well-nourished. No distress.  HENT:  Head: Atraumatic.  Eyes: Conjunctivae are normal.  Neck: Neck supple. No JVD present. No thyromegaly present.  Cardiovascular: Normal rate, regular rhythm, S1 normal, S2 normal and intact distal pulses. Exam reveals no gallop.  Murmur heard.  Harsh midsystolic murmur is present with a grade of 3/6 at the upper right sternal border radiating to the neck. Pulses:      Carotid pulses are on the right side with bruit and on the left side with bruit.      Popliteal pulses are 2+ on the right side and 2+ on the left side.       Dorsalis pedis pulses are 2+ on the right side and 2+ on the left side.       Posterior tibial pulses are 2+ on the right side and 2+ on the left side.  Pulmonary/Chest: Effort normal and breath sounds normal.  Abdominal: Soft. Bowel sounds are normal.  Musculoskeletal: Normal range of motion.        General: No edema.  Neurological: She is alert.  Skin: Skin is warm and dry.  Psychiatric: She has a normal mood and affect.   Radiology: No results found. Laboratory examination:   Recent Labs    09/12/18 1327 12/11/18 1027 12/15/18 1102 12/15/18 1128  NA 138 136 138  137 137  K 3.8 4.4 3.7  3.7 3.7  CL 104 98  --   --   CO2 24 21  --   --   GLUCOSE 133* 107*  --   --   BUN 16 13  --   --   CREATININE 1.04* 1.36*  --   --   CALCIUM 9.1 9.3  --   --   GFRNONAA 49* 36*  --   --   GFRAA 57* 41*  --   --    CMP Latest Ref Rng & Units 12/15/2018 12/15/2018 12/15/2018  Glucose 65 - 99 mg/dL - - -  BUN 8 - 27 mg/dL - - -  Creatinine  0.57 - 1.00 mg/dL - - -  Sodium 135 - 145 mmol/L 137 138 137  Potassium 3.5 - 5.1 mmol/L 3.7 3.7 3.7  Chloride 96 - 106 mmol/L - - -  CO2 20 - 29 mmol/L - - -  Calcium 8.7 - 10.3 mg/dL - - -  Total Protein 6.5 - 8.1 g/dL - - -  Total Bilirubin  0.3 - 1.2 mg/dL - - -  Alkaline Phos 38 - 126 U/L - - -  AST 15 - 41 U/L - - -  ALT 0 - 44 U/L - - -   CBC Latest Ref Rng & Units 12/15/2018 12/15/2018 12/15/2018  WBC 3.4 - 10.8 x10E3/uL - - -  Hemoglobin 12.0 - 15.0 g/dL 10.2(L) 9.9(L) 9.9(L)  Hematocrit 36.0 - 46.0 % 30.0(L) 29.0(L) 29.0(L)  Platelets 150 - 450 x10E3/uL - - -   Lipid Panel   07/25/2017: Hemoglobin A1c 9%. Creatinine 1.16, EGFR 54, potassium 4.5, alkaline loss 420, AST 55, CMP otherwise normal. ( PCP has held Zetia and simvastatin for 2 months due to elevated LFTs). Hemoglobin 11.8.   Cholesterol 157, triglycerides 105, HDL 60, LDL 78.  05/27/2017: Cholesterol 208, triglycerides 84, HDL 81, LDL 110.     Component Value Date/Time   CHOL 194 06/25/2012 0625   TRIG 98 06/25/2012 0625   HDL 56 06/25/2012 0625   CHOLHDL 3.5 06/25/2012 0625   VLDL 20 06/25/2012 0625   LDLCALC 118 (H) 06/25/2012 0625   HEMOGLOBIN A1C Lab Results  Component Value Date   HGBA1C 5.6 06/25/2012   MPG 114 06/25/2012   TSH Recent Labs    09/12/18 1640 12/11/18 1027  TSH 0.015* 94.200*   Medications   Prior to Admission medications   Medication Sig Start Date End Date Taking? Authorizing Provider  alendronate (FOSAMAX) 70 MG tablet  12/23/17  Yes [provider]  Calcium Carb-Cholecalciferol (CALCIUM 1000 + D PO) Take 1 tablet by mouth daily.   Yes [provider]  cetirizine (ZYRTEC) 10 MG tablet Take 10 mg by mouth daily.   Yes [provider]  clopidogrel (PLAVIX) 75 MG tablet Take 1 tablet (75 mg total) by mouth every other day. 06/08/18  Yes Adrian Prows, MD  ezetimibe (ZETIA) 10 MG tablet Take 10 mg by mouth at bedtime.    Yes [provider]    meclizine (ANTIVERT) 25 MG tablet Take 25 mg by mouth 3 (three) times daily as needed for dizziness.   Yes [provider]  metoprolol succinate (TOPROL-XL) 50 MG 24 hr tablet Take 50 mg by mouth daily. 06/11/18  Yes [provider]  mometasone (NASONEX) 50 MCG/ACT nasal spray Place 2 sprays into the nose daily.   Yes [provider]  Multiple Vitamin (MULTIVITAMIN WITH MINERALS) TABS tablet Take 1 tablet by mouth daily.   Yes [provider]  nitrofurantoin, macrocrystal-monohydrate, (MACROBID) 100 MG capsule Take 1 capsule (100 mg total) by mouth 2 (two) times daily. X 7 days 09/12/18  Yes Pfeiffer, Jeannie Done, MD  benzonatate (TESSALON) 100 MG capsule Take 1-2 capsules (100-200 mg total) by mouth 3 (three) times daily as needed. Patient not taking: Reported on 09/14/2018 08/23/18   Jaynee Eagles, PA-C     Current Outpatient Medications  Medication Instructions  . atorvastatin (LIPITOR) 5 mg, Oral, Daily  . benzonatate (TESSALON) 100-200 mg, Oral, 3 times daily PRN  . cetirizine (ZYRTEC) 10 mg, Daily  . cholecalciferol (VITAMIN D) 1,000 Units, Oral, Daily  . clopidogrel (PLAVIX) 75 mg, Oral, Every other day  . ezetimibe (ZETIA) 10 mg, Oral, Daily  . levothyroxine (SYNTHROID) 50 mcg, Oral, Daily before breakfast  . meclizine (ANTIVERT) 25 mg, Oral, 3 times daily PRN  . mometasone (NASONEX) 50 MCG/ACT nasal spray 2 sprays, Nasal, Every evening  . Multiple Vitamin (MULTIVITAMIN WITH MINERALS) TABS tablet 1 tablet, Oral, 4 times weekly  . Polyethyl Glycol-Propyl  Glycol (SYSTANE) 0.4-0.3 % SOLN 1 drop, Both Eyes, 3 times daily PRN  . propranolol (INDERAL) 20 mg, Oral, 3 times daily  . sodium chloride (OCEAN) 0.65 % SOLN nasal spray 1 spray, Each Nare, Daily    Cardiac Studies:    Exercise myoview stress 04/25/2017: 1. The patient performed treadmill exercise using a Bruce protocol, completing 5 minutes. The patient completed an estimated workload of 4.88 METS,  reaching 103% of the maximum predicted heart rate. Stress symptoms included dyspnea. Exercise capacity fair for age. Normal hemodynamic response. Possible old anteroseptal infarct. Poor R wave progression. No ischemic changes on stress electrocardiogram. 2. The overall quality of the study is excellent. There is no evidence of abnormal lung activity. Stress and rest SPECT images demonstrate homogeneous tracer distribution throughout the myocardium. Gated SPECT imaging reveals normal myocardial thickening and wall motion. The left ventricular ejection fraction was normal (77%). 3. Low risk study.  Carotid artery duplex 11/13/2017: Minimal stenosis in the right internal carotid artery (1-15%). Stenosis in the left internal carotid artery (16-49%). Antegrade right vertebral artery flow. Antegrade left vertebral artery flow. Follow up in one year is appropriate if clinically indicated. Compared to 05/14/2017, Left carotid stenosis severity decreased from >50%.  Echocardiogram 10/01/2018: Left ventricle cavity is normal in size. Mild concentric hypertrophy of the left ventricle. Normal LV systolic function with EF 59%. Normal global wall motion. Doppler evidence of grade II (pseudonormal) diastolic dysfunction, elevated LAP.  Trileaflet aortic valve. Moderate calcification of the aortic valve annulus and leaflets. At least moderate aortic stenosis. Moderate (Grade II) aortic regurgitation. Aortic valve mean gradient of 32 mmHg, Vmax of 3.6  m/s. Calculated aortic valve area by continuity equation is 0.6 cm. Dimensionless index is 0.21. Findings suggest paradoxically low flow low gradient severe aortic stenosis. Consider clinical correlation and further workup. Mild calcification of mitral leaflets. Mild restriction without significant stenosis.  Moderate (Grade II) mitral regurgitation. Mild to moderate tricuspid regurgitation. Estimated pulmonary artery systolic pressure is 35 mmHg.  Compared to the  study done on 05/14/2017, peak gradient was 38 and mean gradient was 20 mmHg and aortic valve area of 0.73 cm by continuity, aortic stenosis is increased in severity.   Right and left heart catheterization 12/15/2018: RA 5/5, mean 5; RV 31/3, EDP 6; PA 32/14, mean 21, PA saturation 67%; PW 11/10, mean 10 mmHg.  CO 3.84, CI  2.5756. QP/QS 1.0.  Normal right heart catheterization. Mildly elevated LVEDP at 15 mmHg.  Peak aortic valve gradient of 27, mean 23.6 mmHg with calculated aortic valve area 0.76 cm.  Findings consistent with severe calcific degenerative aortic valve stenosis. Left dominant circulation, proximal LAD 80% stenosis, large D1 with ostial 90% stenosis, medium sized RI with a proximal 90% stenosis, dominant circumflex mild disease.  RCA mild disease and nondominant.  Recommendation: Patient will need to be evaluated for CABG and aortic valve replacement.  If the TAVR team feels she is a good candidate for TAVR, we could certainly consider either medical management of CAD or PCI to proximal LAD and TAVR. Patient be discharged home today with outpatient follow-up.  Patient has been referred to be evaluated by Dr. Gilford Raid. 30 mL contrast utilized.  Assessment     ICD-10-CM   1. Severe calcific aortic stenosis  I35.0 EKG 12-Lead  2. Atherosclerosis of native coronary artery of native heart with stable angina pectoris ([place])  I25.118 EKG 12-Lead  3. Dyspnea on exertion  R06.00     EKG 12/23/2018: Normal sinus  rhythm at rate of 72 beats minute, biatrial enlargement, left axis deviation, left anterior fascicular block.  Anteroseptal infarct old.  Nonspecific T abnormality.  IVCD, LVH. Compared to 11/23/2018, marked sinus bradycardia at the rate of 42 bpm not present.    Recommendations:   [name] [name]  is a 85 y.o. Caucasian female with history of benign positional vertigo, chronic dyspnea, chronic back pain, hypertension, mixed hyperlipidemia and  Symptomatic aortic stenosis with  worsening dyspnea, underwent right and left heart catheterization on 12/15/2018 with findings consistent with severe aortic valve stenosis.  She was found to have 80% in the proximal LAD, large D1 with ostial 90% stenosis, medium size RI with a proximal 90% stenosis and mild circumflex and RCA disease.  She was referred to Dr. Gilford Raid for evaluation for CABG and AVR vs TAVR vs PCI and TAVR in view of her advanced age.  I have advised her that TAVR team will reassess her situation and make final determination.  For now coronary CTA has been ordered by Dr. Cyndia Bent as well.  Advised her to keep all appointments until further additions made.  No changes in the medications were done today. Since reducing BB dose, HR has improved.   Her options as dictated above very again discussed with the patient and her daughter.  She is 60 years of age but also can occur for husband who is presently 70 years of age.  She does have hyperthyroidism and she is now hypothyroid per recent labs in Nov, TSH was around 95. Now on low dose synthyroid.  Radioactive iodine scan does not reveal any heart thyroid nodules.  She needs evaluation of this as well.  She is on low dose statins due to intolerance for higher dose with abnormal LFT and myalgia.    Adrian Prows, MD, [facility] 12/23/2018, 8:21 PM [facility] [place] Pager: [phone] Office: [phone] If no answer Cell [phone]

## 2018-12-23 ENCOUNTER — Institutional Professional Consult (permissible substitution): Payer: Federal, State, Local not specified - PPO | Admitting: Surgery

## 2018-12-23 ENCOUNTER — Other Ambulatory Visit: Payer: Self-pay

## 2018-12-23 ENCOUNTER — Encounter: Payer: Self-pay | Admitting: Cardiology

## 2018-12-23 ENCOUNTER — Encounter: Payer: Self-pay | Admitting: Surgery

## 2018-12-23 ENCOUNTER — Ambulatory Visit: Payer: Federal, State, Local not specified - PPO | Admitting: Cardiology

## 2018-12-23 VITALS — BP 145/82 | HR 79 | Temp 97.7°F | Resp 20 | Ht 60.0 in | Wt 124.5 lb

## 2018-12-23 VITALS — BP 138/70 | HR 73 | Temp 96.1°F | Ht 60.0 in | Wt 124.2 lb

## 2018-12-23 DIAGNOSIS — R0609 Other forms of dyspnea: Secondary | ICD-10-CM

## 2018-12-23 DIAGNOSIS — I350 Nonrheumatic aortic (valve) stenosis: Secondary | ICD-10-CM

## 2018-12-23 DIAGNOSIS — I2510 Atherosclerotic heart disease of native coronary artery without angina pectoris: Secondary | ICD-10-CM | POA: Diagnosis not present

## 2018-12-23 DIAGNOSIS — I25118 Atherosclerotic heart disease of native coronary artery with other forms of angina pectoris: Secondary | ICD-10-CM | POA: Diagnosis not present

## 2018-12-23 DIAGNOSIS — R0600 Dyspnea, unspecified: Secondary | ICD-10-CM

## 2018-12-23 NOTE — Progress Notes (Signed)
Patient ID: [name] [name], female   DOB: 02/12/1931, 85 y.o.   MRN: [id number]  [place] SURGERY CONSULTATION REPORT  Referring Provider is Adrian Prows, MD Primary Cardiologist is No primary care provider on file. PCP is Kelton Pillar, MD  Chief Complaint  Patient presents with   Aortic Stenosis    Surgical eval, Cardaic Cath 12/15/18, ECHO 10/04/18, CTA Chest 09/12/18   Coronary Artery Disease    HPI:  The patient is an 85 year old woman with a history of hypertension and hyperlipidemia who presents with a several month history of progressive exertional dyspnea and fatigue.  She is still doing her housework but is exhausted by lunchtime.  She gets short of breath with walking any distance and has to stop to rest.  She denies any chest pain or pressure.  She has had no dizziness or syncope.  She had a remote echocardiogram in 2015 which showed mildly calcified aortic valve leaflets with a mean gradient of 12 mmHg across aortic valve.  Her most recent echocardiogram on 10/01/2018 showed a mean gradient of 32 mmHg with a calculated valve area of 0.6 cm.  The dimensionless index was 0.21 consistent with moderate to severe aortic stenosis.  There is also moderate aortic insufficiency.  Left ventricular ejection fraction was 59%.  There is grade 2 diastolic dysfunction.  She subsequently underwent cardiac catheterization on 12/15/2018 which showed a proximal 80% LAD stenosis just before a large first diagonal which had ostial 90% stenosis.  There is a small to medium size intermediate branch with proximal 90% stenosis.  The left circumflex had no significant stenosis.  The right coronary had mild disease and was small and nondominant.  The mean gradient across aortic valve was measured at 23.6 mmHg with a calculated valve area of 0.76 cm.  Cardiac index was 2.6.  LVEDP was mildly elevated at 15 mmHg.  The patient is here today  with her daughter who lives in [place].  She lives at home with her husband who is a few years older and has some dementia.  She provides most of his care.  Past Medical History:  Diagnosis Date   Arthritis    Hyperlipidemia    Hypertension    Lipoma    left arm   Nasal polyps     Past Surgical History:  Procedure Laterality Date   CHOLECYSTECTOMY  02/27/04   INCONTINENCE SURGERY  09/12/09   ORIF ANKLE FRACTURE Right 09/11/2013   Procedure: OPEN REDUCTION INTERNAL FIXATION (ORIF) ANKLE FRACTURE;  Surgeon: Augustin Schooling, MD;  Location: [facility];  Service: Orthopedics;  Laterality: Right;   RIGHT/LEFT HEART CATH AND CORONARY ANGIOGRAPHY N/A 12/15/2018   Procedure: RIGHT/LEFT HEART CATH AND CORONARY ANGIOGRAPHY;  Surgeon: Adrian Prows, MD;  Location: [facility];  Service: Cardiovascular;  Laterality: N/A;   ROTATOR CUFF REPAIR  09/27/2003   TRIGGER FINGER RELEASE  10/29/06    Family History  Problem Relation Age of Onset   Tuberculosis Mother    Other Father        Bronchiectasis   Cancer Maternal Grandmother        ovarian   Sarcoidosis Sister    Asthma Maternal Grandfather    Breast cancer Daughter     Social History   Socioeconomic History   Marital status: Married    Spouse name: Not on file   Number of children: 3   Years of education: Not on file  Highest education level: Not on file  Occupational History   Not on file  Social Needs   Financial resource strain: Not on file   Food insecurity    Worry: Not on file    Inability: Not on file   Transportation needs    Medical: Not on file    Non-medical: Not on file  Tobacco Use   Smoking status: Never Smoker   Smokeless tobacco: Never Used  Substance and Sexual Activity   Alcohol use: No   Drug use: No   Sexual activity: Not on file  Lifestyle   Physical activity    Days per week: Not on file    Minutes per session: Not on file   Stress: Not on file  Relationships    Social connections    Talks on phone: Not on file    Gets together: Not on file    Attends religious service: Not on file    Active member of club or organization: Not on file    Attends meetings of clubs or organizations: Not on file    Relationship status: Not on file   Intimate partner violence    Fear of current or ex partner: Not on file    Emotionally abused: Not on file    Physically abused: Not on file    Forced sexual activity: Not on file  Other Topics Concern   Not on file  Social History Narrative   Not on file    Current Outpatient Medications  Medication Sig Dispense Refill   atorvastatin (LIPITOR) 10 MG tablet Take 0.5 tablets (5 mg total) by mouth daily. 30 tablet 2   benzonatate (TESSALON) 100 MG capsule Take 1-2 capsules (100-200 mg total) by mouth 3 (three) times daily as needed. 60 capsule 0   cetirizine (ZYRTEC) 10 MG tablet Take 10 mg by mouth daily.     cholecalciferol (VITAMIN D) 25 MCG (1000 UT) tablet Take 1,000 Units by mouth daily.     clopidogrel (PLAVIX) 75 MG tablet Take 1 tablet (75 mg total) by mouth every other day. 90 tablet 1   ezetimibe (ZETIA) 10 MG tablet Take 10 mg by mouth daily.      meclizine (ANTIVERT) 25 MG tablet Take 25 mg by mouth 3 (three) times daily as needed for dizziness.     mometasone (NASONEX) 50 MCG/ACT nasal spray Place 2 sprays into the nose every evening.      Multiple Vitamin (MULTIVITAMIN WITH MINERALS) TABS tablet Take 1 tablet by mouth 4 (four) times a week.      Polyethyl Glycol-Propyl Glycol (SYSTANE) 0.4-0.3 % SOLN Place 1 drop into both eyes 3 (three) times daily as needed (dry/irritated eyes.).     propranolol (INDERAL) 40 MG tablet Take 0.5 tablets (20 mg total) by mouth 3 (three) times daily. 270 tablet 3   sodium chloride (OCEAN) 0.65 % SOLN nasal spray Place 1 spray into both nostrils daily.     levothyroxine (SYNTHROID) 50 MCG tablet Take 50 mcg by mouth daily before breakfast.     No current  facility-administered medications for this visit.     Allergies  Allergen Reactions   Aspirin Anaphylaxis   Ceclor [Cefaclor] Anaphylaxis   Statins Other (See Comments)    Elevated liver enzymes   Penicillins Rash    Has patient had a PCN reaction causing immediate rash, facial/tongue/throat swelling, SOB or lightheadedness with hypotension: Yes Has patient had a PCN reaction causing severe rash involving mucus membranes or skin  necrosis: No Has patient had a PCN reaction that required hospitalization No Has patient had a PCN reaction occurring within the last 10 years: No If all of the above answers are "NO", then may proceed with Cephalosporin use.   Sulfa Antibiotics Rash      Review of Systems:   General:  normal appetite, + decreased energy, no weight gain, no weight loss, no fever  Cardiac:  no chest pain with exertion, no chest pain at rest, +SOB with mild exertion, no resting SOB, no PND, no orthopnea, no palpitations, no arrhythmia, no atrial fibrillation, no LE edema, no dizzy spells, no syncope  Respiratory:  + exertional shortness of breath, no home oxygen, no productive cough, no dry cough, no bronchitis, + wheezing, no hemoptysis, no asthma, no pain with inspiration or cough, no sleep apnea, no CPAP at night  GI:   no difficulty swallowing, no reflux, no frequent heartburn, no hiatal hernia, no abdominal pain, no constipation, no diarrhea, no hematochezia, no hematemesis, no melena  GU:   no dysuria,  no frequency, no urinary tract infection, no hematuria, no kidney stones, no kidney disease  Vascular:  no pain suggestive of claudication, no pain in feet, no leg cramps, no varicose veins, no DVT, no non-healing foot ulcer  Neuro:   no stroke, no TIA's, no seizures, no headaches, no temporary blindness one eye,  no slurred speech, no peripheral neuropathy, no chronic pain, no instability of gait, no memory/cognitive dysfunction  Musculoskeletal: + arthritis, no joint  swelling, no myalgias, no difficulty walking, normal mobility   Skin:   no rash, no itching, no skin infections, no pressure sores or ulcerations  Psych:   no anxiety, no depression, no nervousness, no unusual recent stress  Eyes:   no blurry vision, no floaters, no recent vision changes, + wears glasses or contacts  ENT:   no hearing loss, no loose or painful teeth, no dentures, last saw dentist this year.  Hematologic:  + easy bruising, no abnormal bleeding, no clotting disorder, no frequent epistaxis  Endocrine:  no diabetes, does not check CBG's at home           Physical Exam:   BP (!) 145/82 (BP Location: Right Arm)    Pulse 79    Temp 97.7 F (36.5 C) (Skin)    Resp 20    Ht 5' (1.524 m)    Wt 124 lb 8 oz (56.5 kg)    SpO2 95% Comment: RA   BMI 24.31 kg/m   General:  Elderly but well-appearing  HEENT:  Unremarkable, NCAT, PERLA, EOMI  Neck:   no JVD, no bruits, no adenopathy   Chest:   clear to auscultation, symmetrical breath sounds, no wheezes, no rhonchi   CV:   RRR, grade lll/VI crescendo/decrescendo murmur heard best at RSB,  no diastolic murmur  Abdomen:  soft, non-tender, no masses   Extremities:  warm, well-perfused, pulses palpable in feet, no LE edema  Rectal/GU  Deferred  Neuro:   Grossly non-focal and symmetrical throughout  Skin:   Clean and dry, no rashes, no breakdown   Diagnostic Tests:  Echocardiogram 10/01/2018: done at [facility] Left ventricle cavity is normal in size. Mild concentric hypertrophy of the left ventricle. Normal LV systolic function with EF 59%. Normal global wall motion. Doppler evidence of grade II (pseudonormal) diastolic dysfunction, elevated LAP.  Trileaflet aortic valve. Moderate calcification of the aortic valve annulus and leaflets. At least moderate aortic stenosis. Moderate (Grade II) aortic  regurgitation. Aortic valve mean gradient of 32 mmHg, Vmax of 3.6  m/s. Calculated aortic valve area by continuity equation is 0.6  cm. Dimensionless index is 0.21. Findings suggest paradoxically low flow low gradient severe aortic stenosis. Consider clinical correlation and further workup. Mild calcification of mitral leaflets. Mild restriction without significant stenosis. Moderate (Grade II) mitral regurgitation. Mild to moderate tricuspid regurgitation. Estimated pulmonary artery systolic pressure is 35 mmHg.  Compared to previous study, aortic stenosis is increased in severity.   [facility]HE[facility] Order# [id number] Reading physician: Adrian Prows, MD Ordering physician: Adrian Prows, MD Study date: 12/15/18  Physicians  Panel Physicians Referring Physician Case Authorizing Physician  Adrian Prows, MD (Primary)    Procedures  RIGHT/LEFT HEART CATH AND CORONARY ANGIOGRAPHY  Conclusion  Right and left heart catheterization 12/15/2018: RA 5/5, mean 5; RV 31/3, EDP 6; PA 32/14, mean 21, PA saturation 67%; PW 11/10, mean 10 mmHg.  CO 3.84, CI  2.5756. QP/QS 1.0.  Normal right heart catheterization. Mildly elevated LVEDP at 15 mmHg.  Peak aortic valve gradient of 27, mean 23.6 mmHg with calculated aortic valve area 0.76 cm.  Findings consistent with severe calcific degenerative aortic valve stenosis. Left dominant circulation, proximal LAD 80% stenosis, large D1 with ostial 90% stenosis, medium sized RI with a proximal 90% stenosis, dominant circumflex mild disease.  RCA mild disease and nondominant.  Recommendation: Patient will need to be evaluated for CABG and aortic valve replacement.  If the TAVR team feels she is a good candidate for TAVR, we could certainly consider either medical management of CAD or PCI to proximal LAD and TAVR.  Patient be discharged home today with outpatient follow-up.  Patient has been referred to be evaluated by Dr. Gilford Raid. 30 mL contrast utilized.  Indications  Dyspnea on exertion [R06.00 (ICD-10-CM)]  Nonrheumatic aortic valve stenosis [I35.0 (ICD-10-CM)]    Coronary artery disease involving native coronary artery of native heart with other form of angina pectoris (HCC) [I25.118 (ICD-10-CM)]  Procedural Details  Technical Details Procedure performed: Right heart catheterization, left heart catheterization including left ventricular hemodynamic monitoring, selective right and left coronary arteriogram.  Indication: Patient with severe worsening dyspnea, severe aortic stenosis by echocardiogram brought to the cardiac catheterization lab for evaluation of coronary anatomy and evaluation of aortic valve gradients.  Technique: Under sterile precautions using 5 French right antecubital vein access, a balloontipped catheter was advanced into the pulmonary capillary wedge position without any complication.  Right-sided hemodynamics performed carefully and waveform was carefully analyzed.  Catheter then pulled out of the body.  Right radial arterial access obtained, 6 French sheath introduced, a 5 [place] Tiger 4 diagnostic catheter was utilized to perform left heart catheterization.  Selective right and left coronary arteriogram was performed, same catheter was utilized to cross into the left ventricle across the calcified aortic valve, pressure gradient was obtained.  Hemodynamics carefully analyzed.  Catheter then pulled out of the body with a J-wire.  Hemostasis obtained by applying TR band at the arterial access site and manual pressure at the antecubital vein access site.  30 mm contrast utilized.  No immediate complications. Estimated blood loss <50 mL.   During this procedure medications were administered to achieve and maintain moderate conscious sedation while the patient's heart rate, blood pressure, and oxygen saturation were continuously monitored and I was present face-to-face 100% of this time.  Medications (Filter: Administrations occurring from 12/15/18 1027 to 12/15/18 1135) Medication Rate/Dose/Volume Action  Date Time   Heparin (Porcine)  in  NaCl 1000-0.9 UT/500ML-% SOLN (mL) 500 mL Given 12/15/18 1037   Total dose as of 12/15/18 1135 500 mL Given 1037   1,000 mL        fentaNYL (SUBLIMAZE) injection (mcg) 25 mcg Given 12/15/18 1048   Total dose as of 12/15/18 1135        25 mcg        midazolam (VERSED) injection (mg) 1 mg Given 12/15/18 1048   Total dose as of 12/15/18 1135        1 mg        lidocaine (PF) (XYLOCAINE) 1 % injection (mL) 2 mL Given 12/15/18 1055   Total dose as of 12/15/18 1135 2 mL Given 1100   4 mL        heparin injection (Units) 3,000 Units Given 12/15/18 1106   Total dose as of 12/15/18 1135 3,000 Units Canceled Entry 1108   3,000 Units        Radial Cocktail/Verapamil only (mL) 4 mL Given 12/15/18 1103   Total dose as of 12/15/18 1135        4 mL        iohexol (OMNIPAQUE) 350 MG/ML injection (mL) 30 mL Given 12/15/18 1122   Total dose as of 12/15/18 1135        30 mL        Sedation Time  Sedation Time Physician-1: 28 minutes 57 seconds  Contrast  Medication Name Total Dose  iohexol (OMNIPAQUE) 350 MG/ML injection 30 mL    Radiation/Fluoro  Fluoro time: 7.4 (min) DAP: 13904 (mGycm2) Cumulative Air Kerma: 229 (mGy)  Coronary Findings  Diagnostic Dominance: Left Left Anterior Descending  Prox LAD lesion 80% stenosed  Prox LAD lesion is 80% stenosed.  Mid LAD lesion 40% stenosed  Mid LAD lesion is 40% stenosed.  First Diagonal Branch  1st Diag lesion 90% stenosed  1st Diag lesion is 90% stenosed.  Ramus Intermedius  Ramus lesion 90% stenosed  Ramus lesion is 90% stenosed.  Intervention  No interventions have been documented. Right Heart  Right Heart Pressures Hemodynamic findings consistent with pulmonary hypertension. Elevated LV EDP consistent with volume overload.  Left Heart  Left Ventricle LV end diastolic pressure is mildly elevated.  Aortic Valve There is severe aortic valve stenosis. The aortic valve is calcified. There is restricted aortic valve motion. Peak to  peak gradient of 27 and mean gradient of 23.6 mmHg with a calculated aortic valve area of 0.76 cm.  Left and right heart catheterization 12/15/2018: Left and right heart catheterization 12/11/2018:  Coronary Diagrams  Diagnostic Dominance: Left  Intervention  Implants   No implant documentation for this case.  Syngo Images  Show images for CARDIAC CATHETERIZATION  Images on Long Term Storage  Show images for Cornie, Bosch A   Link to Procedure Log  Procedure Log    Hemo Data   Most Recent Value  Fick Cardiac Output 3.84 L/min  Fick Cardiac Output Index 2.57 (L/min)/BSA  Aortic Mean Gradient 23.58 mmHg  Aortic Peak Gradient 27 mmHg  Aortic Valve Area 0.76  Aortic Value Area Index 0.51 cm2/BSA  RA A Wave 5 mmHg  RA V Wave 5 mmHg  RA Mean 5 mmHg  RV Systolic Pressure 31 mmHg  RV Diastolic Pressure 3 mmHg  RV EDP 6 mmHg  PA Systolic Pressure 32 mmHg  PA Diastolic Pressure 14 mmHg  PA Mean 21 mmHg  PW A Wave 11 mmHg  PW V Wave 10 mmHg  PW Mean 10 mmHg  AO Systolic Pressure A999333 mmHg  AO Diastolic Pressure 74 mmHg  AO Mean A999333 mmHg  LV Systolic Pressure A999333 mmHg  LV Diastolic Pressure 10 mmHg  LV EDP 15 mmHg  AOp Systolic Pressure 123456 mmHg  AOp Diastolic Pressure 71 mmHg  AOp Mean Pressure A999333 mmHg  LVp Systolic Pressure A999333 mmHg  LVp Diastolic Pressure 10 mmHg  LVp EDP Pressure 14 mmHg  QP/QS 1  TPVR Index 8.19 HRUI  TSVR Index 42.89 HRUI  PVR SVR Ratio 0.1  TPVR/TSVR Ratio 0.19   [facility] Adult Cardiac Surgery Database Version 4.20 RISK SCORES Procedure: AVR + CAB Risk of Mortality: 6.233% Renal Failure: 5.599% Permanent Stroke: 2.630% Prolonged Ventilation: 15.904% DSW Infection: 0.149% Reoperation: 4.949% Morbidity or Mortality: 25.815% Short Length of Stay: 12.378% Long Length of Stay: 16.228%  Impression:  This 85 year old woman has stage D, severe, symptomatic aortic stenosis with New York Heart Association class III symptoms of  exertional fatigue and shortness of breath consistent with chronic diastolic congestive heart failure.  She also has significant proximal to mid LAD stenosis and an ostial large diagonal stenosis on cardiac catheterization but does not have any anginal symptoms.  I have personally reviewed her 2D echocardiogram and cardiac catheterization studies.  Her echocardiogram shows a trileaflet aortic valve with calcification of the valve leaflets and annulus with restricted leaflet mobility.  The mean gradient was measured at 32 mmHg with a calculated valve area of 0.6 cm and a dimensionless index of 0.21 consistent with severe aortic stenosis.  There is also moderate aortic insufficiency as well as moderate mitral regurgitation and mild to moderate tricuspid regurgitation.  Left ventricular ejection fraction is normal.  Cardiac catheterization shows a mean gradient of 23.6 mmHg across aortic valve with a calculated valve area of 0.76 cm.  There is a proximal 80% LAD stenosis and a 90% ostial stenosis of a large diagonal branch.  There is a 90% proximal stenosis and a small to medium sized ramus branch.  I agree that aortic valve replacement is indicated in this active 85 year old with progressively worsening symptoms consistent with severe aortic stenosis.  I discussed the possible options for treatment including open surgical aortic valve replacement with coronary artery bypass graft surgery, percutaneous intervention for the LAD stenosis followed by TAVR, and medical treatment for her coronary disease with TAVR.  She would like to have TAVR if at all possible since she is 85 years old and is the sole caregiver for her 18 year old husband who has dementia.  She is at moderate risk for open surgical AVR and CABG low would likely have a prolonged recovery given her advanced age.  I told her that I would discuss her case with Dr. Einar Gip and would review her cardiac catheterization with our multidisciplinary heart valve team  before making final recommendations.  She has not had CT scan work-up for TAVR at this time.  I discussed the surgical procedure of aortic valve replacement and coronary artery bypass graft surgery as well as TAVR with the patient and her daughter and answered all their questions.  I think the best course of action from here is to proceed with CT scan work-up for TAVR to be sure that she would be a candidate for that and then we can review her cardiac catheterization with the heart valve team and Dr. Einar Gip to decide about when and if to do PCI.  She and her daughter are in agreement with that.   Plan:  1.  She will be scheduled for a gated cardiac CTA as well as a CTA of the chest, abdomen, and pelvis to complete her TAVR work-up.  2.  We will discuss her cardiac catheterization films at our multidisciplinary heart valve team meeting to decide on if and when to perform PCI of the LAD in conjunction with possible TAVR.   I spent 60 minutes performing this consultation and > 50% of this time was spent face to face counseling and coordinating the care of this patient's severe symptomatic aortic stenosis.   Gaye Pollack, MD 12/23/2018

## 2018-12-25 ENCOUNTER — Other Ambulatory Visit: Payer: Self-pay

## 2018-12-25 DIAGNOSIS — I2510 Atherosclerotic heart disease of native coronary artery without angina pectoris: Secondary | ICD-10-CM

## 2018-12-25 DIAGNOSIS — I350 Nonrheumatic aortic (valve) stenosis: Secondary | ICD-10-CM

## 2018-12-31 ENCOUNTER — Telehealth: Payer: Self-pay

## 2019-01-01 ENCOUNTER — Ambulatory Visit (HOSPITAL_COMMUNITY): Payer: Federal, State, Local not specified - PPO

## 2019-01-01 ENCOUNTER — Ambulatory Visit (HOSPITAL_COMMUNITY)
Admission: RE | Admit: 2019-01-01 | Discharge: 2019-01-01 | Disposition: A | Discharge status: Home / Self Care | Payer: Federal, State, Local not specified - PPO | Source: Ambulatory Visit | Attending: Surgery | Admitting: Surgery

## 2019-01-01 ENCOUNTER — Other Ambulatory Visit (HOSPITAL_COMMUNITY)
Admission: RE | Admit: 2019-01-01 | Discharge: 2019-01-01 | Disposition: A | Discharge status: Home / Self Care | Payer: Federal, State, Local not specified - PPO | Source: Ambulatory Visit | Attending: Cardiology | Admitting: Cardiology

## 2019-01-01 ENCOUNTER — Other Ambulatory Visit: Payer: Self-pay

## 2019-01-01 DIAGNOSIS — I2510 Atherosclerotic heart disease of native coronary artery without angina pectoris: Secondary | ICD-10-CM | POA: Diagnosis not present

## 2019-01-01 DIAGNOSIS — Z20828 Contact with and (suspected) exposure to other viral communicable diseases: Secondary | ICD-10-CM | POA: Diagnosis not present

## 2019-01-01 DIAGNOSIS — I350 Nonrheumatic aortic (valve) stenosis: Secondary | ICD-10-CM | POA: Insufficient documentation

## 2019-01-01 LAB — BASIC METABOLIC PANEL
Anion gap: 10 (ref 5–15)
BUN: 13 mg/dL (ref 8–23)
CO2: 26 mmol/L (ref 22–32)
Calcium: 9.1 mg/dL (ref 8.9–10.3)
Chloride: 98 mmol/L (ref 98–111)
Creatinine, Ser: 1.19 mg/dL — ABNORMAL HIGH (ref 0.44–1.00)
GFR calc Af Amer: 48 mL/min — ABNORMAL LOW (ref >60)
GFR calc non Af Amer: 42 mL/min — ABNORMAL LOW (ref >60)
Glucose, Bld: 109 mg/dL — ABNORMAL HIGH (ref 70–99)
Potassium: 4.5 mmol/L (ref 3.5–5.1)
Sodium: 134 mmol/L — ABNORMAL LOW (ref 135–145)

## 2019-01-01 MED ORDER — SODIUM CHLORIDE 0.9 % WEIGHT BASED INFUSION
3.0000 mL/kg/h | INTRAVENOUS | Status: AC
  Administered 2019-01-01: 3 mL/kg/h via INTRAVENOUS

## 2019-01-01 MED ORDER — SODIUM CHLORIDE 0.9 % WEIGHT BASED INFUSION: 1.0000 mL/kg/h | INTRAVENOUS | Status: DC

## 2019-01-01 MED ORDER — IOHEXOL 350 MG/ML SOLN
100.0000 mL | Freq: Once | INTRAVENOUS | Status: AC | PRN
  Administered 2019-01-01: 100 mL via INTRAVENOUS

## 2019-01-01 NOTE — Progress Notes (Signed)
Caroitd Duplex complete. Please see CV proc tab for preliminary results. [place], RVT 10:29 AM  01/01/2019

## 2019-01-02 LAB — NOVEL CORONAVIRUS, NAA (HOSP ORDER, SEND-OUT TO REF LAB; TAT 18-24 HRS): SARS-CoV-2, NAA: NOT DETECTED

## 2019-01-03 NOTE — Telephone Encounter (Signed)
I have discussed with the patient during the PCI, he is allergic to aspirin, presently on Plavix.  I will consider cilostazol.  She will need atherectomy followed by stenting to the LAD possibly atherectomy to the diagonal as well.  Patient is aware.  This will be followed by TAVR.

## 2019-01-05 ENCOUNTER — Ambulatory Visit (HOSPITAL_COMMUNITY)
Admission: RE | Admit: 2019-01-05 | Discharge: 2019-01-06 | Disposition: A | Discharge status: Home / Self Care | Payer: Federal, State, Local not specified - PPO | Attending: Cardiology | Admitting: Cardiology

## 2019-01-05 ENCOUNTER — Encounter (HOSPITAL_COMMUNITY)
Admission: RE | Disposition: A | Discharge status: Home / Self Care | Payer: Self-pay | Source: Home / Self Care | Attending: Cardiology

## 2019-01-05 ENCOUNTER — Other Ambulatory Visit: Payer: Self-pay

## 2019-01-05 ENCOUNTER — Encounter (HOSPITAL_COMMUNITY): Payer: Self-pay | Admitting: Cardiology

## 2019-01-05 DIAGNOSIS — I10 Essential (primary) hypertension: Secondary | ICD-10-CM | POA: Diagnosis not present

## 2019-01-05 DIAGNOSIS — M1990 Unspecified osteoarthritis, unspecified site: Secondary | ICD-10-CM | POA: Diagnosis not present

## 2019-01-05 DIAGNOSIS — I350 Nonrheumatic aortic (valve) stenosis: Secondary | ICD-10-CM | POA: Insufficient documentation

## 2019-01-05 DIAGNOSIS — I25118 Atherosclerotic heart disease of native coronary artery with other forms of angina pectoris: Secondary | ICD-10-CM

## 2019-01-05 DIAGNOSIS — Z7902 Long term (current) use of antithrombotics/antiplatelets: Secondary | ICD-10-CM | POA: Insufficient documentation

## 2019-01-05 DIAGNOSIS — I2584 Coronary atherosclerosis due to calcified coronary lesion: Secondary | ICD-10-CM | POA: Insufficient documentation

## 2019-01-05 DIAGNOSIS — R0609 Other forms of dyspnea: Secondary | ICD-10-CM | POA: Diagnosis present

## 2019-01-05 DIAGNOSIS — I2510 Atherosclerotic heart disease of native coronary artery without angina pectoris: Secondary | ICD-10-CM | POA: Diagnosis present

## 2019-01-05 DIAGNOSIS — Z955 Presence of coronary angioplasty implant and graft: Secondary | ICD-10-CM

## 2019-01-05 DIAGNOSIS — E782 Mixed hyperlipidemia: Secondary | ICD-10-CM | POA: Insufficient documentation

## 2019-01-05 DIAGNOSIS — Z79899 Other long term (current) drug therapy: Secondary | ICD-10-CM | POA: Diagnosis not present

## 2019-01-05 DIAGNOSIS — Z9861 Coronary angioplasty status: Secondary | ICD-10-CM

## 2019-01-05 DIAGNOSIS — R0600 Dyspnea, unspecified: Secondary | ICD-10-CM | POA: Diagnosis not present

## 2019-01-05 HISTORY — PX: CORONARY BALLOON ANGIOPLASTY: CATH118233

## 2019-01-05 HISTORY — PX: CORONARY STENT INTERVENTION: CATH118234

## 2019-01-05 HISTORY — PX: CORONARY ATHERECTOMY: CATH118238

## 2019-01-05 HISTORY — DX: Atherosclerotic heart disease of native coronary artery without angina pectoris: I25.10

## 2019-01-05 LAB — POCT I-STAT, CHEM 8
BUN: 16 mg/dL (ref 8–23)
Calcium, Ion: 1.19 mmol/L (ref 1.15–1.40)
Chloride: 101 mmol/L (ref 98–111)
Creatinine, Ser: 1 mg/dL (ref 0.44–1.00)
Glucose, Bld: 100 mg/dL — ABNORMAL HIGH (ref 70–99)
HCT: 29 % — ABNORMAL LOW (ref 36.0–46.0)
Hemoglobin: 9.9 g/dL — ABNORMAL LOW (ref 12.0–15.0)
Potassium: 4 mmol/L (ref 3.5–5.1)
Sodium: 135 mmol/L (ref 135–145)
TCO2: 28 mmol/L (ref 22–32)

## 2019-01-05 LAB — CBC
HCT: 30.5 % — ABNORMAL LOW (ref 36.0–46.0)
Hemoglobin: 9.8 g/dL — ABNORMAL LOW (ref 12.0–15.0)
MCH: 30.4 pg (ref 26.0–34.0)
MCHC: 32.1 g/dL (ref 30.0–36.0)
MCV: 94.7 fL (ref 80.0–100.0)
Platelets: 267 10*3/uL (ref 150–400)
RBC: 3.22 MIL/uL — ABNORMAL LOW (ref 3.87–5.11)
RDW: 15.5 % (ref 11.5–15.5)
WBC: 6.7 10*3/uL (ref 4.0–10.5)
nRBC: 0 % (ref 0.0–0.2)

## 2019-01-05 LAB — POCT ACTIVATED CLOTTING TIME
Activated Clotting Time: 279 seconds
Activated Clotting Time: 329 seconds

## 2019-01-05 SURGERY — CORONARY STENT INTERVENTION
Anesthesia: LOCAL

## 2019-01-05 MED ORDER — SODIUM CHLORIDE 0.9 % WEIGHT BASED INFUSION
3.0000 mL/kg/h | INTRAVENOUS | Status: AC
  Administered 2019-01-05: 3 mL/kg/h via INTRAVENOUS

## 2019-01-05 MED ORDER — VERAPAMIL HCL 2.5 MG/ML IV SOLN
INTRAVENOUS | Status: DC | PRN
  Administered 2019-01-05: 5 mL via INTRA_ARTERIAL

## 2019-01-05 MED ORDER — SODIUM CHLORIDE 0.9% FLUSH: 3.0000 mL | INTRAVENOUS | Status: DC | PRN

## 2019-01-05 MED ORDER — MIDAZOLAM HCL 2 MG/2ML IJ SOLN
INTRAMUSCULAR | Status: AC
  Filled 2019-01-05: qty 2

## 2019-01-05 MED ORDER — HEPARIN (PORCINE) IN NACL 1000-0.9 UT/500ML-% IV SOLN
INTRAVENOUS | Status: DC | PRN
  Administered 2019-01-05 (×2): 500 mL

## 2019-01-05 MED ORDER — SODIUM CHLORIDE 0.9 % IV SOLN: 250.0000 mL | INTRAVENOUS | Status: DC | PRN

## 2019-01-05 MED ORDER — MIDAZOLAM HCL 2 MG/2ML IJ SOLN
INTRAMUSCULAR | Status: DC | PRN
  Administered 2019-01-05: 1 mg via INTRAVENOUS

## 2019-01-05 MED ORDER — LIDOCAINE HCL (PF) 1 % IJ SOLN
INTRAMUSCULAR | Status: AC
  Filled 2019-01-05: qty 30

## 2019-01-05 MED ORDER — ACETAMINOPHEN 325 MG PO TABS: 650.0000 mg | ORAL_TABLET | ORAL | Status: DC | PRN

## 2019-01-05 MED ORDER — CLOPIDOGREL BISULFATE 75 MG PO TABS
75.0000 mg | ORAL_TABLET | Freq: Every day | ORAL | Status: DC
  Administered 2019-01-06: 75 mg via ORAL
  Filled 2019-01-05: qty 1

## 2019-01-05 MED ORDER — VIPERSLIDE LUBRICANT OPTIME
TOPICAL | Status: DC | PRN
  Administered 2019-01-05: 200 mL via SURGICAL_CAVITY

## 2019-01-05 MED ORDER — NITROGLYCERIN 1 MG/10 ML FOR IR/CATH LAB
INTRA_ARTERIAL | Status: AC
  Filled 2019-01-05: qty 10

## 2019-01-05 MED ORDER — VERAPAMIL HCL 2.5 MG/ML IV SOLN
INTRAVENOUS | Status: AC
  Filled 2019-01-05: qty 2

## 2019-01-05 MED ORDER — FENTANYL CITRATE (PF) 100 MCG/2ML IJ SOLN
INTRAMUSCULAR | Status: AC
  Filled 2019-01-05: qty 2

## 2019-01-05 MED ORDER — ONDANSETRON HCL 4 MG/2ML IJ SOLN: 4.0000 mg | Freq: Four times a day (QID) | INTRAMUSCULAR | Status: DC | PRN

## 2019-01-05 MED ORDER — HYDRALAZINE HCL 20 MG/ML IJ SOLN: 5.0000 mg | INTRAMUSCULAR | Status: AC | PRN

## 2019-01-05 MED ORDER — CLOPIDOGREL BISULFATE 75 MG PO TABS
300.0000 mg | ORAL_TABLET | ORAL | Status: AC
  Administered 2019-01-05: 225 mg via ORAL
  Filled 2019-01-05: qty 4

## 2019-01-05 MED ORDER — CILOSTAZOL 50 MG PO TABS
50.0000 mg | ORAL_TABLET | Freq: Two times a day (BID) | ORAL | Status: DC
  Administered 2019-01-05: 50 mg via ORAL
  Filled 2019-01-05: qty 1

## 2019-01-05 MED ORDER — HEPARIN (PORCINE) IN NACL 1000-0.9 UT/500ML-% IV SOLN
INTRAVENOUS | Status: AC
  Filled 2019-01-05: qty 2000

## 2019-01-05 MED ORDER — NITROGLYCERIN 1 MG/10 ML FOR IR/CATH LAB
INTRA_ARTERIAL | Status: DC | PRN
  Administered 2019-01-05 (×3): 200 ug via INTRACORONARY

## 2019-01-05 MED ORDER — VIPERSLIDE LUBRICANT OPTIME
TOPICAL | Status: DC | PRN
  Administered 2019-01-05: 20 mL via SURGICAL_CAVITY

## 2019-01-05 MED ORDER — SODIUM CHLORIDE 0.9 % WEIGHT BASED INFUSION: 1.0000 mL/kg/h | INTRAVENOUS | Status: DC

## 2019-01-05 MED ORDER — CILOSTAZOL 50 MG PO TABS: 50.0000 mg | ORAL_TABLET | Freq: Two times a day (BID) | ORAL | 2 refills | Status: DC

## 2019-01-05 MED ORDER — SODIUM CHLORIDE 0.9% FLUSH
3.0000 mL | Freq: Two times a day (BID) | INTRAVENOUS | Status: DC
  Administered 2019-01-06: 3 mL via INTRAVENOUS

## 2019-01-05 MED ORDER — HEPARIN SODIUM (PORCINE) 1000 UNIT/ML IJ SOLN
INTRAMUSCULAR | Status: DC | PRN
  Administered 2019-01-05: 6000 [IU] via INTRAVENOUS
  Administered 2019-01-05: 2000 [IU] via INTRAVENOUS

## 2019-01-05 MED ORDER — SODIUM CHLORIDE 0.9 % WEIGHT BASED INFUSION: 1.0000 mL/kg/h | INTRAVENOUS | Status: AC

## 2019-01-05 MED ORDER — HEPARIN SODIUM (PORCINE) 1000 UNIT/ML IJ SOLN
INTRAMUSCULAR | Status: AC
  Filled 2019-01-05: qty 1

## 2019-01-05 MED ORDER — IOHEXOL 350 MG/ML SOLN
INTRAVENOUS | Status: DC | PRN
  Administered 2019-01-05: 12:00:00 125 mL

## 2019-01-05 MED ORDER — FENTANYL CITRATE (PF) 100 MCG/2ML IJ SOLN
INTRAMUSCULAR | Status: DC | PRN
  Administered 2019-01-05: 25 ug via INTRAVENOUS

## 2019-01-05 MED ORDER — SODIUM CHLORIDE 0.9% FLUSH: 3.0000 mL | Freq: Two times a day (BID) | INTRAVENOUS | Status: DC

## 2019-01-05 MED ORDER — LIDOCAINE HCL (PF) 1 % IJ SOLN
INTRAMUSCULAR | Status: DC | PRN
  Administered 2019-01-05: 15 mL
  Administered 2019-01-05: 2 mL

## 2019-01-05 SURGICAL SUPPLY — 27 items
BALLN SAPPHIRE 2.5X15 (BALLOONS) ×2
BALLN SAPPHIRE 3.0X15 (BALLOONS) ×2
BALLN SAPPHIRE ~~LOC~~ 3.5X8 (BALLOONS) ×2 IMPLANT
BALLOON SAPPHIRE 2.5X15 (BALLOONS) ×1 IMPLANT
BALLOON SAPPHIRE 3.0X15 (BALLOONS) ×1 IMPLANT
CATH VISTA GUIDE 6FR XB3.5 (CATHETERS) ×2 IMPLANT
CLOSURE MYNX CONTROL 6F/7F (Vascular Products) ×2 IMPLANT
CROWN DIAMONDBACK CLASSIC 1.25 (BURR) ×2 IMPLANT
DEVICE RAD COMP TR BAND LRG (VASCULAR PRODUCTS) ×2 IMPLANT
ELECT DEFIB PAD ADLT CADENCE (PAD) ×2 IMPLANT
GLIDESHEATH SLEND A-KIT 6F 22G (SHEATH) ×2 IMPLANT
GUIDEWIRE ANGLED .035X150CM (WIRE) ×2 IMPLANT
GUIDEWIRE INQWIRE 1.5J.035X260 (WIRE) ×1 IMPLANT
INQWIRE 1.5J .035X260CM (WIRE) ×2
KIT ENCORE 26 ADVANTAGE (KITS) ×2 IMPLANT
KIT HEART LEFT (KITS) ×2 IMPLANT
KIT MICROPUNCTURE NIT STIFF (SHEATH) ×2 IMPLANT
LUBRICANT VIPERSLIDE CORONARY (MISCELLANEOUS) ×2 IMPLANT
PACK CARDIAC CATHETERIZATION (CUSTOM PROCEDURE TRAY) ×2 IMPLANT
SHEATH PINNACLE 6F 10CM (SHEATH) ×2 IMPLANT
SHEATH PROBE COVER 6X72 (BAG) ×2 IMPLANT
STENT RESOLUTE ONYX 3.0X18 (Permanent Stent) ×2 IMPLANT
TRANSDUCER W/STOPCOCK (MISCELLANEOUS) ×2 IMPLANT
TUBING CIL FLEX 10 FLL-RA (TUBING) ×2 IMPLANT
WIRE COUGAR XT STRL 190CM (WIRE) ×4 IMPLANT
WIRE EMERALD 3MM-J .035X150CM (WIRE) ×2 IMPLANT
WIRE VIPERWIRE COR FLEX .012 (WIRE) ×2 IMPLANT

## 2019-01-05 NOTE — Progress Notes (Signed)
Cuff removed rt forearm. Assessed rt wrist and arm w/Bryan Quentin Cornwall. Level 1 hematoma rt forearm. Palpable rt radial.

## 2019-01-05 NOTE — Progress Notes (Signed)
Report taken. Cuff removed from rt forearm to assess.Rt forearm firm and tender. Cuff reapplied to rt forearm w/62mm Hg; rt arm elevated on pillow. Good pleth w/probe on rt thumb; rt hand pink

## 2019-01-05 NOTE — Consult Note (Addendum)
Cardiology Consultation:   Patient ID: [name] [name] MRN: [id number]; DOB: 09-Jan-1932  Admit date: 01/05/2019 Date of Consult: 01/05/2019  Primary Care Provider: Kelton Pillar, MD Primary Cardiologist: Adrian Prows, MD Primary Electrophysiologist:  None  Referring Cardiologist: Dr. Einar Gip   Patient Profile:   [name] [name] is a 85 y.o. female with a history of HLD, HTN, CAD and severe aortic stenosis who is being seen today for the evaluation of aortic stenosis and possible TAVR at the request of Dr. Einar Gip.   History of Present Illness:   [name] [name] is a 85 yo female with history of hyperlipidemia, HTN, CAD and severe aortic stenosis who I am asked to see today by Dr. Einar Gip for discussion of her aortic stenosis and possible TAVR. She has been followed by Dr. Einar Gip. She was seen in the CT surgery office on 12/23/18 by Dr. Cyndia Bent. She has had progressive exertional dyspnea and fatigue. Remote echo in 2015 with mild aortic stenosis. Most recent echo 10/01/18 with LVEF=59%. There is severe aortic stenosis with mean gradient 32 mmHg, AVA 0.6 cm2, dimensionless index 0.21. There is moderate aortic valve insufficiency. Cardiac catheterization 12/15/18 with severe, calcific disease in the proximal LAD with high grade disease in the ostium of the diagonal branch. The intermediate branch is small to medium in size with proximal 90% stenosis. The Circumflex has mild disease. The RCA is small and non-dominant. By cath the mean gradient across the aortic valve was 23.6 mmHg with AVA 0.76 cm2. She underwent PCI today with orbital atherectomy of the proximal LAD and placement of a drug eluting stent. The Diagonal branch was treated with orbital atherectomy and balloon angioplasty.   She tells me today that she has progressive dyspnea with exertion and fatigue. She lives at home with her husband who has dementia.   Past Medical History:  Diagnosis Date  . Aortic stenosis   . Arthritis   . CAD (coronary artery  disease)   . Hyperlipidemia   . Hypertension   . Lipoma    left arm  . Nasal polyps     Past Surgical History:  Procedure Laterality Date  . CHOLECYSTECTOMY  02/27/04  . INCONTINENCE SURGERY  09/12/09  . ORIF ANKLE FRACTURE Right 09/11/2013   Procedure: OPEN REDUCTION INTERNAL FIXATION (ORIF) ANKLE FRACTURE;  Surgeon: Augustin Schooling, MD;  Location: [facility];  Service: Orthopedics;  Laterality: Right;  . RIGHT/LEFT HEART CATH AND CORONARY ANGIOGRAPHY N/A 12/15/2018   Procedure: RIGHT/LEFT HEART CATH AND CORONARY ANGIOGRAPHY;  Surgeon: Adrian Prows, MD;  Location: [facility];  Service: Cardiovascular;  Laterality: N/A;  . ROTATOR CUFF REPAIR  09/27/2003  . TRIGGER FINGER RELEASE  10/29/06     Home Medications:  Prior to Admission medications   Medication Sig Start Date End Date Taking? Authorizing Provider  atorvastatin (LIPITOR) 10 MG tablet Take 0.5 tablets (5 mg total) by mouth daily. 11/23/18 02/21/19 Yes Adrian Prows, MD  cetirizine (ZYRTEC) 10 MG tablet Take 10 mg by mouth daily.   Yes [provider]  cholecalciferol (VITAMIN D) 25 MCG (1000 UT) tablet Take 1,000 Units by mouth daily.   Yes [provider]  clopidogrel (PLAVIX) 75 MG tablet Take 1 tablet (75 mg total) by mouth every other day. Patient taking differently: Take 75 mg by mouth daily.  06/08/18  Yes Adrian Prows, MD  ezetimibe (ZETIA) 10 MG tablet Take 10 mg by mouth daily.    Yes [provider]  levothyroxine (SYNTHROID) 50 MCG  tablet Take 50 mcg by mouth daily before breakfast.   Yes [provider]  mometasone (NASONEX) 50 MCG/ACT nasal spray Place 2 sprays into the nose every evening.    Yes [provider]  Multiple Vitamin (MULTIVITAMIN WITH MINERALS) TABS tablet Take 1 tablet by mouth 4 (four) times a week.    Yes [provider]  Polyethyl Glycol-Propyl Glycol (SYSTANE) 0.4-0.3 % SOLN Place 1 drop into both eyes 3 (three) times daily as needed (dry/irritated  eyes.).   Yes [provider]  propranolol (INDERAL) 40 MG tablet Take 0.5 tablets (20 mg total) by mouth 3 (three) times daily. 11/23/18  Yes Adrian Prows, MD  sodium chloride (OCEAN) 0.65 % SOLN nasal spray Place 1 spray into both nostrils daily.   Yes [provider]  benzonatate (TESSALON) 100 MG capsule Take 1-2 capsules (100-200 mg total) by mouth 3 (three) times daily as needed. 08/23/18   Jaynee Eagles, PA-C  cilostazol (PLETAL) 50 MG tablet Take 1 tablet (50 mg total) by mouth 2 (two) times daily. 01/05/19   Adrian Prows, MD  meclizine (ANTIVERT) 25 MG tablet Take 25 mg by mouth 3 (three) times daily as needed for dizziness.    [provider]    Inpatient Medications: Scheduled Meds: . cilostazol  50 mg Oral BID  . sodium chloride flush  3 mL Intravenous Q12H   Continuous Infusions: . sodium chloride    . sodium chloride 1 mL/kg/hr (01/05/19 0946)   PRN Meds: sodium chloride, sodium chloride flush  Allergies:    Allergies  Allergen Reactions  . Aspirin Anaphylaxis  . Ceclor [Cefaclor] Anaphylaxis  . Statins Other (See Comments)    Elevated liver enzymes  . Penicillins Rash    Has patient had a PCN reaction causing immediate rash, facial/tongue/throat swelling, SOB or lightheadedness with hypotension: Yes Has patient had a PCN reaction causing severe rash involving mucus membranes or skin necrosis: No Has patient had a PCN reaction that required hospitalization No Has patient had a PCN reaction occurring within the last 10 years: No If all of the above answers are "NO", then may proceed with Cephalosporin use.  . Sulfa Antibiotics Rash    Social History:   Social History   Socioeconomic History  . Marital status: Married    Spouse name: Not on file  . Number of children: 3  . Years of education: Not on file  . Highest education level: Not on file  Occupational History  . Not on file  Tobacco Use  . Smoking status: Never Smoker  . Smokeless  tobacco: Never Used  Substance and Sexual Activity  . Alcohol use: No  . Drug use: No  . Sexual activity: Not on file  Other Topics Concern  . Not on file  Social History Narrative  . Not on file   Social Determinants of Health   Financial Resource Strain:   . Difficulty of Paying Living Expenses: Not on file  Food Insecurity:   . Worried About Charity fundraiser in the Last Year: Not on file  . Ran Out of Food in the Last Year: Not on file  Transportation Needs:   . Lack of Transportation (Medical): Not on file  . Lack of Transportation (Non-Medical): Not on file  Physical Activity:   . Days of Exercise per Week: Not on file  . Minutes of Exercise per Session: Not on file  Stress:   . Feeling of Stress : Not on file  Social  Connections:   . Frequency of Communication with Friends and Family: Not on file  . Frequency of Social Gatherings with Friends and Family: Not on file  . Attends Religious Services: Not on file  . Active Member of Clubs or Organizations: Not on file  . Attends Archivist Meetings: Not on file  . Marital Status: Not on file  Intimate Partner Violence:   . Fear of Current or Ex-Partner: Not on file  . Emotionally Abused: Not on file  . Physically Abused: Not on file  . Sexually Abused: Not on file    Family History:   Family History  Problem Relation Age of Onset  . Tuberculosis Mother   . Other Father        Bronchiectasis  . Cancer Maternal Grandmother        ovarian  . Sarcoidosis Sister   . Asthma Maternal Grandfather   . Breast cancer Daughter      ROS:  Please see the history of present illness.  All other ROS reviewed and negative.     Physical Exam/Data:   Vitals:   01/05/19 1251 01/05/19 1256 01/05/19 1415 01/05/19 1425  BP: 97/82 (!) 106/59 121/64 (!) 102/55  Pulse: 73 72 73 70  Resp: 13 15 12 12   Temp:      TempSrc:      SpO2: 97% 97% 98% 98%  Weight:      Height:       No intake or output data in the 24  hours ending 01/05/19 1459 Last 3 Weights 01/05/2019 01/01/2019 12/23/2018  Weight (lbs) 121 lb 8 oz 121 lb 124 lb 3.2 oz  Weight (kg) 55.112 kg 54.885 kg 56.337 kg     Body mass index is 23.73 kg/m.  General:  Well nourished, well developed, in no acute distress HEENT: normal Lymph: no adenopathy Neck: no JVD Endocrine:  No thryomegaly Vascular: No carotid bruits; FA pulses 2+ bilaterally without bruits  Cardiac:  normal S1, S2; RRR; Loud, harsh systolic murmur.  Lungs:  clear to auscultation bilaterally, no wheezing, rhonchi or rales  Abd: soft, nontender, no hepatomegaly  Ext: no edema Musculoskeletal:  No deformities, BUE and BLE strength normal and equal Skin: warm and dry  Neuro:  CNs 2-12 intact, no focal abnormalities noted Psych:  Normal affect   EKG:  The EKG was personally reviewed and demonstrates:  NSR with inferior Q waves and poor R wave progression through the precordial leads.   Relevant CV Studies:  Cardiac cath 112/4/20: Left Anterior Descending  Prox LAD lesion 80% stenosed  Prox LAD lesion is 80% stenosed.  Mid LAD lesion 40% stenosed  Mid LAD lesion is 40% stenosed.  First Diagonal Branch  1st Diag lesion 90% stenosed  1st Diag lesion is 90% stenosed.  Ramus Intermedius  Ramus lesion 90% stenosed  Ramus lesion is 90% stenosed.   Diagnostic Dominance: Left    Cardiac cath/PCI 01/05/19:  Successful orbital atherectomy followed by stenting of the proximal LAD with a 3.0 x 18 mm resolute Onyx DES and proximal segment postdilated with a 3.5 x 8 mm Sapphire Martell.  Stenosis 90% to 0%.  TIMI-3 to TIMI-3 flow.  Orbital atherectomy followed by balloon angioplasty with 2.5 x 15 mm Sapphire balloon for D1, 90% to less than 15 to 20% stenosis.  TIMI-3 to TIMI-3 flow. Intervention       Echo September 2020: Left ventricle cavity is normal in size. Mild concentric hypertrophy of the left ventricle. Normal  LV systolic function with EF 59%. Normal global wall  motion. Doppler evidence of grade II (pseudonormal) diastolic dysfunction, elevated LAP.  Trileaflet aortic valve. Moderate calcification of the aortic valve annulus and leaflets. At least moderate aortic stenosis. Moderate (Grade II) aortic regurgitation. Aortic valve mean gradient of 32 mmHg, Vmax of 3.6  m/s. Calculated aortic valve area by continuity equation is 0.6 cm. Dimensionless index is 0.21. Findings suggest paradoxically low flow low gradient severe aortic stenosis. Consider clinical correlation and further workup. Mild calcification of mitral leaflets. Mild restriction without significant stenosis. Moderate (Grade II) mitral regurgitation. Mild to moderate tricuspid regurgitation. Estimated pulmonary artery systolic pressure is 35 mmHg.  Compared to previous study, aortic stenosis is increased in severity.   Gated Cardiac CTA 01/01/19: FINDINGS: Image quality: Excellent.  Noise artifact is: Limited.  Valve Morphology: Severely calcified/thickened aortic valve. Tricuspid with functional fusion of the RCC/NCC. Leaflet motion of the RCC/NCC is severely restricted.  Aortic Valve area: 0.63 cm2  Aortic Valve Calcium score: 1333 AU  Aortic annular dimension:  Phase assessed: 20%  Annular area: 3.03 cm2  Annular perimeter: 62.3 mm  Max diameter: 20.4 mm  Min diameter: 19.6 mm  Annular/Subannular calcification: No annular calcification and no subannular calcification.  Optimal coplanar projection: LAO 25 CAU 5  Coronary Artery Height above Annulus:  Left Main: 14.7 mm  Right Coronary: 15.4 mm  Sinus of Valsalva Measurements:  Non-coronary: 24.5 mm  Right-coronary: 24.2 mm  Left-coronary: 24.3 mm  Sinotubular Junction: 25.6 mm with mild calcifications.  Sinotubular Junction Height: 16.3 mm  Ascending Thoracic Aorta: 29 mm without significant calcifications.  Coronary arteries: Study performed without NTG, and complete assessment  not made. Would refer to recent cardiac cath for coronary assessment.  Coronary calcium score: 594 (78th percentile for age- and sex-matched controls).  Left main: The left main is a large caliber vessel with a normal take off from the left coronary cusp that trifurcates into a LAD, LCX, and ramus intermedius. There is no plaque or stenosis.  Left anterior descending artery: The proximal LAD contains heavily calcified plaque of indeterminate severity.  Ramus intermedius: Moderate to severe proximal stenosis.  Left circumflex artery: The LCX is dominant without evidence of proximal stenosis.  Right coronary artery: The RCA is non-dominant.  Cardiac Morphology:  Right Atrium: Right atrial size is within normal limits.  Right Ventricle: The right ventricular cavity is within normal limits.  Left Atrium: Left atrial size is normal in size with no left atrial appendage filling defect.  Left Ventricle: The ventricular cavity size is within normal limits. Sigmoid septum noted. There are no stigmata of prior infarction. There is no abnormal filling defect. Hyperdynamic LV function, EF estimated to be 85%. No regional wall motion abnormalities.  Pulmonary arteries: Normal in size without proximal filling defect.  Pulmonary veins: Normal pulmonary venous drainage. There were 3 noted pulmonary veins, 2 on the right and 1 on the left.  Pericardium: Normal thickness with no significant effusion or calcium present.  Mitral Valve: The mitral valve is thickened with moderate annular calcification.  Extra-cardiac findings: Very large hiatal hernia. Tortuous descending aorta. See attached radiology report for non-cardiac structures.  IMPRESSION: 1. Severe aortic stenosis. 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  2. Annular area 3.03 cm2 (perimeter 62.3 mm). No significant annular/subannular calcification noted.  3. Sufficient coronary to  annulus distance.  4. Optimum Fluoroscopic Angle for Delivery: LAO 25 CAU 5  CTA chest/abd/pelvis:  CTA CHEST FINDINGS  Cardiovascular: Heart size is normal. There is no significant pericardial fluid, thickening or pericardial calcification. There is aortic atherosclerosis, as well as atherosclerosis of the great vessels of the mediastinum and the coronary arteries, including calcified atherosclerotic plaque in the left main, left anterior descending and right coronary arteries. Thickening calcification of the aortic valve. Calcifications of the mitral annulus.  Mediastinum/Lymph Nodes: No pathologically enlarged mediastinal or hilar lymph nodes. Large hiatal hernia. No axillary lymphadenopathy.  Lungs/Pleura: 8 x 6 mm nodule in the lateral segment of the right middle lobe (axial image 72 of series 8), similar to prior study 09/12/2018. No other larger more suspicious appearing pulmonary nodules or masses are noted. No acute consolidative airspace disease. No pleural effusions.  Musculoskeletal/Soft Tissues: Chronic compression fracture of T12 with 40% loss of anterior vertebral body height. There are no aggressive appearing lytic or blastic lesions noted in the visualized portions of the skeleton.  CTA ABDOMEN AND PELVIS FINDINGS  Hepatobiliary: Liver has a shrunken appearance and nodular contour, indicative of underlying cirrhosis. No suspicious cystic or solid hepatic lesions. No intra or extrahepatic biliary ductal dilatation. Status post cholecystectomy.  Pancreas: No pancreatic mass. No pancreatic ductal dilatation. No pancreatic or peripancreatic fluid collections or inflammatory changes.  Spleen: Unremarkable.  Adrenals/Urinary Tract: Subcentimeter low-attenuation lesion in the lateral aspect of the interpolar region of the right kidney, too small to characterize, but statistically likely to represent a cyst. Left kidney and bilateral adrenal glands are  normal in appearance. No hydroureteronephrosis. Urinary bladder is normal in appearance.  Stomach/Bowel: Stomach is nearly completely intrathoracic. No pathologic dilatation of small bowel or colon. Normal appendix.  Vascular/Lymphatic: Aortic atherosclerosis, with vascular findings and measurements pertinent to potential TAVR procedure, as detailed below. No lymphadenopathy noted in the abdomen or pelvis.  Reproductive: Status post hysterectomy. Ovaries are not confidently identified may be surgically absent or atrophic.  Other: No significant volume of ascites.  No pneumoperitoneum.  Musculoskeletal: There are no aggressive appearing lytic or blastic lesions noted in the visualized portions of the skeleton.  VASCULAR MEASUREMENTS PERTINENT TO TAVR:  AORTA:  Minimal Aortic Diameter-11 x 11 mm  Severity of Aortic Calcification-moderate to severe  RIGHT PELVIS:  Right Common Iliac Artery -  Minimal Diameter-8.0 x 7.2 mm  Tortuosity - mild  Calcification-mild  Right External Iliac Artery -  Minimal Diameter-7.1 x 7.2 mm  Tortuosity - mild  Calcification-none  Right Common Femoral Artery -  Minimal Diameter-6.9 x 7.0 mm  Tortuosity - mild  Calcification-mild  LEFT PELVIS:  Left Common Iliac Artery -  Minimal Diameter-6.7 x 8.3 mm  Tortuosity - mild  Calcification-mild  Left External Iliac Artery -  Minimal Diameter-6.7 x 7.1 mm  Tortuosity - mild  Calcification-none  Left Common Femoral Artery -  Minimal Diameter-7.1 x 6.9 mm  Tortuosity - mild  Calcification-mild  Review of the MIP images confirms the above findings.  IMPRESSION: 1. Vascular findings and measurements pertinent to potential TAVR procedure, as detailed above. 2. Calcifications of the aortic valve, compatible with the reported clinical history of severe aortic stenosis. 3. 8 x 6 mm nodule in the lateral segment of the right middle  lobe, unchanged compared to the recent prior study from September 12, 2018. Non-contrast chest CT at 12 months is recommended. If the nodule is stable at time of repeat CT, then future CT at 18-24 months (from today's scan) is considered optional for low-risk patients, but is recommended for high-risk patients. This recommendation follows the consensus statement: Guidelines  for Management of Incidental Pulmonary Nodules Detected on CT Images: From the Fleischner Society 2017; Radiology 2017; 284:228-243. 4. Cirrhosis. 5. Additional incidental findings, as above  Laboratory Data:  High Sensitivity Troponin:  No results for input(s): TROPONINIHS in the last 720 hours.   Chemistry Recent Labs  Lab 01/01/19 0856  NA 134*  K 4.5  CL 98  CO2 26  GLUCOSE 109*  BUN 13  CREATININE 1.19*  CALCIUM 9.1  GFRNONAA 42*  GFRAA 48*  ANIONGAP 10    No results for input(s): PROT, ALBUMIN, AST, ALT, ALKPHOS, BILITOT in the last 168 hours. Hematology Recent Labs  Lab 01/05/19 0901  WBC 6.7  RBC 3.22*  HGB 9.8*  HCT 30.5*  MCV 94.7  MCH 30.4  MCHC 32.1  RDW 15.5  PLT 267   BNPNo results for input(s): BNP, PROBNP in the last 168 hours.  DDimer No results for input(s): DDIMER in the last 168 hours.   Assessment and Plan:   1. Severe aortic valve stenosis: She has severe, stage D aortic valve stenosis. I have personally reviewed the echo images. The aortic valve is thickened, calcified with limited leaflet mobility. I think she would benefit from AVR. Given advanced age, she is not a good candidate for conventional AVR by surgical approach. I think she may be a good candidate for TAVR.   I have reviewed the natural history of aortic stenosis with the patient and their family members  who are present today. We have discussed the limitations of medical therapy and the poor prognosis associated with symptomatic aortic stenosis. We have reviewed potential treatment options, including palliative  medical therapy, conventional surgical aortic valve replacement, and transcatheter aortic valve replacement. We discussed treatment options in the context of the patient's specific comorbid medical conditions.   STS Risk Score:  STS Adult Cardiac Surgery Database Version 4.20 RISK SCORES Procedure: AVR + CAB Risk of Mortality: 6.233% Renal Failure: 5.599% Permanent Stroke: 2.630% Prolonged Ventilation: 15.904% DSW Infection: 0.149% Reoperation: 4.949% Morbidity or Mortality: 25.815% Short Length of Stay: 12.378% Long Length of Stay: 16.228%  She would like to proceed with planning for TAVR. Risks and benefits of the TAVR procedure reviewed with the patient. She has completed her cardiac cath and CT imaging. She has been seen by Dr. Cyndia Bent. Will plan TAVR on 01/25/18 at [facility] with tentative planning for placement of a 23 mm Medtronic Evolut Pro THV from the femoral approach.    [facility] Cardiomyopathy Questionnaire  KCCQ-12 01/13/2019  1 a. Ability to shower/bathe Slightly limited  1 b. Ability to walk 1 block Extremely limited  1 c. Ability to hurry/jog Other, Did not do  2. Edema feet/ankles/legs Never over the past 2 weeks  3. Limited by fatigue All of the time  4. Limited by dyspnea All of the time  5. Sitting up / on 3+ pillows Every night  6. Limited enjoyment of life Extremely limited  7. Rest of life w/ symptoms Not at all satisfied  8 a. Participation in hobbies Severely limited  8 b. Participation in chores Severely limited  8 c. Visiting family/friends N/A, did not do for other reasons       For questions or updates, please contact [place] Please consult [email or web address] for contact info under     Signed, Lauree Chandler, MD  01/05/2019 2:59 PM

## 2019-01-05 NOTE — Interval H&P Note (Signed)
History and Physical Interval Note:  01/05/2019 10:04 AM  [name] [name]  has presented today for surgery, with the diagnosis of cad.  The various methods of treatment have been discussed with the patient and family. After consideration of risks, benefits and other options for treatment, the patient has consented to  Procedure(s): CORONARY STENT INTERVENTION (N/A) CORONARY ATHERECTOMY (N/A) as a surgical intervention.  The patient's history has been reviewed, patient examined, no change in status, stable for surgery.  I have reviewed the patient's chart and labs.  Questions were answered to the patient's satisfaction.   Symptom Status: Ischemic Symptoms Non-invasive Testing: Not done If no or indeterminate stress test, FFR/iFR results in all diseased vessels: Not done Diabetes Mellitus: No S/P CABG: No Antianginal therapy (number of long-acting drugs): 1 Patient undergoing renal transplant: No Patient undergoing percutaneous valve procedure: Yes   1 Vessel Disease No proximal LAD involvement, No proximal left dominant LCX involvement  PCI: Not rated  CABG: Not applicable Proximal left dominant LCX involvement  PCI: Not rated  CABG: Not applicable Proximal LAD involvement  PCI: Not rated  CABG: Not applicable  2 Vessel Disease No proximal LAD involvement  PCI: Not rated  CABG: Not applicable Proximal LAD involvement  PCI: Not rated  CABG: Not applicable  3 Vessel Disease Low disease complexity (e.g., focal stenoses, SYNTAX <=22)  PCI: Not rated  CABG: Not applicable Intermediate or high disease complexity (e.g., SYNTAX >=23)  PCI: Not rated  CABG: Not applicable  Left Main Disease Isolated LMCA disease: ostial or midshaft  PCI: Not rated  CABG: Not applicable Isolated LMCA disease: bifurcation involvement  PCI: Not rated  CABG: Not applicable LMCA ostial or midshaft, concurrent low disease burden multivessel disease (e.g., 1-2 additional focal stenoses, SYNTAX  <=22)  PCI: Not rated  CABG: Not applicable LMCA ostial or midshaft, concurrent intermediate or high disease burden multivessel disease (e.g., 1-2 additional bifurcation stenoses, long stenoses, SYNTAX >=23)  PCI: Not rated  CABG: Not applicable LMCA bifurcation involvement, concurrent low disease burden multivessel disease (e.g., 1-2 additional focal stenoses, SYNTAX <=22)  PCI: Not rated  CABG: Not applicable LMCA bifurcation involvement, concurrent intermediate or high disease burden multivessel disease (e.g., 1-2 additional bifurcation stenoses, long stenoses, SYNTAX >=23)  PCI: Not rated  CABG: Not applicable  Notes:  A indicates appropriate. M indicates may be appropriate. R indicates rarely appropriate. Number in parentheses is median score for that indication. Reclassify indicates number of functionally diseased vessels should be decreased given negative FFR/iFR. Re-evaluate the scenario interpreting any FFR/iFR negative vessel as being not significantly stenosed.  Disease means involved vessel provides flow to a sufficient amount of myocardium to be clinically important.  If FFR testing indicates a vessel is not significant, that vessel should not be considered diseased (and the patient should be reclassified with respect to extent of functionally significant disease).  Proximal LAD + proximal left dominant LCX is considered 3 vessel CAD  2 Vessel CAD with FFR/iFR abnormal in only 1 but not both is considered 1 vessel CAD  Disease complexity includes occlusion, bifurcation, trifurcation, ostial, >20 mm, tortuosity, calcification, thrombus  LMCA disease is >=50% by angiography, MLD <2.8 mm, MLA <6 mm2; MLA 6-7.5 mm2 requires further physiologic  See Table B for risk stratification based on noninvasive testing  Journal of the [employer] of Cardiology Mar 2017, 23391; DOI:  10.1016/[email or web address] [email or web address].full-text.[email or web address] This App  2018 by the Society for Cardiovascular Angiography and Interventions   Adrian Prows

## 2019-01-06 DIAGNOSIS — I350 Nonrheumatic aortic (valve) stenosis: Secondary | ICD-10-CM | POA: Diagnosis not present

## 2019-01-06 DIAGNOSIS — I2584 Coronary atherosclerosis due to calcified coronary lesion: Secondary | ICD-10-CM | POA: Diagnosis not present

## 2019-01-06 DIAGNOSIS — Z9861 Coronary angioplasty status: Secondary | ICD-10-CM | POA: Diagnosis not present

## 2019-01-06 DIAGNOSIS — I25118 Atherosclerotic heart disease of native coronary artery with other forms of angina pectoris: Secondary | ICD-10-CM | POA: Diagnosis not present

## 2019-01-06 DIAGNOSIS — R0600 Dyspnea, unspecified: Secondary | ICD-10-CM | POA: Diagnosis not present

## 2019-01-06 DIAGNOSIS — I2510 Atherosclerotic heart disease of native coronary artery without angina pectoris: Secondary | ICD-10-CM | POA: Diagnosis not present

## 2019-01-06 LAB — HEMOGLOBIN AND HEMATOCRIT, BLOOD
HCT: 27.8 % — ABNORMAL LOW (ref 36.0–46.0)
Hemoglobin: 8.8 g/dL — ABNORMAL LOW (ref 12.0–15.0)

## 2019-01-06 LAB — BASIC METABOLIC PANEL
Anion gap: 9 (ref 5–15)
BUN: 11 mg/dL (ref 8–23)
CO2: 22 mmol/L (ref 22–32)
Calcium: 8 mg/dL — ABNORMAL LOW (ref 8.9–10.3)
Chloride: 106 mmol/L (ref 98–111)
Creatinine, Ser: 1.12 mg/dL — ABNORMAL HIGH (ref 0.44–1.00)
GFR calc Af Amer: 52 mL/min — ABNORMAL LOW (ref >60)
GFR calc non Af Amer: 45 mL/min — ABNORMAL LOW (ref >60)
Glucose, Bld: 103 mg/dL — ABNORMAL HIGH (ref 70–99)
Potassium: 3.6 mmol/L (ref 3.5–5.1)
Sodium: 137 mmol/L (ref 135–145)

## 2019-01-06 LAB — RETICULOCYTES
Immature Retic Fract: 15 % (ref 2.3–15.9)
RBC.: 2.94 MIL/uL — ABNORMAL LOW (ref 3.87–5.11)
Retic Count, Absolute: 68.2 10*3/uL (ref 19.0–186.0)
Retic Ct Pct: 2.3 % (ref 0.4–3.1)

## 2019-01-06 LAB — IRON AND TIBC
Iron: 49 ug/dL (ref 28–170)
Saturation Ratios: 13 % (ref 10.4–31.8)
TIBC: 384 ug/dL (ref 250–450)
UIBC: 335 ug/dL

## 2019-01-06 LAB — CBC
HCT: 24.4 % — ABNORMAL LOW (ref 36.0–46.0)
Hemoglobin: 7.9 g/dL — ABNORMAL LOW (ref 12.0–15.0)
MCH: 29.7 pg (ref 26.0–34.0)
MCHC: 32.4 g/dL (ref 30.0–36.0)
MCV: 91.7 fL (ref 80.0–100.0)
Platelets: 216 10*3/uL (ref 150–400)
RBC: 2.66 MIL/uL — ABNORMAL LOW (ref 3.87–5.11)
RDW: 15.3 % (ref 11.5–15.5)
WBC: 8.1 10*3/uL (ref 4.0–10.5)
nRBC: 0 % (ref 0.0–0.2)

## 2019-01-06 LAB — OCCULT BLOOD X 1 CARD TO LAB, STOOL: Fecal Occult Bld: NEGATIVE

## 2019-01-06 LAB — FOLATE: Folate: 17.8 ng/mL (ref >5.9)

## 2019-01-06 LAB — VITAMIN B12: Vitamin B-12: 462 pg/mL (ref 180–914)

## 2019-01-06 LAB — FERRITIN: Ferritin: 23 ng/mL (ref 11–307)

## 2019-01-06 NOTE — Progress Notes (Signed)
CARDIAC REHAB PHASE I   PRE:  Rate/Rhythm: 98 SR  BP:  Supine:   Sitting: 134/70  Standing:    SaO2: 97%RA  MODE:  Ambulation: 470 ft   POST:  Rate/Rhythm: 106 ST  BP:  Supine:   Sitting: 148/73  Standing:    SaO2: 96%RA 0930-1030 Pt walked 470 ft on RA pushing IV pole with steady gait. Pt did state she was a little SOB by end of walk. No chest tightness. To recliner. Gave pt stent card and discussed importance of being on an antiplatelet. Gave heart healthy diet, walking for ex, reviewed NTG use, and discussed CRP 2. Encouraged pt to listen to her body with walking as she is for tentative valve replacement in future. Referral to [place] CRP 2 since pt had stent but pt knows she will not attend until after valve surgery. Pt is interested in APP at that time. Pt is interested in participating in [facility]. Pt advised that Virtual Cardiac and Pulmonary Rehab is provided at no cost to the patient.  Checklist:  1. Pt has smart device  ie smartphone and/or ipad for downloading an app  Yes 2. Reliable internet/wifi service    Yes 3. Understands how to use their smartphone and navigate within an app.  Yes   Pt verbalized understanding and is in agreement.    Graylon Good, RN BSN  01/06/2019 10:26 AM

## 2019-01-06 NOTE — Discharge Summary (Signed)
Physician Discharge Summary  Patient ID: MILEYA GIRONDA MRN: [id number] DOB/AGE: 83/04/1931 83 y.o.  Admit date: 01/05/2019 Discharge date: 01/06/2019  Primary Discharge Diagnosis 1.  Severe aortic stenosis 2.  Coronary artery disease of native vessel with dyspnea, S/P orbital atherectomy followed by stenting of proximal LAD with 3.5 x 18 mm resolute DES, Atherectomy followed by balloon angioplasty of D1.  Significant Diagnostic Studies: Left Heart Catheterization 01/05/19:  Successful orbital atherectomy followed by stenting of the proximal LAD with a 3.0 x 18 mm resolute Onyx DES and proximal segment postdilated with a 3.5 x 8 mm Sapphire .  Stenosis 90% to 0%.  TIMI-3 to TIMI-3 flow.  Orbital atherectomy followed by balloon angioplasty with 2.5 x 15 mm Sapphire balloon for D1, 90% to less than 15 to 20% stenosis.  TIMI-3 to TIMI-3 flow.  Hospital Course:  Patient is scheduled for elective TAVR, but has high-grade proximal LAD stenosis, Hence admitted for elective PCI.  Patient had successful procedure, however had difficulty with right radial arterial access and had developed right forearm hematoma.  Hence was kept overnight for observation.  She did have severe anemia, guaiac was negative for GI bleed, and as hemoglobin has chronically been low, she was felt that she is stable for discharge.  She was only started on Plavix, aspirin was not started as she has anaphylaxis to aspirin.  High bleeding risk with addition of anticoagulants hence was not performed.    Recommendations on discharge: She will follow-up with TAVR team as scheduled.  Discharge Exam: Blood pressure 119/73, pulse 86, temperature 98.4 F (36.9 C), temperature source Oral, resp. rate 18, height 5' (1.524 m), weight 55.1 kg, SpO2 98 %.   Physical Exam  Constitutional: She appears well-developed and well-nourished. No distress.  HENT:  Head: Atraumatic.  Eyes: Conjunctivae are normal.  Neck: No thyromegaly present.   Cardiovascular: Normal rate, regular rhythm, S1 normal, S2 normal and intact distal pulses. Exam reveals no gallop.  Murmur heard.  Harsh midsystolic murmur is present with a grade of 3/6 at the upper right sternal border radiating to the neck. Pulses:      Carotid pulses are on the right side with bruit and on the left side with bruit.      Popliteal pulses are 2+ on the right side and 2+ on the left side.       Dorsalis pedis pulses are 2+ on the right side and 2+ on the left side.       Posterior tibial pulses are 2+ on the right side and 2+ on the left side.  No JVD. Right forearm small hematoma noted.  Right groin without complications  Pulmonary/Chest: Effort normal and breath sounds normal.  Abdominal: Soft. Bowel sounds are normal.  Musculoskeletal:        General: Normal range of motion.     Cervical back: Neck supple.  Neurological: She is alert.  Skin: Skin is warm and dry.  Psychiatric: She has a normal mood and affect.   Labs:   Lab Results  Component Value Date   WBC 8.1 01/06/2019   HGB 8.8 (L) 01/06/2019   HCT 27.8 (L) 01/06/2019   MCV 91.7 01/06/2019   PLT 216 01/06/2019    Recent Labs  Lab 01/06/19 0415  NA 137  K 3.6  CL 106  CO2 22  BUN 11  CREATININE 1.12*  CALCIUM 8.0*  GLUCOSE 103*    Lipid Panel     Component Value Date/Time  CHOL 194 06/25/2012 0625   TRIG 98 06/25/2012 0625   HDL 56 06/25/2012 0625   CHOLHDL 3.5 06/25/2012 0625   VLDL 20 06/25/2012 0625   LDLCALC 118 (H) 06/25/2012 0625    BNP (last 3 results) Recent Labs    09/12/18 1327  BNP 105.4*    HEMOGLOBIN A1C Lab Results  Component Value Date   HGBA1C 5.6 06/25/2012   MPG 114 06/25/2012    Cardiac Panel (last 3 results) No results for input(s): CKTOTAL, CKMB, TROPONINI, RELINDX in the last 8760 hours.  No results found for: CKTOTAL, CKMB, CKMBINDEX, TROPONINI   TSH Recent Labs    09/12/18 1640 12/11/18 1027  TSH 0.015* 94.200*    Radiology: CARDIAC  CATHETERIZATION  Result Date: 01/05/2019 Left Heart Catheterization 01/05/19: Successful orbital atherectomy followed by stenting of the proximal LAD with a 3.0 x 18 mm resolute Onyx DES and proximal segment postdilated with a 3.5 x 8 mm Sapphire [place].  Stenosis 90% to 0%.  TIMI-3 to TIMI-3 flow.  Orbital atherectomy followed by balloon angioplasty with 2.5 x 15 mm Sapphire balloon for D1, 90% to less than 15 to 20% stenosis.  TIMI-3 to TIMI-3 flow. Recommendation: Patient will be observed overnight in view of right forearm hematoma.  She also has had right groin access which is closed with minx and will be observed for any hematoma development as she had mild oozing which was extracted in the lab. She will be scheduled for TAVR as per structural heart disease team.  120 mL contrast utilized.  CARDIAC CATHETERIZATION  Result Date: 12/15/2018 Right and left heart catheterization 12/15/2018: RA 5/5, mean 5; RV 31/3, EDP 6; PA 32/14, mean 21, PA saturation 67%; PW 11/10, mean 10 mmHg.  CO 3.84, CI  2.5756. QP/QS 1.0.  Normal right heart catheterization. Mildly elevated LVEDP at 15 mmHg.  Peak aortic valve gradient of 27, mean 23.6 mmHg with calculated aortic valve area 0.76 cm.  Findings consistent with severe calcific degenerative aortic valve stenosis. Left dominant circulation, proximal LAD 80% stenosis, large D1 with ostial 90% stenosis, medium sized RI with a proximal 90% stenosis, dominant circumflex mild disease.  RCA mild disease and nondominant. Recommendation: Patient will need to be evaluated for CABG and aortic valve replacement.  If the TAVR team feels she is a good candidate for TAVR, we could certainly consider either medical management of CAD or PCI to proximal LAD and TAVR.  Patient be discharged home today with outpatient follow-up.  Patient has been referred to be evaluated by Dr. Gilford Raid. 30 mL contrast utilized.  CT CORONARY MORPH W/CTA COR W/SCORE W/CA W/CM &/OR WO/CM  Addendum  Date: 01/01/2019   ADDENDUM REPORT: 01/01/2019 20:08 CLINICAL DATA:  Severe Aortic Stenosis. EXAM: Cardiac TAVR CT TECHNIQUE: The patient was scanned on a [employer]. A 120 kV retrospective scan was triggered in the descending thoracic aorta at 111 HU's. Gantry rotation speed was 250 msecs and collimation was .6 mm. No beta blockade or nitro were given. The 3D data set was reconstructed in 10% intervals of the R-R cycle. Systolic and diastolic phases were analyzed on a dedicated work station using MPR, MIP and VRT modes. The patient received 80 cc of contrast. FINDINGS: Image quality: Excellent. Noise artifact is: Limited. Valve Morphology: Severely calcified/thickened aortic valve. Tricuspid with functional fusion of the RCC/NCC. Leaflet motion of the RCC/NCC is severely restricted. Aortic Valve area: 0.63 cm2 Aortic Valve Calcium score: 1333 AU Aortic annular dimension: Phase assessed: 20%  Annular area: 3.03 cm2 Annular perimeter: 62.3 mm Max diameter: 20.4 mm Min diameter: 19.6 mm Annular/Subannular calcification: No annular calcification and no subannular calcification. Optimal coplanar projection: LAO 25 CAU 5 Coronary Artery Height above Annulus: Left Main: 14.7 mm Right Coronary: 15.4 mm Sinus of Valsalva Measurements: Non-coronary: 24.5 mm Right-coronary: 24.2 mm Left-coronary: 24.3 mm Sinotubular Junction: 25.6 mm with mild calcifications. Sinotubular Junction Height: 16.3 mm Ascending Thoracic Aorta: 29 mm without significant calcifications. Coronary arteries: Study performed without NTG, and complete assessment not made. Would refer to recent cardiac cath for coronary assessment. Coronary calcium score: 594 (78th percentile for age- and sex-matched controls). Left main: The left main is a large caliber vessel with a normal take off from the left coronary cusp that trifurcates into a LAD, LCX, and ramus intermedius. There is no plaque or stenosis. Left anterior descending artery: The proximal  LAD contains heavily calcified plaque of indeterminate severity. Ramus intermedius: Moderate to severe proximal stenosis. Left circumflex artery: The LCX is dominant without evidence of proximal stenosis. Right coronary artery: The RCA is non-dominant. Cardiac Morphology: Right Atrium: Right atrial size is within normal limits. Right Ventricle: The right ventricular cavity is within normal limits. Left Atrium: Left atrial size is normal in size with no left atrial appendage filling defect. Left Ventricle: The ventricular cavity size is within normal limits. Sigmoid septum noted. There are no stigmata of prior infarction. There is no abnormal filling defect. Hyperdynamic LV function, EF estimated to be 85%. No regional wall motion abnormalities. Pulmonary arteries: Normal in size without proximal filling defect. Pulmonary veins: Normal pulmonary venous drainage. There were 3 noted pulmonary veins, 2 on the right and 1 on the left. Pericardium: Normal thickness with no significant effusion or calcium present. Mitral Valve: The mitral valve is thickened with moderate annular calcification. Extra-cardiac findings: Very large hiatal hernia. Tortuous descending aorta. See attached radiology report for non-cardiac structures. IMPRESSION: 1. Severe aortic stenosis. 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 2. Annular area 3.03 cm2 (perimeter 62.3 mm). No significant annular/subannular calcification noted. 3. Sufficient coronary to annulus distance. 4. Optimum Fluoroscopic Angle for Delivery: LAO 25 CAU 5 Eleonore Chiquito, MD Electronically Signed   By: Eleonore Chiquito   On: 01/01/2019 20:08   Result Date: 01/01/2019 EXAM: OVER-READ INTERPRETATION  CT CHEST The following report is an over-read performed by radiologist Dr. Vinnie Langton of [facility], [place] on 01/01/2019. This over-read does not include interpretation of cardiac or coronary anatomy or pathology. The coronary calcium score/coronary  CTA interpretation by the cardiologist is attached. COMPARISON:  Chest CTA 09/12/2018. FINDINGS: Extracardiac findings will be described separately under dictation for contemporaneously obtained CTA chest, abdomen and pelvis. IMPRESSION: Please see separate dictation for contemporaneously obtained CTA chest, abdomen and pelvis dated 01/01/2019. Electronically Signed: By: Vinnie Langton M.D. On: 01/01/2019 11:40   CT ANGIO CHEST AORTA W &/OR WO CONTRAST  Result Date: 01/01/2019 CLINICAL DATA:  85 year old female with history of severe aortic stenosis. Preprocedural study prior to potential transcatheter aortic valve replacement (TAVR) procedure. EXAM: CT ANGIOGRAPHY CHEST, ABDOMEN AND PELVIS TECHNIQUE: Multidetector CT imaging through the chest, abdomen and pelvis was performed using the standard protocol during bolus administration of intravenous contrast. Multiplanar reconstructed images and MIPs were obtained and reviewed to evaluate the vascular anatomy. CONTRAST:  160mL OMNIPAQUE IOHEXOL 350 MG/ML SOLN COMPARISON:  Chest CTA 09/12/2018. FINDINGS: CTA CHEST FINDINGS Cardiovascular: Heart size is normal. There is no significant pericardial fluid, thickening or pericardial calcification. There  is aortic atherosclerosis, as well as atherosclerosis of the great vessels of the mediastinum and the coronary arteries, including calcified atherosclerotic plaque in the left main, left anterior descending and right coronary arteries. Thickening calcification of the aortic valve. Calcifications of the mitral annulus. Mediastinum/Lymph Nodes: No pathologically enlarged mediastinal or hilar lymph nodes. Large hiatal hernia. No axillary lymphadenopathy. Lungs/Pleura: 8 x 6 mm nodule in the lateral segment of the right middle lobe (axial image 72 of series 8), similar to prior study 09/12/2018. No other larger more suspicious appearing pulmonary nodules or masses are noted. No acute consolidative airspace disease. No  pleural effusions. Musculoskeletal/Soft Tissues: Chronic compression fracture of T12 with 40% loss of anterior vertebral body height. There are no aggressive appearing lytic or blastic lesions noted in the visualized portions of the skeleton. CTA ABDOMEN AND PELVIS FINDINGS Hepatobiliary: Liver has a shrunken appearance and nodular contour, indicative of underlying cirrhosis. No suspicious cystic or solid hepatic lesions. No intra or extrahepatic biliary ductal dilatation. Status post cholecystectomy. Pancreas: No pancreatic mass. No pancreatic ductal dilatation. No pancreatic or peripancreatic fluid collections or inflammatory changes. Spleen: Unremarkable. Adrenals/Urinary Tract: Subcentimeter low-attenuation lesion in the lateral aspect of the interpolar region of the right kidney, too small to characterize, but statistically likely to represent a cyst. Left kidney and bilateral adrenal glands are normal in appearance. No hydroureteronephrosis. Urinary bladder is normal in appearance. Stomach/Bowel: Stomach is nearly completely intrathoracic. No pathologic dilatation of small bowel or colon. Normal appendix. Vascular/Lymphatic: Aortic atherosclerosis, with vascular findings and measurements pertinent to potential TAVR procedure, as detailed below. No lymphadenopathy noted in the abdomen or pelvis. Reproductive: Status post hysterectomy. Ovaries are not confidently identified may be surgically absent or atrophic. Other: No significant volume of ascites.  No pneumoperitoneum. Musculoskeletal: There are no aggressive appearing lytic or blastic lesions noted in the visualized portions of the skeleton. VASCULAR MEASUREMENTS PERTINENT TO TAVR: AORTA: Minimal Aortic Diameter-11 x 11 mm Severity of Aortic Calcification-moderate to severe RIGHT PELVIS: Right Common Iliac Artery - Minimal Diameter-8.0 x 7.2 mm Tortuosity - mild Calcification-mild Right External Iliac Artery - Minimal Diameter-7.1 x 7.2 mm Tortuosity - mild  Calcification-none Right Common Femoral Artery - Minimal Diameter-6.9 x 7.0 mm Tortuosity - mild Calcification-mild LEFT PELVIS: Left Common Iliac Artery - Minimal Diameter-6.7 x 8.3 mm Tortuosity - mild Calcification-mild Left External Iliac Artery - Minimal Diameter-6.7 x 7.1 mm Tortuosity - mild Calcification-none Left Common Femoral Artery - Minimal Diameter-7.1 x 6.9 mm Tortuosity - mild Calcification-mild Review of the MIP images confirms the above findings. IMPRESSION: 1. Vascular findings and measurements pertinent to potential TAVR procedure, as detailed above. 2. Calcifications of the aortic valve, compatible with the reported clinical history of severe aortic stenosis. 3. 8 x 6 mm nodule in the lateral segment of the right middle lobe, unchanged compared to the recent prior study from September 12, 2018. Non-contrast chest CT at 12 months is recommended. If the nodule is stable at time of repeat CT, then future CT at 18-24 months (from today's scan) is considered optional for low-risk patients, but is recommended for high-risk patients. This recommendation follows the consensus statement: Guidelines for Management of Incidental Pulmonary Nodules Detected on CT Images: From the Fleischner Society 2017; Radiology 2017; 284:228-243. 4. Cirrhosis. 5. Additional incidental findings, as above Electronically Signed   By: Vinnie Langton M.D.   On: 01/01/2019 13:15   VAS US CAROTID  Result Date: 01/01/2019 Carotid Arterial Duplex Study Indications: Pre procedure. Performing Technologist: [place],  RVT  Examination Guidelines: A complete evaluation includes B-mode imaging, spectral Doppler, color Doppler, and power Doppler as needed of all accessible portions of each vessel. Bilateral testing is considered an integral part of a complete examination. Limited examinations for reoccurring indications may be performed as noted.  Right Carotid Findings:  +----------+--------+--------+--------+------------------+--------+           PSV cm/sEDV cm/sStenosisPlaque DescriptionComments +----------+--------+--------+--------+------------------+--------+ CCA Prox  58      12                                         +----------+--------+--------+--------+------------------+--------+ CCA Distal65      16                                         +----------+--------+--------+--------+------------------+--------+ ICA Prox  68      15                                         +----------+--------+--------+--------+------------------+--------+ ICA Distal93      22                                tortuous +----------+--------+--------+--------+------------------+--------+ ECA       81      6                                          +----------+--------+--------+--------+------------------+--------+ +----------+--------+-------+----------------+-------------------+           PSV cm/sEDV cmsDescribe        Arm Pressure (mmHG) +----------+--------+-------+----------------+-------------------+ [id number]             Multiphasic, WNL                    +----------+--------+-------+----------------+-------------------+ +---------+--------+--+--------+--+---------+ VertebralPSV cm/s37EDV cm/s11Antegrade +---------+--------+--+--------+--+---------+  Left Carotid Findings: +----------+--------+--------+--------+------------------+--------+           PSV cm/sEDV cm/sStenosisPlaque DescriptionComments +----------+--------+--------+--------+------------------+--------+ CCA Prox  92      21                                         +----------+--------+--------+--------+------------------+--------+ CCA Distal70      19                                         +----------+--------+--------+--------+------------------+--------+ ICA Prox  74      21                                          +----------+--------+--------+--------+------------------+--------+ ICA Mid   93      26                                tortuous +----------+--------+--------+--------+------------------+--------+ ICA Distal86  31                                         +----------+--------+--------+--------+------------------+--------+ ECA       75      11                                         +----------+--------+--------+--------+------------------+--------+ +----------+--------+--------+----------------+-------------------+           PSV cm/sEDV cm/sDescribe        Arm Pressure (mmHG) +----------+--------+--------+----------------+-------------------+ [id number]             Multiphasic, WNL                    +----------+--------+--------+----------------+-------------------+ +---------+--------+--+--------+-+---------+ VertebralPSV cm/s42EDV cm/s9Antegrade +---------+--------+--+--------+-+---------+  Summary: Right Carotid: Velocities in the right ICA are consistent with a 1-39% stenosis. Left Carotid: Velocities in the left ICA are consistent with a 1-39% stenosis. Vertebrals:  Bilateral vertebral arteries demonstrate antegrade flow. Subclavians: Normal flow hemodynamics were seen in bilateral subclavian              arteries. *See table(s) above for measurements and observations.  Electronically signed by Servando Snare MD on 01/01/2019 at 10:15:10 AM.    Final    CT Angio Abd/Pel w/ and/or w/o  Result Date: 01/01/2019 CLINICAL DATA:  85 year old female with history of severe aortic stenosis. Preprocedural study prior to potential transcatheter aortic valve replacement (TAVR) procedure. EXAM: CT ANGIOGRAPHY CHEST, ABDOMEN AND PELVIS TECHNIQUE: Multidetector CT imaging through the chest, abdomen and pelvis was performed using the standard protocol during bolus administration of intravenous contrast. Multiplanar reconstructed images and MIPs were obtained and reviewed to  evaluate the vascular anatomy. CONTRAST:  168mL OMNIPAQUE IOHEXOL 350 MG/ML SOLN COMPARISON:  Chest CTA 09/12/2018. FINDINGS: CTA CHEST FINDINGS Cardiovascular: Heart size is normal. There is no significant pericardial fluid, thickening or pericardial calcification. There is aortic atherosclerosis, as well as atherosclerosis of the great vessels of the mediastinum and the coronary arteries, including calcified atherosclerotic plaque in the left main, left anterior descending and right coronary arteries. Thickening calcification of the aortic valve. Calcifications of the mitral annulus. Mediastinum/Lymph Nodes: No pathologically enlarged mediastinal or hilar lymph nodes. Large hiatal hernia. No axillary lymphadenopathy. Lungs/Pleura: 8 x 6 mm nodule in the lateral segment of the right middle lobe (axial image 72 of series 8), similar to prior study 09/12/2018. No other larger more suspicious appearing pulmonary nodules or masses are noted. No acute consolidative airspace disease. No pleural effusions. Musculoskeletal/Soft Tissues: Chronic compression fracture of T12 with 40% loss of anterior vertebral body height. There are no aggressive appearing lytic or blastic lesions noted in the visualized portions of the skeleton. CTA ABDOMEN AND PELVIS FINDINGS Hepatobiliary: Liver has a shrunken appearance and nodular contour, indicative of underlying cirrhosis. No suspicious cystic or solid hepatic lesions. No intra or extrahepatic biliary ductal dilatation. Status post cholecystectomy. Pancreas: No pancreatic mass. No pancreatic ductal dilatation. No pancreatic or peripancreatic fluid collections or inflammatory changes. Spleen: Unremarkable. Adrenals/Urinary Tract: Subcentimeter low-attenuation lesion in the lateral aspect of the interpolar region of the right kidney, too small to characterize, but statistically likely to represent a cyst. Left kidney and bilateral adrenal glands are normal in appearance. No  hydroureteronephrosis. Urinary bladder is normal in appearance. Stomach/Bowel: Stomach is nearly completely  intrathoracic. No pathologic dilatation of small bowel or colon. Normal appendix. Vascular/Lymphatic: Aortic atherosclerosis, with vascular findings and measurements pertinent to potential TAVR procedure, as detailed below. No lymphadenopathy noted in the abdomen or pelvis. Reproductive: Status post hysterectomy. Ovaries are not confidently identified may be surgically absent or atrophic. Other: No significant volume of ascites.  No pneumoperitoneum. Musculoskeletal: There are no aggressive appearing lytic or blastic lesions noted in the visualized portions of the skeleton. VASCULAR MEASUREMENTS PERTINENT TO TAVR: AORTA: Minimal Aortic Diameter-11 x 11 mm Severity of Aortic Calcification-moderate to severe RIGHT PELVIS: Right Common Iliac Artery - Minimal Diameter-8.0 x 7.2 mm Tortuosity - mild Calcification-mild Right External Iliac Artery - Minimal Diameter-7.1 x 7.2 mm Tortuosity - mild Calcification-none Right Common Femoral Artery - Minimal Diameter-6.9 x 7.0 mm Tortuosity - mild Calcification-mild LEFT PELVIS: Left Common Iliac Artery - Minimal Diameter-6.7 x 8.3 mm Tortuosity - mild Calcification-mild Left External Iliac Artery - Minimal Diameter-6.7 x 7.1 mm Tortuosity - mild Calcification-none Left Common Femoral Artery - Minimal Diameter-7.1 x 6.9 mm Tortuosity - mild Calcification-mild Review of the MIP images confirms the above findings. IMPRESSION: 1. Vascular findings and measurements pertinent to potential TAVR procedure, as detailed above. 2. Calcifications of the aortic valve, compatible with the reported clinical history of severe aortic stenosis. 3. 8 x 6 mm nodule in the lateral segment of the right middle lobe, unchanged compared to the recent prior study from September 12, 2018. Non-contrast chest CT at 12 months is recommended. If the nodule is stable at time of repeat CT, then future CT at  18-24 months (from today's scan) is considered optional for low-risk patients, but is recommended for high-risk patients. This recommendation follows the consensus statement: Guidelines for Management of Incidental Pulmonary Nodules Detected on CT Images: From the Fleischner Society 2017; Radiology 2017; 284:228-243. 4. Cirrhosis. 5. Additional incidental findings, as above Electronically Signed   By: Vinnie Langton M.D.   On: 01/01/2019 13:15      FOLLOW UP PLANS AND APPOINTMENTS Discharge Instructions    Amb Referral to Cardiac Rehabilitation   Complete by: As directed    Diagnosis: Coronary Stents   After initial evaluation and assessments completed: Virtual Based Care may be provided alone or in conjunction with Phase 2 Cardiac Rehab based on patient barriers.: Yes     Allergies as of 01/06/2019      Reactions   Aspirin Anaphylaxis   Ceclor [cefaclor] Anaphylaxis   Statins Other (See Comments)   Elevated liver enzymes   Penicillins Rash   Has patient had a PCN reaction causing immediate rash, facial/tongue/throat swelling, SOB or lightheadedness with hypotension: Yes Has patient had a PCN reaction causing severe rash involving mucus membranes or skin necrosis: No Has patient had a PCN reaction that required hospitalization No Has patient had a PCN reaction occurring within the last 10 years: No If all of the above answers are "NO", then may proceed with Cephalosporin use.   Sulfa Antibiotics Rash      Medication List    TAKE these medications   atorvastatin 10 MG tablet Commonly known as: LIPITOR Take 0.5 tablets (5 mg total) by mouth daily.   benzonatate 100 MG capsule Commonly known as: TESSALON Take 1-2 capsules (100-200 mg total) by mouth 3 (three) times daily as needed.   cetirizine 10 MG tablet Commonly known as: ZYRTEC Take 10 mg by mouth daily.   cholecalciferol 25 MCG (1000 UT) tablet Commonly known as: VITAMIN D Take 1,000 Units by mouth  daily.    clopidogrel 75 MG tablet Commonly known as: PLAVIX Take 1 tablet (75 mg total) by mouth every other day. What changed: when to take this   ezetimibe 10 MG tablet Commonly known as: ZETIA Take 10 mg by mouth daily.   levothyroxine 50 MCG tablet Commonly known as: SYNTHROID Take 50 mcg by mouth daily before breakfast.   meclizine 25 MG tablet Commonly known as: ANTIVERT Take 25 mg by mouth 3 (three) times daily as needed for dizziness.   mometasone 50 MCG/ACT nasal spray Commonly known as: NASONEX Place 2 sprays into the nose every evening.   multivitamin with minerals Tabs tablet Take 1 tablet by mouth 4 (four) times a week.   propranolol 40 MG tablet Commonly known as: INDERAL Take 0.5 tablets (20 mg total) by mouth 3 (three) times daily.   sodium chloride 0.65 % Soln nasal spray Commonly known as: OCEAN Place 1 spray into both nostrils daily.   Systane 0.4-0.3 % Soln Generic drug: Polyethyl Glycol-Propyl Glycol Place 1 drop into both eyes 3 (three) times daily as needed (dry/irritated eyes.).      Follow-up Information    Adrian Prows, MD Follow up on 01/13/2019.   Specialty: Cardiology Why: Keep previous appointment Contact information: [place] [address] [phone]          Adrian Prows, MD 01/06/2019, 1:00 PM  Pager: [phone] Office: [phone]

## 2019-01-08 ENCOUNTER — Telehealth: Payer: Self-pay

## 2019-01-08 NOTE — Telephone Encounter (Signed)
Patients daughter wants to know if it ok to apply [place] gel for busing to wrist

## 2019-01-12 ENCOUNTER — Encounter: Payer: Self-pay | Admitting: Physician Assistant

## 2019-01-13 ENCOUNTER — Other Ambulatory Visit: Payer: Self-pay | Admitting: Physician Assistant

## 2019-01-13 ENCOUNTER — Other Ambulatory Visit: Payer: Self-pay

## 2019-01-13 ENCOUNTER — Ambulatory Visit: Payer: Federal, State, Local not specified - PPO | Admitting: Cardiology

## 2019-01-13 ENCOUNTER — Encounter: Payer: Self-pay | Admitting: Physician Assistant

## 2019-01-13 ENCOUNTER — Encounter: Payer: Self-pay | Admitting: Cardiology

## 2019-01-13 VITALS — BP 122/64 | HR 70 | Ht 60.0 in | Wt 121.7 lb

## 2019-01-13 DIAGNOSIS — I2510 Atherosclerotic heart disease of native coronary artery without angina pectoris: Secondary | ICD-10-CM

## 2019-01-13 DIAGNOSIS — I350 Nonrheumatic aortic (valve) stenosis: Secondary | ICD-10-CM

## 2019-01-13 NOTE — Progress Notes (Signed)
Primary Physician/Referring:  Kelton Pillar, MD  Patient ID: [name] [name], female    DOB: 12/18/1931, 85 y.o.   MRN: [id number]  Chief Complaint  Patient presents with  . Coronary Artery Disease   HPI:    [name] [name]  is a 85 y.o. Caucasian female with history of benign positional vertigo, chronic dyspnea, chronic back pain, hypertension, mixed hyperlipidemia and  Symptomatic aortic stenosis with worsening dyspnea, underwent right and left heart catheterization on 12/15/2018 with findings consistent with severe aortic valve stenosis, 80% in the proximal LAD, large D1 with ostial 90% stenosis, medium size RI with a proximal 90% stenosis and mild circumflex and RCA disease. Underwent successful PCI to LAD/D1  on 01/05/2019.  She is tentatively scheduled for TAVR on 01/25/2019.  She underwent complex but successful atherectomy followed by stenting of the proximal LAD and atherectomy to the D1 segment of the LAD on 01/05/2019, had right arm hematoma and also right groin hematoma but did not get any bl she has recuperated well, except for continued dyspnea no other specific symptoms today.    Past Medical History:  Diagnosis Date  . Arthritis   . CAD (coronary artery disease)   . Hyperlipidemia   . Hypertension   . Lipoma    left arm  . Nasal polyps   . Pulmonary nodule    noted on pre TAVR CT  . Severe aortic stenosis    Past Surgical History:  Procedure Laterality Date  . CHOLECYSTECTOMY  02/27/04  . CORONARY ATHERECTOMY N/A 01/05/2019   Procedure: CORONARY ATHERECTOMY;  Surgeon: Adrian Prows, MD;  Location: [facility];  Service: Cardiovascular;  Laterality: N/A;  . CORONARY BALLOON ANGIOPLASTY N/A 01/05/2019   Procedure: CORONARY BALLOON ANGIOPLASTY;  Surgeon: Adrian Prows, MD;  Location: [facility];  Service: Cardiovascular;  Laterality: N/A;  . CORONARY STENT INTERVENTION N/A 01/05/2019   Procedure: CORONARY STENT INTERVENTION;  Surgeon: Adrian Prows, MD;  Location: [facility];  Service: Cardiovascular;  Laterality: N/A;  . CORONARY STENT INTERVENTION  01/05/2019  . INCONTINENCE SURGERY  09/12/09  . ORIF ANKLE FRACTURE Right 09/11/2013   Procedure: OPEN REDUCTION INTERNAL FIXATION (ORIF) ANKLE FRACTURE;  Surgeon: Augustin Schooling, MD;  Location: [facility];  Service: Orthopedics;  Laterality: Right;  . RIGHT/LEFT HEART CATH AND CORONARY ANGIOGRAPHY N/A 12/15/2018   Procedure: RIGHT/LEFT HEART CATH AND CORONARY ANGIOGRAPHY;  Surgeon: Adrian Prows, MD;  Location: [facility];  Service: Cardiovascular;  Laterality: N/A;  . ROTATOR CUFF REPAIR  09/27/2003  . TRIGGER FINGER RELEASE  10/29/06   Social History   Socioeconomic History  . Marital status: Married    Spouse name: Not on file  . Number of children: 3  . Years of education: Not on file  . Highest education level: Not on file  Occupational History  . Not on file  Tobacco Use  . Smoking status: Never Smoker  . Smokeless tobacco: Never Used  Substance and Sexual Activity  . Alcohol use: No  . Drug use: No  . Sexual activity: Not on file  Other Topics Concern  . Not on file  Social History Narrative  . Not on file   Social Determinants of Health   Financial Resource Strain:   . Difficulty of Paying Living Expenses: Not on file  Food Insecurity:   . Worried About Charity fundraiser in the Last Year: Not on file  . Ran Out of Food in the Last  Year: Not on file  Transportation Needs:   . Lack of Transportation (Medical): Not on file  . Lack of Transportation (Non-Medical): Not on file  Physical Activity:   . Days of Exercise per Week: Not on file  . Minutes of Exercise per Session: Not on file  Stress:   . Feeling of Stress : Not on file  Social Connections:   . Frequency of Communication with Friends and Family: Not on file  . Frequency of Social Gatherings with Friends and Family: Not on file  . Attends Religious Services: Not on file  . Active Member of Clubs or Organizations:  Not on file  . Attends Archivist Meetings: Not on file  . Marital Status: Not on file  Intimate Partner Violence:   . Fear of Current or Ex-Partner: Not on file  . Emotionally Abused: Not on file  . Physically Abused: Not on file  . Sexually Abused: Not on file   ROS  Review of Systems  Constitution: Negative for chills, decreased appetite, malaise/fatigue and weight gain.  Cardiovascular: Positive for dyspnea on exertion. Negative for leg swelling and syncope.  Respiratory: Positive for wheezing.   Endocrine: Negative for cold intolerance.  Hematologic/Lymphatic: Does not bruise/bleed easily.  Musculoskeletal: Positive for back pain (spinal stenosis) and joint pain (DJD diffuse). Negative for joint swelling.  Gastrointestinal: Negative for abdominal pain, anorexia, change in bowel habit, hematochezia and melena.  Neurological: Negative for headaches and light-headedness.  Psychiatric/Behavioral: Negative for depression and substance abuse.  All other systems reviewed and are negative.  Objective  Blood pressure 122/64, pulse 70, height 5' (1.524 m), weight 121 lb 11.2 oz (55.2 kg), SpO2 99 %. Body mass index is 23.77 kg/m.   Physical Exam  Constitutional: She appears well-developed and well-nourished. No distress.  HENT:  Head: Atraumatic.  Eyes: Conjunctivae are normal.  Neck: No thyromegaly present.  Cardiovascular: Normal rate, regular rhythm, S1 normal, S2 normal and intact distal pulses. Exam reveals no gallop.  Murmur heard.  Harsh midsystolic murmur is present with a grade of 3/6 at the upper right sternal border radiating to the neck. Pulses:      Carotid pulses are on the right side with bruit and on the left side with bruit.      Popliteal pulses are 2+ on the right side and 2+ on the left side.       Dorsalis pedis pulses are 2+ on the right side and 2+ on the left side.       Posterior tibial pulses are 2+ on the right side and 2+ on the left side.  No  leg edema.  No JVD.  Ecchymosis of the right forearm and right groin noted.   Pulmonary/Chest: Effort normal and breath sounds normal.  Abdominal: Soft. Bowel sounds are normal.  Musculoskeletal:        General: Normal range of motion.     Cervical back: Neck supple.  Neurological: She is alert.  Skin: Skin is warm and dry.  Psychiatric: She has a normal mood and affect.   Radiology: No results found. Laboratory examination:   Recent Labs    12/11/18 1027 01/01/19 0856 01/05/19 1018 01/06/19 0415  NA 136 134* 135 137  K 4.4 4.5 4.0 3.6  CL 98 98 101 106  CO2 21 26  --  22  GLUCOSE 107* 109* 100* 103*  BUN '13 13 16 11  ' CREATININE 1.36* 1.19* 1.00 1.12*  CALCIUM 9.3 9.1  --  8.0*  GFRNONAA 36* 42*  --  45*  GFRAA 41* 48*  --  52*   CMP Latest Ref Rng & Units 01/06/2019 01/05/2019 01/01/2019  Glucose 70 - 99 mg/dL 103(H) 100(H) 109(H)  BUN 8 - 23 mg/dL '11 16 13  ' Creatinine 0.44 - 1.00 mg/dL 1.12(H) 1.00 1.19(H)  Sodium 135 - 145 mmol/L 137 135 134(L)  Potassium 3.5 - 5.1 mmol/L 3.6 4.0 4.5  Chloride 98 - 111 mmol/L 106 101 98  CO2 22 - 32 mmol/L 22 - 26  Calcium 8.9 - 10.3 mg/dL 8.0(L) - 9.1  Total Protein 6.5 - 8.1 g/dL - - -  Total Bilirubin 0.3 - 1.2 mg/dL - - -  Alkaline Phos 38 - 126 U/L - - -  AST 15 - 41 U/L - - -  ALT 0 - 44 U/L - - -   CBC Latest Ref Rng & Units 01/06/2019 01/06/2019 01/05/2019  WBC 4.0 - 10.5 K/uL - 8.1 -  Hemoglobin 12.0 - 15.0 g/dL 8.8(L) 7.9(L) 9.9(L)  Hematocrit 36.0 - 46.0 % 27.8(L) 24.4(L) 29.0(L)  Platelets 150 - 400 K/uL - 216 -   Lipid Panel   07/25/2017: Hemoglobin A1c 9%. Creatinine 1.16, EGFR 54, potassium 4.5, alkaline loss 420, AST 55, CMP otherwise normal. ( PCP has held Zetia and simvastatin for 2 months due to elevated LFTs). Hemoglobin 11.8.   Cholesterol 157, triglycerides 105, HDL 60, LDL 78.  05/27/2017: Cholesterol 208, triglycerides 84, HDL 81, LDL 110.     Component Value Date/Time   CHOL 194 06/25/2012 0625    TRIG 98 06/25/2012 0625   HDL 56 06/25/2012 0625   CHOLHDL 3.5 06/25/2012 0625   VLDL 20 06/25/2012 0625   LDLCALC 118 (H) 06/25/2012 0625   HEMOGLOBIN A1C Lab Results  Component Value Date   HGBA1C 5.6 06/25/2012   MPG 114 06/25/2012   TSH Recent Labs    09/12/18 1640 12/11/18 1027  TSH 0.015* 94.200*   Medications   Prior to Admission medications   Medication Sig Start Date End Date Taking? Authorizing Provider  alendronate (FOSAMAX) 70 MG tablet  12/23/17  Yes [provider]  Calcium Carb-Cholecalciferol (CALCIUM 1000 + D PO) Take 1 tablet by mouth daily.   Yes [provider]  cetirizine (ZYRTEC) 10 MG tablet Take 10 mg by mouth daily.   Yes [provider]  clopidogrel (PLAVIX) 75 MG tablet Take 1 tablet (75 mg total) by mouth every other day. 06/08/18  Yes Adrian Prows, MD  ezetimibe (ZETIA) 10 MG tablet Take 10 mg by mouth at bedtime.    Yes [provider]  meclizine (ANTIVERT) 25 MG tablet Take 25 mg by mouth 3 (three) times daily as needed for dizziness.   Yes [provider]  metoprolol succinate (TOPROL-XL) 50 MG 24 hr tablet Take 50 mg by mouth daily. 06/11/18  Yes [provider]  mometasone (NASONEX) 50 MCG/ACT nasal spray Place 2 sprays into the nose daily.   Yes [provider]  Multiple Vitamin (MULTIVITAMIN WITH MINERALS) TABS tablet Take 1 tablet by mouth daily.   Yes [provider]  nitrofurantoin, macrocrystal-monohydrate, (MACROBID) 100 MG capsule Take 1 capsule (100 mg total) by mouth 2 (two) times daily. X 7 days 09/12/18  Yes Pfeiffer, Jeannie Done, MD  benzonatate (TESSALON) 100 MG capsule Take 1-2 capsules (100-200 mg total) by mouth 3 (three) times daily as needed. Patient not taking: Reported on 09/14/2018 08/23/18   Jaynee Eagles, PA-C     Current Outpatient  Medications  Medication Instructions  . atorvastatin (LIPITOR) 5 mg, Oral, Daily  . benzonatate (TESSALON) 100-200 mg, Oral, 3 times  daily PRN  . cetirizine (ZYRTEC) 10 mg, Daily  . cholecalciferol (VITAMIN D) 1,000 Units, Oral, Daily  . clopidogrel (PLAVIX) 75 mg, Oral, Every other day  . ezetimibe (ZETIA) 10 mg, Oral, Daily  . levothyroxine (SYNTHROID) 50 mcg, Oral, Daily before breakfast  . meclizine (ANTIVERT) 25 mg, Oral, 3 times daily PRN  . mometasone (NASONEX) 50 MCG/ACT nasal spray 2 sprays, Nasal, Every evening  . Multiple Vitamin (MULTIVITAMIN WITH MINERALS) TABS tablet 1 tablet, Oral, 4 times weekly  . Polyethyl Glycol-Propyl Glycol (SYSTANE) 0.4-0.3 % SOLN 1 drop, Both Eyes, 3 times daily PRN  . propranolol (INDERAL) 20 mg, Oral, 3 times daily  . sodium chloride (OCEAN) 0.65 % SOLN nasal spray 1 spray, Each Nare, Daily    Cardiac Studies:    Exercise myoview stress 04/25/2017: 1. The patient performed treadmill exercise using a Bruce protocol, completing 5 minutes. The patient completed an estimated workload of 4.88 METS, reaching 103% of the maximum predicted heart rate. Stress symptoms included dyspnea. Exercise capacity fair for age. Normal hemodynamic response. Possible old anteroseptal infarct. Poor R wave progression. No ischemic changes on stress electrocardiogram. 2. The overall quality of the study is excellent. There is no evidence of abnormal lung activity. Stress and rest SPECT images demonstrate homogeneous tracer distribution throughout the myocardium. Gated SPECT imaging reveals normal myocardial thickening and wall motion. The left ventricular ejection fraction was normal (77%). 3. Low risk study.  Carotid artery duplex 11/13/2017: Minimal stenosis in the right internal carotid artery (1-15%). Stenosis in the left internal carotid artery (16-49%). Antegrade right vertebral artery flow. Antegrade left vertebral artery flow. Follow up in one year is appropriate if clinically indicated. Compared to 05/14/2017, Left carotid stenosis severity decreased from >50%.  Echocardiogram 10/01/2018: Left  ventricle cavity is normal in size. Mild concentric hypertrophy of the left ventricle. Normal LV systolic function with EF 59%. Normal global wall motion. Doppler evidence of grade II (pseudonormal) diastolic dysfunction, elevated LAP.  Trileaflet aortic valve. Moderate calcification of the aortic valve annulus and leaflets. At least moderate aortic stenosis. Moderate (Grade II) aortic regurgitation. Aortic valve mean gradient of 32 mmHg, Vmax of 3.6  m/s. Calculated aortic valve area by continuity equation is 0.6 cm. Dimensionless index is 0.21. Findings suggest paradoxically low flow low gradient severe aortic stenosis. Consider clinical correlation and further workup. Mild calcification of mitral leaflets. Mild restriction without significant stenosis.  Moderate (Grade II) mitral regurgitation. Mild to moderate tricuspid regurgitation. Estimated pulmonary artery systolic pressure is 35 mmHg.  Compared to the study done on 05/14/2017, peak gradient was 38 and mean gradient was 20 mmHg and aortic valve area of 0.73 cm by continuity, aortic stenosis is increased in severity.   Right and left heart catheterization 12/15/2018: RA 5/5, mean 5; RV 31/3, EDP 6; PA 32/14, mean 21, PA saturation 67%; PW 11/10, mean 10 mmHg.  CO 3.84, CI  2.5756. QP/QS 1.0.  Normal right heart catheterization. Mildly elevated LVEDP at 15 mmHg.  Peak aortic valve gradient of 27, mean 23.6 mmHg with calculated aortic valve area 0.76 cm.  Findings consistent with severe calcific degenerative aortic valve stenosis. Left dominant circulation, proximal LAD 80% stenosis, large D1 with ostial 90% stenosis, medium sized RI with a proximal 90% stenosis, dominant circumflex mild disease.  RCA mild disease and nondominant.  Recommendation: Patient will need to be evaluated for  CABG and aortic valve replacement.  If the TAVR team feels she is a good candidate for TAVR, we could certainly consider either medical management of CAD or PCI  to proximal LAD and TAVR. Patient be discharged home today with outpatient follow-up.  Patient has been referred to be evaluated by Dr. Gilford Raid. 30 mL contrast utilized.  Left Heart Catheterization 01/05/19:  Successful orbital atherectomy followed by stenting of the proximal LAD with a 3.0 x 18 mm resolute Onyx DES and proximal segment postdilated with a 3.5 x 8 mm Sapphire [place].  Stenosis 90% to 0%.  TIMI-3 to TIMI-3 flow.  Orbital atherectomy followed by balloon angioplasty with 2.5 x 15 mm Sapphire balloon for D1, 90% to less than 15 to 20% stenosis.  TIMI-3 to TIMI-3 flow.  Recommendation: Patient will be observed overnight in view of right forearm hematoma.  She also has had right groin access which is closed with minx and will be observed for any hematoma development as she had mild oozing which was extracted in the lab.  She will be scheduled for TAVR as per structural heart disease team.  120 mL contrast utilized.  Assessment     ICD-10-CM   1. Coronary artery disease involving native coronary artery of native heart without angina pectoris  I25.10 EKG 12-Lead    EKG 01/13/2019: Normal sinus rhythm at rate of 66 bpm, normal axis.  Anteroseptal infarct old.  Frequent PACs.  Baseline artifact.  Nonspecific T abnormality.  EKG 12/23/2018: Normal sinus rhythm at rate of 72 beats minute, biatrial enlargement, left axis deviation, left anterior fascicular block.  Anteroseptal infarct old.  Nonspecific T abnormality.  IVCD, LVH. Compared to 11/23/2018, marked sinus bradycardia at the rate of 42 bpm not present.    Recommendations:   [name] [name]  is a 85 y.o. Caucasian female with history of benign positional vertigo, chronic dyspnea, chronic back pain, hypertension, mixed hyperlipidemia and  Symptomatic aortic stenosis with worsening dyspnea, underwent right and left heart catheterization on 12/15/2018 with findings consistent with severe aortic valve stenosis, 80% in the proximal LAD,  large D1 with ostial 90% stenosis, medium size RI with a proximal 90% stenosis and mild circumflex and RCA disease. Underwent successful PCI to LAD/D1  on 01/05/2019.  She is tentatively scheduled for TAVR on 01/25/2019.  She is presently doing well, has small right groin hematoma and extensive ecchymosis and also has a small hematoma right forearm and ecchymosis. There is no clinical evidence of heart failure, EKG except for frequent PACs is unremarkable.  I will see her back after TAVR on 01/25/2019.  Continue Plavix for now.  She is not on dual antiplatelet therapy as she is allergic to aspirin in the form of anaphylaxis and also due to bleeding risk.  Adrian Prows, MD, [facility] 01/13/2019, 2:10 PM [facility] [place] Pager: [phone] Office: [phone] If no answer Cell [phone]

## 2019-01-17 ENCOUNTER — Encounter: Payer: Self-pay | Admitting: Cardiology

## 2019-01-20 NOTE — Pre-Procedure Instructions (Signed)
[name] [name]  01/20/2019      [id number] - [place], [place] [id number] [place] [id number] Phone: [phone] Fax: [phone]  [place] ([place]), [place] - [address] [place] ([place]) [place] [address] Phone: [phone] Fax: [phone]    Your procedure is scheduled on Jan. 5  Report to [facility] entrance A at 8:00 A.M.  Call this number if you have problems the morning of surgery:  [phone]   Remember:  Do not eat or drink after midnight.      Take these medicines the morning of surgery with A SIP OF WATER :  None           7 days prior to surgery STOP taking any Aspirin (unless otherwise instructed by your surgeon), Aleve, Naproxen, Ibuprofen, Motrin, Advil, Goody's, BC's, all herbal medications, fish oil, and all vitamins.    Do not wear jewelry, make-up or nail polish.  Do not wear lotions, powders, or perfumes, or deodorant.  Do not shave 48 hours prior to surgery.  Men may shave face and neck.  Do not bring valuables to the hospital.  [facility] is not responsible for any belongings or valuables.  Contacts, dentures or bridgework may not be worn into surgery.  Leave your suitcase in the car.  After surgery it may be brought to your room.  For patients admitted to the hospital, discharge time will be determined by your treatment team.  Patients discharged the day of surgery will not be allowed to drive home.    Special instructions:  [facility]- Preparing For Surgery  Before surgery, you can play an important role. Because skin is not sterile, your skin needs to be as free of germs as possible. You can reduce the number of germs on your skin by washing with CHG (chlorahexidine gluconate) Soap before surgery.  CHG is an antiseptic cleaner which kills germs and bonds with the skin to continue killing germs even after  washing.    Oral Hygiene is also important to reduce your risk of infection.  Remember - BRUSH YOUR TEETH THE MORNING OF SURGERY WITH YOUR REGULAR TOOTHPASTE  Please do not use if you have an allergy to CHG or antibacterial soaps. If your skin becomes reddened/irritated stop using the CHG.  Do not shave (including legs and underarms) for at least 48 hours prior to first CHG shower. It is OK to shave your face.  Please follow these instructions carefully.   1. Shower the NIGHT BEFORE SURGERY and the MORNING OF SURGERY with CHG.   2. If you chose to wash your hair, wash your hair first as usual with your normal shampoo.  3. After you shampoo, rinse your hair and body thoroughly to remove the shampoo.  4. Use CHG as you would any other liquid soap. You can apply CHG directly to the skin and wash gently with a scrungie or a clean washcloth.   5. Apply the CHG Soap to your body ONLY FROM THE NECK DOWN.  Do not use on open wounds or open sores. Avoid contact with your eyes, ears, mouth and genitals (private parts). Wash Face and genitals (private parts)  with your normal soap.  6. Wash thoroughly, paying special attention to the area where your surgery will be performed.  7. Thoroughly rinse your body with warm water from the neck down.  8.  DO NOT shower/wash with your normal soap after using and rinsing off the CHG Soap.  9. Pat yourself dry with a CLEAN TOWEL.  10. Wear CLEAN PAJAMAS to bed the night before surgery, wear comfortable clothes the morning of surgery  11. Place CLEAN SHEETS on your bed the night of your first shower and DO NOT SLEEP WITH PETS.    Day of Surgery:  Do not apply any deodorants/lotions.  Please wear clean clothes to the hospital/surgery center.   Remember to brush your teeth WITH YOUR REGULAR TOOTHPASTE.    Please read over the following fact sheets that you were given. Coughing and Deep Breathing, MRSA Information and Surgical Site Infection  Prevention

## 2019-01-21 ENCOUNTER — Telehealth: Payer: Self-pay | Admitting: Physician Assistant

## 2019-01-21 ENCOUNTER — Ambulatory Visit: Payer: Federal, State, Local not specified - PPO | Attending: Cardiovascular Disease | Admitting: Physical Therapy

## 2019-01-21 ENCOUNTER — Encounter: Payer: Self-pay | Admitting: Physical Therapy

## 2019-01-21 ENCOUNTER — Encounter (HOSPITAL_COMMUNITY)
Admit: 2019-01-21 | Discharge: 2019-01-21 | Disposition: A | Discharge status: Home / Self Care | Payer: Medicare Other | Attending: Cardiovascular Disease | Admitting: Cardiovascular Disease

## 2019-01-21 ENCOUNTER — Other Ambulatory Visit: Payer: Self-pay | Admitting: Physician Assistant

## 2019-01-21 ENCOUNTER — Other Ambulatory Visit (HOSPITAL_COMMUNITY): Admit: 2019-01-21 | Payer: Medicare Other

## 2019-01-21 ENCOUNTER — Encounter (HOSPITAL_COMMUNITY): Payer: Self-pay

## 2019-01-21 ENCOUNTER — Ambulatory Visit (HOSPITAL_COMMUNITY)
Admission: RE | Admit: 2019-01-21 | Discharge: 2019-01-21 | Disposition: A | Discharge status: Home / Self Care | Payer: Medicare Other | Source: Ambulatory Visit | Attending: Physician Assistant | Admitting: Physician Assistant

## 2019-01-21 ENCOUNTER — Other Ambulatory Visit: Payer: Self-pay

## 2019-01-21 DIAGNOSIS — Z01818 Encounter for other preprocedural examination: Secondary | ICD-10-CM | POA: Diagnosis not present

## 2019-01-21 DIAGNOSIS — Z79899 Other long term (current) drug therapy: Secondary | ICD-10-CM | POA: Insufficient documentation

## 2019-01-21 DIAGNOSIS — E785 Hyperlipidemia, unspecified: Secondary | ICD-10-CM | POA: Insufficient documentation

## 2019-01-21 DIAGNOSIS — I2510 Atherosclerotic heart disease of native coronary artery without angina pectoris: Secondary | ICD-10-CM | POA: Diagnosis not present

## 2019-01-21 DIAGNOSIS — I10 Essential (primary) hypertension: Secondary | ICD-10-CM | POA: Insufficient documentation

## 2019-01-21 DIAGNOSIS — Z01811 Encounter for preprocedural respiratory examination: Secondary | ICD-10-CM

## 2019-01-21 DIAGNOSIS — I350 Nonrheumatic aortic (valve) stenosis: Secondary | ICD-10-CM | POA: Insufficient documentation

## 2019-01-21 DIAGNOSIS — R2689 Other abnormalities of gait and mobility: Secondary | ICD-10-CM | POA: Diagnosis present

## 2019-01-21 HISTORY — DX: Hypothyroidism, unspecified: E03.9

## 2019-01-21 HISTORY — DX: Unspecified asthma, uncomplicated: J45.909

## 2019-01-21 HISTORY — DX: Dyspnea, unspecified: R06.00

## 2019-01-21 HISTORY — DX: Personal history of other diseases of the digestive system: Z87.19

## 2019-01-21 HISTORY — DX: Gastro-esophageal reflux disease without esophagitis: K21.9

## 2019-01-21 HISTORY — DX: Pneumonia, unspecified organism: J18.9

## 2019-01-21 LAB — CBC
HCT: 29.9 % — ABNORMAL LOW (ref 36.0–46.0)
Hemoglobin: 9.6 g/dL — ABNORMAL LOW (ref 12.0–15.0)
MCH: 30.7 pg (ref 26.0–34.0)
MCHC: 32.1 g/dL (ref 30.0–36.0)
MCV: 95.5 fL (ref 80.0–100.0)
Platelets: 304 10*3/uL (ref 150–400)
RBC: 3.13 MIL/uL — ABNORMAL LOW (ref 3.87–5.11)
RDW: 14.8 % (ref 11.5–15.5)
WBC: 8 10*3/uL (ref 4.0–10.5)
nRBC: 0 % (ref 0.0–0.2)

## 2019-01-21 LAB — TYPE AND SCREEN
ABO/RH(D): O POS
Antibody Screen: NEGATIVE

## 2019-01-21 LAB — BLOOD GAS, ARTERIAL
Acid-Base Excess: 0 mmol/L (ref 0.0–2.0)
Bicarbonate: 24.2 mmol/L (ref 20.0–28.0)
Drawn by: 421801
FIO2: 21
O2 Saturation: 99.3 %
Patient temperature: 37
pCO2 arterial: 39.5 mmHg (ref 32.0–48.0)
pH, Arterial: 7.404 (ref 7.350–7.450)
pO2, Arterial: 132 mmHg — ABNORMAL HIGH (ref 83.0–108.0)

## 2019-01-21 LAB — COMPREHENSIVE METABOLIC PANEL
ALT: 39 U/L (ref 0–44)
AST: 59 U/L — ABNORMAL HIGH (ref 15–41)
Albumin: 3.6 g/dL (ref 3.5–5.0)
Alkaline Phosphatase: 386 U/L — ABNORMAL HIGH (ref 38–126)
Anion gap: 11 (ref 5–15)
BUN: 13 mg/dL (ref 8–23)
CO2: 22 mmol/L (ref 22–32)
Calcium: 9.2 mg/dL (ref 8.9–10.3)
Chloride: 100 mmol/L (ref 98–111)
Creatinine, Ser: 1.23 mg/dL — ABNORMAL HIGH (ref 0.44–1.00)
GFR calc Af Amer: 46 mL/min — ABNORMAL LOW (ref >60)
GFR calc non Af Amer: 40 mL/min — ABNORMAL LOW (ref >60)
Glucose, Bld: 105 mg/dL — ABNORMAL HIGH (ref 70–99)
Potassium: 4.3 mmol/L (ref 3.5–5.1)
Sodium: 133 mmol/L — ABNORMAL LOW (ref 135–145)
Total Bilirubin: 0.6 mg/dL (ref 0.3–1.2)
Total Protein: 6.9 g/dL (ref 6.5–8.1)

## 2019-01-21 LAB — URINALYSIS, ROUTINE W REFLEX MICROSCOPIC
Bilirubin Urine: NEGATIVE
Glucose, UA: NEGATIVE mg/dL
Hgb urine dipstick: NEGATIVE
Ketones, ur: NEGATIVE mg/dL
Leukocytes,Ua: NEGATIVE
Nitrite: NEGATIVE
Protein, ur: NEGATIVE mg/dL
Specific Gravity, Urine: 1.01 (ref 1.005–1.030)
pH: 7 (ref 5.0–8.0)

## 2019-01-21 LAB — APTT: aPTT: 37 seconds — ABNORMAL HIGH (ref 24–36)

## 2019-01-21 LAB — SURGICAL PCR SCREEN
MRSA, PCR: NEGATIVE
Staphylococcus aureus: NEGATIVE

## 2019-01-21 LAB — HEMOGLOBIN A1C
Hgb A1c MFr Bld: 4.9 % (ref 4.8–5.6)
Mean Plasma Glucose: 93.93 mg/dL

## 2019-01-21 LAB — PROTIME-INR
INR: 1.1 (ref 0.8–1.2)
Prothrombin Time: 13.7 seconds (ref 11.4–15.2)

## 2019-01-21 LAB — ABO/RH: ABO/RH(D): O POS

## 2019-01-21 LAB — BRAIN NATRIURETIC PEPTIDE: B Natriuretic Peptide: 181.9 pg/mL — ABNORMAL HIGH (ref 0.0–100.0)

## 2019-01-21 MED ORDER — CLOPIDOGREL BISULFATE 75 MG PO TABS: 75.0000 mg | ORAL_TABLET | Freq: Every day | ORAL | Status: DC

## 2019-01-21 NOTE — Progress Notes (Signed)
Anesthesia Chart Review:  Case: [id number] Date/Time: 01/26/19 1030   Procedure: TRANSCATHETER AORTIC VALVE REPLACEMENT, TRANSFEMORAL (N/A Chest)   Anesthesia type: General   Pre-op diagnosis: aortic stenosis   Location: [facility] OR ROOM 16 / [facility]   Surgeons: Burnell Blanks, MD    CT Surgeon: Gilford Raid, MD   DISCUSSION: Patient is an 85 year old female scheduled for the above procedure.  History includes never smoker, severe aortic stenosis, CAD (s/p orbital atherectomy/DES proximal LAD and orbital atherectomy/PTCA D1 with residual 15-20% stenosis 76/73/4193; complicated by right arm and groing hematoma but did not require transfusion or surgical intervention), HTN, HLD, hiatal hernia, GERD, dyspnea, asthma, hypothyroidism, RML lung nodule (01/01/2019 CT, one year follow-up rec).  She has anaphylactic reaction with aspirin and Ceclor.  She is scheduled for preoperative COVID-19 test on 01/23/2019.  Anesthesia team to evaluate on the day of surgery.   VS: BP (!) 119/45   Pulse 60   Temp 36.5 C   Resp 12   Ht 5' (1.524 m)   Wt 55.4 kg   SpO2 99%   BMI 23.85 kg/m    PROVIDERS: Kelton Pillar, MD PCP Adrian Prows, MD is cardiologist   LABS: Labs reviewed: Acceptable for surgery. Alk phos is elevated at 386. AST 59, ALT 39, PLT count 304. H/H 9.6/29.9, which is improved from 08/28/25.8 on 01/06/19. Has T&S. (all labs ordered are listed, but only abnormal results are displayed)  Labs Reviewed  APTT - Abnormal; Notable for the following components:      Result Value   aPTT 37 (*)    All other components within normal limits  BLOOD GAS, ARTERIAL - Abnormal; Notable for the following components:   pO2, Arterial 132 (*)    All other components within normal limits  BRAIN NATRIURETIC PEPTIDE - Abnormal; Notable for the following components:   B Natriuretic Peptide 181.9 (*)    All other components within normal limits  CBC - Abnormal; Notable for the following components:   RBC  3.13 (*)    Hemoglobin 9.6 (*)    HCT 29.9 (*)    All other components within normal limits  COMPREHENSIVE METABOLIC PANEL - Abnormal; Notable for the following components:   Sodium 133 (*)    Glucose, Bld 105 (*)    Creatinine, Ser 1.23 (*)    AST 59 (*)    Alkaline Phosphatase 386 (*)    GFR calc non Af Amer 40 (*)    GFR calc Af Amer 46 (*)    All other components within normal limits  SURGICAL PCR SCREEN  HEMOGLOBIN A1C  PROTIME-INR  URINALYSIS, ROUTINE W REFLEX MICROSCOPIC  TYPE AND SCREEN    IMAGES: CXR 01/21/2019: IMPRESSION: Large hiatal hernia. No other focal abnormality is noted. Aortic Atherosclerosis (ICD10-I70.0).  CTA chest/abd/pelvis 01/01/2019: IMPRESSION: 1. Vascular findings and measurements pertinent to potential TAVR procedure, as detailed above. 2. Calcifications of the aortic valve, compatible with the reported clinical history of severe aortic stenosis. 3. 8 x 6 mm nodule in the lateral segment of the right middle lobe, unchanged compared to the recent prior study from September 12, 2018. Non-contrast chest CT at 12 months is recommended. If the nodule is stable at time of repeat CT, then future CT at 18-24 months (from today's scan) is considered optional for low-risk patients, but is recommended for high-risk patients. This recommendation follows the consensus statement: Guidelines for Management of Incidental Pulmonary Nodules Detected on CT Images: From the Fleischner Society 2017; Radiology  2017; 921:194-174. 4. Cirrhosis. 5. Additional incidental findings, as above [See full report]   EKG: 01/13/2019: Sinus  Rhythm  -Frequent PACs -atrial bigeminy  -Old anterior infarct.   -  Nonspecific T-abnormality.  ABNORMAL    CV: Left Heart Catheterization 01/05/2019:  FINDINGS: Left Anterior Descending Prox LAD lesion is 80% stenosed. Mid LAD lesion is 40% stenosed. First Diagonal Branch 1st Diag lesion is 90% stenosed. The lesion is type C and  located at the major branch. The lesion is calcified. The stenosis was measured by a visual reading. Ramus Intermedius Ramus lesion is 90% stenosed. PCI: Successful orbital atherectomy followed by stenting of the proximal LAD with a 3.0 x 18 mm resolute Onyx DES and proximal segment postdilated with a 3.5 x 8 mm Sapphire [place].  Stenosis 90% to 0%.  TIMI-3 to TIMI-3 flow.  Orbital atherectomy followed by balloon angioplasty with 2.5 x 15 mm Sapphire balloon for D1, 90% to less than 15 to 20% stenosis.  TIMI-3 to TIMI-3 flow.   Right and left heart catheterization 12/15/2018: RA 5/5, mean 5; RV 31/3, EDP 6; PA 32/14, mean 21, PA saturation 67%; PW 11/10, mean 10 mmHg.  CO 3.84, CI  2.5756. QP/QS 1.0.  Normal right heart catheterization. Mildly elevated LVEDP at 15 mmHg.  Peak aortic valve gradient of 27, mean 23.6 mmHg with calculated aortic valve area 0.76 cm.  Findings consistent with severe calcific degenerative aortic valve stenosis. Left dominant circulation, proximal LAD 80% stenosis, large D1 with ostial 90% stenosis, medium sized RI with a proximal 90% stenosis, dominant circumflex mild disease.  RCA mild disease and nondominant. Recommendation: Patient will need to be evaluated for CABG and aortic valve replacement.  If the TAVR team feels she is a good candidate for TAVR, we could certainly consider either medical management of CAD or PCI to proximal LAD and TAVR.    CT Coronary 01/01/2019: IMPRESSION: 1. Severe aortic stenosis. 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 2. Annular area 3.03 cm2 (perimeter 62.3 mm). No significant annular/subannular calcification noted. 3. Sufficient coronary to annulus distance. 4. Optimum Fluoroscopic Angle for Delivery: LAO 25 CAU 5   Echocardiogram 10/01/2018: Left ventricle cavity is normal in size. Mild concentric hypertrophy of the left ventricle. Normal LV systolic function with EF 59%. Normal global wall motion. Doppler  evidence of grade II (pseudonormal) diastolic dysfunction, elevated LAP.  Trileaflet aortic valve. Moderate calcification of the aortic valve annulus and leaflets. At least moderate aortic stenosis. Moderate (Grade II) aortic regurgitation. Aortic valve mean gradient of 32 mmHg, Vmax of 3.6  m/s. Calculated aortic valve area by continuity equation is 0.6 cm. Dimensionless index is 0.21. Findings suggest paradoxically low flow low gradient severe aortic stenosis. Consider clinical correlation and further workup. Mild calcification of mitral leaflets. Mild restriction without significant stenosis. Moderate (Grade II) mitral regurgitation. Mild to moderate tricuspid regurgitation. Estimated pulmonary artery systolic pressure is 35 mmHg.  Compared to previous study, aortic stenosis is increased in severity.    Carotid US 01/01/2019: Summary: - Right Carotid: Velocities in the right ICA are consistent with a 1-39% stenosis. - Left Carotid: Velocities in the left ICA are consistent with a 1-39% stenosis. - Vertebrals:  Bilateral vertebral arteries demonstrate antegrade flow. - Subclavians: Normal flow hemodynamics were seen in bilateral subclavian              arteries.    Past Medical History:  Diagnosis Date  . Arthritis   . Asthma  history of  . CAD (coronary artery disease)   . Dyspnea    sometimes sitting.lying and on exertion  . GERD (gastroesophageal reflux disease)   . History of hiatal hernia   . Hyperlipidemia   . Hypertension   . Hypothyroidism   . Lipoma    left arm  . Nasal polyps   . Pneumonia   . Pulmonary nodule    noted on pre TAVR CT  . Severe aortic stenosis     Past Surgical History:  Procedure Laterality Date  . CHOLECYSTECTOMY  02/27/04  . CORONARY ATHERECTOMY N/A 01/05/2019   Procedure: CORONARY ATHERECTOMY;  Surgeon: Adrian Prows, MD;  Location: [place] [facility];  Service: Cardiovascular;  Laterality: N/A;  . CORONARY BALLOON ANGIOPLASTY N/A 01/05/2019    Procedure: CORONARY BALLOON ANGIOPLASTY;  Surgeon: Adrian Prows, MD;  Location: [place] [facility];  Service: Cardiovascular;  Laterality: N/A;  . CORONARY STENT INTERVENTION N/A 01/05/2019   Procedure: CORONARY STENT INTERVENTION;  Surgeon: Adrian Prows, MD;  Location: [place] [facility];  Service: Cardiovascular;  Laterality: N/A;  . CORONARY STENT INTERVENTION  01/05/2019  . INCONTINENCE SURGERY  09/12/09  . NASAL SINUS SURGERY     several    . ORIF ANKLE FRACTURE Right 09/11/2013   Procedure: OPEN REDUCTION INTERNAL FIXATION (ORIF) ANKLE FRACTURE;  Surgeon: Augustin Schooling, MD;  Location: [facility];  Service: Orthopedics;  Laterality: Right;  . RIGHT/LEFT HEART CATH AND CORONARY ANGIOGRAPHY N/A 12/15/2018   Procedure: RIGHT/LEFT HEART CATH AND CORONARY ANGIOGRAPHY;  Surgeon: Adrian Prows, MD;  Location: [place] [facility];  Service: Cardiovascular;  Laterality: N/A;  . ROTATOR CUFF REPAIR  09/27/2003  . TRIGGER FINGER RELEASE  10/29/06    MEDICATIONS: . atorvastatin (LIPITOR) 10 MG tablet  . benzonatate (TESSALON) 100 MG capsule  . cetirizine (ZYRTEC) 10 MG tablet  . cholecalciferol (VITAMIN D) 25 MCG (1000 UT) tablet  . clopidogrel (PLAVIX) 75 MG tablet  . ezetimibe (ZETIA) 10 MG tablet  . levothyroxine (SYNTHROID) 50 MCG tablet  . meclizine (ANTIVERT) 25 MG tablet  . mometasone (NASONEX) 50 MCG/ACT nasal spray  . Multiple Vitamin (MULTIVITAMIN WITH MINERALS) TABS tablet  . Polyethyl Glycol-Propyl Glycol (SYSTANE) 0.4-0.3 % SOLN  . propranolol (INDERAL) 40 MG tablet  . sodium chloride (OCEAN) 0.65 % SOLN nasal spray   No current facility-administered medications for this encounter.    Myra Gianotti, PA-C Surgical Short Stay/Anesthesiology [facility] Phone [phone] [facility] Phone [phone] 01/21/2019 5:27 PM

## 2019-01-21 NOTE — Therapy (Signed)
[place], [place], [address] Phone: [phone]   Fax:  [phone]  Physical Therapy Evaluation  Patient Details  Name: [name] [name] MRN: [id number] Date of Birth: 02/07/1931 Referring Provider (PT): Burnell Blanks, MD   Encounter Date: 01/21/2019  PT End of Session - 01/21/19 1037    Visit Number  1    Number of Visits  1    Authorization Type  MCR A&B    PT Start Time  [id number]    PT Stop Time  1110    PT Time Calculation (min)  30 min    Equipment Utilized During Treatment  Gait belt    Activity Tolerance  Patient tolerated treatment well    Behavior During Therapy  [facility] for tasks assessed/performed       Past Medical History:  Diagnosis Date  . Arthritis   . Asthma    history of  . CAD (coronary artery disease)   . Dyspnea    sometimes sitting.lying and on exertion  . GERD (gastroesophageal reflux disease)   . History of hiatal hernia   . Hyperlipidemia   . Hypertension   . Hypothyroidism   . Lipoma    left arm  . Nasal polyps   . Pneumonia   . Pulmonary nodule    noted on pre TAVR CT  . Severe aortic stenosis     Past Surgical History:  Procedure Laterality Date  . CHOLECYSTECTOMY  02/27/04  . CORONARY ATHERECTOMY N/A 01/05/2019   Procedure: CORONARY ATHERECTOMY;  Surgeon: Adrian Prows, MD;  Location: [place] [facility];  Service: Cardiovascular;  Laterality: N/A;  . CORONARY BALLOON ANGIOPLASTY N/A 01/05/2019   Procedure: CORONARY BALLOON ANGIOPLASTY;  Surgeon: Adrian Prows, MD;  Location: [place] [facility];  Service: Cardiovascular;  Laterality: N/A;  . CORONARY STENT INTERVENTION N/A 01/05/2019   Procedure: CORONARY STENT INTERVENTION;  Surgeon: Adrian Prows, MD;  Location: [place] [facility];  Service: Cardiovascular;  Laterality: N/A;  . CORONARY STENT INTERVENTION  01/05/2019  . INCONTINENCE SURGERY  09/12/09  . NASAL SINUS SURGERY     several    . ORIF ANKLE FRACTURE  Right 09/11/2013   Procedure: OPEN REDUCTION INTERNAL FIXATION (ORIF) ANKLE FRACTURE;  Surgeon: Augustin Schooling, MD;  Location: [facility];  Service: Orthopedics;  Laterality: Right;  . RIGHT/LEFT HEART CATH AND CORONARY ANGIOGRAPHY N/A 12/15/2018   Procedure: RIGHT/LEFT HEART CATH AND CORONARY ANGIOGRAPHY;  Surgeon: Adrian Prows, MD;  Location: [place] [facility];  Service: Cardiovascular;  Laterality: N/A;  . ROTATOR CUFF REPAIR  09/27/2003  . TRIGGER FINGER RELEASE  10/29/06    There were no vitals filed for this visit.   Subjective Assessment - 01/21/19 1036    Subjective  She reports shortness of breathe with activities that she typically could do easily and with walking short distance.    Pertinent History  History of benign positional vertigo, chronic dyspnea, chronic back pain, hypertension, mixed hyperlipidemia and  Symptomatic aortic stenosis with worsening dyspnea    Limitations  Walking;House hold activities    How long can you sit comfortably?  No limitation    How long can you stand comfortably?  5 minutes    How long can you walk comfortably?  3 minutes    Patient Stated Goals  Get heart better    Currently in Pain?  No/denies         [name] PT Assessment - 01/21/19 0001  Assessment   Medical Diagnosis  Severe Aortic Stenosis    Referring Provider (PT)  Burnell Blanks, MD    Onset Date/Surgical Date  --   At least the last year   Hand Dominance  Right    Next MD Visit  01/26/2019   surgery   Prior Therapy  None      Precautions   Precautions  None      Restrictions   Weight Bearing Restrictions  No      Balance Screen   Has the patient fallen in the past 6 months  No    Has the patient had a decrease in activity level because of a fear of falling?   No    Is the patient reluctant to leave their home because of a fear of falling?   No      Home Environment   Living Environment  Private residence    Living Arrangements  Spouse/significant other    Type  of [place] Access  Level entry    [place]   patient lives on the 1st floor     Prior Function   Level of [place] with basic ADLs      Cognition   Overall Cognitive Status  Within Functional Limits for tasks assessed      Observation/Other Assessments   Observations  Patient appears in no apparent distress    Focus on Therapeutic Outcomes (FOTO)   NA      Sensation   Light Touch  Appears Intact      Posture/Postural Control   Posture Comments  Patient exhibits mild rounded shoulder and forward head posture      ROM / Strength   AROM / PROM / Strength  AROM;Strength      AROM   Overall AROM Comments  Patient exhibits grosslt WFL UE and LE AROM      Strength   Overall Strength Comments  Patient exhibits grossly 4/5 MMT throughout UE and LE    Strength Assessment Site  Hand    Right/Left hand  Right;Left    Right Hand Grip (lbs)  18    Left Hand Grip (lbs)  14      Palpation   Palpation comment  No TTP      Transfers   Transfers  Independent with all Transfers      Ambulation/Gait   Ambulation/Gait  Yes    Ambulation/Gait Assistance  6: Modified independent (Device/Increase time)    Gait Comments  Patient exhibits decreased step length, trendelenburg bilaterally       [facility] Pre-Surgical Assessment - 01/21/19 0001    5 Meter Walk Test- trial 1  4 sec    5 Meter Walk Test- trial 2  4 sec.     5 Meter Walk Test- trial 3  4 sec.   97 O2, 64 HR, 5 Dyspnea   5 meter walk test average  4 sec    4 Stage Balance Test tolerated for:   10 sec.    4 Stage Balance Test Position  4    comment  full tandem for 10 sec    Sit To Stand Test- trial 1  25 sec.    Comment  able to perform without use of UEs    ADL/IADL Independent with:  Bathing;Dressing;Meal prep;Finances    ADL/IADL Needs Assistance with:  [place] work    ADL/IADL Therapist, sports Index  Vulnerable  6 Minute Walk- Baseline  yes    BP (mmHg)  140/62   right arm   HR (bpm)   55    02 Sat (%RA)  98 %    Modified Borg Scale for Dyspnea  0.5- Very, very slight shortness of breath    Perceived Rate of Exertion (Borg)  6-    6 Minute Walk Post Test  yes    BP (mmHg)  131/72    HR (bpm)  84    02 Sat (%RA)  95 %    Modified Borg Scale for Dyspnea  10- Shortness of breath so severe you need to stop    Perceived Rate of Exertion (Borg)  17- Very hard    Aerobic Endurance Distance Walked  410              Objective measurements completed on examination: See above findings.              PT Education - 01/21/19 1037    Education Details  Activity modification    Person(s) Educated  Patient    Methods  Explanation    Comprehension  Verbalized understanding                  Plan - 01/21/19 1038    Clinical Impression Statement  See below    Personal Factors and Comorbidities  Age;Time since onset of injury/illness/exacerbation;Comorbidity 3+    Comorbidities  History of benign positional vertigo, chronic dyspnea, chronic back pain, hypertension, mixed hyperlipidemia and  Symptomatic aortic stenosis with worsening dyspnea    Examination-Activity Limitations  Locomotion Level    Examination-Participation Restrictions  Yard Work;Community Activity    Stability/Clinical Decision Making  Stable/Uncomplicated    Clinical Decision Making  Low    Rehab Potential  Good    PT Frequency  One time visit    PT Treatment/Interventions  ADLs/Self Care Home Management;Gait training;Therapeutic exercise;Balance training;Energy conservation    PT Next Visit Plan  NA    PT Home Exercise Plan  NA    Consulted and Agree with Plan of Care  Patient      Clinical Impression Statement: Pt is a 85 yo female presenting to OP PT for evaluation prior to possible TAVR surgery due to severe aortic stenosis. Pt reports onset of shortness of breathe approximately 12 months ago. Symptoms are limiting daily activities. Pt presents with good ROM and strength, good  balance and is not at high fall risk 4 stage balance test, good walking speed and poor aerobic endurance per 6 minute walk test. Pt ambulated 100 feet in 55 sec before requesting a seated rest beak lasting 1:15. At time of rest, patient's HR was 66 bpm and O2 was 97 on room air. Pt reported 9/10 shortness of breath on modified scale for dyspnea. Pt able to resume after rest and ambulate an additional 185 feet in 1:20 sec before requesting a seated rest beak lasting 1:50. At time of rest, patient's HR was 78 bpm and O2 was 97 on room air. Pt reported 10/10 shortness of breath on modified scale for dyspnea. Pt able to resume after rest and ambulate an additional 125 feet. Pt ambulated a total of 410 feet in 6 minute walk. Shortness of breath and exertion increased significantly with 6 minute walk test. Based on the Short Physical Performance Battery, patient has a frailty rating of 9/12 with </= 5/12 considered frail.   Patient demonstrates the following deficits and impairments:  Abnormal gait, Cardiopulmonary  status limiting activity, Decreased endurance, Decreased balance  Visit Diagnosis: Other abnormalities of gait and mobility     Problem List Patient Active Problem List   Diagnosis Date Noted  . CAD (coronary artery disease), native coronary artery 01/05/2019  . Severe aortic stenosis 01/05/2019  . Post PTCA 01/05/2019  . Dyspnea on exertion 12/14/2018  . Nasal polyposis 03/31/2018  . Osteoarthritis of knee 07/28/2017  . Pain in right knee 06/12/2017  . Lumbar radiculopathy 06/03/2017  . Pain in lower limb 05/29/2017  . Lumbar spondylosis 05/12/2017  . Chronic maxillary sinusitis 12/20/2015  . Upper airway cough syndrome 11/21/2015  . Hyperlipidemia 09/25/2013  . Allergic rhinitis, cause unspecified 09/15/2013  . Unspecified constipation 09/15/2013  . Fracture dislocation of ankle joint 09/11/2013  . Bimalleolar ankle fracture 09/11/2013  . TIA (transient ischemic attack)  06/24/2012  . HTN (hypertension) 06/24/2012  . Radicular pain in left arm 03/12/2011    Vidalia Blades, PT, DPT, LAT, ATC 01/21/19  11:18 AM Phone: [phone] Fax: [facility] [address] [place], [place], [address] Phone: [phone]   Fax:  [phone]  Name: [name] [name] MRN: [id number] Date of Birth: [birth year]

## 2019-01-21 NOTE — Telephone Encounter (Signed)
   [facility]   MULTIDISCIPLINARY HEART VALVE TEAM  I was called by the patient and PAT nurse while at pre admission testing to clarify whether or not she needed to stop her plavix for TAVR. The patient has an allergy to aspirin so has been on monotherapy with plavix after recent stent. Patient has only been taking plavix every other day. I clarified with Dr. Einar Gip that she should be taking it everyday. Her med list has been updated. She will need to continue this for at least 6 months out from her TAVR. She should continue her plavix through the day before surgery.   Angelena Form PA-C  [facility]

## 2019-01-21 NOTE — Anesthesia Preprocedure Evaluation (Addendum)
Anesthesia Evaluation  Patient identified by MRN, date of birth, ID band Patient awake    Reviewed: Allergy & Precautions, NPO status , Patient's Chart, lab work & pertinent test results  History of Anesthesia Complications Negative for: history of anesthetic complications  Airway Mallampati: II  TM Distance: >3 FB Neck ROM: Full    Dental no notable dental hx. (+) Dental Advisory Given   Pulmonary asthma ,    Pulmonary exam normal        Cardiovascular hypertension, Pt. on home beta blockers + CAD and + Cardiac Stents  Normal cardiovascular exam+ Valvular Problems/Murmurs AS  + Systolic murmurs Echocardiogram 10/01/2018: Left ventricle cavity is normal in size. Mild concentric hypertrophy of the left ventricle. Normal LV systolic function with EF 59%. Normal global wall motion. Doppler evidence of grade II (pseudonormal) diastolic dysfunction, elevated LAP.  Trileaflet aortic valve. Moderate calcification of the aortic valve annulus and leaflets. At least moderate aortic stenosis. Moderate (Grade II) aortic regurgitation. Aortic valve mean gradient of 32 mmHg, Vmax of 3.6 m/s. Calculated aortic valve area by continuity equation is 0.6 cm. Dimensionless index is 0.21. Findings suggest paradoxically low flow low gradient severe aortic stenosis. Consider clinical correlation and further workup. Mild calcification of mitral leaflets. Mild restriction without significant stenosis. Moderate (Grade II) mitral regurgitation. Mild to moderate tricuspid regurgitation. Estimated pulmonary artery systolic pressure is 35 mmHg.  Compared to previous study, aortic stenosis is increased in severity.     Neuro/Psych TIAnegative psych ROS   GI/Hepatic Neg liver ROS, hiatal hernia, GERD  ,  Endo/Other  Hypothyroidism   Renal/GU Renal InsufficiencyRenal disease     Musculoskeletal negative musculoskeletal ROS (+)   Abdominal   Peds   Hematology negative hematology ROS (+)   Anesthesia Other Findings Day of surgery medications reviewed with the patient.  Reproductive/Obstetrics                            Anesthesia Physical Anesthesia Plan  ASA: IV  Anesthesia Plan: MAC   Post-op Pain Management:    Induction: Intravenous  PONV Risk Score and Plan: 2 and Ondansetron and Propofol infusion  Airway Management Planned: Natural Airway  Additional Equipment: Arterial line  Intra-op Plan:   Post-operative Plan:   Informed Consent: I have reviewed the patients History and Physical, chart, labs and discussed the procedure including the risks, benefits and alternatives for the proposed anesthesia with the patient or authorized representative who has indicated his/her understanding and acceptance.     Dental advisory given  Plan Discussed with: CRNA and Anesthesiologist  Anesthesia Plan Comments: (PAT note written 01/21/2019 by Myra Gianotti, PA-C. )      Anesthesia Quick Evaluation

## 2019-01-21 NOTE — Progress Notes (Addendum)
PCP - elaine griffin Cardiologist - ganji    Chest x-ray - today EKG - 01-13-19 Stress Test -  ECHO - 10/01/18 Cardiac Cath - 01/05/19  Sleep Study - na CPAP -   Fasting Blood Sugar - na Checks Blood Sugar _____ times a day  Blood Thinner Instructions:Pt. To continue plavix per Bennett Scrape. Not to take DOS. Pt. Stated she was told by Dr. Einar Gip to take plavix every other day. She has been doing this for the past 3 weeks. Nell Range was notified. She called back , stated pt. Is to take it every day. Instructed pt. Pt. Verbalizes understanding.  Notified Bennett Scrape of abnormal labs and cxr pending. Stated she would review all of these.   COVID TEST- 01/21/19   Anesthesia review:   Patient denies shortness of breath, fever, cough and chest pain at PAT appointment   All instructions explained to the patient, with a verbal understanding of the material. Patient agrees to go over the instructions while at home for a better understanding. Patient also instructed to self quarantine after being tested for COVID-19. The opportunity to ask questions was provided.

## 2019-01-23 ENCOUNTER — Other Ambulatory Visit (HOSPITAL_COMMUNITY)
Admission: RE | Admit: 2019-01-23 | Discharge: 2019-01-23 | Disposition: A | Discharge status: Home / Self Care | Payer: Federal, State, Local not specified - PPO | Source: Ambulatory Visit | Attending: Cardiovascular Disease | Admitting: Cardiovascular Disease

## 2019-01-23 DIAGNOSIS — Z01812 Encounter for preprocedural laboratory examination: Secondary | ICD-10-CM | POA: Insufficient documentation

## 2019-01-23 DIAGNOSIS — Z20822 Contact with and (suspected) exposure to covid-19: Secondary | ICD-10-CM | POA: Diagnosis not present

## 2019-01-23 LAB — SARS CORONAVIRUS 2 (TAT 6-24 HRS): SARS Coronavirus 2: NEGATIVE

## 2019-01-25 MED ORDER — MAGNESIUM SULFATE 50 % IJ SOLN
40.0000 meq | INTRAMUSCULAR | Status: DC
  Filled 2019-01-25: qty 9.85

## 2019-01-25 MED ORDER — DEXMEDETOMIDINE HCL IN NACL 400 MCG/100ML IV SOLN
0.1000 ug/kg/h | INTRAVENOUS | Status: AC
  Administered 2019-01-26: 12:00:00 1 ug/kg/h via INTRAVENOUS
  Administered 2019-01-26: 11:00:00 55.4 ug via INTRAVENOUS
  Filled 2019-01-25: qty 100

## 2019-01-25 MED ORDER — POTASSIUM CHLORIDE 2 MEQ/ML IV SOLN
80.0000 meq | INTRAVENOUS | Status: DC
  Filled 2019-01-25: qty 40

## 2019-01-25 MED ORDER — VANCOMYCIN HCL 1250 MG/250ML IV SOLN
1250.0000 mg | INTRAVENOUS | Status: AC
  Administered 2019-01-26: 12:00:00 1250 mg via INTRAVENOUS
  Filled 2019-01-25: qty 250

## 2019-01-25 MED ORDER — SODIUM CHLORIDE 0.9 % IV SOLN
INTRAVENOUS | Status: DC
  Filled 2019-01-25: qty 30

## 2019-01-25 MED ORDER — NOREPINEPHRINE 4 MG/250ML-% IV SOLN
0.0000 ug/min | INTRAVENOUS | Status: AC
  Administered 2019-01-26: 1 ug/min via INTRAVENOUS
  Filled 2019-01-25: qty 250

## 2019-01-25 MED ORDER — LEVOFLOXACIN IN D5W 500 MG/100ML IV SOLN
500.0000 mg | INTRAVENOUS | Status: DC
  Filled 2019-01-25: qty 100

## 2019-01-25 NOTE — H&P (Signed)
[address]       [place] [address]             [phone]      Cardiothoracic Surgery Admission History and Physical   Referring Provider is Adrian Prows, MD  Primary Cardiologist is No primary care provider on file.  PCP is Kelton Pillar, MD      Chief Complaint  Patient presents with  . Aortic Stenosis      . Coronary Artery Disease   HPI:  The patient is an 86 year old woman with a history of hypertension and hyperlipidemia who presents with a several month history of progressive exertional dyspnea and fatigue. She is still doing her housework but is exhausted by lunchtime. She gets short of breath with walking any distance and has to stop to rest. She denies any chest pain or pressure. She has had no dizziness or syncope. She had a remote echocardiogram in 2015 which showed mildly calcified aortic valve leaflets with a mean gradient of 12 mmHg across aortic valve. Her most recent echocardiogram on 10/01/2018 showed a mean gradient of 32 mmHg with a calculated valve area of 0.6 cm. The dimensionless index was 0.21 consistent with moderate to severe aortic stenosis. There is also moderate aortic insufficiency. Left ventricular ejection fraction was 59%. There is grade 2 diastolic dysfunction. She subsequently underwent cardiac catheterization on 12/15/2018 which showed a proximal 80% LAD stenosis just before a large first diagonal which had ostial 90% stenosis. There is a small to medium size intermediate branch with proximal 90% stenosis. The left circumflex had no significant stenosis. The right coronary had mild disease and was small and nondominant. The mean gradient across aortic valve was measured at 23.6 mmHg with a calculated valve area of 0.76 cm. Cardiac index was 2.6. LVEDP was mildly elevated at 15 mmHg. She underwent successful orbital atherectomy and stenting of the proximal LAD with a DES and orbital atherectomy and balloon angioplasty of the  diagonal branch.   The has a daughter who lives in [place]. She lives at home with her husband who is a few years older and has some dementia. She provides most of his care.      Past Medical History:  Diagnosis Date  . Arthritis   . Hyperlipidemia   . Hypertension   . Lipoma    left arm  . Nasal polyps         Past Surgical History:  Procedure Laterality Date  . CHOLECYSTECTOMY  02/27/04  . INCONTINENCE SURGERY  09/12/09  . ORIF ANKLE FRACTURE Right 09/11/2013   Procedure: OPEN REDUCTION INTERNAL FIXATION (ORIF) ANKLE FRACTURE; Surgeon: Augustin Schooling, MD; Location: [facility]; Service: Orthopedics; Laterality: Right;  . RIGHT/LEFT HEART CATH AND CORONARY ANGIOGRAPHY N/A 12/15/2018   Procedure: RIGHT/LEFT HEART CATH AND CORONARY ANGIOGRAPHY; Surgeon: Adrian Prows, MD; Location: [facility]; Service: Cardiovascular; Laterality: N/A;  . ROTATOR CUFF REPAIR  09/27/2003  . TRIGGER FINGER RELEASE  10/29/06        Family History  Problem Relation Age of Onset  . Tuberculosis Mother   . Other Father    Bronchiectasis  . Cancer Maternal Grandmother    ovarian  . Sarcoidosis Sister   . Asthma Maternal Grandfather   . Breast cancer Daughter    Social History        Socioeconomic History  . Marital status: Married    Spouse name: Not on file  . Number of children:  3  . Years of education: Not on file  . Highest education level: Not on file  Occupational History  . Not on file  Social Needs  . Financial resource strain: Not on file  . Food insecurity    Worry: Not on file    Inability: Not on file  . Transportation needs    Medical: Not on file    Non-medical: Not on file  Tobacco Use  . Smoking status: Never Smoker  . Smokeless tobacco: Never Used  Substance and Sexual Activity  . Alcohol use: No  . Drug use: No  . Sexual activity: Not on file  Lifestyle  . Physical activity    Days per week: Not on file    Minutes per session: Not on file  . Stress: Not on file    Relationships  . Social Herbalist on phone: Not on file    Gets together: Not on file    Attends religious service: Not on file    Active member of club or organization: Not on file    Attends meetings of clubs or organizations: Not on file    Relationship status: Not on file  . Intimate partner violence    Fear of current or ex partner: Not on file    Emotionally abused: Not on file    Physically abused: Not on file    Forced sexual activity: Not on file  Other Topics Concern  . Not on file  Social History Narrative  . Not on file         Current Outpatient Medications  Medication Sig Dispense Refill  . atorvastatin (LIPITOR) 10 MG tablet Take 0.5 tablets (5 mg total) by mouth daily. 30 tablet 2  . benzonatate (TESSALON) 100 MG capsule Take 1-2 capsules (100-200 mg total) by mouth 3 (three) times daily as needed. 60 capsule 0  . cetirizine (ZYRTEC) 10 MG tablet Take 10 mg by mouth daily.    . cholecalciferol (VITAMIN D) 25 MCG (1000 UT) tablet Take 1,000 Units by mouth daily.    . clopidogrel (PLAVIX) 75 MG tablet Take 1 tablet (75 mg total) by mouth every other day. 90 tablet 1  . ezetimibe (ZETIA) 10 MG tablet Take 10 mg by mouth daily.     . meclizine (ANTIVERT) 25 MG tablet Take 25 mg by mouth 3 (three) times daily as needed for dizziness.    . mometasone (NASONEX) 50 MCG/ACT nasal spray Place 2 sprays into the nose every evening.     . Multiple Vitamin (MULTIVITAMIN WITH MINERALS) TABS tablet Take 1 tablet by mouth 4 (four) times a week.     Vladimir Faster Glycol-Propyl Glycol (SYSTANE) 0.4-0.3 % SOLN Place 1 drop into both eyes 3 (three) times daily as needed (dry/irritated eyes.).    Marland Kitchen propranolol (INDERAL) 40 MG tablet Take 0.5 tablets (20 mg total) by mouth 3 (three) times daily. 270 tablet 3  . sodium chloride (OCEAN) 0.65 % SOLN nasal spray Place 1 spray into both nostrils daily.    Marland Kitchen levothyroxine (SYNTHROID) 50 MCG tablet Take 50 mcg by mouth daily before  breakfast.     No current facility-administered medications for this visit.         Allergies  Allergen Reactions  . Aspirin Anaphylaxis  . Ceclor [Cefaclor] Anaphylaxis  . Statins Other (See Comments)    Elevated liver enzymes  . Penicillins Rash    Has patient had a PCN reaction causing immediate rash, facial/tongue/throat  swelling, SOB or lightheadedness with hypotension: Yes  Has patient had a PCN reaction causing severe rash involving mucus membranes or skin necrosis: No  Has patient had a PCN reaction that required hospitalization No  Has patient had a PCN reaction occurring within the last 10 years: No  If all of the above answers are "NO", then may proceed with Cephalosporin use.  . Sulfa Antibiotics Rash   Review of Systems:   General: normal appetite, + decreased energy, no weight gain, no weight loss, no fever  Cardiac: no chest pain with exertion, no chest pain at rest, +SOB with mild exertion, no resting SOB, no PND, no orthopnea, no palpitations, no arrhythmia, no atrial fibrillation, no LE edema, no dizzy spells, no syncope  Respiratory: + exertional shortness of breath, no home oxygen, no productive cough, no dry cough, no bronchitis, + wheezing, no hemoptysis, no asthma, no pain with inspiration or cough, no sleep apnea, no CPAP at night  GI: no difficulty swallowing, no reflux, no frequent heartburn, no hiatal hernia, no abdominal pain, no constipation, no diarrhea, no hematochezia, no hematemesis, no melena  GU: no dysuria, no frequency, no urinary tract infection, no hematuria, no kidney stones, no kidney disease  Vascular: no pain suggestive of claudication, no pain in feet, no leg cramps, no varicose veins, no DVT, no non-healing foot ulcer  Neuro: no stroke, no TIA's, no seizures, no headaches, no temporary blindness one eye, no slurred speech, no peripheral neuropathy, no chronic pain, no instability of gait, no memory/cognitive dysfunction  Musculoskeletal: +  arthritis, no joint swelling, no myalgias, no difficulty walking, normal mobility  Skin: no rash, no itching, no skin infections, no pressure sores or ulcerations  Psych: no anxiety, no depression, no nervousness, no unusual recent stress  Eyes: no blurry vision, no floaters, no recent vision changes, + wears glasses or contacts  ENT: no hearing loss, no loose or painful teeth, no dentures, last saw dentist this year.  Hematologic: + easy bruising, no abnormal bleeding, no clotting disorder, no frequent epistaxis  Endocrine: no diabetes, does not check CBG's at home    Physical Exam:   BP (!) 145/82 (BP Location: Right Arm)  Pulse 79  Temp 97.7 F (36.5 C) (Skin)  Resp 20  Ht 5' (1.524 m)  Wt 124 lb 8 oz (56.5 kg)  SpO2 95% Comment: RA  BMI 24.31 kg/m  General: Elderly but well-appearing  HEENT: Unremarkable, NCAT, PERLA, EOMI  Neck: no JVD, no bruits, no adenopathy  Chest: clear to auscultation, symmetrical breath sounds, no wheezes, no rhonchi  CV: RRR, grade lll/VI crescendo/decrescendo murmur heard best at RSB, no diastolic murmur  Abdomen: soft, non-tender, no masses  Extremities: warm, well-perfused, pulses palpable in feet, no LE edema  Rectal/GU Deferred  Neuro: Grossly non-focal and symmetrical throughout  Skin: Clean and dry, no rashes, no breakdown    Diagnostic Tests:   Echocardiogram 10/01/2018: done at [facility]  Left ventricle cavity is normal in size. Mild concentric hypertrophy of the left ventricle. Normal LV systolic function with EF 59%. Normal global wall motion. Doppler evidence of grade II (pseudonormal) diastolic dysfunction, elevated LAP.  Trileaflet aortic valve. Moderate calcification of the aortic valve annulus and leaflets. At least moderate aortic stenosis. Moderate (Grade II) aortic regurgitation. Aortic valve mean gradient of 32 mmHg, Vmax of 3.6 m/s. Calculated aortic valve area by continuity equation is 0.6 cm. Dimensionless index  is 0.21. Findings suggest paradoxically low flow low gradient severe aortic stenosis. Consider  clinical correlation and further workup.  Mild calcification of mitral leaflets. Mild restriction without significant stenosis. Moderate (Grade II) mitral regurgitation.  Mild to moderate tricuspid regurgitation. Estimated pulmonary artery systolic pressure is 35 mmHg.  Compared to previous study, aortic stenosis is increased in severity.     CARDIAC CATHETERIZATION  Order# [id number]  Reading physician: Adrian Prows, MD Ordering physician: Adrian Prows, MD Study date: 12/15/18  Physicians  Panel Physicians Referring Physician Case Authorizing Physician  Adrian Prows, MD (Primary)    Procedures  RIGHT/LEFT HEART CATH AND CORONARY ANGIOGRAPHY  Conclusion  Right and left heart catheterization 12/15/2018:  RA 5/5, mean 5; RV 31/3, EDP 6; PA 32/14, mean 21, PA saturation 67%; PW 11/10, mean 10 mmHg. CO 3.84, CI 2.5756. QP/QS 1.0. Normal right heart catheterization.  Mildly elevated LVEDP at 15 mmHg. Peak aortic valve gradient of 27, mean 23.6 mmHg with calculated aortic valve area 0.76 cm. Findings consistent with severe calcific degenerative aortic valve stenosis.  Left dominant circulation, proximal LAD 80% stenosis, large D1 with ostial 90% stenosis, medium sized RI with a proximal 90% stenosis, dominant circumflex mild disease. RCA mild disease and nondominant.  Recommendation: Patient will need to be evaluated for CABG and aortic valve replacement. If the TAVR team feels she is a good candidate for TAVR, we could certainly consider either medical management of CAD or PCI to proximal LAD and TAVR. Patient be discharged home today with outpatient follow-up. Patient has been referred to be evaluated by Dr. Gilford Raid.  30 mL contrast utilized.  Indications  Dyspnea on exertion [R06.00 (ICD-10-CM)]  Nonrheumatic aortic valve stenosis [I35.0 (ICD-10-CM)]  Coronary artery disease involving native coronary  artery of native heart with other form of angina pectoris (HCC) [I25.118 (ICD-10-CM)]  Procedural Details  Technical Details Procedure performed: Right heart catheterization, left heart catheterization including left ventricular hemodynamic monitoring, selective right and left coronary arteriogram.  Indication: Patient with severe worsening dyspnea, severe aortic stenosis by echocardiogram brought to the cardiac catheterization lab for evaluation of coronary anatomy and evaluation of aortic valve gradients.  Technique: Under sterile precautions using 5 French right antecubital vein access, a balloontipped catheter was advanced into the pulmonary capillary wedge position without any complication. Right-sided hemodynamics performed carefully and waveform was carefully analyzed. Catheter then pulled out of the body.  Right radial arterial access obtained, 6 French sheath introduced, a 5 [place] Tiger 4 diagnostic catheter was utilized to perform left heart catheterization. Selective right and left coronary arteriogram was performed, same catheter was utilized to cross into the left ventricle across the calcified aortic valve, pressure gradient was obtained. Hemodynamics carefully analyzed. Catheter then pulled out of the body with a J-wire. Hemostasis obtained by applying TR band at the arterial access site and manual pressure at the antecubital vein access site. 30 mm contrast utilized. No immediate complications. Estimated blood loss <50 mL.   During this procedure medications were administered to achieve and maintain moderate conscious sedation while the patient's heart rate, blood pressure, and oxygen saturation were continuously monitored and I was present face-to-face 100% of this time.  Medications  (Filter: Administrations occurring from 12/15/18 1027 to 12/15/18 1135)          Medication Rate/Dose/Volume Action  Date Time   Heparin (Porcine) in NaCl 1000-0.9 UT/500ML-% SOLN (mL) 500 mL Given  12/15/18 1037   Total dose as of 12/15/18 1135 500 mL Given 1037   1,000 mL        fentaNYL (SUBLIMAZE) injection (mcg) 25  mcg Given 12/15/18 1048   Total dose as of 12/15/18 1135        25 mcg        midazolam (VERSED) injection (mg) 1 mg Given 12/15/18 1048   Total dose as of 12/15/18 1135        1 mg        lidocaine (PF) (XYLOCAINE) 1 % injection (mL) 2 mL Given 12/15/18 1055   Total dose as of 12/15/18 1135 2 mL Given 1100   4 mL        heparin injection (Units) 3,000 Units Given 12/15/18 1106   Total dose as of 12/15/18 1135 3,000 Units Canceled Entry 1108   3,000 Units        Radial Cocktail/Verapamil only (mL) 4 mL Given 12/15/18 1103   Total dose as of 12/15/18 1135        4 mL        iohexol (OMNIPAQUE) 350 MG/ML injection (mL) 30 mL Given 12/15/18 1122   Total dose as of 12/15/18 1135        30 mL        Sedation Time  Sedation Time Physician-1: 28 minutes 57 seconds  Contrast  Medication Name Total Dose  iohexol (OMNIPAQUE) 350 MG/ML injection 30 mL  Radiation/Fluoro  Fluoro time: 7.4 (min)  DAP: 13904 (mGycm2)  Cumulative Air Kerma: 229 (mGy)  Coronary Findings  Diagnostic  Dominance: Left  Left Anterior Descending  Prox LAD lesion 80% stenosed  Prox LAD lesion is 80% stenosed.  Mid LAD lesion 40% stenosed  Mid LAD lesion is 40% stenosed.  First Diagonal Branch  1st Diag lesion 90% stenosed  1st Diag lesion is 90% stenosed.  Ramus Intermedius  Ramus lesion 90% stenosed  Ramus lesion is 90% stenosed.  Intervention  No interventions have been documented.  Right Heart  Right Heart Pressures Hemodynamic findings consistent with pulmonary hypertension. Elevated LV EDP consistent with volume overload.  Left Heart  Left Ventricle LV end diastolic pressure is mildly elevated.  Aortic Valve There is severe aortic valve stenosis. The aortic valve is calcified. There is restricted aortic valve motion. Peak to peak gradient of 27 and mean gradient of 23.6 mmHg with  a calculated aortic valve area of 0.76 cm. Left and right heart catheterization 12/15/2018: Left and right heart catheterization 12/11/2018:  Coronary Diagrams  Diagnostic  Dominance: Left   Intervention  Implants     No implant documentation for this case.  Syngo Images  Link to Procedure Log   Show images for CARDIAC CATHETERIZATION Procedure Log  Images on Long Term Storage    Show images for Rickell, Wiehe   Hemo Data   Most Recent Value  Fick Cardiac Output 3.84 L/min  Fick Cardiac Output Index 2.57 (L/min)/BSA  Aortic Mean Gradient 23.58 mmHg  Aortic Peak Gradient 27 mmHg  Aortic Valve Area 0.76  Aortic Value Area Index 0.51 cm2/BSA  RA A Wave 5 mmHg  RA V Wave 5 mmHg  RA Mean 5 mmHg  RV Systolic Pressure 31 mmHg  RV Diastolic Pressure 3 mmHg  RV EDP 6 mmHg  PA Systolic Pressure 32 mmHg  PA Diastolic Pressure 14 mmHg  PA Mean 21 mmHg  PW A Wave 11 mmHg  PW V Wave 10 mmHg  PW Mean 10 mmHg  AO Systolic Pressure 024 mmHg  AO Diastolic Pressure 74 mmHg  AO Mean 097 mmHg  LV Systolic Pressure 353 mmHg  LV Diastolic Pressure 10 mmHg  LV EDP 15 mmHg  AOp Systolic Pressure 003 mmHg  AOp Diastolic Pressure 71 mmHg  AOp Mean Pressure 704 mmHg  LVp Systolic Pressure 888 mmHg  LVp Diastolic Pressure 10 mmHg  LVp EDP Pressure 14 mmHg  QP/QS 1  TPVR Index 8.19 HRUI  TSVR Index 42.89 HRUI  PVR SVR Ratio 0.1  TPVR/TSVR Ratio 0.19    Physicians  Panel Physicians Referring Physician Case Authorizing Physician  Adrian Prows, MD (Primary)    Procedures  CORONARY BALLOON ANGIOPLASTY  CORONARY STENT INTERVENTION  CORONARY ATHERECTOMY  Conclusion  Left Heart Catheterization 01/05/19:  Successful orbital atherectomy followed by stenting of the proximal LAD with a 3.0 x 18 mm resolute Onyx DES and proximal segment postdilated with a 3.5 x 8 mm Sapphire [place].  Stenosis 90% to 0%.  TIMI-3 to TIMI-3 flow.  Orbital atherectomy followed by balloon angioplasty with 2.5 x 15 mm  Sapphire balloon for D1, 90% to less than 15 to 20% stenosis.  TIMI-3 to TIMI-3 flow.  Recommendation: Patient will be observed overnight in view of right forearm hematoma.  She also has had right groin access which is closed with minx and will be observed for any hematoma development as she had mild oozing which was extracted in the lab.  She will be scheduled for TAVR as per structural heart disease team.  120 mL contrast utilized.  Recommendations  Antiplatelet/Anticoag Recommend uninterrupted dual antiplatelet therapy with Pletal for a minimum of 6 months (stable ischemic heart disease-Class I recommendation). Patient is allergic to ASA  Procedural Details  Technical Details Procedure performed: Orbital atherectomy of the proximal LAD and the D1 branch of LAD, balloon angioplasty to D1 and stenting of the proximal LAD with implantation of 3.0 x 18 mm resolute Onyx DES postdilated with 3.5 x 8 mm Sapphire [place].  Indication: Patient with known coronary artery disease and severe aortic valve stenosis, had undergone right and left heart catheterization for worsening dyspnea on December 15, 2018 and was found to have a very complex and high-grade proximal LAD 80% stenosis or greater stenosis, calcified and a very large D1 with ostial 90% calcific stenosis.  She was also found to have severe aortic stenosis.  She was evaluated by structural heart disease team, felt that the proximal LAD if stented along with diagonal, she would be a good candidate for TAVR in view of her advanced age of 87 years.  Hence she is now electively brought to the cardiac catheterization lab for PCI to proximal LAD and diagonal 1 ostium.  Technique: I initially attempted to perform a right radial arterial access however had significant spasm in the forearm and hands quickly abandoned and accessed the right common femoral artery using ultrasound guidance, a 6 French sheath was introduced.  We obtained hemostasis of the right  radial arterial access with TR band. Heparin was utilized for anticoagulation keeping ACT greater than 250 seconds.  XB 3.5 guide cath was utilized and using a Viper wire, I crossed into the D1 segment followed by CSI diamondback orbital atherectomy both at 80 and 120 K RPMs, multiple passes made.  80 K RPM utilized into the D1 ostium and up to 120 K into the proximal LAD, there were 2 tandem lesions in the proximal LAD and D1 ostium.  This was followed by balloon angioplasty of the D1 and also proximal LAD, due to type B dissection in the proximal LAD a 3.0 x 18 mm resolute Onyx DES was deployed at 14 atmospheric pressure after  changing the Viper wire to a cougar guidewire and a second wire was introduced into the LAD also a cougar guidewire.  This was followed by post dilatation with a 3.5 x 8 mm Sapphire [place].  Performed at 12 atmospheric pressure for 60 seconds.  Post angioplasty and stenting, the diagonal wire was withdrawn out and reintroduced back and the stent struts where stressed with a 2.5 x 15 mm Sapphire balloon at 3 atmospheric pressure for 30 seconds.  Post angioplasty results were excellent.  There was a residual 20 to 30% but most stenosis in the D1 with TIMI-3 flow without any dissection and 0% stenosis in the proximal LAD.  Patient had a left right forearm hematoma hence blood pressure cuff was applied and patient will be observed overnight.  She did have mild oozing in the right groin and mild hematoma which was extracted in the right groin after it was closed with minx, there was no other complication, right groin was stable without hematoma.  In view of her advanced age, anemia that is new, mild chronic renal insufficiency, complex angioplasty and stenting to proximal LAD, patient will be kept overnight for observation Estimated blood loss <50 mL.   During this procedure medications were administered to achieve and maintain moderate conscious sedation while the patient's heart rate, blood  pressure, and oxygen saturation were continuously monitored and I was present face-to-face 100% of this time.  Medications (Filter: Administrations occurring from 01/05/19 0947 to 01/05/19 1209) (important)  Continuous medications are totaled by the amount administered until 01/05/19 1209.  fentaNYL (SUBLIMAZE) injection (mcg) Total dose:  25 mcg Date/Time  Rate/Dose/Volume Action  01/05/19 1011  25 mcg Given    midazolam (VERSED) injection (mg) Total dose:  1 mg Date/Time  Rate/Dose/Volume Action  01/05/19 1011  1 mg Given    lidocaine (PF) (XYLOCAINE) 1 % injection (mL) Total volume:  17 mL Date/Time  Rate/Dose/Volume Action  01/05/19 1011  2 mL Given  1021  15 mL Given    Heparin (Porcine) in NaCl 1000-0.9 UT/500ML-% SOLN (mL) Total volume:  1,000 mL Date/Time  Rate/Dose/Volume Action  01/05/19 1012  500 mL Given  1012  500 mL Given    heparin injection (Units) Total dose:  8,000 Units Date/Time  Rate/Dose/Volume Action  01/05/19 1024  6,000 Units Given  1108  2,000 Units Given    nitroGLYCERIN 1 mg/10 mL (100 mcg/mL) - IR/CATH LAB (mcg) Total dose:  600 mcg Date/Time  Rate/Dose/Volume Action  01/05/19 1059  200 mcg Given  1108  200 mcg Given  1132  200 mcg Given    iohexol (OMNIPAQUE) 350 MG/ML injection (mL) Total volume:  125 mL Date/Time  Rate/Dose/Volume Action  01/05/19 1139  125 mL Given    Radial Cocktail/Verapamil only (mL) Total volume:  5 mL Date/Time  Rate/Dose/Volume Action  01/05/19 1016  5 mL Given    sodium chloride 0.9 % 1,000 mL with ViperSlide Lubricant 1 each (mL) Total volume:  200 mL Date/Time  Rate/Dose/Volume Action  01/05/19 1207  200 mL Given    sodium chloride 0.9 % 1,000 mL with ViperSlide Lubricant 1 each (mL) Total volume:  20 mL Date/Time  Rate/Dose/Volume Action  01/05/19 1140  20 mL Given    Sedation Time  Sedation Time Physician-1: 1 hour 30 minutes 1 second  Contrast  Medication Name Total Dose  iohexol (OMNIPAQUE)  350 MG/ML injection 125 mL    Radiation/Fluoro  Fluoro time: 14.2 (min) DAP: 46659 (mGycm2) Cumulative Air Kerma:  911 (mGy)  Coronary Findings  Diagnostic Dominance: Left Left Anterior Descending  Prox LAD lesion 80% stenosed  Prox LAD lesion is 80% stenosed.  Mid LAD lesion 40% stenosed  Mid LAD lesion is 40% stenosed.  First Diagonal Branch  1st Diag lesion 90% stenosed  1st Diag lesion is 90% stenosed. The lesion is type C and located at the major branch. The lesion is calcified. The stenosis was measured by a visual reading.  Ramus Intermedius  Ramus lesion 90% stenosed  Ramus lesion is 90% stenosed.  Intervention  Prox LAD lesion  Atherectomy  CATH VISTA GUIDE 6FR XB3.5 guide catheter was inserted. WIRE COUGAR XT STRL 190CM guidewire was used to cross lesion. Orbital atherectomy was performed using a CROWN DIAMONDBACK CLASSIC 1.25. 5 passes taken.  Stent  Pre-stent angioplasty was performed using a BALLOON SAPPHIRE 2.5X15. Maximum pressure: 12 atm. Inflation time: 60 sec. A drug-eluting stent was successfully placed using a STENT RESOLUTE ONYX 3.0X18. Maximum pressure: 12 atm. Inflation time: 60 sec. Stent strut is well apposed. Post-stent angioplasty was performed using a BALLOON SAPPHIRE [place] 3.5X8. Maximum pressure: 14 atm. Inflation time: 60 sec.  Post-Intervention Lesion Assessment  The intervention was successful. Pre-interventional TIMI flow is 3. Post-intervention TIMI flow is 3. Treated lesion length: 12 mm. No complications occurred at this lesion.  There is a 0% residual stenosis post intervention.  1st Diag lesion  Atherectomy  [place]IST[place] GUIDE 6FR XB3.5 guide catheter was inserted. WIRE VIPERWIRE COR FLEX TIP guidewire was used to cross lesion. Orbital atherectomy was performed using a CROWN DIAMONDBACK CLASSIC 1.25. 4 passes taken.  Angioplasty  Lesion length: 5 mm. WIRE COUGAR XT STRL 190CM guidewire used to cross lesion. Balloon angioplasty was performed using a  BALLOON SAPPHIRE 2.5X15. Maximum pressure: 12 atm. Inflation time: 60 sec.  Post-Intervention Lesion Assessment  The intervention was successful. Pre-interventional TIMI flow is 3. Post-intervention TIMI flow is 3. No complications occurred at this lesion.  There is a 20% residual stenosis post intervention.  Coronary Diagrams  Diagnostic Dominance: Left  Intervention     ADDENDUM REPORT: 01/01/2019 20:08  CLINICAL DATA:  Severe Aortic Stenosis.  EXAM: Cardiac TAVR CT  TECHNIQUE: The patient was scanned on a [employer]. A 120 kV retrospective scan was triggered in the descending thoracic aorta at 111 HU's. Gantry rotation speed was 250 msecs and collimation was .6 mm. No beta blockade or nitro were given. The 3D data set was reconstructed in 10% intervals of the R-R cycle. Systolic and diastolic phases were analyzed on a dedicated work station using MPR, MIP and VRT modes. The patient received 80 cc of contrast.  FINDINGS: Image quality: Excellent.  Noise artifact is: Limited.  Valve Morphology: Severely calcified/thickened aortic valve. Tricuspid with functional fusion of the RCC/NCC. Leaflet motion of the RCC/NCC is severely restricted.  Aortic Valve area: 0.63 cm2  Aortic Valve Calcium score: 1333 AU  Aortic annular dimension:  Phase assessed: 20%  Annular area: 3.03 cm2  Annular perimeter: 62.3 mm  Max diameter: 20.4 mm  Min diameter: 19.6 mm  Annular/Subannular calcification: No annular calcification and no subannular calcification.  Optimal coplanar projection: LAO 25 CAU 5  Coronary Artery Height above Annulus:  Left Main: 14.7 mm  Right Coronary: 15.4 mm  Sinus of Valsalva Measurements:  Non-coronary: 24.5 mm  Right-coronary: 24.2 mm  Left-coronary: 24.3 mm  Sinotubular Junction: 25.6 mm with mild calcifications.  Sinotubular Junction Height: 16.3 mm  Ascending Thoracic Aorta: 29 mm without  significant calcifications.  Coronary arteries: Study performed without NTG, and complete assessment not made. Would refer to recent cardiac cath for coronary assessment.  Coronary calcium score: 594 (78th percentile for age- and sex-matched controls).  Left main: The left main is a large caliber vessel with a normal take off from the left coronary cusp that trifurcates into a LAD, LCX, and ramus intermedius. There is no plaque or stenosis.  Left anterior descending artery: The proximal LAD contains heavily calcified plaque of indeterminate severity.  Ramus intermedius: Moderate to severe proximal stenosis.  Left circumflex artery: The LCX is dominant without evidence of proximal stenosis.  Right coronary artery: The RCA is non-dominant.  Cardiac Morphology:  Right Atrium: Right atrial size is within normal limits.  Right Ventricle: The right ventricular cavity is within normal limits.  Left Atrium: Left atrial size is normal in size with no left atrial appendage filling defect.  Left Ventricle: The ventricular cavity size is within normal limits. Sigmoid septum noted. There are no stigmata of prior infarction. There is no abnormal filling defect. Hyperdynamic LV function, EF estimated to be 85%. No regional wall motion abnormalities.  Pulmonary arteries: Normal in size without proximal filling defect.  Pulmonary veins: Normal pulmonary venous drainage. There were 3 noted pulmonary veins, 2 on the right and 1 on the left.  Pericardium: Normal thickness with no significant effusion or calcium present.  Mitral Valve: The mitral valve is thickened with moderate annular calcification.  Extra-cardiac findings: Very large hiatal hernia. Tortuous descending aorta. See attached radiology report for non-cardiac structures.  IMPRESSION: 1. Severe aortic stenosis. 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  2. Annular area 3.03 cm2  (perimeter 62.3 mm). No significant annular/subannular calcification noted.  3. Sufficient coronary to annulus distance.  4. Optimum Fluoroscopic Angle for Delivery: LAO 25 CAU 5  Eleonore Chiquito, MD   Electronically Signed   By: Eleonore Chiquito   On: 01/01/2019 20:08   Addended by Geralynn Rile, MD on 01/01/2019 8:10 PM    Study Result  EXAM: OVER-READ INTERPRETATION  CT CHEST  The following report is an over-read performed by radiologist Dr. Vinnie Langton of [facility], [place] on 01/01/2019. This over-read does not include interpretation of cardiac or coronary anatomy or pathology. The coronary calcium score/coronary CTA interpretation by the cardiologist is attached.  COMPARISON:  Chest CTA 09/12/2018.  FINDINGS: Extracardiac findings will be described separately under dictation for contemporaneously obtained CTA chest, abdomen and pelvis.  IMPRESSION: Please see separate dictation for contemporaneously obtained CTA chest, abdomen and pelvis dated 01/01/2019.  Electronically Signed: By: Vinnie Langton M.D. On: 01/01/2019 11:40       CLINICAL DATA:  86 year old female with history of severe aortic stenosis. Preprocedural study prior to potential transcatheter aortic valve replacement (TAVR) procedure.  EXAM: CT ANGIOGRAPHY CHEST, ABDOMEN AND PELVIS  TECHNIQUE: Multidetector CT imaging through the chest, abdomen and pelvis was performed using the standard protocol during bolus administration of intravenous contrast. Multiplanar reconstructed images and MIPs were obtained and reviewed to evaluate the vascular anatomy.  CONTRAST:  181m OMNIPAQUE IOHEXOL 350 MG/ML SOLN  COMPARISON:  Chest CTA 09/12/2018.  FINDINGS: CTA CHEST FINDINGS  Cardiovascular: Heart size is normal. There is no significant pericardial fluid, thickening or pericardial calcification. There is aortic atherosclerosis, as well as atherosclerosis of  the great vessels of the mediastinum and the coronary arteries, including calcified atherosclerotic plaque in the left main, left anterior descending and right coronary arteries. Thickening calcification of  the aortic valve. Calcifications of the mitral annulus.  Mediastinum/Lymph Nodes: No pathologically enlarged mediastinal or hilar lymph nodes. Large hiatal hernia. No axillary lymphadenopathy.  Lungs/Pleura: 8 x 6 mm nodule in the lateral segment of the right middle lobe (axial image 72 of series 8), similar to prior study 09/12/2018. No other larger more suspicious appearing pulmonary nodules or masses are noted. No acute consolidative airspace disease. No pleural effusions.  Musculoskeletal/Soft Tissues: Chronic compression fracture of T12 with 40% loss of anterior vertebral body height. There are no aggressive appearing lytic or blastic lesions noted in the visualized portions of the skeleton.  CTA ABDOMEN AND PELVIS FINDINGS  Hepatobiliary: Liver has a shrunken appearance and nodular contour, indicative of underlying cirrhosis. No suspicious cystic or solid hepatic lesions. No intra or extrahepatic biliary ductal dilatation. Status post cholecystectomy.  Pancreas: No pancreatic mass. No pancreatic ductal dilatation. No pancreatic or peripancreatic fluid collections or inflammatory changes.  Spleen: Unremarkable.  Adrenals/Urinary Tract: Subcentimeter low-attenuation lesion in the lateral aspect of the interpolar region of the right kidney, too small to characterize, but statistically likely to represent a cyst. Left kidney and bilateral adrenal glands are normal in appearance. No hydroureteronephrosis. Urinary bladder is normal in appearance.  Stomach/Bowel: Stomach is nearly completely intrathoracic. No pathologic dilatation of small bowel or colon. Normal appendix.  Vascular/Lymphatic: Aortic atherosclerosis, with vascular findings and measurements  pertinent to potential TAVR procedure, as detailed below. No lymphadenopathy noted in the abdomen or pelvis.  Reproductive: Status post hysterectomy. Ovaries are not confidently identified may be surgically absent or atrophic.  Other: No significant volume of ascites.  No pneumoperitoneum.  Musculoskeletal: There are no aggressive appearing lytic or blastic lesions noted in the visualized portions of the skeleton.  VASCULAR MEASUREMENTS PERTINENT TO TAVR:  AORTA:  Minimal Aortic Diameter-11 x 11 mm  Severity of Aortic Calcification-moderate to severe  RIGHT PELVIS:  Right Common Iliac Artery -  Minimal Diameter-8.0 x 7.2 mm  Tortuosity - mild  Calcification-mild  Right External Iliac Artery -  Minimal Diameter-7.1 x 7.2 mm  Tortuosity - mild  Calcification-none  Right Common Femoral Artery -  Minimal Diameter-6.9 x 7.0 mm  Tortuosity - mild  Calcification-mild  LEFT PELVIS:  Left Common Iliac Artery -  Minimal Diameter-6.7 x 8.3 mm  Tortuosity - mild  Calcification-mild  Left External Iliac Artery -  Minimal Diameter-6.7 x 7.1 mm  Tortuosity - mild  Calcification-none  Left Common Femoral Artery -  Minimal Diameter-7.1 x 6.9 mm  Tortuosity - mild  Calcification-mild  Review of the MIP images confirms the above findings.  IMPRESSION: 1. Vascular findings and measurements pertinent to potential TAVR procedure, as detailed above. 2. Calcifications of the aortic valve, compatible with the reported clinical history of severe aortic stenosis. 3. 8 x 6 mm nodule in the lateral segment of the right middle lobe, unchanged compared to the recent prior study from September 12, 2018. Non-contrast chest CT at 12 months is recommended. If the nodule is stable at time of repeat CT, then future CT at 18-24 months (from today's scan) is considered optional for low-risk patients, but is recommended for high-risk  patients. This recommendation follows the consensus statement: Guidelines for Management of Incidental Pulmonary Nodules Detected on CT Images: From the Fleischner Society 2017; Radiology 2017; 284:228-243. 4. Cirrhosis. 5. Additional incidental findings, as above   Electronically Signed   By: Vinnie Langton M.D.   On: 01/01/2019 13:15    STS Adult Cardiac Surgery Database Version 4.20  RISK SCORES  Procedure: AVR + CAB  Risk of Mortality:  6.233%  Renal Failure:  5.599%  Permanent Stroke:  2.630%  Prolonged Ventilation:  15.904%  DSW Infection:  0.149%  Reoperation:  4.949%  Morbidity or Mortality:  25.815%  Short Length of Stay:  12.378%  Long Length of Stay:  16.228%    Impression:   This 86 year old woman has stage D, severe, symptomatic aortic stenosis with New York Heart Association class III symptoms of exertional fatigue and shortness of breath consistent with chronic diastolic congestive heart failure. She also has significant proximal to mid LAD stenosis and an ostial large diagonal stenosis on cardiac catheterization but does not have any anginal symptoms. I have personally reviewed her 2D echocardiogram and cardiac catheterization studies. Her echocardiogram shows a trileaflet aortic valve with calcification of the valve leaflets and annulus with restricted leaflet mobility. The mean gradient was measured at 32 mmHg with a calculated valve area of 0.6 cm and a dimensionless index of 0.21 consistent with severe aortic stenosis. There is also moderate aortic insufficiency as well as moderate mitral regurgitation and mild to moderate tricuspid regurgitation. Left ventricular ejection fraction is normal. Cardiac catheterization shows a mean gradient of 23.6 mmHg across aortic valve with a calculated valve area of 0.76 cm. There was a proximal 80% LAD stenosis and a 90% ostial stenosis of a large diagonal branch that were successfully treated on 01/05/2019. There  is a 90% proximal stenosis and a small to medium sized ramus branch. I agree that aortic valve replacement is indicated in this active 86 year old with progresively worsening symptoms consistent with severe aortic stenosis. We discussed the possible options for treatment including open surgical aortic valve replacement with coronary artery bypass graft surgery, percutaneous intervention for the LAD stenosis followed by TAVR, and medical treatment for her coronary disease with TAVR. She decided to proceed with PCI and TAVR. Her gated cardiac CTA shows a small annular dimension and she would be best treated with a Medtronic Evolut valve. Her abdominal and pelvic CTA shows adequate pelvic vascular anatomy to allow transfemoral insertion.  The patient was counseled at length regarding treatment alternatives for management of severe symptomatic aortic stenosis. The risks and benefits of surgical intervention have been discussed in detail. Long-term prognosis with medical therapy was discussed. Alternative approaches such as conventional surgical aortic valve replacement, transcatheter aortic valve replacement, and palliative medical therapy were compared and contrasted at length. This discussion was placed in the context of the patient's own specific clinical presentation and past medical history. All of her questions have been addressed.   Following the decision to proceed with transcatheter aortic valve replacement, a discussion was held regarding what types of management strategies would be attempted intraoperatively in the event of life-threatening complications, including whether or not the patient would be considered a candidate for the use of cardiopulmonary bypass and/or conversion to open sternotomy for attempted surgical intervention. She is in good condition for her age and would be a candidate for emergent sternotomy to manage any intraoperative complications.   The patient has been advised of a variety  of complications that might develop including but not limited to risks of death, stroke, paravalvular leak, aortic dissection or other major vascular complications, aortic annulus rupture, device embolization, cardiac rupture or perforation, mitral regurgitation, acute myocardial infarction, arrhythmia, heart block or bradycardia requiring permanent pacemaker placement, congestive heart failure, respiratory failure, renal failure, pneumonia, infection, other late complications related to structural valve deterioration or migration, or other  complications that might ultimately cause a temporary or permanent loss of functional independence or other long term morbidity. The patient provides full informed consent for the procedure as described and all questions were answered.    Plan:   Transfemoral TAVR using a Medtronic Evolut valve.   Gaye Pollack, MD

## 2019-01-26 ENCOUNTER — Inpatient Hospital Stay (HOSPITAL_COMMUNITY): Payer: Medicare Other | Admitting: Anesthesiology

## 2019-01-26 ENCOUNTER — Other Ambulatory Visit: Payer: Self-pay

## 2019-01-26 ENCOUNTER — Inpatient Hospital Stay (HOSPITAL_COMMUNITY): Payer: Medicare Other

## 2019-01-26 ENCOUNTER — Inpatient Hospital Stay (HOSPITAL_COMMUNITY): Payer: Medicare Other | Admitting: Vascular Surgery

## 2019-01-26 ENCOUNTER — Inpatient Hospital Stay (HOSPITAL_COMMUNITY)
Admission: RE | Admit: 2019-01-26 | Discharge: 2019-01-27 | DRG: 267 | Disposition: A | Discharge status: Home / Self Care | Payer: Medicare Other | Attending: Cardiovascular Disease | Admitting: Cardiovascular Disease

## 2019-01-26 ENCOUNTER — Encounter (HOSPITAL_COMMUNITY): Payer: Self-pay | Admitting: Cardiovascular Disease

## 2019-01-26 ENCOUNTER — Ambulatory Visit (HOSPITAL_COMMUNITY)
Admission: RE | Admit: 2019-01-26 | Discharge: 2019-01-26 | Disposition: A | Discharge status: Home / Self Care | Payer: Medicare Other | Source: Ambulatory Visit | Attending: Physician Assistant | Admitting: Physician Assistant

## 2019-01-26 ENCOUNTER — Encounter (HOSPITAL_COMMUNITY)
Admission: RE | Disposition: A | Discharge status: Home / Self Care | Payer: Self-pay | Source: Home / Self Care | Attending: Cardiovascular Disease

## 2019-01-26 DIAGNOSIS — Z79899 Other long term (current) drug therapy: Secondary | ICD-10-CM

## 2019-01-26 DIAGNOSIS — I110 Hypertensive heart disease with heart failure: Secondary | ICD-10-CM | POA: Diagnosis present

## 2019-01-26 DIAGNOSIS — Z952 Presence of prosthetic heart valve: Secondary | ICD-10-CM | POA: Diagnosis not present

## 2019-01-26 DIAGNOSIS — I351 Nonrheumatic aortic (valve) insufficiency: Secondary | ICD-10-CM | POA: Diagnosis not present

## 2019-01-26 DIAGNOSIS — Z9861 Coronary angioplasty status: Secondary | ICD-10-CM

## 2019-01-26 DIAGNOSIS — Z79890 Hormone replacement therapy (postmenopausal): Secondary | ICD-10-CM | POA: Diagnosis not present

## 2019-01-26 DIAGNOSIS — I272 Pulmonary hypertension, unspecified: Secondary | ICD-10-CM | POA: Diagnosis present

## 2019-01-26 DIAGNOSIS — I352 Nonrheumatic aortic (valve) stenosis with insufficiency: Principal | ICD-10-CM | POA: Diagnosis present

## 2019-01-26 DIAGNOSIS — E785 Hyperlipidemia, unspecified: Secondary | ICD-10-CM | POA: Diagnosis present

## 2019-01-26 DIAGNOSIS — Z955 Presence of coronary angioplasty implant and graft: Secondary | ICD-10-CM | POA: Diagnosis not present

## 2019-01-26 DIAGNOSIS — I5032 Chronic diastolic (congestive) heart failure: Secondary | ICD-10-CM | POA: Diagnosis present

## 2019-01-26 DIAGNOSIS — Z9049 Acquired absence of other specified parts of digestive tract: Secondary | ICD-10-CM

## 2019-01-26 DIAGNOSIS — Z006 Encounter for examination for normal comparison and control in clinical research program: Secondary | ICD-10-CM

## 2019-01-26 DIAGNOSIS — I2510 Atherosclerotic heart disease of native coronary artery without angina pectoris: Secondary | ICD-10-CM | POA: Diagnosis present

## 2019-01-26 DIAGNOSIS — Z7902 Long term (current) use of antithrombotics/antiplatelets: Secondary | ICD-10-CM

## 2019-01-26 DIAGNOSIS — I350 Nonrheumatic aortic (valve) stenosis: Secondary | ICD-10-CM

## 2019-01-26 DIAGNOSIS — Z881 Allergy status to other antibiotic agents status: Secondary | ICD-10-CM | POA: Diagnosis not present

## 2019-01-26 DIAGNOSIS — Z882 Allergy status to sulfonamides status: Secondary | ICD-10-CM | POA: Diagnosis not present

## 2019-01-26 DIAGNOSIS — Z886 Allergy status to analgesic agent status: Secondary | ICD-10-CM

## 2019-01-26 DIAGNOSIS — Z888 Allergy status to other drugs, medicaments and biological substances status: Secondary | ICD-10-CM

## 2019-01-26 DIAGNOSIS — R918 Other nonspecific abnormal finding of lung field: Secondary | ICD-10-CM | POA: Diagnosis present

## 2019-01-26 DIAGNOSIS — E039 Hypothyroidism, unspecified: Secondary | ICD-10-CM | POA: Diagnosis present

## 2019-01-26 DIAGNOSIS — K219 Gastro-esophageal reflux disease without esophagitis: Secondary | ICD-10-CM | POA: Diagnosis present

## 2019-01-26 DIAGNOSIS — K7460 Unspecified cirrhosis of liver: Secondary | ICD-10-CM | POA: Diagnosis present

## 2019-01-26 DIAGNOSIS — Z880 Allergy status to penicillin: Secondary | ICD-10-CM | POA: Diagnosis not present

## 2019-01-26 DIAGNOSIS — K449 Diaphragmatic hernia without obstruction or gangrene: Secondary | ICD-10-CM | POA: Diagnosis present

## 2019-01-26 DIAGNOSIS — Z953 Presence of xenogenic heart valve: Secondary | ICD-10-CM

## 2019-01-26 DIAGNOSIS — Z20822 Contact with and (suspected) exposure to covid-19: Secondary | ICD-10-CM | POA: Diagnosis present

## 2019-01-26 HISTORY — DX: Presence of prosthetic heart valve: Z95.2

## 2019-01-26 HISTORY — PX: TRANSCATHETER AORTIC VALVE REPLACEMENT, TRANSFEMORAL: SHX6400

## 2019-01-26 LAB — POCT I-STAT, CHEM 8
BUN: 12 mg/dL (ref 8–23)
Calcium, Ion: 1.17 mmol/L (ref 1.15–1.40)
Chloride: 103 mmol/L (ref 98–111)
Creatinine, Ser: 1 mg/dL (ref 0.44–1.00)
Glucose, Bld: 105 mg/dL — ABNORMAL HIGH (ref 70–99)
HCT: 28 % — ABNORMAL LOW (ref 36.0–46.0)
Hemoglobin: 9.5 g/dL — ABNORMAL LOW (ref 12.0–15.0)
Potassium: 3.9 mmol/L (ref 3.5–5.1)
Sodium: 137 mmol/L (ref 135–145)
TCO2: 23 mmol/L (ref 22–32)

## 2019-01-26 SURGERY — IMPLANTATION, AORTIC VALVE, TRANSCATHETER, FEMORAL APPROACH
Anesthesia: Monitor Anesthesia Care | Site: Chest

## 2019-01-26 MED ORDER — NITROGLYCERIN IN D5W 200-5 MCG/ML-% IV SOLN: 0.0000 ug/min | INTRAVENOUS | Status: DC

## 2019-01-26 MED ORDER — PROTAMINE SULFATE 10 MG/ML IV SOLN
INTRAVENOUS | Status: AC
  Filled 2019-01-26: qty 10

## 2019-01-26 MED ORDER — HEPARIN SODIUM (PORCINE) 1000 UNIT/ML IJ SOLN
INTRAMUSCULAR | Status: DC | PRN
  Administered 2019-01-26: 9000 [IU] via INTRAVENOUS

## 2019-01-26 MED ORDER — LORATADINE 10 MG PO TABS
10.0000 mg | ORAL_TABLET | Freq: Every day | ORAL | Status: DC
  Administered 2019-01-27: 10 mg via ORAL
  Filled 2019-01-26: qty 1

## 2019-01-26 MED ORDER — SODIUM CHLORIDE 0.9% FLUSH: 3.0000 mL | INTRAVENOUS | Status: DC | PRN

## 2019-01-26 MED ORDER — SODIUM CHLORIDE 0.9 % IV SOLN: INTRAVENOUS | Status: DC

## 2019-01-26 MED ORDER — SODIUM CHLORIDE 0.9 % IV SOLN
INTRAVENOUS | Status: DC | PRN
  Administered 2019-01-26: 500 mL

## 2019-01-26 MED ORDER — CHLORHEXIDINE GLUCONATE 4 % EX LIQD: 60.0000 mL | Freq: Once | CUTANEOUS | Status: DC

## 2019-01-26 MED ORDER — CLOPIDOGREL BISULFATE 75 MG PO TABS
75.0000 mg | ORAL_TABLET | Freq: Every day | ORAL | Status: DC
  Administered 2019-01-27: 75 mg via ORAL
  Filled 2019-01-26: qty 1

## 2019-01-26 MED ORDER — ATORVASTATIN CALCIUM 10 MG PO TABS
5.0000 mg | ORAL_TABLET | Freq: Every day | ORAL | Status: DC
  Administered 2019-01-27: 5 mg via ORAL
  Filled 2019-01-26: qty 1

## 2019-01-26 MED ORDER — ACETAMINOPHEN 325 MG PO TABS: 650.0000 mg | ORAL_TABLET | Freq: Four times a day (QID) | ORAL | Status: DC | PRN

## 2019-01-26 MED ORDER — LEVOTHYROXINE SODIUM 50 MCG PO TABS
50.0000 ug | ORAL_TABLET | Freq: Every day | ORAL | Status: DC
  Administered 2019-01-27: 50 ug via ORAL
  Filled 2019-01-26: qty 1

## 2019-01-26 MED ORDER — PHENYLEPHRINE HCL (PRESSORS) 10 MG/ML IV SOLN
INTRAVENOUS | Status: DC | PRN
  Administered 2019-01-26 (×3): 80 ug via INTRAVENOUS

## 2019-01-26 MED ORDER — ACETAMINOPHEN 650 MG RE SUPP: 650.0000 mg | Freq: Four times a day (QID) | RECTAL | Status: DC | PRN

## 2019-01-26 MED ORDER — MORPHINE SULFATE (PF) 2 MG/ML IV SOLN: 1.0000 mg | INTRAVENOUS | Status: DC | PRN

## 2019-01-26 MED ORDER — PROTAMINE SULFATE 10 MG/ML IV SOLN
INTRAVENOUS | Status: DC | PRN
  Administered 2019-01-26: 90 mg via INTRAVENOUS

## 2019-01-26 MED ORDER — LIDOCAINE HCL 1 % IJ SOLN
INTRAMUSCULAR | Status: AC
  Filled 2019-01-26: qty 20

## 2019-01-26 MED ORDER — LIDOCAINE HCL 1 % IJ SOLN
INTRAMUSCULAR | Status: DC | PRN
  Administered 2019-01-26: 16 mL

## 2019-01-26 MED ORDER — SODIUM CHLORIDE 0.9 % IV SOLN
INTRAVENOUS | Status: AC
  Filled 2019-01-26: qty 1.2

## 2019-01-26 MED ORDER — EZETIMIBE 10 MG PO TABS
10.0000 mg | ORAL_TABLET | Freq: Every day | ORAL | Status: DC
  Administered 2019-01-27: 10 mg via ORAL
  Filled 2019-01-26: qty 1

## 2019-01-26 MED ORDER — SODIUM CHLORIDE 0.9 % IV SOLN: INTRAVENOUS | Status: AC

## 2019-01-26 MED ORDER — PROTAMINE SULFATE 10 MG/ML IV SOLN
INTRAVENOUS | Status: AC
  Filled 2019-01-26: qty 25

## 2019-01-26 MED ORDER — SODIUM CHLORIDE 0.9 % IV SOLN: 250.0000 mL | INTRAVENOUS | Status: DC | PRN

## 2019-01-26 MED ORDER — METOPROLOL TARTRATE 5 MG/5ML IV SOLN: 2.5000 mg | INTRAVENOUS | Status: DC | PRN

## 2019-01-26 MED ORDER — VANCOMYCIN HCL IN DEXTROSE 1-5 GM/200ML-% IV SOLN: 1000.0000 mg | Freq: Once | INTRAVENOUS | Status: DC

## 2019-01-26 MED ORDER — PHENYLEPHRINE HCL-NACL 20-0.9 MG/250ML-% IV SOLN: 0.0000 ug/min | INTRAVENOUS | Status: DC

## 2019-01-26 MED ORDER — MIDAZOLAM HCL 2 MG/2ML IJ SOLN
INTRAMUSCULAR | Status: AC
  Filled 2019-01-26: qty 2

## 2019-01-26 MED ORDER — HEPARIN SODIUM (PORCINE) 1000 UNIT/ML IJ SOLN
INTRAMUSCULAR | Status: AC
  Filled 2019-01-26: qty 1

## 2019-01-26 MED ORDER — CHLORHEXIDINE GLUCONATE 0.12 % MT SOLN
15.0000 mL | Freq: Once | OROMUCOSAL | Status: AC
  Administered 2019-01-26: 15 mL via OROMUCOSAL
  Filled 2019-01-26: qty 15

## 2019-01-26 MED ORDER — CHLORHEXIDINE GLUCONATE 4 % EX LIQD: 30.0000 mL | CUTANEOUS | Status: DC

## 2019-01-26 MED ORDER — OXYCODONE HCL 5 MG PO TABS: 5.0000 mg | ORAL_TABLET | ORAL | Status: DC | PRN

## 2019-01-26 MED ORDER — ONDANSETRON HCL 4 MG/2ML IJ SOLN: 4.0000 mg | Freq: Four times a day (QID) | INTRAMUSCULAR | Status: DC | PRN

## 2019-01-26 MED ORDER — MORPHINE SULFATE (PF) 10 MG/ML IV SOLN: 1.0000 mg | INTRAVENOUS | Status: DC | PRN

## 2019-01-26 MED ORDER — SODIUM CHLORIDE 0.9% FLUSH
3.0000 mL | Freq: Two times a day (BID) | INTRAVENOUS | Status: DC
  Administered 2019-01-26: 3 mL via INTRAVENOUS

## 2019-01-26 MED ORDER — TRAMADOL HCL 50 MG PO TABS: 50.0000 mg | ORAL_TABLET | ORAL | Status: DC | PRN

## 2019-01-26 MED ORDER — IODIXANOL 320 MG/ML IV SOLN
INTRAVENOUS | Status: DC | PRN
  Administered 2019-01-26: 70.6 mL via INTRA_ARTERIAL

## 2019-01-26 MED ORDER — SODIUM CHLORIDE 0.9 % IV SOLN
INTRAVENOUS | Status: AC
  Filled 2019-01-26 (×3): qty 1.2

## 2019-01-26 SURGICAL SUPPLY — 87 items
ADH SKN CLS APL DERMABOND .7 (GAUZE/BANDAGES/DRESSINGS) ×1
APL PRP STRL LF DISP 70% ISPRP (MISCELLANEOUS) ×1
BAG DECANTER FOR FLEXI CONT (MISCELLANEOUS) IMPLANT
BAG SNAP BAND KOVER 36X36 (MISCELLANEOUS) ×2 IMPLANT
BLADE CLIPPER SURG (BLADE) IMPLANT
BLADE OSCILLATING /SAGITTAL (BLADE) IMPLANT
BLADE STERNUM SYSTEM 6 (BLADE) IMPLANT
BLADE SURG 10 STRL SS (BLADE) IMPLANT
CABLE ADAPT CONN TEMP 6FT (ADAPTER) ×2 IMPLANT
CATH DIAG EXPO 6F AL1 (CATHETERS) ×2 IMPLANT
CATH DIAG EXPO 6F VENT PIG 145 (CATHETERS) ×4 IMPLANT
CATH EXTERNAL FEMALE PUREWICK (CATHETERS) IMPLANT
CATH INFINITI 6F AL2 (CATHETERS) IMPLANT
CATH S G BIP PACING (CATHETERS) ×2 IMPLANT
CHLORAPREP W/TINT 26 (MISCELLANEOUS) ×2 IMPLANT
CLIP VESOCCLUDE MED 24/CT (CLIP) IMPLANT
CLIP VESOCCLUDE SM WIDE 24/CT (CLIP) IMPLANT
CONT SPEC 4OZ CLIKSEAL STRL BL (MISCELLANEOUS) ×4 IMPLANT
COVER BACK TABLE 80X110 HD (DRAPES) ×2 IMPLANT
COVER WAND RF STERILE (DRAPES) ×2 IMPLANT
DECANTER SPIKE VIAL GLASS SM (MISCELLANEOUS) ×2 IMPLANT
DERMABOND ADVANCED (GAUZE/BANDAGES/DRESSINGS) ×1
DERMABOND ADVANCED .7 DNX12 (GAUZE/BANDAGES/DRESSINGS) ×1 IMPLANT
DEVICE CLOSURE PERCLS PRGLD 6F (VASCULAR PRODUCTS) ×2 IMPLANT
DRAPE INCISE IOBAN 66X45 STRL (DRAPES) IMPLANT
DRSG TEGADERM 2-3/8X2-3/4 SM (GAUZE/BANDAGES/DRESSINGS) ×2 IMPLANT
DRSG TEGADERM 4X4.75 (GAUZE/BANDAGES/DRESSINGS) ×2 IMPLANT
DRYSEAL FLEXSHEATH 18FR 33CM (SHEATH) ×1
ELECT CAUTERY BLADE 6.4 (BLADE) IMPLANT
ELECT REM PT RETURN 9FT ADLT (ELECTROSURGICAL) ×4
ELECTRODE REM PT RTRN 9FT ADLT (ELECTROSURGICAL) ×2 IMPLANT
FELT TEFLON 6X6 (MISCELLANEOUS) IMPLANT
GAUZE SPONGE 4X4 12PLY STRL (GAUZE/BANDAGES/DRESSINGS) ×2 IMPLANT
GAUZE SPONGE 4X4 12PLY STRL LF (GAUZE/BANDAGES/DRESSINGS) ×4 IMPLANT
GLOVE BIO SURGEON STRL SZ7.5 (GLOVE) ×2 IMPLANT
GLOVE BIO SURGEON STRL SZ8 (GLOVE) IMPLANT
GLOVE EUDERMIC 7 POWDERFREE (GLOVE) IMPLANT
GLOVE ORTHO TXT STRL SZ7.5 (GLOVE) IMPLANT
GOWN STRL REUS W/ TWL LRG LVL3 (GOWN DISPOSABLE) IMPLANT
GOWN STRL REUS W/ TWL XL LVL3 (GOWN DISPOSABLE) ×1 IMPLANT
GOWN STRL REUS W/TWL LRG LVL3 (GOWN DISPOSABLE)
GOWN STRL REUS W/TWL XL LVL3 (GOWN DISPOSABLE) ×2
GUIDEWIRE CNFDA BRKR CVD (WIRE) ×2 IMPLANT
GUIDEWIRE SAFE TJ AMPLATZ EXST (WIRE) ×2 IMPLANT
INSERT FOGARTY SM (MISCELLANEOUS) IMPLANT
KIT BASIN OR (CUSTOM PROCEDURE TRAY) ×2 IMPLANT
KIT HEART LEFT (KITS) ×2 IMPLANT
KIT SUCTION CATH 14FR (SUCTIONS) IMPLANT
KIT TURNOVER KIT B (KITS) ×2 IMPLANT
LOOP VESSEL MAXI BLUE (MISCELLANEOUS) IMPLANT
LOOP VESSEL MINI RED (MISCELLANEOUS) IMPLANT
NS IRRIG 1000ML POUR BTL (IV SOLUTION) ×2 IMPLANT
PACK ENDO MINOR (CUSTOM PROCEDURE TRAY) ×2 IMPLANT
PAD ARMBOARD 7.5X6 YLW CONV (MISCELLANEOUS) ×4 IMPLANT
PAD ELECT DEFIB RADIOL ZOLL (MISCELLANEOUS) ×2 IMPLANT
PENCIL BUTTON HOLSTER BLD 10FT (ELECTRODE) IMPLANT
PERCLOSE PROGLIDE 6F (VASCULAR PRODUCTS) ×4
POSITIONER HEAD DONUT 9IN (MISCELLANEOUS) ×2 IMPLANT
SET MICROPUNCTURE 5F STIFF (MISCELLANEOUS) ×2 IMPLANT
SHEATH BRITE TIP 7FR 35CM (SHEATH) ×2 IMPLANT
SHEATH DRYSEAL FLEX 18FR 33CM (SHEATH) ×1 IMPLANT
SHEATH PINNACLE 6F 10CM (SHEATH) ×2 IMPLANT
SHEATH PINNACLE 8F 10CM (SHEATH) ×2 IMPLANT
SLEEVE REPOSITIONING LENGTH 30 (MISCELLANEOUS) ×2 IMPLANT
SPONGE LAP 18X18 RF (DISPOSABLE) ×2 IMPLANT
STOPCOCK MORSE 400PSI 3WAY (MISCELLANEOUS) ×4 IMPLANT
SUT ETHIBOND X763 2 0 SH 1 (SUTURE) IMPLANT
SUT GORETEX CV 4 TH 22 36 (SUTURE) IMPLANT
SUT GORETEX CV4 TH-18 (SUTURE) IMPLANT
SUT MNCRL AB 3-0 PS2 18 (SUTURE) IMPLANT
SUT PROLENE 5 0 C 1 36 (SUTURE) IMPLANT
SUT PROLENE 6 0 C 1 30 (SUTURE) IMPLANT
SUT SILK  1 MH (SUTURE) ×1
SUT SILK 1 MH (SUTURE) ×1 IMPLANT
SUT VIC AB 2-0 CT1 27 (SUTURE)
SUT VIC AB 2-0 CT1 TAPERPNT 27 (SUTURE) IMPLANT
SUT VIC AB 2-0 CTX 36 (SUTURE) IMPLANT
SUT VIC AB 3-0 SH 8-18 (SUTURE) IMPLANT
SYR 50ML LL SCALE MARK (SYRINGE) ×2 IMPLANT
SYR BULB IRRIGATION 50ML (SYRINGE) IMPLANT
SYR MEDRAD MARK V 150ML (SYRINGE) ×2 IMPLANT
TOWEL GREEN STERILE (TOWEL DISPOSABLE) ×4 IMPLANT
TRANSDUCER W/STOPCOCK (MISCELLANEOUS) ×4 IMPLANT
TRAY FOLEY SLVR 16FR TEMP STAT (SET/KITS/TRAYS/PACK) IMPLANT
VALVE AORTIC EVOLT PROPLUS 23 (Valve) ×2 IMPLANT
WIRE EMERALD 3MM-J .035X150CM (WIRE) ×2 IMPLANT
WIRE EMERALD 3MM-J .035X260CM (WIRE) ×2 IMPLANT

## 2019-01-26 NOTE — Progress Notes (Signed)
PHARMACY NOTE:  ANTIMICROBIAL RENAL DOSAGE ADJUSTMENT  Current antimicrobial regimen includes a mismatch between antimicrobial dosage and estimated renal function.  As per policy approved by the Pharmacy & Therapeutics and Medical Executive Committees, the antimicrobial dosage will be adjusted accordingly.  Current antimicrobial dosage:  Vancomycin 1000mg  IV x1  Indication: surgical prophylaxis  Renal Function:  Estimated Creatinine Clearance: 33.3 mL/min (by C-G formula based on SCr of 1 mg/dL).     Antimicrobial dosage has been changed to:  No further doses needed  Additional comments: Vancomycin 1250mg  given at 11:30am today. With CrCl ~ 30 this dose will cover for 24 hours   Thank you for allowing pharmacy to be a part of this patient's care.  Hildred Laser, PharmD Clinical Pharmacist **Pharmacist phone directory can now be found on [email or web address] ([email or web address]).  Listed under [place].

## 2019-01-26 NOTE — Interval H&P Note (Signed)
History and Physical Interval Note:  01/26/2019 10:54 AM  [name] [name]  has presented today for surgery, with the diagnosis of aortic stenosis.  The various methods of treatment have been discussed with the patient and family. After consideration of risks, benefits and other options for treatment, the patient has consented to  Procedure(s): TRANSCATHETER AORTIC VALVE REPLACEMENT, TRANSFEMORAL (N/A) as a surgical intervention.  The patient's history has been reviewed, patient examined, no change in status, stable for surgery.  I have reviewed the patient's chart and labs.  Questions were answered to the patient's satisfaction.     Gaye Pollack

## 2019-01-26 NOTE — Progress Notes (Signed)
  Echocardiogram 2D Echocardiogram has been performed.  [name] [name] 01/26/2019, 1:19 PM

## 2019-01-26 NOTE — Anesthesia Procedure Notes (Signed)
Procedure Name: MAC Date/Time: 01/26/2019 11:45 AM Performed by: Amadeo Garnet, CRNA Pre-anesthesia Checklist: Patient identified, Emergency Drugs available, Suction available and Patient being monitored Oxygen Delivery Method: Simple face mask Preoxygenation: Pre-oxygenation with 100% oxygen Induction Type: IV induction Placement Confirmation: positive ETCO2 Dental Injury: Teeth and Oropharynx as per pre-operative assessment

## 2019-01-26 NOTE — CV Procedure (Signed)
[facility]  TAVR OPERATIVE NOTE   Date of Procedure:  01/26/2019  Preoperative Diagnosis: Severe Aortic Stenosis   Postoperative Diagnosis: Same   Procedure:    Transcatheter Aortic Valve Replacement - Transfemoral Approach  Medtronic Evolut Pro Plus THV (size 23 mm, model #[id number], serial # [id number])   Co-Surgeons:  Lauree Chandler, MD and Gaye Pollack, MD   Anesthesiologist:  Fransisco Beau  Echocardiographer:              Johnsie Cancel  Pre-operative Echo Findings:  Severe aortic stenosis  Normal left ventricular systolic function  Post-operative Echo Findings:  Trivial paravalvular leak  Normal left ventricular systolic function  BRIEF CLINICAL NOTE AND INDICATIONS FOR SURGERY  Ms. Lopp is a 86 yo female with history of hyperlipidemia, HTN, CAD and severe aortic stenosis who I am asked to see today by Dr. Einar Gip for discussion of her aortic stenosis and possible TAVR. She has been followed by Dr. Einar Gip. She was seen in the CT surgery office on 12/23/18 by Dr. Cyndia Bent. She has had progressive exertional dyspnea and fatigue. Remote echo in 2015 with mild aortic stenosis. Most recent echo 10/01/18 with LVEF=59%. There is severe aortic stenosis with mean gradient 32 mmHg, AVA 0.6 cm2, dimensionless index 0.21. There is moderate aortic valve insufficiency. Cardiac catheterization 12/15/18 with severe, calcific disease in the proximal LAD with high grade disease in the ostium of the diagonal branch. The intermediate branch is small to medium in size with proximal 90% stenosis. The Circumflex has mild disease. The RCA is small and non-dominant. By cath the mean gradient across the aortic valve was 23.6 mmHg with AVA 0.76 cm2. She underwent PCI with orbital atherectomy of the proximal LAD and placement of a drug eluting stent. The Diagonal branch was treated with orbital atherectomy and balloon angioplasty. She is here today for TAVR.   During the course of the patient's  preoperative work up they have been evaluated comprehensively by a multidisciplinary team of specialists coordinated through the [facility] in the [facility].  They have been demonstrated to suffer from symptomatic severe aortic stenosis as noted above. The patient has been counseled extensively as to the relative risks and benefits of all options for the treatment of severe aortic stenosis including long term medical therapy, conventional surgery for aortic valve replacement, and transcatheter aortic valve replacement.  The patient has been independently evaluated by Dr. Cyndia Bent with CT surgery and they are felt to be at high risk for conventional surgical aortic valve replacement. The surgeon indicated the patient would be a poor candidate for conventional surgery. Based upon review of all of the patient's preoperative diagnostic tests they are felt to be candidate for transcatheter aortic valve replacement using the transfemoral approach as an alternative to high risk conventional surgery.    Following the decision to proceed with transcatheter aortic valve replacement, a discussion has been held regarding what types of management strategies would be attempted intraoperatively in the event of life-threatening complications, including whether or not the patient would be considered a candidate for the use of cardiopulmonary bypass and/or conversion to open sternotomy for attempted surgical intervention.  The patient has been advised of a variety of complications that might develop peculiar to this approach including but not limited to risks of death, stroke, paravalvular leak, aortic dissection or other major vascular complications, aortic annulus rupture, device embolization, cardiac rupture or perforation, acute myocardial infarction, arrhythmia, heart block or bradycardia requiring  permanent pacemaker placement, congestive heart failure, respiratory failure,  renal failure, pneumonia, infection, other late complications related to structural valve deterioration or migration, or other complications that might ultimately cause a temporary or permanent loss of functional independence or other long term morbidity.  The patient provides full informed consent for the procedure as described and all questions were answered preoperatively.    DETAILS OF THE OPERATIVE PROCEDURE  PREPARATION:   The patient is brought to the operating room on the above mentioned date and central monitoring was established by the anesthesia team including placement of a radial arterial line. The patient is placed in the supine position on the operating table.  Intravenous antibiotics are administered. Conscious sedation is used.   Baseline transthoracic echocardiogram was performed. The patient's chest, abdomen, both groins, and both lower extremities are prepared and draped in a sterile manner. A time out procedure is performed.  PERIPHERAL ACCESS:   Using the modified Seldinger technique, femoral arterial and venous access were obtained with placement of 6 Fr sheaths on the left side.  A pigtail diagnostic catheter was passed through the femoral arterial sheath under fluoroscopic guidance into the aortic root.  A temporary transvenous pacemaker catheter was passed through the femoral venous sheath under fluoroscopic guidance into the right ventricle.  The pacemaker was tested to ensure stable lead placement and pacemaker capture. Aortic root angiography was performed in order to determine the optimal angiographic angle for valve deployment.  TRANSFEMORAL ACCESS:  A micropuncture kit was used to gain access to the right femoral artery using u/s guidance. Position confirmed with angiography. Pre-closure with double ProGlide closure devices. The patient was heparinized systemically and ACT verified > 250 seconds.    A 18 Fr transfemoral DrySeal sheath was introduced into the right  femoral artery after progressively dilating over an Amplatz superstiff wire. An AL-1 catheter was used to direct a straight-tip exchange length wire across the native aortic valve into the left ventricle. This was exchanged out for a pigtail catheter and position was confirmed in the LV apex.   The pigtail catheter was then exchanged for a Confida stiff wire.    TRANSCATHETER HEART VALVE DEPLOYMENT:  An Medtronic Evolut Pro Plus THS size 23 mm was prepared per manufacturer's guidelines. The system is advanced into the ascending aorta. The valve is partially deployed once in the appropriate position across the aortic valve using transvenous pacing at 120 bpm. Position confirmed by echo and aortic root angiogram. The valve is fully deployed. There is felt to be trivial paravalvular leak and no central aortic insufficiency.  The patient's hemodynamic recovery following valve deployment is good.  The deployment system is removed. Echo demostrated acceptable post-procedural gradients, stable mitral valve function, and no AI.   PROCEDURE COMPLETION:  The sheath was then removed and closure devices were completed. Protamine was administered once femoral arterial repair was complete. The temporary pacemaker, pigtail catheters and femoral sheaths were removed with a Mynx closure device placed in the right femoral artery and manual pressure used for venous hemostasis.    The patient tolerated the procedure well and is transported to the surgical intensive care in stable condition. There were no immediate intraoperative complications. All sponge instrument and needle counts are verified correct at completion of the operation.   No blood products were administered during the operation.  The patient received a total of 70.6 mL of intravenous contrast during the procedure.  Lauree Chandler MD 01/26/2019 1:36 PM

## 2019-01-26 NOTE — Anesthesia Procedure Notes (Signed)
Arterial Line Insertion Start/End1/05/2019 9:30 AM, 01/26/2019 9:40 AM Performed by: Lance Coon, CRNA, CRNA  Preanesthetic checklist: patient identified, IV checked, site marked, risks and benefits discussed, surgical consent, monitors and equipment checked, pre-op evaluation, timeout performed and anesthesia consent Lidocaine 1% used for infiltration Right, radial was placed Catheter size: 20 G Hand hygiene performed , maximum sterile barriers used  and Seldinger technique used  Attempts: 2 Procedure performed without using ultrasound guided technique. Following insertion, dressing applied and Biopatch. Post procedure assessment: normal and unchanged  Patient tolerated the procedure well with no immediate complications.

## 2019-01-26 NOTE — OR Nursing (Signed)
Pt. Asked to keep her daughter updated. Her daughter is an Therapist, sports.

## 2019-01-26 NOTE — Transfer of Care (Signed)
Immediate Anesthesia Transfer of Care Note  Patient: [name] [name]  Procedure(s) Performed: TRANSCATHETER AORTIC VALVE REPLACEMENT, TRANSFEMORAL (N/A Chest)  Patient Location: Cath Lab  Anesthesia Type:MAC  Level of Consciousness: awake, alert  and oriented  Airway & Oxygen Therapy: Patient Spontanous Breathing and Patient connected to nasal cannula oxygen  Post-op Assessment: Report given to RN, Post -op Vital signs reviewed and stable and Patient moving all extremities  Post vital signs: Reviewed and stable  Last Vitals:  Vitals Value Taken Time  BP    Temp    Pulse    Resp    SpO2      Last Pain:  Vitals:   01/26/19 0835  PainSc: 0-No pain         Complications: No apparent anesthesia complications

## 2019-01-26 NOTE — Op Note (Signed)
[facility]   MULTIDISCIPLINARY HEART VALVE TEAM   TAVR OPERATIVE NOTE    [name] [name] [id number]   Date of Procedure:  01/26/2019  Preoperative Diagnosis: Severe Aortic Stenosis   Postoperative Diagnosis: Same   Procedure:    Transcatheter Aortic Valve Replacement - Percutaneous Right Transfemoral Approach  Medtronic Evolut Pro+ (size 23 mm,  serial # [id number])   Co-Surgeons:  Gaye Pollack, MD and Lauree Chandler, MD   Anesthesiologist:  Renold Don, MD  Echocardiographer:  Jenkins Rouge, MD  Pre-operative Echo Findings:  Severe aortic stenosis  Normal left ventricular systolic function  Post-operative Echo Findings:  Trivial paravalvular leak  Normal left ventricular systolic function   BRIEF CLINICAL NOTE AND INDICATIONS FOR SURGERY  This 86 year old woman has stage D, severe, symptomatic aortic stenosis with New York Heart Association class III symptoms of exertional fatigue and shortness of breath consistent with chronic diastolic congestive heart failure. She also has significant proximal to mid LAD stenosis and an ostial large diagonal stenosis on cardiac catheterization but does not have any anginal symptoms. I have personally reviewed her 2D echocardiogram and cardiac catheterization studies. Her echocardiogram shows a trileaflet aortic valve with calcification of the valve leaflets and annulus with restricted leaflet mobility. The mean gradient was measured at 32 mmHg with a calculated valve area of 0.6 cm and a dimensionless index of 0.21 consistent with severe aortic stenosis. There is also moderate aortic insufficiency as well as moderate mitral regurgitation and mild to moderate tricuspid regurgitation. Left ventricular ejection fraction is normal. Cardiac catheterization shows a mean gradient of 23.6 mmHg across aortic valve with a calculated valve area of 0.76 cm. There was a proximal 80% LAD stenosis and a 90% ostial stenosis of a  large diagonal branch that were successfully treated on 01/05/2019. There is a 90% proximal stenosis and a small to medium sized ramus branch. I agree that aortic valve replacement is indicated in this active 86 year old with progresively worsening symptoms consistent with severe aortic stenosis. We discussed the possible options for treatment including open surgical aortic valve replacement with coronary artery bypass graft surgery, percutaneous intervention for the LAD stenosis followed by TAVR, and medical treatment for her coronary disease with TAVR. She decided to proceed with PCI and TAVR. Her gated cardiac CTA shows a small annular dimension and she would be best treated with a Medtronic Evolut valve. Her abdominal and pelvic CTA shows adequate pelvic vascular anatomy to allow transfemoral insertion.  The patient was counseled at length regarding treatment alternatives for management of severe symptomatic aortic stenosis. The risks and benefits of surgical intervention have been discussed in detail. Long-term prognosis with medical therapy was discussed. Alternative approaches such as conventional surgical aortic valve replacement, transcatheter aortic valve replacement, and palliative medical therapy were compared and contrasted at length. This discussion was placed in the context of the patient's own specific clinical presentation and past medical history. All of her questions have been addressed.   Following the decision to proceed with transcatheter aortic valve replacement, a discussion was held regarding what types of management strategies would be attempted intraoperatively in the event of life-threatening complications, including whether or not the patient would be considered a candidate for the use of cardiopulmonary bypass and/or conversion to open sternotomy for attempted surgical intervention. She is in good condition for her age and would be a candidate for emergent sternotomy to manage any  intraoperative complications.   The patient has been advised of a variety of  complications that might develop including but not limited to risks of death, stroke, paravalvular leak, aortic dissection or other major vascular complications, aortic annulus rupture, device embolization, cardiac rupture or perforation, mitral regurgitation, acute myocardial infarction, arrhythmia, heart block or bradycardia requiring permanent pacemaker placement, congestive heart failure, respiratory failure, renal failure, pneumonia, infection, other late complications related to structural valve deterioration or migration, or other complications that might ultimately cause a temporary or permanent loss of functional independence or other long term morbidity. The patient provides full informed consent for the procedure as described and all questions were answered.    DETAILS OF THE OPERATIVE PROCEDURE  PREPARATION:    The patient is brought to the operating room on the above mentioned date and central monitoring was established by the anesthesia team including placement of a radial arterial line. The patient is placed in the supine position on the operating table.  Intravenous antibiotics are administered.  The patient was monitored by anesthesia under conscious sedation. Baseline tranthoracicl echocardiogram was performed. The patient's abdomen and both groins are prepared and draped in a sterile manner. A time out procedure is performed.   PERIPHERAL ACCESS:    Using the modified Seldinger technique, femoral arterial and venous access was obtained with placement of 6 Fr sheaths on the right side.  A pigtail diagnostic catheter was passed through the right arterial sheath under fluoroscopic guidance into the aortic root.  A temporary transvenous pacemaker catheter was passed through the right femoral venous sheath under fluoroscopic guidance into the right ventricle.  The pacemaker was tested to ensure stable lead  placement and pacemaker capture.   TRANSFEMORAL ACCESS:   Percutaneous transfemoral access and sheath placement was performed using ultrasound guidance.  The right common femoral artery was cannulated using a micropuncture needle and appropriate location was verified using hand injection angiogram.  A pair of Abbott Perclose percutaneous closure devices were placed and a 8 French sheath replaced into the femoral artery.  The patient was heparinized systemically and ACT verified > 250 seconds.    An 7F Dry-Seal sheath was introduced into the right femoral artery after progressively dilating over an Amplatz superstiff wire. An AL-1 catheter was used to direct a straight-tip exchange length wire across the native aortic valve into the left ventricle. This was exchanged out for a pigtail catheter and position was confirmed in the LV apex. Simultaneous LV and Ao pressures were recorded.  The pigtail catheter was exchanged for a Confida wire in the LV apex.     BALLOON AORTIC VALVULOPLASTY:   Not performed   TRANSCATHETER HEART VALVE DEPLOYMENT:   A Medtronic Evolut Pro+ transcatheter heart valve (size 23 mm, serial [id number]) was prepared and crimped per manufacturer's guidelines, and the proper orientation of the valve is confirmed on the Medtronic EnVeo Pro delivery system. The valve was advanced through the introducer sheath using normal technique until in an appropriate position in the abdominal aorta beyond the sheath tip. The valve was then advanced across the aortic arch. The valve was carefully positioned across the aortic valve annulus. The paralax was removed from the delivery system by moving the detector into the LAO postion.  The valve is carefully deployed using the retractor dial so that 80% of the valve is released. TEE demonstrated trivial paravalvular AI and the patient's heart rhythm was stable. The valve was fully released during pacing at 120 bpm. There was felt to be trivial  paravalvular leak and no central aortic insufficiency. The patient's hemodynamic recovery following  valve deployment is good.  Post-procedure gradients were low. The delivery catheter and guidewire were both removed.    PROCEDURE COMPLETION:   The sheath was removed and femoral artery closure performed using the previously placed Perclose devices. Protamine was administered once femoral arterial repair was complete. The temporary pacemaker, pigtail catheter and femoral sheaths were removed with manual pressure used for hemostasis.   The patient tolerated the procedure well and is transported to the cath lab recovery area in stable condition. There were no immediate intraoperative complications. All sponge instrument and needle counts are verified correct at completion of the operation.   No blood products were administered during the operation.  The patient received a total of 70.6 mL of intravenous contrast during the procedure.   Gaye Pollack, MD

## 2019-01-27 ENCOUNTER — Inpatient Hospital Stay (HOSPITAL_COMMUNITY): Payer: Medicare Other

## 2019-01-27 DIAGNOSIS — I350 Nonrheumatic aortic (valve) stenosis: Secondary | ICD-10-CM

## 2019-01-27 DIAGNOSIS — Z952 Presence of prosthetic heart valve: Secondary | ICD-10-CM

## 2019-01-27 LAB — POCT I-STAT, CHEM 8
BUN: 12 mg/dL (ref 8–23)
BUN: 11 mg/dL (ref 8–23)
Calcium, Ion: 1.2 mmol/L (ref 1.15–1.40)
Calcium, Ion: 1.14 mmol/L — ABNORMAL LOW (ref 1.15–1.40)
Chloride: 103 mmol/L (ref 98–111)
Chloride: 103 mmol/L (ref 98–111)
Creatinine, Ser: 1.1 mg/dL — ABNORMAL HIGH (ref 0.44–1.00)
Creatinine, Ser: 1 mg/dL (ref 0.44–1.00)
Glucose, Bld: 101 mg/dL — ABNORMAL HIGH (ref 70–99)
Glucose, Bld: 112 mg/dL — ABNORMAL HIGH (ref 70–99)
HCT: 25 % — ABNORMAL LOW (ref 36.0–46.0)
HCT: 25 % — ABNORMAL LOW (ref 36.0–46.0)
Hemoglobin: 8.5 g/dL — ABNORMAL LOW (ref 12.0–15.0)
Hemoglobin: 8.5 g/dL — ABNORMAL LOW (ref 12.0–15.0)
Potassium: 4 mmol/L (ref 3.5–5.1)
Potassium: 4 mmol/L (ref 3.5–5.1)
Sodium: 137 mmol/L (ref 135–145)
Sodium: 135 mmol/L (ref 135–145)
TCO2: 25 mmol/L (ref 22–32)
TCO2: 24 mmol/L (ref 22–32)

## 2019-01-27 LAB — CBC
HCT: 24.9 % — ABNORMAL LOW (ref 36.0–46.0)
Hemoglobin: 8 g/dL — ABNORMAL LOW (ref 12.0–15.0)
MCH: 30 pg (ref 26.0–34.0)
MCHC: 32.1 g/dL (ref 30.0–36.0)
MCV: 93.3 fL (ref 80.0–100.0)
Platelets: 171 10*3/uL (ref 150–400)
RBC: 2.67 MIL/uL — ABNORMAL LOW (ref 3.87–5.11)
RDW: 14.3 % (ref 11.5–15.5)
WBC: 8 10*3/uL (ref 4.0–10.5)
nRBC: 0 % (ref 0.0–0.2)

## 2019-01-27 LAB — BASIC METABOLIC PANEL
Anion gap: 9 (ref 5–15)
BUN: 14 mg/dL (ref 8–23)
CO2: 20 mmol/L — ABNORMAL LOW (ref 22–32)
Calcium: 7.9 mg/dL — ABNORMAL LOW (ref 8.9–10.3)
Chloride: 105 mmol/L (ref 98–111)
Creatinine, Ser: 1.08 mg/dL — ABNORMAL HIGH (ref 0.44–1.00)
GFR calc Af Amer: 54 mL/min — ABNORMAL LOW (ref >60)
GFR calc non Af Amer: 47 mL/min — ABNORMAL LOW (ref >60)
Glucose, Bld: 85 mg/dL (ref 70–99)
Potassium: 3.7 mmol/L (ref 3.5–5.1)
Sodium: 134 mmol/L — ABNORMAL LOW (ref 135–145)

## 2019-01-27 LAB — ECHOCARDIOGRAM COMPLETE
Height: 60 in
Weight: 1974.4 oz

## 2019-01-27 LAB — MAGNESIUM: Magnesium: 1.6 mg/dL — ABNORMAL LOW (ref 1.7–2.4)

## 2019-01-27 NOTE — Progress Notes (Signed)
Progress Note  Patient Name: [name] [name] Date of Encounter: 01/27/2019  Primary Cardiologist: Einar Gip TAVR:Zygmunt Mcglinn/B[place]le  Subjective   No chest pain or dyspnea. No events overnight. She feels great.   Inpatient Medications    Scheduled Meds: . atorvastatin  5 mg Oral Daily  . clopidogrel  75 mg Oral Daily  . ezetimibe  10 mg Oral Daily  . levothyroxine  50 mcg Oral QAC breakfast  . loratadine  10 mg Oral Daily  . sodium chloride flush  3 mL Intravenous Q12H   Continuous Infusions: . sodium chloride 10 mL/hr at 01/26/19 0833  . sodium chloride    . nitroGLYCERIN    . phenylephrine (NEO-SYNEPHRINE) Adult infusion     PRN Meds: sodium chloride, acetaminophen **OR** acetaminophen, metoprolol tartrate, morphine injection, ondansetron (ZOFRAN) IV, oxyCODONE, sodium chloride flush, traMADol   Vital Signs    Vitals:   01/26/19 1925 01/27/19 0015 01/27/19 0435 01/27/19 0439  BP: 118/70 123/69 (!) 125/58   Pulse: 79 85 84   Resp: 12 15 13    Temp: 98.1 F (36.7 C) 98.2 F (36.8 C) 98.4 F (36.9 C)   TempSrc:  Oral Oral   SpO2: 96% 99% 97%   Weight:    56 kg  Height:        Intake/Output Summary (Last 24 hours) at 01/27/2019 0747 Last data filed at 01/27/2019 0300 Gross per 24 hour  Intake 1424.81 ml  Output --  Net 1424.81 ml   Last 3 Weights 01/27/2019 01/26/2019 01/26/2019  Weight (lbs) 123 lb 6.4 oz 132 lb 7.9 oz 122 lb  Weight (kg) 55.974 kg 60.1 kg 55.339 kg      Telemetry    Sinus - Personally Reviewed  ECG    No AM EKG - Personally Reviewed  Physical Exam   GEN: No acute distress.   Neck: No JVD Cardiac: RRR, no murmurs, rubs, or gallops.  Respiratory: Clear to auscultation bilaterally. GI: Soft, nontender, non-distended  MS: No edema; No deformity. Neuro:  Nonfocal  Psych: Normal affect   Labs    High Sensitivity Troponin:  No results for input(s): TROPONINIHS in the last 720 hours.    Chemistry Recent Labs  Lab 01/21/19 1020 01/26/19 1345  01/27/19 0333  NA 133* 137 134*  K 4.3 3.9 3.7  CL 100 103 105  CO2 22  --  20*  GLUCOSE 105* 105* 85  BUN 13 12 14   CREATININE 1.23* 1.00 1.08*  CALCIUM 9.2  --  7.9*  PROT 6.9  --   --   ALBUMIN 3.6  --   --   AST 59*  --   --   ALT 39  --   --   ALKPHOS 386*  --   --   BILITOT 0.6  --   --   GFRNONAA 40*  --  47*  GFRAA 46*  --  54*  ANIONGAP 11  --  9     Hematology Recent Labs  Lab 01/21/19 1020 01/26/19 1345 01/27/19 0333  WBC 8.0  --  8.0  RBC 3.13*  --  2.67*  HGB 9.6* 9.5* 8.0*  HCT 29.9* 28.0* 24.9*  MCV 95.5  --  93.3  MCH 30.7  --  30.0  MCHC 32.1  --  32.1  RDW 14.8  --  14.3  PLT 304  --  171    BNP Recent Labs  Lab 01/21/19 1020  BNP 181.9*     DDimer No results for  input(s): DDIMER in the last 168 hours.   Radiology    DG Chest Port 1 View  Result Date: 01/26/2019 CLINICAL DATA:  Status post trans catheter aortic valve replacement. EXAM: PORTABLE CHEST 1 VIEW COMPARISON:  Chest x-ray 01/21/2019. FINDINGS: Aortic valve replacement. Heart size stable. No pulmonary venous congestion. The lungs are clear. No pleural effusion or pneumothorax. Large sliding hiatal hernia again noted. IMPRESSION: 1. Aortic valve replacement. Heart size stable. No pulmonary venous congestion. 2. No acute pulmonary disease. Large sliding hiatal hernia again noted. Electronically Signed   By: Marcello Moores  Register   On: 01/26/2019 15:12   ECHOCARDIOGRAM LIMITED  Result Date: 01/26/2019   ECHOCARDIOGRAM LIMITED REPORT   Patient Name:   [name] [name] Date of Exam: 01/26/2019 Medical Rec #:  [id number]     Height:       60.0 in Accession #:    [id number]    Weight:       122.0 lb Date of Birth:  Aug 13, 1931     BSA:          1.51 m Patient Age:    86 years      BP:           128/95 mmHg Patient Gender: F             HR:           71 bpm. Exam Location:  Inpatient  Procedure: Limited Echo, Cardiac Doppler and Color Doppler Indications:     Aortic stenosis  History:         Patient has  prior history of Echocardiogram examinations, most                  recent 10/01/2018. Aortic Valve: A 9mm Medtronic Medtronic,                  stented aortic valve (TAVR) Procedure Date: 01/26/2019  Sonographer:     Dustin Flock Referring Phys:  [id number] [place] R THOMPSON Diagnosing Phys: Jenkins Rouge MD IMPRESSIONS  1. Left ventricular ejection fraction, by visual estimation, is 60 to 65%. The left ventricle has normal function. Left ventricular septal wall thickness was moderately increased. There is moderately increased left ventricular wall thickness.  2. Small left ventricular internal cavity size.  3. The left ventricle has no regional wall motion abnormalities.  4. Global right ventricle has normal systolc function.The right ventricular size is normal. no increase in right ventricular wall thickness.  5. Left atrial size was not assessed.  6. Moderate mitral annular calcification.  7. The mitral valve is degenerative. Mild mitral valve regurgitation.  8. The tricuspid valve was not assessed.  9. Severe aortic valve stenosis. 10. Pre TAVR: trileaflet AV with severe AS and mild AR. Peak velocity 4 m/sec mean gradient 36 mmHg peak 65 mmHg AVA 0.55 cm2         Post TAVR: well positioned Medtronic supra annular Evolut valve. Peak velocity 1.6 m/sec mean gradient 6 mmhg peak 10 mmHg AVA 2.1 cm2. 2 mild jets of PVL at 6:00 and 7:00 on BSA views. 11. Aortic root could not be assessed. 12. The interatrial septum was not assessed. FINDINGS  Left Ventricle: Left ventricular ejection fraction, by visual estimation, is 60 to 65%. The left ventricle has normal function. The left ventricle has no regional wall motion abnormalities. The left ventricular internal cavity size was the LV cavity size is small. Left ventricular septal wall thickness was moderately increased. There is moderately increased  left ventricular wall thickness. Right Ventricle: The right ventricular size is normal. No increase in right ventricular  wall thickness. Global RV systolic function is has normal systolic function. Left Atrium: Left atrial size was not assessed. Right Atrium: Right atrial size was not assessed. Pericardium: There is no evidence of pericardial effusion is seen. There is no evidence of pericardial effusion. Mitral Valve: The mitral valve is degenerative in appearance. Moderate mitral annular calcification. Mild mitral valve regurgitation. Tricuspid Valve: The tricuspid valve is not assessed. Aortic Valve: Aortic valve regurgitation is mild. Severe aortic stenosis is present. Aortic valve mean gradient measures 6.0 mmHg. Aortic valve peak gradient measures 10.2 mmHg. Aortic valve area, by VTI measures 2.07 cm. 41mm Medtronic Medtronic, stented aortic valve (TAVR) valve is present in the aortic position. Procedure Date: 01/26/2019. Pre TAVR: trileaflet AV with severe AS and mild AR. Peak velocity 4 m/sec mean gradient 36 mmHg peak 65 mmHg AVA 0.55 cm2 Post TAVR: well positioned Medtronic supra annular Evolut valve. Peak velocity 1.6 m/sec mean gradient 6 mmhg peak 10 mmHg AVA 2.1 cm2. 2 mild jets of PVL at 6:00 and 7:00 on BSA views. Pulmonic Valve: The pulmonic valve was not assessed. Aorta: Aortic root could not be assessed. Shunts: The interatrial septum was not assessed.  LEFT VENTRICLE          Normals PLAX 2D LVOT diam:     2.00 cm  2.0 cm LVOT Area:     3.14 cm 3.14 cm2  AORTIC VALVE                    Normals AV Area (Vmax):    1.89 cm AV Area (Vmean):   1.86 cm     3.06 cm2 AV Area (VTI):     2.07 cm AV Vmax:           160.00 cm/s AV Vmean:          107.000 cm/s 77 cm/s AV VTI:            0.321 m      3.15 cm2 AV Peak Grad:      10.2 mmHg AV Mean Grad:      6.0 mmHg     3 mmHg LVOT Vmax:         96.10 cm/s LVOT Vmean:        63.400 cm/s  75 cm/s LVOT VTI:          0.211 m      25.3 cm LVOT/AV VTI ratio: 0.66         1  SHUNTS Systemic VTI:  0.21 m Systemic Diam: 2.00 cm  Jenkins Rouge MD Electronically signed by Jenkins Rouge  MD Signature Date/Time: 01/26/2019/2:40:55 PMThe mitral valve is degenerative in appearance.    Final    Structural Heart Procedure  Result Date: 01/26/2019 See surgical note for result.   Cardiac Studies     Patient Profile     86 y.o. female with history of HLD, HTN, CAD and severe aortic stenosis who underwent TAVR with placement of a 23 mm Medtronic Evolut Pro THV from the right transfemoral approach on 01/26/19.   Assessment & Plan    1. Severe aortic stenosis: Doing well one day post TAVR. Will plan echo this morning. Mild drop in H/H due to expected procedural blood loss. BP stable. Sinus on telemetry. If echo is ok, will d/c home today and plan f/u in one week with Nell Range, PA in structural  clinic. Continue Plavix. No ASA given her ASA allergy.   For questions or updates, please contact [place] Please consult [email or web address] for contact info under        Signed, Lauree Chandler, MD  01/27/2019, 7:47 AM

## 2019-01-27 NOTE — Discharge Summary (Signed)
Discharge Summary  Patient ID: [name] [name]  MRN: [id number]; DOB: 08/14/1931  Admit date: 01/26/2019  Discharge date: 01/27/2019  Primary Care Provider: Kelton Pillar, MD  Primary Cardiologist: Adrian Prows, MD  Primary Electrophysiologist: None  Structural Heart: Lauree Chandler, MD  Discharge Diagnoses  Active Problems:  Severe aortic stenosis   Allergies       Allergies  Allergen Reactions   Aspirin Anaphylaxis   Ceclor [Cefaclor] Anaphylaxis   Statins Other (See Comments)    Elevated liver enzymes   Penicillins Rash    Has patient had a PCN reaction causing immediate rash, facial/tongue/throat swelling, SOB or lightheadedness with hypotension: Yes  Has patient had a PCN reaction causing severe rash involving mucus membranes or skin necrosis: No  Has patient had a PCN reaction that required hospitalization No  Has patient had a PCN reaction occurring within the last 10 years: No  If all of the above answers are "NO", then may proceed with Cephalosporin use.   Sulfa Antibiotics Rash   Diagnostic Studies/Procedures  Procedure: 01/26/2019  Transcatheter Aortic Valve Replacement - Percutaneous Right Transfemoral Approach Medtronic Evolut Pro+ (size 23 mm, serial # [id number])  PERIPHERAL ACCESS:  Using the modified Seldinger technique, femoral arterial and venous access was obtained with placement of 6 Fr sheaths on the right side. A pigtail diagnostic catheter was passed through the right arterial sheath under fluoroscopic guidance into the aortic root. A temporary transvenous pacemaker catheter was passed through the right femoral venous sheath under fluoroscopic guidance into the right ventricle. The pacemaker was tested to ensure stable lead placement and pacemaker capture.  TRANSFEMORAL ACCESS:  Percutaneous transfemoral access and sheath placement was performed using ultrasound guidance. The right common femoral artery was cannulated using a micropuncture needle and  appropriate location was verified using hand injection angiogram. A pair of Abbott Perclose percutaneous closure devices were placed and a 8 French sheath replaced into the femoral artery. The patient was heparinized systemically and ACT verified > 250 seconds.  An 57F Dry-Seal sheath was introduced into the right femoral artery after progressively dilating over an Amplatz superstiff wire. An AL-1 catheter was used to direct a straight-tip exchange length wire across the native aortic valve into the left ventricle. This was exchanged out for a pigtail catheter and position was confirmed in the LV apex. Simultaneous LV and Ao pressures were recorded. The pigtail catheter was exchanged for a Confida wire in the LV apex.  BALLOON AORTIC VALVULOPLASTY:  Not performed  TRANSCATHETER HEART VALVE DEPLOYMENT:  A Medtronic Evolut Pro+ transcatheter heart valve (size 23 mm, serial [id number]) was prepared and crimped per manufacturer's guidelines, and the proper orientation of the valve is confirmed on the Medtronic EnVeo Pro delivery system. The valve was advanced through the introducer sheath using normal technique until in an appropriate position in the abdominal aorta beyond the sheath tip. The valve was then advanced across the aortic arch. The valve was carefully positioned across the aortic valve annulus. The paralax was removed from the delivery system by moving the detector into the LAO postion. The valve is carefully deployed using the retractor dial so that 80% of the valve is released. TEE demonstrated trivial paravalvular AI and the patient's heart rhythm was stable. The valve was fully released during pacing at 120 bpm. There was felt to be trivial paravalvular leak and no central aortic insufficiency. The patient's hemodynamic recovery following valve deployment is good. Post-procedure gradients were low. The delivery catheter and guidewire were both removed.  PROCEDURE COMPLETION:  The sheath was removed  and femoral artery closure performed using the previously placed Perclose devices. Protamine was administered once femoral arterial repair was complete. The temporary pacemaker, pigtail catheter and femoral sheaths were removed with manual pressure used for hemostasis.  The patient tolerated the procedure well and is transported to the cath lab recovery area in stable condition. There were no immediate intraoperative complications. All sponge instrument and needle counts are verified correct at completion of the operation.  No blood products were administered during the operation.  The patient received a total of 70.6 mL of intravenous contrast during the procedure.  _____________  History of Present Illness   [name] [name] is a 86 y.o. female with a history of hypertension and hyperlipidemia who presents with a several month history of progressive exertional dyspnea and fatigue. She is still doing her housework but is exhausted by lunchtime. She gets short of breath with walking any distance and has to stop to rest. She denies any chest pain or pressure. She has had no dizziness or syncope. She had a remote echocardiogram in 2015 which showed mildly calcified aortic valve leaflets with a mean gradient of 12 mmHg across aortic valve. Her most recent echocardiogram on 10/01/2018 showed a mean gradient of 32 mmHg with a calculated valve area of 0.6 cm. The dimensionless index was 0.21 consistent with moderate to severe aortic stenosis. There is also moderate aortic insufficiency. Left ventricular ejection fraction was 59%. There is grade 2 diastolic dysfunction. She subsequently underwent cardiac catheterization on 12/15/2018 which showed a proximal 80% LAD stenosis just before a large first diagonal which had ostial 90% stenosis. There is a small to medium size intermediate branch with proximal 90% stenosis. The left circumflex had no significant stenosis. The right coronary had mild disease and was small and  nondominant. The mean gradient across aortic valve was measured at 23.6 mmHg with a calculated valve area of 0.76 cm. Cardiac index was 2.6. LVEDP was mildly elevated at 15 mmHg. She underwent successful orbital atherectomy and stenting of the proximal LAD with a DES and orbital atherectomy and balloon angioplasty of the diagonal branch.  Because of her stage D, severe, symptomatic aortic stenosis with New York Heart Association class III symptoms of exertional fatigue and shortness of breath consistent with chronic diastolic congestive heart failure, she was seen by Dr. Cyndia Bent.  Per his note: She also has significant proximal to mid LAD stenosis and an ostial large diagonal stenosis on cardiac catheterization but does not have any anginal symptoms. I have personally reviewed her 2D echocardiogram and cardiac catheterization studies. Her echocardiogram shows a trileaflet aortic valve with calcification of the valve leaflets and annulus with restricted leaflet mobility. The mean gradient was measured at 32 mmHg with a calculated valve area of 0.6 cm and a dimensionless index of 0.21 consistent with severe aortic stenosis. There is also moderate aortic insufficiency as well as moderate mitral regurgitation and mild to moderate tricuspid regurgitation. Left ventricular ejection fraction is normal. Cardiac catheterization shows a mean gradient of 23.6 mmHg across aortic valve with a calculated valve area of 0.76 cm. There was a proximal 80% LAD stenosis and a 90% ostial stenosis of a large diagonal branch that were successfully treated on 01/05/2019. There is a 90% proximal stenosis and a small to medium sized ramus branch. I agree that aortic valve replacement is indicated in this active 86 year old with progresively worsening symptoms consistent with severe aortic stenosis. We discussed the possible  options for treatment including open surgical aortic valve replacement with coronary artery bypass graft surgery,  percutaneous intervention for the LAD stenosis followed by TAVR, and medical treatment for her coronary disease with TAVR. She decided to proceed with PCI and TAVR. Her gated cardiac CTA shows a small annular dimension and she would be best treated with a Medtronic Evolut valve. Her abdominal and pelvic CTA shows adequate pelvic vascular anatomy to allow transfemoral insertion.  She came to the hospital on 01/26/2019 for TAVR.  Hospital Course  Consultants: Dr. Cyndia Bent  Ms. Stryker tolerated the procedure well and had an uneventful hospital course.  On 01/27/2019, she was seen by Dr. Angelena Form and all data were reviewed.  She had a post procedure echocardiogram. Trivial paravalvular leak was noted with normal gradients, EF greater than 75%. Complete results are below.  She was ambulating well. No further inpatient work-up is indicated and she is considered stable for discharge, to follow-up as an outpatient.  _____________  Discharge Vitals  Blood pressure 126/61, pulse 94, temperature 98.8 F (37.1 C), temperature source Oral, resp. rate 18, height 5' (1.524 m), weight 56 kg, SpO2 98 %.       Filed Weights   01/26/19 0808 01/26/19 1700 01/27/19 0439  Weight: 55.3 kg 60.1 kg 56 kg   Labs & Radiologic Studies  CBC  Recent Labs (last 2 labs)                                         Basic Metabolic Panel  Recent Labs (last 2 labs)                                                             _____________  Imaging Results (Last 48 hours)     Disposition  Pt is being discharged home today in good condition.  Follow-up Plans & Appointments      Follow-up Information     Eileen Stanford, PA-C Follow up on 02/03/2019.    Specialties: Cardiology, Radiology  Why: Keep appointment  Contact information:  [place] [address]  [phone]              Discharge Instructions     Amb Referral to Cardiac Rehabilitation  Complete by: As  directed    Diagnosis: Valve Replacement   Valve: Aortic Comment - TAVR   After initial evaluation and assessments completed: Virtual Based Care may be provided alone or in conjunction with Phase 2 Cardiac Rehab based on patient barriers.: Yes   Diet - low sodium heart healthy  Complete by: As directed    Increase activity slowly  Complete by: As directed       Discharge Medications        Allergies as of 01/27/2019      Reactions   Aspirin Anaphylaxis   Ceclor [cefaclor] Anaphylaxis   Statins Other (See Comments)   Elevated liver enzymes   Penicillins Rash   Has patient had a PCN reaction causing immediate rash, facial/tongue/throat swelling, SOB or lightheadedness with hypotension: Yes  Has patient had a PCN reaction causing severe rash involving mucus membranes or skin necrosis: No  Has  patient had a PCN reaction that required hospitalization No  Has patient had a PCN reaction occurring within the last 10 years: No  If all of the above answers are "NO", then may proceed with Cephalosporin use.   Sulfa Antibiotics Rash           Medication List     TAKE these medications    atorvastatin 10 MG tablet  Commonly known as: LIPITOR  Take 0.5 tablets (5 mg total) by mouth daily.   benzonatate 100 MG capsule  Commonly known as: TESSALON  Take 1-2 capsules (100-200 mg total) by mouth 3 (three) times daily as needed.   cetirizine 10 MG tablet  Commonly known as: ZYRTEC  Take 10 mg by mouth daily.   cholecalciferol 25 MCG (1000 UT) tablet  Commonly known as: VITAMIN D  Take 1,000 Units by mouth daily.   clopidogrel 75 MG tablet  Commonly known as: PLAVIX  Take 1 tablet (75 mg total) by mouth daily.   ezetimibe 10 MG tablet  Commonly known as: ZETIA  Take 10 mg by mouth daily.   levothyroxine 50 MCG tablet  Commonly known as: SYNTHROID  Take 50 mcg by mouth daily before breakfast.   meclizine 25 MG tablet  Commonly known as: ANTIVERT  Take 25 mg by mouth 3 (three)  times daily as needed for dizziness.   mometasone 50 MCG/ACT nasal spray  Commonly known as: NASONEX  Place 2 sprays into the nose every evening.   multivitamin with minerals Tabs tablet  Take 1 tablet by mouth 4 (four) times a week.   propranolol 40 MG tablet  Commonly known as: INDERAL  Take 0.5 tablets (20 mg total) by mouth 3 (three) times daily.   sodium chloride 0.65 % Soln nasal spray  Commonly known as: OCEAN  Place 1 spray into both nostrils daily.   Systane 0.4-0.3 % Soln  Generic drug: Polyethyl Glycol-Propyl Glycol  Place 1 drop into both eyes 3 (three) times daily as needed (dry/irritated eyes.).       Outstanding Labs/Studies  None  Duration of Discharge Encounter  Greater than 30 minutes including physician time.  Signed,  Rosaria Ferries, PA-C  01/27/2019, 10:46 AM

## 2019-01-27 NOTE — Progress Notes (Signed)
Pt ambulated this am without problems, feels well. Discussed restrictions, exercise at home and CRPII. Will refer to [facility] virtual program. Good reception. [id number] Yves Dill CES, ACSM 10:38 AM 01/27/2019  Pt is interested in participating in Virtual Cardiac and Pulmonary Rehab. Pt advised that Virtual Cardiac and Pulmonary Rehab is provided at no cost to the patient.  Checklist:  1. Pt has smart device  ie smartphone and/or ipad for downloading an app  Yes 2. Reliable internet/wifi service    Yes 3. Understands how to use their smartphone and navigate within an app.  Yes Pt verbalized understanding and is in agreement.

## 2019-01-27 NOTE — Anesthesia Postprocedure Evaluation (Signed)
Anesthesia Post Note  Patient: [name] [name]  Procedure(s) Performed: TRANSCATHETER AORTIC VALVE REPLACEMENT, TRANSFEMORAL (N/A Chest)     Patient location during evaluation: Cath Lab Anesthesia Type: MAC Level of consciousness: awake and alert Pain management: pain level controlled Vital Signs Assessment: post-procedure vital signs reviewed and stable Respiratory status: spontaneous breathing, nonlabored ventilation and respiratory function stable Cardiovascular status: stable and blood pressure returned to baseline Anesthetic complications: no    Last Vitals:  Vitals:   01/27/19 0015 01/27/19 0435  BP: 123/69 (!) 125/58  Pulse: 85 84  Resp: 15 13  Temp: 36.8 C 36.9 C  SpO2: 99% 97%    Last Pain:  Vitals:   01/27/19 0435  TempSrc: Oral  PainSc: 0-No pain                 Audry Pili

## 2019-01-27 NOTE — Progress Notes (Signed)
  Echocardiogram 2D Echocardiogram has been performed.  [name] [name] 01/27/2019, 8:54 AM

## 2019-01-27 NOTE — Progress Notes (Signed)
Pt provided discharge instructions and education. Pt iv removed and intact. Telebox removed/ccmd notified. Pt denies complaints. Vitals stable. Pt has all belongings. Pt tx via valet to meet ride.  Jerald Kief, RN

## 2019-01-27 NOTE — Progress Notes (Signed)
Discharge Summary    Patient ID: [name] [name] MRN: [id number]; DOB: [birth year]  Admit date: 01/26/2019 Discharge date: 01/27/2019  Primary Care Provider: Kelton Pillar, MD  Primary Cardiologist: Adrian Prows, MD  Primary Electrophysiologist:  None  Structural Heart: Lauree Chandler, MD   Discharge Diagnoses    Active Problems:   Severe aortic stenosis   Allergies Allergies  Allergen Reactions   Aspirin Anaphylaxis   Ceclor [Cefaclor] Anaphylaxis   Statins Other (See Comments)    Elevated liver enzymes   Penicillins Rash    Has patient had a PCN reaction causing immediate rash, facial/tongue/throat swelling, SOB or lightheadedness with hypotension: Yes Has patient had a PCN reaction causing severe rash involving mucus membranes or skin necrosis: No Has patient had a PCN reaction that required hospitalization No Has patient had a PCN reaction occurring within the last 10 years: No If all of the above answers are "NO", then may proceed with Cephalosporin use.   Sulfa Antibiotics Rash    Diagnostic Studies/Procedures    Procedure:      01/26/2019   Transcatheter Aortic Valve Replacement - Percutaneous Right Transfemoral Approach             Medtronic Evolut Pro+ (size 23 mm,  serial # [id number])   PERIPHERAL ACCESS:    Using the modified Seldinger technique, femoral arterial and venous access was obtained with placement of 6 Fr sheaths on the right side.  A pigtail diagnostic catheter was passed through the right arterial sheath under fluoroscopic guidance into the aortic root.  A temporary transvenous pacemaker catheter was passed through the right femoral venous sheath under fluoroscopic guidance into the right ventricle.  The pacemaker was tested to ensure stable lead placement and pacemaker capture.   TRANSFEMORAL ACCESS:   Percutaneous transfemoral access and sheath placement was performed using ultrasound guidance.  The right common femoral artery was  cannulated using a micropuncture needle and appropriate location was verified using hand injection angiogram.  A pair of Abbott Perclose percutaneous closure devices were placed and a 8 French sheath replaced into the femoral artery.  The patient was heparinized systemically and ACT verified > 250 seconds.    An 24F Dry-Seal sheath was introduced into the right femoral artery after progressively dilating over an Amplatz superstiff wire. An AL-1 catheter was used to direct a straight-tip exchange length wire across the native aortic valve into the left ventricle. This was exchanged out for a pigtail catheter and position was confirmed in the LV apex. Simultaneous LV and Ao pressures were recorded.  The pigtail catheter was exchanged for a Confida wire in the LV apex.     BALLOON AORTIC VALVULOPLASTY:   Not performed   TRANSCATHETER HEART VALVE DEPLOYMENT:   A Medtronic Evolut Pro+ transcatheter heart valve (size 23 mm, serial [id number]) was prepared and crimped per manufacturer's guidelines, and the proper orientation of the valve is confirmed on the Medtronic EnVeo Pro delivery system. The valve was advanced through the introducer sheath using normal technique until in an appropriate position in the abdominal aorta beyond the sheath tip. The valve was then advanced across the aortic arch. The valve was carefully positioned across the aortic valve annulus. The paralax was removed from the delivery system by moving the detector into the LAO postion.  The valve is carefully deployed using the retractor dial so that 80% of the valve is released. TEE demonstrated trivial paravalvular AI and the patient's heart rhythm was stable. The valve was fully released during  pacing at 120 bpm.There was felt to be trivialparavalvular leak and no central aortic insufficiency. The patient's hemodynamic recovery following valve deployment is good.  Post-procedure gradients were low. The delivery catheter and  guidewire were both removed.    PROCEDURE COMPLETION:   The sheath was removed and femoral artery closure performed using the previously placed Perclose devices. Protamine was administered once femoral arterial repair was complete. The temporary pacemaker, pigtail catheter and femoral sheaths were removed with manual pressure used for hemostasis.   The patient tolerated the procedure well and is transported to the cath lab recovery area in stable condition. There were no immediate intraoperative complications. All sponge instrument and needle counts are verified correct at completion of the operation.   No blood products were administered during the operation.  The patient received a total of 70.6 mL of intravenous contrast during the procedure.             _____________   History of Present Illness     [name] [name] is a 86 y.o. female with a history of hypertension and hyperlipidemia who presents with a several month history of progressive exertional dyspnea and fatigue. She is still doing her housework but is exhausted by lunchtime. She gets short of breath with walking any distance and has to stop to rest. She denies any chest pain or pressure. She has had no dizziness or syncope. She had a remote echocardiogram in 2015 which showed mildly calcified aortic valve leaflets with a mean gradient of 12 mmHg across aortic valve. Her most recent echocardiogram on 10/01/2018 showed a mean gradient of 32 mmHg with a calculated valve area of 0.6 cm. The dimensionless index was 0.21 consistent with moderate to severe aortic stenosis. There is also moderate aortic insufficiency. Left ventricular ejection fraction was 59%. There is grade 2 diastolic dysfunction. She subsequently underwent cardiac catheterization on 12/15/2018 which showed a proximal 80% LAD stenosis just before a large first diagonal which had ostial 90% stenosis. There is a small to medium size intermediate branch with proximal 90%  stenosis. The left circumflex had no significant stenosis. The right coronary had mild disease and was small and nondominant. The mean gradient across aortic valve was measured at 23.6 mmHg with a calculated valve area of 0.76 cm. Cardiac index was 2.6. LVEDP was mildly elevated at 15 mmHg. She underwent successful orbital atherectomy and stenting of the proximal LAD with a DES and orbital atherectomy and balloon angioplasty of the diagonal branch.   Because of her stage D, severe, symptomatic aortic stenosis with New York Heart Association class III symptoms of exertional fatigue and shortness of breath consistent with chronic diastolic congestive heart failure, she was seen by Dr. Cyndia Bent.   Per his note: She also has significant proximal to mid LAD stenosis and an ostial large diagonal stenosis on cardiac catheterization but does not have any anginal symptoms. I have personally reviewed her 2D echocardiogram and cardiac catheterization studies. Her echocardiogram shows a trileaflet aortic valve with calcification of the valve leaflets and annulus with restricted leaflet mobility. The mean gradient was measured at 32 mmHg with a calculated valve area of 0.6 cm and a dimensionless index of 0.21 consistent with severe aortic stenosis. There is also moderate aortic insufficiency as well as moderate mitral regurgitation and mild to moderate tricuspid regurgitation. Left ventricular ejection fraction is normal. Cardiac catheterization shows a mean gradient of 23.6 mmHg across aortic valve with a calculated valve area of 0.76 cm. There was a  proximal 80% LAD stenosis and a 90% ostial stenosis of a large diagonal branch that were successfully treated on 01/05/2019. There is a 90% proximal stenosis and a small to medium sized ramus branch. I agree that aortic valve replacement is indicated in this active 86 year old with progresively worsening symptoms consistent with severe aortic stenosis. We discussed the  possible options for treatment including open surgical aortic valve replacement with coronary artery bypass graft surgery, percutaneous intervention for the LAD stenosis followed by TAVR, and medical treatment for her coronary disease with TAVR. She decided to proceed with PCI and TAVR. Her gated cardiac CTA shows a small annular dimension and she would be best treated with a Medtronic Evolut valve. Her abdominal and pelvic CTA shows adequate pelvic vascular anatomy to allow transfemoral insertion.  She came to the hospital on 01/26/2019 for TAVR.   Hospital Course     Consultants: Dr. Cyndia Bent  Ms. Heo tolerated the procedure well and had an uneventful hospital course.  On 01/27/2019, she was seen by Dr. Angelena Form and all data were reviewed.  She had a post procedure echocardiogram.  Trivial paravalvular leak was noted with normal gradients, EF greater than 75%.  Complete results are below.  She was ambulating well.  No further inpatient work-up is indicated and she is considered stable for discharge, to follow-up as an outpatient.  _____________  Discharge Vitals Blood pressure 126/61, pulse 94, temperature 98.8 F (37.1 C), temperature source Oral, resp. rate 18, height 5' (1.524 m), weight 56 kg, SpO2 98 %.  Filed Weights   01/26/19 0808 01/26/19 1700 01/27/19 0439  Weight: 55.3 kg 60.1 kg 56 kg    Labs & Radiologic Studies    CBC Recent Labs    01/26/19 1345 01/27/19 0333  WBC  --  8.0  HGB 9.5* 8.0*  HCT 28.0* 24.9*  MCV  --  93.3  PLT  --  [id number]   Basic Metabolic Panel Recent Labs    01/26/19 1345 01/27/19 0333  NA 137 134*  K 3.9 3.7  CL 103 105  CO2  --  20*  GLUCOSE 105* 85  BUN 12 14  CREATININE 1.00 1.08*  CALCIUM  --  7.9*  MG  --  1.6*   _____________   DG Chest Port 1 View  Result Date: 01/26/2019 CLINICAL DATA:  Status post trans catheter aortic valve replacement. EXAM: PORTABLE CHEST 1 VIEW COMPARISON:  Chest x-ray 01/21/2019. FINDINGS: Aortic  valve replacement. Heart size stable. No pulmonary venous congestion. The lungs are clear. No pleural effusion or pneumothorax. Large sliding hiatal hernia again noted. IMPRESSION: 1. Aortic valve replacement. Heart size stable. No pulmonary venous congestion. 2. No acute pulmonary disease. Large sliding hiatal hernia again noted. Electronically Signed   By: Marcello Moores  Register   On: 01/26/2019 15:12   ECHOCARDIOGRAM COMPLETE  Result Date: 01/27/2019   ECHOCARDIOGRAM REPORT   Patient Name:   [name] [name] Date of Exam: 01/27/2019 Medical Rec #:  [id number]     Height:       60.0 in Accession #:    [id number]    Weight:       123.4 lb Date of Birth:  12-24-1931     BSA:          1.52 m Patient Age:    84 years      BP:           125/58 mmHg Patient Gender: F  HR:           93 bpm. Exam Location:  Inpatient Procedure: 2D Echo Indications:    post TAVR evaluation v43.3  History:        Patient has prior history of Echocardiogram examinations, most                 recent 01/25/2018. CAD; Risk Factors:Hypertension and                 Dyslipidemia. Severe AS. TAVR.  Sonographer:    Jannett Celestine RDCS (AE) Referring Phys: [id number] CHRISTOPHER D MCALHANY IMPRESSIONS  1. 23 mm Evolute Pro in aortic position. Vmax 2.2 m/s, MG 10.8 mmHG, EOA 1.50 cm2, DI 0.53. Trivial paravalvular leak in 6 o'clock position. Normal gradients.  2. Left ventricular ejection fraction, by visual estimation, is >75%. The left ventricle has hyperdynamic function. There is mildly increased left ventricular hypertrophy.  3. Hyperdynamic LV with peak intracavitary gradient ~36 mmHG.  4. Indeterminate diastolic filling due to E-A fusion.  5. The left ventricle has no regional wall motion abnormalities.  6. Global right ventricle has normal systolic function.The right ventricular size is normal. No increase in right ventricular wall thickness.  7. Left atrial size was normal.  8. Right atrial size was normal.  9. Presence of pericardial fat pad. 10.  Trivial pericardial effusion is present. 11. Mild to moderate mitral annular calcification. 12. The mitral valve is degenerative. No evidence of mitral valve regurgitation. Mild mitral stenosis. 13. Mild calcific mitral stenosis. Mean gradient 5.3 mmHG at 88 bpm. MVA 1.55 cm 2 by continuity. 14. The tricuspid valve is grossly normal. 15. Aortic valve regurgitation is not visualized. 16. The inferior vena cava is normal in size with <50% respiratory variability, suggesting right atrial pressure of 8 mmHg. 17. The pulmonic valve was not well visualized. Pulmonic valve regurgitation is not visualized. 18. No significant change from prior study. 19. TR signal is inadequate for assessing pulmonary artery systolic pressure. 20. A prior study was performed on 01/26/2019. 21. PVL appears trivial on this study. FINDINGS  Left Ventricle: Left ventricular ejection fraction, by visual estimation, is >75%. The left ventricle has hyperdynamic function. The left ventricle has no regional wall motion abnormalities. The left ventricular internal cavity size was the left ventricle is normal in size. There is mildly increased left ventricular hypertrophy. Asymmetric left ventricular hypertrophy of the basal-septal wall. Indeterminate diastolic filling due to E-A fusion. Hyperdynamic LV with peak intracavitary gradient ~36  mmHG. Right Ventricle: The right ventricular size is normal. No increase in right ventricular wall thickness. Global RV systolic function is has normal systolic function. Left Atrium: Left atrial size was normal in size. Right Atrium: Right atrial size was normal in size Pericardium: Trivial pericardial effusion is present. Presence of pericardial fat pad. Mitral Valve: The mitral valve is degenerative in appearance. Mild to moderate mitral annular calcification. No evidence of mitral valve regurgitation. Mild mitral valve stenosis by observation. Mild calcific mitral stenosis. Mean gradient 5.3 mmHG at 88  bpm. MVA  1.55 cm 2 by continuity. Tricuspid Valve: The tricuspid valve is grossly normal. Tricuspid valve regurgitation is mild. Aortic Valve: The aortic valve has been repaired/replaced. Aortic valve regurgitation is not visualized. Aortic valve mean gradient measures 10.8 mmHg. Aortic valve peak gradient measures 18.6 mmHg. Aortic valve area, by VTI measures 1.50 cm. 76mm Medtronic Medtronic, stented aortic valve (TAVR) valve is present in the aortic position. Procedure Date: 11/26/2018. 23 mm Evolute Pro in aortic  position. Vmax 2.2 m/s, MG 10.8 mmHG, EOA 1.50 cm2, DI 0.53. Trivial paravalvular leak in 6 o'clock position.  Normal gradients. Pulmonic Valve: The pulmonic valve was not well visualized. Pulmonic valve regurgitation is not visualized. Pulmonic regurgitation is not visualized. Aorta: The aortic root and ascending aorta are structurally normal, with no evidence of dilitation. Venous: The inferior vena cava is normal in size with less than 50% respiratory variability, suggesting right atrial pressure of 8 mmHg. IAS/Shunts: No atrial level shunt detected by color flow Doppler. Additional Comments: A prior study was performed on 01/26/2019.  LEFT VENTRICLE PLAX 2D LVIDd:         3.36 cm  Diastology LVIDs:         2.22 cm  LV e' lateral:   4.13 cm/s LV PW:         0.82 cm  LV E/e' lateral: 25.2 LV IVS:        1.11 cm  LV e' medial:    5.55 cm/s LVOT diam:     1.90 cm  LV E/e' medial:  18.7 LV SV:         30 ml LV SV Index:   19.04 LVOT Area:     2.84 cm  RIGHT VENTRICLE RV S prime:     14.70 cm/s TAPSE (M-mode): 1.4 cm LEFT ATRIUM             Index       RIGHT ATRIUM          Index LA diam:        3.10 cm 2.04 cm/m  RA Area:     9.69 cm LA Vol (A2C):   24.5 ml 16.12 ml/m RA Volume:   17.00 ml 11.18 ml/m LA Vol (A4C):   20.5 ml 13.48 ml/m LA Biplane Vol: 22.0 ml 14.47 ml/m  AORTIC VALVE AV Area (Vmax):    1.42 cm AV Area (Vmean):   1.38 cm AV Area (VTI):     1.50 cm AV Vmax:           215.77 cm/s AV Vmean:           152.173 cm/s AV VTI:            0.348 m AV Peak Grad:      18.6 mmHg AV Mean Grad:      10.8 mmHg LVOT Vmax:         108.00 cm/s LVOT Vmean:        74.200 cm/s LVOT VTI:          0.184 m LVOT/AV VTI ratio: 0.53  AORTA Ao Root diam: 2.30 cm MITRAL VALVE MV Area (PHT): 2.32 cm              SHUNTS MV Mean grad:  5.3 mmHg              Systemic VTI:  0.18 m MV VTI:        0.34 m                Systemic Diam: 1.90 cm MV PHT:        94.83 msec MV Decel Time: 327 msec MV E velocity: 104.00 cm/s 103 cm/s MV A velocity: 156.00 cm/s 70.3 cm/s MV E/A ratio:  0.67        1.5  Eleonore Chiquito MD Electronically signed by Eleonore Chiquito MD Signature Date/Time: 01/27/2019/9:47:48 AM    Final    ECHOCARDIOGRAM LIMITED  Result Date: 01/26/2019  ECHOCARDIOGRAM LIMITED REPORT   Patient Name:   [name] [name] Date of Exam: 01/26/2019 Medical Rec #:  [id number]     Height:       60.0 in Accession #:    [id number]    Weight:       122.0 lb Date of Birth:  1931/07/09     BSA:          1.51 m Patient Age:    13 years      BP:           128/95 mmHg Patient Gender: F             HR:           71 bpm. Exam Location:  Inpatient  Procedure: Limited Echo, Cardiac Doppler and Color Doppler Indications:     Aortic stenosis  History:         Patient has prior history of Echocardiogram examinations, most                  recent 10/01/2018. Aortic Valve: A 66mm Medtronic Medtronic,                  stented aortic valve (TAVR) Procedure Date: 01/26/2019  Sonographer:     Dustin Flock Referring Phys:  [id number] [place] R THOMPSON Diagnosing Phys: Jenkins Rouge MD IMPRESSIONS  1. Left ventricular ejection fraction, by visual estimation, is 60 to 65%. The left ventricle has normal function. Left ventricular septal wall thickness was moderately increased. There is moderately increased left ventricular wall thickness.  2. Small left ventricular internal cavity size.  3. The left ventricle has no regional wall motion abnormalities.  4. Global right  ventricle has normal systolc function.The right ventricular size is normal. no increase in right ventricular wall thickness.  5. Left atrial size was not assessed.  6. Moderate mitral annular calcification.  7. The mitral valve is degenerative. Mild mitral valve regurgitation.  8. The tricuspid valve was not assessed.  9. Severe aortic valve stenosis. 10. Pre TAVR: trileaflet AV with severe AS and mild AR. Peak velocity 4 m/sec mean gradient 36 mmHg peak 65 mmHg AVA 0.55 cm2         Post TAVR: well positioned Medtronic supra annular Evolut valve. Peak velocity 1.6 m/sec mean gradient 6 mmhg peak 10 mmHg AVA 2.1 cm2. 2 mild jets of PVL at 6:00 and 7:00 on BSA views. 11. Aortic root could not be assessed. 12. The interatrial septum was not assessed. FINDINGS  Left Ventricle: Left ventricular ejection fraction, by visual estimation, is 60 to 65%. The left ventricle has normal function. The left ventricle has no regional wall motion abnormalities. The left ventricular internal cavity size was the LV cavity size is small. Left ventricular septal wall thickness was moderately increased. There is moderately increased left ventricular wall thickness. Right Ventricle: The right ventricular size is normal. No increase in right ventricular wall thickness. Global RV systolic function is has normal systolic function. Left Atrium: Left atrial size was not assessed. Right Atrium: Right atrial size was not assessed. Pericardium: There is no evidence of pericardial effusion is seen. There is no evidence of pericardial effusion. Mitral Valve: The mitral valve is degenerative in appearance. Moderate mitral annular calcification. Mild mitral valve regurgitation. Tricuspid Valve: The tricuspid valve is not assessed. Aortic Valve: Aortic valve regurgitation is mild. Severe aortic stenosis is present. Aortic valve mean gradient measures 6.0 mmHg. Aortic valve peak gradient measures 10.2 mmHg. Aortic valve  area, by VTI measures 2.07 cm.  64mm Medtronic Medtronic, stented aortic valve (TAVR) valve is present in the aortic position. Procedure Date: 01/26/2019. Pre TAVR: trileaflet AV with severe AS and mild AR. Peak velocity 4 m/sec mean gradient 36 mmHg peak 65 mmHg AVA 0.55 cm2 Post TAVR: well positioned Medtronic supra annular Evolut valve. Peak velocity 1.6 m/sec mean gradient 6 mmhg peak 10 mmHg AVA 2.1 cm2. 2 mild jets of PVL at 6:00 and 7:00 on BSA views. Pulmonic Valve: The pulmonic valve was not assessed. Aorta: Aortic root could not be assessed. Shunts: The interatrial septum was not assessed.  LEFT VENTRICLE          Normals PLAX 2D LVOT diam:     2.00 cm  2.0 cm LVOT Area:     3.14 cm 3.14 cm2  AORTIC VALVE                    Normals AV Area (Vmax):    1.89 cm AV Area (Vmean):   1.86 cm     3.06 cm2 AV Area (VTI):     2.07 cm AV Vmax:           160.00 cm/s AV Vmean:          107.000 cm/s 77 cm/s AV VTI:            0.321 m      3.15 cm2 AV Peak Grad:      10.2 mmHg AV Mean Grad:      6.0 mmHg     3 mmHg LVOT Vmax:         96.10 cm/s LVOT Vmean:        63.400 cm/s  75 cm/s LVOT VTI:          0.211 m      25.3 cm LVOT/AV VTI ratio: 0.66         1  SHUNTS Systemic VTI:  0.21 m Systemic Diam: 2.00 cm  Jenkins Rouge MD Electronically signed by Jenkins Rouge MD Signature Date/Time: 01/26/2019/2:40:55 PMThe mitral valve is degenerative in appearance.    Final    Structural Heart Procedure  Result Date: 01/26/2019 See surgical note for result.   Disposition   Pt is being discharged home today in good condition.  Follow-up Plans & Appointments    Follow-up Information    Eileen Stanford, PA-C Follow up on 02/03/2019.   Specialties: Cardiology, Radiology Why: Keep appointment Contact information: [place] [id number] [phone]          Discharge Instructions    Amb Referral to Cardiac Rehabilitation   Complete by: As directed    Diagnosis: Valve Replacement   Valve: Aortic Comment -  TAVR   After initial evaluation and assessments completed: Virtual Based Care may be provided alone or in conjunction with Phase 2 Cardiac Rehab based on patient barriers.: Yes   Diet - low sodium heart healthy   Complete by: As directed    Increase activity slowly   Complete by: As directed       Discharge Medications   Allergies as of 01/27/2019      Reactions   Aspirin Anaphylaxis   Ceclor [cefaclor] Anaphylaxis   Statins Other (See Comments)   Elevated liver enzymes   Penicillins Rash   Has patient had a PCN reaction causing immediate rash, facial/tongue/throat swelling, SOB or lightheadedness with hypotension: Yes Has patient had a PCN reaction causing severe rash  involving mucus membranes or skin necrosis: No Has patient had a PCN reaction that required hospitalization No Has patient had a PCN reaction occurring within the last 10 years: No If all of the above answers are "NO", then may proceed with Cephalosporin use.   Sulfa Antibiotics Rash      Medication List    TAKE these medications   atorvastatin 10 MG tablet Commonly known as: LIPITOR Take 0.5 tablets (5 mg total) by mouth daily.   benzonatate 100 MG capsule Commonly known as: TESSALON Take 1-2 capsules (100-200 mg total) by mouth 3 (three) times daily as needed.   cetirizine 10 MG tablet Commonly known as: ZYRTEC Take 10 mg by mouth daily.   cholecalciferol 25 MCG (1000 UT) tablet Commonly known as: VITAMIN D Take 1,000 Units by mouth daily.   clopidogrel 75 MG tablet Commonly known as: PLAVIX Take 1 tablet (75 mg total) by mouth daily.   ezetimibe 10 MG tablet Commonly known as: ZETIA Take 10 mg by mouth daily.   levothyroxine 50 MCG tablet Commonly known as: SYNTHROID Take 50 mcg by mouth daily before breakfast.   meclizine 25 MG tablet Commonly known as: ANTIVERT Take 25 mg by mouth 3 (three) times daily as needed for dizziness.   mometasone 50 MCG/ACT nasal spray Commonly known as:  NASONEX Place 2 sprays into the nose every evening.   multivitamin with minerals Tabs tablet Take 1 tablet by mouth 4 (four) times a week.   propranolol 40 MG tablet Commonly known as: INDERAL Take 0.5 tablets (20 mg total) by mouth 3 (three) times daily.   sodium chloride 0.65 % Soln nasal spray Commonly known as: OCEAN Place 1 spray into both nostrils daily.   Systane 0.4-0.3 % Soln Generic drug: Polyethyl Glycol-Propyl Glycol Place 1 drop into both eyes 3 (three) times daily as needed (dry/irritated eyes.).          Outstanding Labs/Studies   None  Duration of Discharge Encounter   Greater than 30 minutes including physician time.  Signed, Rosaria Ferries, PA-C 01/27/2019, 10:46 AM

## 2019-01-28 ENCOUNTER — Telehealth: Payer: Self-pay

## 2019-01-28 NOTE — Telephone Encounter (Signed)
Patient contacted regarding discharge from [facility] on 01/27/2019.  Patient understands to follow up with provider Nell Range PA-C on 1/13/2021at 2:30 PM at [facility] location. Patient understands discharge instructions? yes Patient understands medications and regiment? yes Patient understands to bring all medications to this visit? Yes  The pt is doing well today with no questions or concerns at this time.

## 2019-01-28 NOTE — Telephone Encounter (Signed)
She should get it and not postpone

## 2019-01-29 ENCOUNTER — Telehealth (HOSPITAL_COMMUNITY): Payer: Self-pay

## 2019-01-29 ENCOUNTER — Ambulatory Visit: Payer: Federal, State, Local not specified - PPO | Admitting: Cardiology

## 2019-01-29 MED FILL — Heparin Sodium (Porcine) Inj 1000 Unit/ML: INTRAMUSCULAR | Qty: 30 | Status: AC

## 2019-01-29 MED FILL — Potassium Chloride Inj 2 mEq/ML: INTRAVENOUS | Qty: 40 | Status: AC

## 2019-01-29 MED FILL — Magnesium Sulfate Inj 50%: INTRAMUSCULAR | Qty: 10 | Status: AC

## 2019-01-29 NOTE — Telephone Encounter (Signed)
Will pass to RN Navigator for review. 

## 2019-01-29 NOTE — Telephone Encounter (Signed)
Due to department closure will wait to check pt's insurance once we reopen.  

## 2019-02-01 NOTE — Progress Notes (Signed)
[facility] [place]                                       Cardiology Office Note    Date:  02/03/2019   ID:  [name] [name], DOB March 18, 1931, MRN [id number]  PCP:  Kelton Pillar, MD  Cardiologist:  Dr. Einar Gip / Dr. Angelena Form & Dr. Cyndia Bent (TAVR)  CC: [facility] s/p TAVR   History of Present Illness:  [name] [name] is a 86 y.o. female with a history of HTN, HLD, anemia, CAD s/p recent atherectomy and PCI and POBA (01/05/19 by Dr. 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   Her most recent echocardiogram on 10/01/2018 showed EF 59%, severe LFLG AS with a mean gradient of 32 mmHg with a calculated valve area of 0.6 cm. She subsequently underwent cardiac catheterization on 12/15/2018 which showed a proximal 80% LAD stenosis just before a large first diagonal which had ostial 90% stenosis. There is a small to medium size intermediate branch with proximal 90% stenosis. The left circumflex had no significant stenosis. She underwent successful orbital atherectomy and stenting of the proximal LAD with a DES and orbital atherectomy and balloon angioplasty of the diagonal branch.   She was evaluated by the multidisciplinary valve team and felt to have severe, symptomatic aortic stenosis and to be a suitable candidate for TAVR. She underwent successful TAVR with a 54mm Medtronic Evolut Pro + via the TF approach on 01/26/19. Post op echo showed EF >75%, normally functioning TAVR with mean gradient of 10.8 mm Hg and trivial PVL. She was continued on monotherapy with plavix.   Today she presents to clinic for follow up. No CP or SOB. No LE edema, orthopnea or PND. No dizziness or syncope. No blood in stool or urine. No palpitations. She can tell a big difference in her breathing and exercise tolerance since TAVR. Has some non painful knots in groin.    Past Medical History:  Diagnosis Date  . Arthritis   . Asthma    history of  . CAD (coronary artery disease)   . Dyspnea    sometimes sitting.lying and on exertion  . GERD (gastroesophageal reflux disease)   . History of hiatal hernia   . Hyperlipidemia   . Hypertension   . Hypothyroidism   . Lipoma    left arm  . Nasal polyps   . Pneumonia   . Pulmonary nodule    noted on pre TAVR CT  . Severe aortic stenosis     Past Surgical History:  Procedure Laterality Date  . CHOLECYSTECTOMY  02/27/04  . CORONARY ATHERECTOMY N/A 01/05/2019   Procedure: CORONARY ATHERECTOMY;  Surgeon: Adrian Prows, MD;  Location: [place] [facility];  Service: Cardiovascular;  Laterality: N/A;  . CORONARY BALLOON ANGIOPLASTY N/A 01/05/2019   Procedure: CORONARY BALLOON ANGIOPLASTY;  Surgeon: Adrian Prows, MD;  Location: [place] [facility];  Service: Cardiovascular;  Laterality: N/A;  . CORONARY STENT INTERVENTION N/A 01/05/2019   Procedure: CORONARY STENT INTERVENTION;  Surgeon: Adrian Prows, MD;  Location: [place] [facility];  Service: Cardiovascular;  Laterality: N/A;  . CORONARY STENT INTERVENTION  01/05/2019  . INCONTINENCE SURGERY  09/12/09  . NASAL SINUS SURGERY     several    . ORIF ANKLE FRACTURE Right 09/11/2013   Procedure: OPEN REDUCTION  INTERNAL FIXATION (ORIF) ANKLE FRACTURE;  Surgeon: Augustin Schooling, MD;  Location: [facility];  Service: Orthopedics;  Laterality: Right;  . RIGHT/LEFT HEART CATH AND CORONARY ANGIOGRAPHY N/A 12/15/2018   Procedure: RIGHT/LEFT HEART CATH AND CORONARY ANGIOGRAPHY;  Surgeon: Adrian Prows, MD;  Location: [place] [facility];  Service: Cardiovascular;  Laterality: N/A;  . ROTATOR CUFF REPAIR  09/27/2003  . TRANSCATHETER AORTIC VALVE REPLACEMENT, TRANSFEMORAL  01/26/2019  . TRANSCATHETER AORTIC VALVE REPLACEMENT, TRANSFEMORAL N/A 01/26/2019   Procedure: TRANSCATHETER AORTIC VALVE REPLACEMENT, TRANSFEMORAL;  Surgeon: Burnell Blanks, MD;  Location: [place];  Service: Open Heart Surgery;  Laterality: N/A;  . TRIGGER FINGER RELEASE  10/29/06     Current Medications: Outpatient Medications Prior to Visit  Medication Sig Dispense Refill  . atorvastatin (LIPITOR) 10 MG tablet Take 0.5 tablets (5 mg total) by mouth daily. 30 tablet 2  . benzonatate (TESSALON) 100 MG capsule Take 1-2 capsules (100-200 mg total) by mouth 3 (three) times daily as needed. 60 capsule 0  . cetirizine (ZYRTEC) 10 MG tablet Take 10 mg by mouth daily.    . cholecalciferol (VITAMIN D) 25 MCG (1000 UT) tablet Take 1,000 Units by mouth daily.    . clopidogrel (PLAVIX) 75 MG tablet Take 1 tablet (75 mg total) by mouth daily.    Marland Kitchen ezetimibe (ZETIA) 10 MG tablet Take 10 mg by mouth daily.     Marland Kitchen levothyroxine (SYNTHROID) 50 MCG tablet Take 50 mcg by mouth daily before breakfast.    . meclizine (ANTIVERT) 25 MG tablet Take 25 mg by mouth 3 (three) times daily as needed for dizziness.     . mometasone (NASONEX) 50 MCG/ACT nasal spray Place 2 sprays into the nose every evening.     . Multiple Vitamin (MULTIVITAMIN WITH MINERALS) TABS tablet Take 1 tablet by mouth 4 (four) times a week.     Vladimir Faster Glycol-Propyl Glycol (SYSTANE) 0.4-0.3 % SOLN Place 1 drop into both eyes 3 (three) times daily as needed (dry/irritated eyes.).    Marland Kitchen propranolol (INDERAL) 40 MG tablet Take 0.5 tablets (20 mg total) by mouth 3 (three) times daily. 270 tablet 3  . sodium chloride (OCEAN) 0.65 % SOLN nasal spray Place 1 spray into both nostrils daily.     No facility-administered medications prior to visit.     Allergies:   Aspirin, Ceclor [cefaclor], Statins, and Sulfa antibiotics   Social History   Socioeconomic History  . Marital status: Married    Spouse name: Not on file  . Number of children: 3  . Years of education: Not on file  . Highest education level: Not on file  Occupational History  . Not on file  Tobacco Use  . Smoking status: Never Smoker  . Smokeless tobacco: Never Used  Substance and Sexual Activity  . Alcohol use: No  . Drug use: No  . Sexual activity: Not on  file  Other Topics Concern  . Not on file  Social History Narrative  . Not on file   Social Determinants of Health   Financial Resource Strain:   . Difficulty of Paying Living Expenses: Not on file  Food Insecurity:   . Worried About Charity fundraiser in the Last Year: Not on file  . Ran Out of Food in the Last Year: Not on file  Transportation Needs:   . Lack of Transportation (Medical): Not on file  . Lack of Transportation (Non-Medical): Not on file  Physical Activity:   . Days  of Exercise per Week: Not on file  . Minutes of Exercise per Session: Not on file  Stress:   . Feeling of Stress : Not on file  Social Connections:   . Frequency of Communication with Friends and Family: Not on file  . Frequency of Social Gatherings with Friends and Family: Not on file  . Attends Religious Services: Not on file  . Active Member of Clubs or Organizations: Not on file  . Attends Archivist Meetings: Not on file  . Marital Status: Not on file     Family History:  The patient's family history includes Asthma in her maternal grandfather; Breast cancer in her daughter; Cancer in her maternal grandmother; Other in her father; Sarcoidosis in her sister; Tuberculosis in her mother.     ROS:   Please see the history of present illness.    ROS All other systems reviewed and are negative.   PHYSICAL EXAM:   VS:  BP 126/66   Pulse 78   Ht 5' (1.524 m)   Wt 121 lb 12.8 oz (55.2 kg)   SpO2 99%   BMI 23.79 kg/m    GEN: Well nourished, well developed, in no acute distress HEENT: normal Neck: no JVD or masses Cardiac: RRR; no murmurs, rubs, or gallops,no edema  Respiratory:  clear to auscultation bilaterally, normal work of breathing GI: soft, nontender, nondistended, + BS MS: no deformity or atrophy Skin: warm and dry, no rash. R groin with a 2x2cm sized knot. L groin with a 4x2cm sized knot. Neither were tender and no bruit heard on auscultation,  Neuro:  Alert and Oriented  x 3, Strength and sensation are intact Psych: euthymic mood, full affect   Wt Readings from Last 3 Encounters:  02/03/19 121 lb 12.8 oz (55.2 kg)  01/27/19 123 lb 6.4 oz (56 kg)  01/21/19 122 lb 1.6 oz (55.4 kg)      Studies/Labs Reviewed:   EKG:  EKG is ordered today.  The ekg ordered today demonstrates sinus 78 bpm  Recent Labs: 12/11/2018: TSH 94.200 01/21/2019: ALT 39; B Natriuretic Peptide 181.9 01/27/2019: BUN 14; Creatinine, Ser 1.08; Hemoglobin 8.0; Magnesium 1.6; Platelets 171; Potassium 3.7; Sodium 134   Lipid Panel    Component Value Date/Time   CHOL 194 06/25/2012 0625   TRIG 98 06/25/2012 0625   HDL 56 06/25/2012 0625   CHOLHDL 3.5 06/25/2012 0625   VLDL 20 06/25/2012 0625   LDLCALC 118 (H) 06/25/2012 0625    Additional studies/ records that were reviewed today include:  TAVR OPERATIVE NOTE  Date of Procedure:                01/26/2019  Preoperative Diagnosis:      Severe Aortic Stenosis   Postoperative Diagnosis:    Same   Procedure:        Transcatheter Aortic Valve Replacement - Percutaneous Right Transfemoral Approach             Medtronic Evolut Pro+ (size 23 mm,  serial # [id number])              Co-Surgeons:                        Gaye Pollack, MD and Lauree Chandler, MD   Anesthesiologist:                  Renold Don, MD  Echocardiographer:  Jenkins Rouge, MD  Pre-operative Echo Findings: ? Severe aortic stenosis ? Normal left ventricular systolic function  Post-operative Echo Findings: ? Trivial paravalvular leak ? Normal left ventricular systolic function  ____________   Echo 01/27/19 IMPRESSIONS  1. 23 mm Evolute Pro in aortic position. Vmax 2.2 m/s, MG 10.8 mmHG, EOA 1.50 cm2, DI 0.53. Trivial paravalvular leak in 6 o'clock position. Normal gradients.  2. Left ventricular ejection fraction, by visual estimation, is >75%. The left ventricle has hyperdynamic function. There is mildly increased left  ventricular hypertrophy.  3. Hyperdynamic LV with peak intracavitary gradient ~36 mmHG.  4. Indeterminate diastolic filling due to E-A fusion.  5. The left ventricle has no regional wall motion abnormalities.  6. Global right ventricle has normal systolic function.The right ventricular size is normal. No increase in right ventricular wall thickness.  7. Left atrial size was normal.  8. Right atrial size was normal.  9. Presence of pericardial fat pad. 10. Trivial pericardial effusion is present. 11. Mild to moderate mitral annular calcification. 12. The mitral valve is degenerative. No evidence of mitral valve regurgitation. Mild mitral stenosis. 13. Mild calcific mitral stenosis. Mean gradient 5.3 mmHG at 88 bpm. MVA 1.55 cm 2 by continuity. 14. The tricuspid valve is grossly normal. 15. Aortic valve regurgitation is not visualized. 16. The inferior vena cava is normal in size with <50% respiratory variability, suggesting right atrial pressure of 8 mmHg. 17. The pulmonic valve was not well visualized. Pulmonic valve regurgitation is not visualized. 18. No significant change from prior study. 19. TR signal is inadequate for assessing pulmonary artery systolic pressure. 20. A prior study was performed on 01/26/2019. 21. PVL appears trivial on this study.  ASSESSMENT & PLAN:   Severe AS s/p TAVR: continue on monotherapy with plavix due to an aspirin allergy. SBE prophylaxis discussed; I have RX'd amoxicillin (she takes this despite an incorrect PCN allergy listed in her chart- we have deleted this). She has some scar tissue buildup on both groin sites. No tenderness or bruit. Will continue to monitor. She will be seen back later this month by Dr Einar Gip for 1 month echo and follow up.   CAD: s/p orbital atherectomy of the proximal LAD and placement of a DES & orbital atherectomy and POBA to the diag on 01/05/19. Continue on monotherapy with plavix given aspirin allergy.   Pulmonary nodule: pre  TAVR CT scan showed a 8 x 6 mm nodule in the lateral segment of the right middle lobe, unchanged compared to the recent prior study from September 12, 2018. Non-contrast chest CT at 12 months is recommended. If the nodule is stable at time of repeat CT, then future CT at 18-24 months (from today's scan) is considered optional for low-risk patients, but is recommended for high-risk patients. I will go ahead and get this set up today to make sure it doesn't get lost to follow up.    Medication Adjustments/Labs and Tests Ordered: Current medicines are reviewed at length with the patient today.  Concerns regarding medicines are outlined above.  Medication changes, Labs and Tests ordered today are listed in the Patient Instructions below. Patient Instructions  Medication Instructions:  Your provider recommends that you continue on your current medications as directed. Please refer to the Current Medication list given to you today.   *If you need a refill on your cardiac medications before your next appointment, please call your pharmacy*  Testing/Procedures: Joellen Jersey recommends you have a chest CT in December, 2021.  Follow-Up:  Please call us if you need anything!    Signed, Angelena Form, PA-C  02/03/2019 3:02 PM    [facility] [place], [place], [place]  [address] Phone: [phone]; Fax: [phone]

## 2019-02-03 ENCOUNTER — Other Ambulatory Visit: Payer: Self-pay

## 2019-02-03 ENCOUNTER — Encounter: Payer: Self-pay | Admitting: Physician Assistant

## 2019-02-03 ENCOUNTER — Ambulatory Visit: Payer: Federal, State, Local not specified - PPO | Admitting: Physician Assistant

## 2019-02-03 VITALS — BP 126/66 | HR 78 | Ht 60.0 in | Wt 121.8 lb

## 2019-02-03 DIAGNOSIS — R911 Solitary pulmonary nodule: Secondary | ICD-10-CM

## 2019-02-03 DIAGNOSIS — I2510 Atherosclerotic heart disease of native coronary artery without angina pectoris: Secondary | ICD-10-CM | POA: Diagnosis not present

## 2019-02-03 DIAGNOSIS — Z952 Presence of prosthetic heart valve: Secondary | ICD-10-CM

## 2019-02-03 MED ORDER — AMOXICILLIN 500 MG PO TABS: ORAL_TABLET | ORAL | 8 refills | Status: DC

## 2019-02-03 NOTE — Patient Instructions (Signed)
Medication Instructions:  Your provider recommends that you continue on your current medications as directed. Please refer to the Current Medication list given to you today.   *If you need a refill on your cardiac medications before your next appointment, please call your pharmacy*  Testing/Procedures: Joellen Jersey recommends you have a chest CT in December, 2021.  Follow-Up: Please call us if you need anything!

## 2019-02-08 ENCOUNTER — Ambulatory Visit: Payer: Federal, State, Local not specified - PPO | Admitting: Cardiology

## 2019-02-17 ENCOUNTER — Other Ambulatory Visit: Payer: Federal, State, Local not specified - PPO

## 2019-02-18 ENCOUNTER — Ambulatory Visit (INDEPENDENT_AMBULATORY_CARE_PROVIDER_SITE_OTHER): Payer: Federal, State, Local not specified - PPO

## 2019-02-18 ENCOUNTER — Other Ambulatory Visit: Payer: Self-pay

## 2019-02-18 DIAGNOSIS — I350 Nonrheumatic aortic (valve) stenosis: Secondary | ICD-10-CM | POA: Diagnosis not present

## 2019-02-18 DIAGNOSIS — Z952 Presence of prosthetic heart valve: Secondary | ICD-10-CM

## 2019-02-19 ENCOUNTER — Ambulatory Visit: Payer: Federal, State, Local not specified - PPO

## 2019-02-19 ENCOUNTER — Ambulatory Visit: Payer: Federal, State, Local not specified - PPO | Admitting: Cardiology

## 2019-02-19 ENCOUNTER — Encounter: Payer: Self-pay | Admitting: Cardiology

## 2019-02-19 ENCOUNTER — Other Ambulatory Visit: Payer: Self-pay

## 2019-02-19 VITALS — BP 125/56 | HR 63 | Temp 96.1°F | Ht 60.0 in | Wt 121.6 lb

## 2019-02-19 DIAGNOSIS — Z952 Presence of prosthetic heart valve: Secondary | ICD-10-CM

## 2019-02-19 DIAGNOSIS — T8203XS Leakage of heart valve prosthesis, sequela: Secondary | ICD-10-CM

## 2019-02-19 DIAGNOSIS — I2510 Atherosclerotic heart disease of native coronary artery without angina pectoris: Secondary | ICD-10-CM

## 2019-02-19 DIAGNOSIS — D649 Anemia, unspecified: Secondary | ICD-10-CM

## 2019-02-19 NOTE — Progress Notes (Addendum)
Primary Physician/Referring:  Kelton Pillar, MD  Patient ID: [name] [name], female    DOB: 1931-10-18, 86 y.o.   MRN: [id number]  Chief Complaint  Patient presents with  . Coronary Artery Disease  . TAVR  . Follow-up    1 month  . PCI   HPI:    [name] [name]  is a 86 y.o. Caucasian female with history of benign positional vertigo, chronic dyspnea, chronic back pain, hypertension, mixed hyperlipidemia and  symptomatic aortic stenosis, CAD S/P PCI to LAD/D1  on 01/05/2019. Underwent elective TAVR on 01/26/2019.   She presents for follow-up, states that she has noticed marked improvement in dyspnea.  Only on extremes of exertion she feels dyspneic but thinks that she is back to her baseline.  She now complains of degenerative knee disease that is stopping her from doing things.  No chest pain, no PND or orthopnea.  Past Medical History:  Diagnosis Date  . Arthritis   . Asthma    history of  . CAD (coronary artery disease)   . Dyspnea    sometimes sitting.lying and on exertion  . GERD (gastroesophageal reflux disease)   . History of hiatal hernia   . History of transcatheter aortic valve replacement (TAVR) 01/26/2019   (TAVR  23 mm Evolute Pro in aortic position 01/26/2019) .   Marland Kitchen Hyperlipidemia   . Hypertension   . Hypothyroidism   . Lipoma    left arm  . Nasal polyps   . Pneumonia   . Pulmonary nodule    noted on pre TAVR CT  . Severe aortic stenosis    Past Surgical History:  Procedure Laterality Date  . CHOLECYSTECTOMY  02/27/04  . CORONARY ATHERECTOMY N/A 01/05/2019   Procedure: CORONARY ATHERECTOMY;  Surgeon: Adrian Prows, MD;  Location: [facility];  Service: Cardiovascular;  Laterality: N/A;  . CORONARY BALLOON ANGIOPLASTY N/A 01/05/2019   Procedure: CORONARY BALLOON ANGIOPLASTY;  Surgeon: Adrian Prows, MD;  Location: [facility] [facility];  Service: Cardiovascular;  Laterality: N/A;  . CORONARY STENT INTERVENTION N/A 01/05/2019   Procedure: CORONARY STENT  INTERVENTION;  Surgeon: Adrian Prows, MD;  Location: [facility];  Service: Cardiovascular;  Laterality: N/A;  . CORONARY STENT INTERVENTION  01/05/2019  . INCONTINENCE SURGERY  09/12/09  . NASAL SINUS SURGERY     several    . ORIF ANKLE FRACTURE Right 09/11/2013   Procedure: OPEN REDUCTION INTERNAL FIXATION (ORIF) ANKLE FRACTURE;  Surgeon: Augustin Schooling, MD;  Location: [facility];  Service: Orthopedics;  Laterality: Right;  . RIGHT/LEFT HEART CATH AND CORONARY ANGIOGRAPHY N/A 12/15/2018   Procedure: RIGHT/LEFT HEART CATH AND CORONARY ANGIOGRAPHY;  Surgeon: Adrian Prows, MD;  Location: [facility];  Service: Cardiovascular;  Laterality: N/A;  . ROTATOR CUFF REPAIR  09/27/2003  . TRANSCATHETER AORTIC VALVE REPLACEMENT, TRANSFEMORAL  01/26/2019  . TRANSCATHETER AORTIC VALVE REPLACEMENT, TRANSFEMORAL N/A 01/26/2019   Procedure: TRANSCATHETER AORTIC VALVE REPLACEMENT, TRANSFEMORAL;  Surgeon: Burnell Blanks, MD;  Location: [facility];  Service: Open Heart Surgery;  Laterality: N/A;  . TRIGGER FINGER RELEASE  10/29/06   Social History   Socioeconomic History  . Marital status: Married    Spouse name: Not on file  . Number of children: 3  . Years of education: Not on file  . Highest education level: Not on file  Occupational History  . Not on file  Tobacco Use  . Smoking status: Never Smoker  . Smokeless tobacco: Never Used  Substance  and Sexual Activity  . Alcohol use: No  . Drug use: No  . Sexual activity: Not on file  Other Topics Concern  . Not on file  Social History Narrative  . Not on file   Social Determinants of Health   Financial Resource Strain:   . Difficulty of Paying Living Expenses: Not on file  Food Insecurity:   . Worried About Charity fundraiser in the Last Year: Not on file  . Ran Out of Food in the Last Year: Not on file  Transportation Needs:   . Lack of Transportation (Medical): Not on file  . Lack of Transportation (Non-Medical): Not on file   Physical Activity:   . Days of Exercise per Week: Not on file  . Minutes of Exercise per Session: Not on file  Stress:   . Feeling of Stress : Not on file  Social Connections:   . Frequency of Communication with Friends and Family: Not on file  . Frequency of Social Gatherings with Friends and Family: Not on file  . Attends Religious Services: Not on file  . Active Member of Clubs or Organizations: Not on file  . Attends Archivist Meetings: Not on file  . Marital Status: Not on file  Intimate Partner Violence:   . Fear of Current or Ex-Partner: Not on file  . Emotionally Abused: Not on file  . Physically Abused: Not on file  . Sexually Abused: Not on file   ROS  Review of Systems  Constitution: Negative for weight gain.  Cardiovascular: Positive for dyspnea on exertion (chronic and imporved). Negative for leg swelling and syncope.  Respiratory: Positive for wheezing (occasional and chronic). Negative for hemoptysis.   Endocrine: Negative for cold intolerance.  Hematologic/Lymphatic: Does not bruise/bleed easily.  Musculoskeletal: Positive for arthritis, back pain and joint pain.  Gastrointestinal: Negative for hematochezia and melena.  Neurological: Negative for headaches and light-headedness.   Objective  Blood pressure (!) 125/56, pulse 63, temperature (!) 96.1 F (35.6 C), height 5' (1.524 m), weight 121 lb 9.6 oz (55.2 kg), SpO2 98 %. Body mass index is 23.75 kg/m.   Physical Exam  Constitutional: She appears well-developed and well-nourished. No distress.  HENT:  Head: Atraumatic.  Eyes: Conjunctivae are normal.  Neck: No thyromegaly present.  Cardiovascular: Normal rate, regular rhythm, S1 normal, S2 normal and intact distal pulses. Exam reveals no gallop.  Murmur heard. High-pitched blowing decrescendo early diastolic murmur is present with a grade of 3/6 at the upper right sternal border radiating to the apex. Pulses:      Carotid pulses are on the  right side with bruit and on the left side with bruit.      Popliteal pulses are 2+ on the right side and 2+ on the left side.       Dorsalis pedis pulses are 2+ on the right side and 2+ on the left side.       Posterior tibial pulses are 2+ on the right side and 2+ on the left side.  No leg edema.  No JVD.    Pulmonary/Chest: Effort normal and breath sounds normal.  Abdominal: Soft. Bowel sounds are normal.  Musculoskeletal:        General: Normal range of motion.     Cervical back: Neck supple.  Neurological: She is alert.  Skin: Skin is warm and dry.  Psychiatric: She has a normal mood and affect.   Radiology: PCV ECHOCARDIOGRAM COMPLETE  Result Date: 02/19/2019 Echocardiogram 02/18/2019:  Normal LV systolic function with EF 55%. Left ventricle cavity is normal in size. Mild concentric hypertrophy of the left ventricle. Normal global wall motion. Doppler evidence of grade II (pseudonormal) diastolic dysfunction, elevated LAP. Diastolic function evaluation may be an error due to mitral annular calcification.  Calculated EF 55%. Bioprosthetic aortic valve (TAVR  23 mm Evolute Pro in aortic position 01/26/2019) . No evidence of aortic stenosis. Moderate (Grade III) perivalvular aortic regurgitation. Mild calcification of the aortic valve annulus. Normal aortic valve leaflet mobility. Moderate posterior mitral valve leaflet calcification. Mild (Grade I) mitral regurgitation. Normal mitral valve leaflet mobility. Structurally normal tricuspid valve.  Mild to moderate tricuspid regurgitation. No evidence of pulmonary hypertension. Compared to 10/01/2018, TAVR is new.   Laboratory examination:   Recent Labs    01/06/19 0415 01/06/19 0415 01/21/19 1020 01/26/19 1138 01/26/19 1235 01/26/19 1345 01/27/19 0333  NA 137   < > 133*   < > 135 137 134*  K 3.6   < > 4.3   < > 4.0 3.9 3.7  CL 106   < > 100   < > 103 103 105  CO2 22  --  22  --   --   --  20*  GLUCOSE 103*   < > 105*   < > 112* 105*  85  BUN 11   < > 13   < > _0 CREATININE 1.12*   < > 1.23*   < > 1.00 1.00 1.08*  CALCIUM 8.0*  --  9.2  --   --   --  7.9*  GFRNONAA 45*  --  40*  --   --   --  47*  GFRAA 52*  --  46*  --   --   --  54*   < > = values in this interval not displayed.   CMP Latest Ref Rng & Units 01/27/2019 01/26/2019 01/26/2019  Glucose 70 - 99 mg/dL 85 105(H) 112(H)  BUN 8 - 23 mg/dL _1 Creatinine 0.44 - 1.00 mg/dL 1.08(H) 1.00 1.00  Sodium 135 - 145 mmol/L 134(L) 137 135  Potassium 3.5 - 5.1 mmol/L 3.7 3.9 4.0  Chloride 98 - 111 mmol/L 105 103 103  CO2 22 - 32 mmol/L 20(L) - -  Calcium 8.9 - 10.3 mg/dL 7.9(L) - -  Total Protein 6.5 - 8.1 g/dL - - -  Total Bilirubin 0.3 - 1.2 mg/dL - - -  Alkaline Phos 38 - 126 U/L - - -  AST 15 - 41 U/L - - -  ALT 0 - 44 U/L - - -   CBC Latest Ref Rng & Units 01/27/2019 01/26/2019 01/26/2019  WBC 4.0 - 10.5 K/uL 8.0 - -  Hemoglobin 12.0 - 15.0 g/dL 8.0(L) 9.5(L) 8.5(L)  Hematocrit 36.0 - 46.0 % 24.9(L) 28.0(L) 25.0(L)  Platelets 150 - 400 K/uL 171 - -   Lipid Panel   07/25/2017: Hemoglobin A1c 9%. Creatinine 1.16, EGFR 54, potassium 4.5, alkaline loss 420, AST 55, CMP otherwise normal. ( PCP has held Zetia and simvastatin for 2 months due to elevated LFTs). Hemoglobin 11.8.   Cholesterol 157, triglycerides 105, HDL 60, LDL 78.  05/27/2017: Cholesterol 208, triglycerides 84, HDL 81, LDL 110.     Component Value Date/Time   CHOL 194 06/25/2012 0625   TRIG 98 06/25/2012 0625   HDL 56 06/25/2012 0625   CHOLHDL 3.5 06/25/2012 0625   VLDL 20 06/25/2012 0625   LDLCALC 118 (H)  06/25/2012 0625   HEMOGLOBIN A1C Lab Results  Component Value Date   HGBA1C 4.9 01/21/2019   MPG 93.93 01/21/2019   TSH Recent Labs    09/12/18 1640 12/11/18 1027  TSH 0.015* 94.200*   Medications   Prior to Admission medications   Medication Sig Start Date End Date Taking? Authorizing Provider  alendronate (FOSAMAX) 70 MG tablet  12/23/17  Yes [provider]   Calcium Carb-Cholecalciferol (CALCIUM 1000 + D PO) Take 1 tablet by mouth daily.   Yes [provider]  cetirizine (ZYRTEC) 10 MG tablet Take 10 mg by mouth daily.   Yes [provider]  clopidogrel (PLAVIX) 75 MG tablet Take 1 tablet (75 mg total) by mouth every other day. 06/08/18  Yes Adrian Prows, MD  ezetimibe (ZETIA) 10 MG tablet Take 10 mg by mouth at bedtime.    Yes [provider]  meclizine (ANTIVERT) 25 MG tablet Take 25 mg by mouth 3 (three) times daily as needed for dizziness.   Yes [provider]  metoprolol succinate (TOPROL-XL) 50 MG 24 hr tablet Take 50 mg by mouth daily. 06/11/18  Yes [provider]  mometasone (NASONEX) 50 MCG/ACT nasal spray Place 2 sprays into the nose daily.   Yes [provider]  Multiple Vitamin (MULTIVITAMIN WITH MINERALS) TABS tablet Take 1 tablet by mouth daily.   Yes [provider]  nitrofurantoin, macrocrystal-monohydrate, (MACROBID) 100 MG capsule Take 1 capsule (100 mg total) by mouth 2 (two) times daily. X 7 days 09/12/18  Yes Pfeiffer, Jeannie Done, MD  benzonatate (TESSALON) 100 MG capsule Take 1-2 capsules (100-200 mg total) by mouth 3 (three) times daily as needed. Patient not taking: Reported on 09/14/2018 08/23/18   Jaynee Eagles, PA-C     Current Outpatient Medications  Medication Instructions  . atorvastatin (LIPITOR) 5 mg, Oral, Daily  . benzonatate (TESSALON) 100-200 mg, Oral, 3 times daily PRN  . cetirizine (ZYRTEC) 10 mg, Daily  . cholecalciferol (VITAMIN D) 1,000 Units, Oral, Daily  . clopidogrel (PLAVIX) 75 mg, Oral, Daily  . ezetimibe (ZETIA) 10 mg, Oral, Daily  . levothyroxine (SYNTHROID) 50 mcg, Oral, Daily before breakfast  . meclizine (ANTIVERT) 25 mg, Oral, 3 times daily PRN  . mometasone (NASONEX) 50 MCG/ACT nasal spray 2 sprays, Nasal, Every evening  . Multiple Vitamin (MULTIVITAMIN WITH MINERALS) TABS tablet 1 tablet, Oral, 4 times weekly  . Polyethyl Glycol-Propyl Glycol  (SYSTANE) 0.4-0.3 % SOLN 1 drop, Both Eyes, 3 times daily PRN  . propranolol (INDERAL) 20 mg, Oral, 3 times daily    Cardiac Studies:    Exercise myoview stress 04/25/2017: 1. The patient performed treadmill exercise using a Bruce protocol, completing 5 minutes. The patient completed an estimated workload of 4.88 METS, reaching 103% of the maximum predicted heart rate. Stress symptoms included dyspnea. Exercise capacity fair for age. Normal hemodynamic response. Possible old anteroseptal infarct. Poor R wave progression. No ischemic changes on stress electrocardiogram. 2. The overall quality of the study is excellent. There is no evidence of abnormal lung activity. Stress and rest SPECT images demonstrate homogeneous tracer distribution throughout the myocardium. Gated SPECT imaging reveals normal myocardial thickening and wall motion. The left ventricular ejection fraction was normal (77%). 3. Low risk study.  Carotid artery duplex 11/13/2017: Minimal stenosis in the right internal carotid artery (1-15%). Stenosis in the left internal carotid artery (16-49%). Antegrade right vertebral artery flow. Antegrade left vertebral artery flow. Follow up in one year is appropriate if clinically indicated. Compared to  05/14/2017, Left carotid stenosis severity decreased from >50%.  Echocardiogram 10/01/2018: Left ventricle cavity is normal in size. Mild concentric hypertrophy of the left ventricle. Normal LV systolic function with EF 59%. Normal global wall motion. Doppler evidence of grade II (pseudonormal) diastolic dysfunction, elevated LAP.  Trileaflet aortic valve. Moderate calcification of the aortic valve annulus and leaflets. At least moderate aortic stenosis. Moderate (Grade II) aortic regurgitation. Aortic valve mean gradient of 32 mmHg, Vmax of 3.6  m/s. Calculated aortic valve area by continuity equation is 0.6 cm. Dimensionless index is 0.21. Findings suggest paradoxically low flow low  gradient severe aortic stenosis. Consider clinical correlation and further workup. Mild calcification of mitral leaflets. Mild restriction without significant stenosis.  Moderate (Grade II) mitral regurgitation. Mild to moderate tricuspid regurgitation. Estimated pulmonary artery systolic pressure is 35 mmHg.  Compared to the study done on 05/14/2017, peak gradient was 38 and mean gradient was 20 mmHg and aortic valve area of 0.73 cm by continuity, aortic stenosis is increased in severity.   Right and left heart catheterization 12/15/2018: RA 5/5, mean 5; RV 31/3, EDP 6; PA 32/14, mean 21, PA saturation 67%; PW 11/10, mean 10 mmHg.  CO 3.84, CI  2.5756. QP/QS 1.0.  Normal right heart catheterization. Mildly elevated LVEDP at 15 mmHg.  Peak aortic valve gradient of 27, mean 23.6 mmHg with calculated aortic valve area 0.76 cm.  Findings consistent with severe calcific degenerative aortic valve stenosis. Left dominant circulation, proximal LAD 80% stenosis, large D1 with ostial 90% stenosis, medium sized RI with a proximal 90% stenosis, dominant circumflex mild disease.  RCA mild disease and nondominant.  Recommendation: Patient will need to be evaluated for CABG and aortic valve replacement.  If the TAVR team feels she is a good candidate for TAVR, we could certainly consider either medical management of CAD or PCI to proximal LAD and TAVR. Patient be discharged home today with outpatient follow-up.  Patient has been referred to be evaluated by Dr. Gilford Raid. 30 mL contrast utilized.  Left Heart Catheterization 01/05/19:  Successful orbital atherectomy followed by stenting of the proximal LAD with a 3.0 x 18 mm resolute Onyx DES and proximal segment postdilated with a 3.5 x 8 mm Sapphire Fidelity.  Stenosis 90% to 0%.  TIMI-3 to TIMI-3 flow.  Orbital atherectomy followed by balloon angioplasty with 2.5 x 15 mm Sapphire balloon for D1, 90% to less than 15 to 20% stenosis.  TIMI-3 to TIMI-3 flow.   Echocardiogram 02/18/2019:  1. Normal LV systolic function with EF 55%. Left ventricle cavity is normal in size. Mild concentric hypertrophy of the left ventricle. Normal global wall motion. Doppler evidence of grade II (pseudonormal) diastolic dysfunction, elevated LAP. Diastolic function evaluation may be an error due to mitral annular calcification. Calculated EF 55%. 2. Bioprosthetic aortic valve (TAVR 23 mm Evolute Pro in aortic position 01/26/2019) . No evidence of aortic stenosis. Moderate (Grade III) perivalvular aortic regurgitation. Mild calcification of the aortic valve annulus. Normal aortic valve leaflet mobility. 3. Moderate posterior mitral valve leaflet calcification. Mild (Grade I) mitral regurgitation. Normal mitral valve leaflet  mobility. 4. Structurally normal tricuspid valve. Mild to moderate tricuspid regurgitation. No evidence of pulmonary hypertension. 5. Compared to 10/01/2018, TAVR is new.  Assessment     ICD-10-CM   1. Coronary artery disease involving native coronary artery of native heart without angina pectoris  I25.10   2. History of transcatheter aortic valve replacement (TAVR)   23 mm Evolute Pro in aortic position 01/26/2019) .  Z95.2   3. Paravalvular leak of prosthetic heart valve, sequela  T82.03XS Lactate Dehydrogenase (LDH)    Lactate Dehydrogenase (LDH)  4. Anemia, unspecified type  D64.9 CBC    Lactate Dehydrogenase (LDH)    Lactate Dehydrogenase (LDH)    CBC    EKG 01/13/2019: Normal sinus rhythm at rate of 66 bpm, normal axis.  Anteroseptal infarct old.  Frequent PACs.  Baseline artifact.  Nonspecific T abnormality.  EKG 12/23/2018: Normal sinus rhythm at rate of 72 beats minute, biatrial enlargement, left axis deviation, left anterior fascicular block.  Anteroseptal infarct old.  Nonspecific T abnormality.  IVCD, LVH. Compared to 11/23/2018, marked sinus bradycardia at the rate of 42 bpm not present.    Recommendations:   [name] [name]  is a  86 y.o. Caucasian female with history of benign positional vertigo, chronic dyspnea, chronic back pain, hypertension, mixed hyperlipidemia and  symptomatic aortic stenosis, CAD S/P PCI to LAD/D1  on 01/05/2019. Underwent elective TAVR on 01/26/2019.  She has aspirin anaphylaxis hence only on Plavix.  She is presently doing well and has class I-II dyspnea, but no clinical evidence of heart failure.  She does have moderate aortic regurgitation by echocardiogram and also by auscultation, but there is no clinical evidence of heart failure and she is asymptomatic essentially.  She has chronic anemia that was noted recently, it could have been related to slow GI bleed from critical aortic stenosis although no obvious bleeding was detected and iron studies were normal previously.  I will repeat CBC to look for hemolysis in view of perivalvular leak and also to follow-up on anemia as well.  I would like to see her back in 6 weeks for follow-up.  Adrian Prows, MD, [facility] 02/19/2019, 1:57 PM [facility]. [facility]  CC: Darlina Guys, MD  [facility] Added  [place]  [id number] 02/24/2019 01/13/2019  1 a. Ability to shower/bathe Not at all limited Slightly limited  1 b. Ability to walk 1 block Slightly limited Extremely limited  1 c. Ability to hurry/jog Moderately limited Other, Did not do  2. Edema feet/ankles/legs Never over the past 2 weeks Never over the past 2 weeks  3. Limited by fatigue 1-2 times a week All of the time  4. Limited by dyspnea 1-2 times a week All of the time  5. Sitting up / on 3+ pillows Never over the past 2 weeks Every night  6. Limited enjoyment of life Not limited at all Extremely limited  7. Rest of life w/ symptoms Mostly satisfied Not at all satisfied  8 a. Participation in hobbies N/A, did not do for other reasons Severely limited  8 b. Participation in chores Slightly limited Severely limited  8 c. Visiting family/friends N/A, did not do for other  reasons N/A, did not do for other reasons

## 2019-02-23 ENCOUNTER — Telehealth: Payer: Self-pay

## 2019-02-23 ENCOUNTER — Other Ambulatory Visit: Payer: Self-pay

## 2019-02-23 DIAGNOSIS — T8203XA Leakage of heart valve prosthesis, initial encounter: Secondary | ICD-10-CM

## 2019-02-23 DIAGNOSIS — I350 Nonrheumatic aortic (valve) stenosis: Secondary | ICD-10-CM

## 2019-02-23 NOTE — Telephone Encounter (Signed)
  [facility]   MULTIDISCIPLINARY HEART VALVE TEAM  1 month s/p TAVR Echo reviewed by Dr Angelena Form, Dr Cyndia Bent and Dr Johnsie Cancel.  Increased PVL noted on images and team recommends Cardiac CT (TAVR protocol) to evaluate the valve further. I contacted the pt and advised her of team review and recommendation and she is in agreement to have CT performed.  I will arrange and contact the pt with additional instructions.

## 2019-02-24 LAB — CBC
Hematocrit: 30 % — ABNORMAL LOW (ref 34.0–46.6)
Hemoglobin: 9.6 g/dL — ABNORMAL LOW (ref 11.1–15.9)
MCH: 29.1 pg (ref 26.6–33.0)
MCHC: 32 g/dL (ref 31.5–35.7)
MCV: 91 fL (ref 79–97)
Platelets: 287 10*3/uL (ref 150–450)
RBC: 3.3 x10E6/uL — ABNORMAL LOW (ref 3.77–5.28)
RDW: 12.6 % (ref 11.7–15.4)
WBC: 8.6 10*3/uL (ref 3.4–10.8)

## 2019-02-24 LAB — LACTATE DEHYDROGENASE: LDH: 174 IU/L (ref 119–226)

## 2019-02-25 ENCOUNTER — Ambulatory Visit: Payer: Federal, State, Local not specified - PPO | Attending: Internal Medicine

## 2019-02-25 DIAGNOSIS — Z23 Encounter for immunization: Secondary | ICD-10-CM | POA: Insufficient documentation

## 2019-02-25 LAB — BASIC METABOLIC PANEL
BUN/Creatinine Ratio: 9 — ABNORMAL LOW (ref 12–28)
BUN: 12 mg/dL (ref 8–27)
CO2: 23 mmol/L (ref 20–29)
Calcium: 9.3 mg/dL (ref 8.7–10.3)
Chloride: 96 mmol/L (ref 96–106)
Creatinine, Ser: 1.33 mg/dL — ABNORMAL HIGH (ref 0.57–1.00)
GFR calc Af Amer: 42 mL/min/{1.73_m2} — ABNORMAL LOW (ref >59)
GFR calc non Af Amer: 36 mL/min/{1.73_m2} — ABNORMAL LOW (ref >59)
Glucose: 177 mg/dL — ABNORMAL HIGH (ref 65–99)
Potassium: 4.6 mmol/L (ref 3.5–5.2)
Sodium: 135 mmol/L (ref 134–144)

## 2019-02-25 LAB — SPECIMEN STATUS REPORT

## 2019-02-25 NOTE — Progress Notes (Signed)
   Covid-19 Vaccination Clinic  Name:  [name] [name]    MRN: [id number] DOB: [birth year]  02/25/2019  [name] was observed post Covid-19 immunization for 30 minutes based on pre-vaccination screening without incidence. She was provided with Vaccine Information Sheet and instruction to access the V-Safe system.   [name] was instructed to call 911 with any severe reactions post vaccine: Marland Kitchen Difficulty breathing  . Swelling of your face and throat  . A fast heartbeat  . A bad rash all over your body  . Dizziness and weakness    Immunizations Administered    Name Date Dose VIS Date Route   Pfizer COVID-19 Vaccine 02/25/2019  2:06 PM 0.3 mL 01/01/2019 Intramuscular   Manufacturer: [place]   Lot: [id number]   [place]: [id number]

## 2019-03-02 ENCOUNTER — Encounter: Payer: Self-pay | Admitting: Physician Assistant

## 2019-03-04 ENCOUNTER — Other Ambulatory Visit (HOSPITAL_COMMUNITY): Payer: Self-pay | Admitting: *Deleted

## 2019-03-05 ENCOUNTER — Ambulatory Visit (HOSPITAL_COMMUNITY)
Admission: RE | Admit: 2019-03-05 | Discharge: 2019-03-05 | Disposition: A | Discharge status: Home / Self Care | Payer: Federal, State, Local not specified - PPO | Source: Ambulatory Visit | Attending: Cardiovascular Disease | Admitting: Cardiovascular Disease

## 2019-03-05 ENCOUNTER — Other Ambulatory Visit: Payer: Self-pay

## 2019-03-05 DIAGNOSIS — I350 Nonrheumatic aortic (valve) stenosis: Secondary | ICD-10-CM | POA: Insufficient documentation

## 2019-03-05 DIAGNOSIS — T8203XA Leakage of heart valve prosthesis, initial encounter: Secondary | ICD-10-CM | POA: Insufficient documentation

## 2019-03-05 LAB — BASIC METABOLIC PANEL
Anion gap: 10 (ref 5–15)
BUN: 17 mg/dL (ref 8–23)
CO2: 24 mmol/L (ref 22–32)
Calcium: 8.9 mg/dL (ref 8.9–10.3)
Chloride: 98 mmol/L (ref 98–111)
Creatinine, Ser: 1.36 mg/dL — ABNORMAL HIGH (ref 0.44–1.00)
GFR calc Af Amer: 41 mL/min — ABNORMAL LOW (ref >60)
GFR calc non Af Amer: 35 mL/min — ABNORMAL LOW (ref >60)
Glucose, Bld: 113 mg/dL — ABNORMAL HIGH (ref 70–99)
Potassium: 4.1 mmol/L (ref 3.5–5.1)
Sodium: 132 mmol/L — ABNORMAL LOW (ref 135–145)

## 2019-03-05 MED ORDER — SODIUM CHLORIDE 0.9 % WEIGHT BASED INFUSION: 1.0000 mL/kg/h | INTRAVENOUS | Status: DC

## 2019-03-05 MED ORDER — SODIUM CHLORIDE 0.9 % WEIGHT BASED INFUSION
3.0000 mL/kg/h | INTRAVENOUS | Status: AC
  Administered 2019-03-05: 09:00:00 3 mL/kg/h via INTRAVENOUS

## 2019-03-05 MED ORDER — IOHEXOL 350 MG/ML SOLN
100.0000 mL | Freq: Once | INTRAVENOUS | Status: AC | PRN
  Administered 2019-03-05: 100 mL via INTRAVENOUS

## 2019-03-08 ENCOUNTER — Telehealth (HOSPITAL_COMMUNITY): Payer: Self-pay

## 2019-03-08 NOTE — Telephone Encounter (Signed)
Pt insurance is active and benefits verified through [place] $30, DED 0/0 met, out of pocket $5,500/$155.07 met, co-insurance 0%. no pre-authorization required. Passport, 03/08/2019'@1' :35pm, REF# [id number]  Will contact patient to see if she is interested in the Cardiac Rehab Program. If interested, patient will need to complete follow up appt. Once completed, patient will be contacted for scheduling upon review by the RN Navigator.

## 2019-03-17 ENCOUNTER — Other Ambulatory Visit: Payer: Self-pay | Admitting: Cardiology

## 2019-03-17 ENCOUNTER — Ambulatory Visit: Payer: Federal, State, Local not specified - PPO | Admitting: Cardiology

## 2019-03-17 ENCOUNTER — Ambulatory Visit: Payer: Federal, State, Local not specified - PPO | Admitting: Cardiovascular Disease

## 2019-03-17 ENCOUNTER — Encounter: Payer: Self-pay | Admitting: Cardiovascular Disease

## 2019-03-17 ENCOUNTER — Other Ambulatory Visit: Payer: Self-pay

## 2019-03-17 VITALS — BP 118/70 | HR 71 | Ht 60.0 in | Wt 123.0 lb

## 2019-03-17 DIAGNOSIS — I350 Nonrheumatic aortic (valve) stenosis: Secondary | ICD-10-CM | POA: Diagnosis not present

## 2019-03-17 DIAGNOSIS — Z952 Presence of prosthetic heart valve: Secondary | ICD-10-CM

## 2019-03-17 DIAGNOSIS — T8203XA Leakage of heart valve prosthesis, initial encounter: Secondary | ICD-10-CM

## 2019-03-17 NOTE — Patient Instructions (Signed)
Medication Instructions:  No changes *If you need a refill on your cardiac medications before your next appointment, please call your pharmacy*  Lab Work: none If you have labs (blood work) drawn today and your tests are completely normal, you will receive your results only by: Marland Kitchen MyChart Message (if you have MyChart) OR . A paper copy in the mail If you have any lab test that is abnormal or we need to change your treatment, we will call you to review the results.  Testing/Procedures: None ordered  Follow-Up: With Dr. Einar Gip as planned.

## 2019-03-17 NOTE — Progress Notes (Signed)
[name]                                       Cardiology Office Note    Date:  03/17/2019   ID:  [name], DOB [birth year], MRN [id number]  PCP:  Kelton Pillar, MD  Cardiologist:  Dr. Einar Gip / Dr. Angelena Form & Dr. Cyndia Bent (TAVR)  CC: Follow up to discuss TAVR paravalvular leak  History of Present Illness:  [name] [name] is a 86 y.o. female with history of HTN, anemia, HLD, CAD and severe aortic stenosis s/p TAVR January 2021 who is here today for follow up in the valve clinic. She is followed by Dr. Einar Gip. Her TAVR procedure on 01/26/19 was successful with no immediate complications. A 23 mm [employer] Evolut Pro valve was placed. Echo on the first post-op day showed normally functioning valve prosthesis with trivial paravalvular leak. Her one month post TAVR echo on 02/18/19 in Dr. Irven Shelling office showed moderate paravalvular leak. Cardaic CT on 03/05/19 showed a well positioned Metronic Evolut Pro valve with normal leaflet coaptation. The mechanism for worsening paravalvular leak seen on echo appeared to be interposition of calcium from the compressed non coronary cusp behind the stent valve as well as a smaller area of calcium behind the left cusp.   She is here today to discuss the paravalvular leak. The patient denies any chest pain, palpitations, lower extremity edema, orthopnea, PND, dizziness, near syncope or syncope. She does have mild dyspnea with exertion. Overall doing very well.    Past Medical History:  Diagnosis Date  . Arthritis   . Asthma    history of  . CAD (coronary artery disease)   . Dyspnea    sometimes sitting.lying and on exertion  . GERD (gastroesophageal reflux disease)   . History of hiatal hernia   . History of transcatheter aortic valve replacement (TAVR) 01/26/2019   (TAVR  23 mm Evolute Pro in aortic position 01/26/2019) .   Marland Kitchen Hyperlipidemia   . Hypertension   . Hypothyroidism   . Lipoma    left arm   . Nasal polyps   . Pneumonia   . Pulmonary nodule    noted on pre TAVR CT  . Severe aortic stenosis     Past Surgical History:  Procedure Laterality Date  . CHOLECYSTECTOMY  02/27/04  . CORONARY ATHERECTOMY N/A 01/05/2019   Procedure: CORONARY ATHERECTOMY;  Surgeon: Adrian Prows, MD;  Location: [facility];  Service: Cardiovascular;  Laterality: N/A;  . CORONARY BALLOON ANGIOPLASTY N/A 01/05/2019   Procedure: CORONARY BALLOON ANGIOPLASTY;  Surgeon: Adrian Prows, MD;  Location: [facility];  Service: Cardiovascular;  Laterality: N/A;  . CORONARY STENT INTERVENTION N/A 01/05/2019   Procedure: CORONARY STENT INTERVENTION;  Surgeon: Adrian Prows, MD;  Location: [facility];  Service: Cardiovascular;  Laterality: N/A;  . CORONARY STENT INTERVENTION  01/05/2019  . INCONTINENCE SURGERY  09/12/09  . NASAL SINUS SURGERY     several    . ORIF ANKLE FRACTURE Right 09/11/2013   Procedure: OPEN REDUCTION INTERNAL FIXATION (ORIF) ANKLE FRACTURE;  Surgeon: Augustin Schooling, MD;  Location: [facility];  Service: Orthopedics;  Laterality: Right;  . RIGHT/LEFT HEART CATH AND CORONARY ANGIOGRAPHY N/A 12/15/2018   Procedure: RIGHT/LEFT HEART CATH AND CORONARY ANGIOGRAPHY;  Surgeon: Adrian Prows, MD;  Location: [facility];  Service: Cardiovascular;  Laterality: N/A;  . ROTATOR CUFF REPAIR  09/27/2003  . TRANSCATHETER AORTIC VALVE REPLACEMENT, TRANSFEMORAL  01/26/2019  . TRANSCATHETER AORTIC VALVE REPLACEMENT, TRANSFEMORAL N/A 01/26/2019   Procedure: TRANSCATHETER AORTIC VALVE REPLACEMENT, TRANSFEMORAL;  Surgeon: Burnell Blanks, MD;  Location: [facility];  Service: Open Heart Surgery;  Laterality: N/A;  . TRIGGER FINGER RELEASE  10/29/06    Current Medications: Outpatient Medications Prior to Visit  Medication Sig Dispense Refill  . cetirizine (ZYRTEC) 10 MG tablet Take 10 mg by mouth daily.    . cholecalciferol (VITAMIN D) 25 MCG (1000 UT) tablet Take 1,000 Units by mouth daily.    .  clopidogrel (PLAVIX) 75 MG tablet Take 1 tablet (75 mg total) by mouth daily.    Marland Kitchen ezetimibe (ZETIA) 10 MG tablet Take 10 mg by mouth daily.     Marland Kitchen levothyroxine (SYNTHROID) 50 MCG tablet Take 50 mcg by mouth daily before breakfast.    . meclizine (ANTIVERT) 25 MG tablet Take 25 mg by mouth 3 (three) times daily as needed for dizziness.     . Multiple Vitamin (MULTIVITAMIN WITH MINERALS) TABS tablet Take 1 tablet by mouth 4 (four) times a week.     Vladimir Faster Glycol-Propyl Glycol (SYSTANE) 0.4-0.3 % SOLN Place 1 drop into both eyes 3 (three) times daily as needed (dry/irritated eyes.).    Marland Kitchen propranolol (INDERAL) 40 MG tablet Take 0.5 tablets (20 mg total) by mouth 3 (three) times daily. 270 tablet 3  . atorvastatin (LIPITOR) 10 MG tablet Take 0.5 tablets (5 mg total) by mouth daily. 30 tablet 2   No facility-administered medications prior to visit.     Allergies:   Aspirin, Ceclor [cefaclor], Statins, and Sulfa antibiotics   Social History   Socioeconomic History  . Marital status: Married    Spouse name: Not on file  . Number of children: 3  . Years of education: Not on file  . Highest education level: Not on file  Occupational History  . Not on file  Tobacco Use  . Smoking status: Never Smoker  . Smokeless tobacco: Never Used  Substance and Sexual Activity  . Alcohol use: No  . Drug use: No  . Sexual activity: Not on file  Other Topics Concern  . Not on file  Social History Narrative  . Not on file   Social Determinants of Health   Financial Resource Strain:   . Difficulty of Paying Living Expenses: Not on file  Food Insecurity:   . Worried About Charity fundraiser in the Last Year: Not on file  . Ran Out of Food in the Last Year: Not on file  Transportation Needs:   . Lack of Transportation (Medical): Not on file  . Lack of Transportation (Non-Medical): Not on file  Physical Activity:   . Days of Exercise per Week: Not on file  . Minutes of Exercise per Session:  Not on file  Stress:   . Feeling of Stress : Not on file  Social Connections:   . Frequency of Communication with Friends and Family: Not on file  . Frequency of Social Gatherings with Friends and Family: Not on file  . Attends Religious Services: Not on file  . Active Member of Clubs or Organizations: Not on file  . Attends Archivist Meetings: Not on file  . Marital Status: Not on file     Family History:  The patient's family history includes Asthma in her maternal grandfather; Breast cancer in  her daughter; Cancer in her maternal grandmother; Heart attack in her brother; Heart disease in her brother; Other in her father; Sarcoidosis in her sister; Tuberculosis in her mother.     ROS:   Please see the history of present illness.    ROS All other systems reviewed and are negative.   PHYSICAL EXAM:   VS:  BP 118/70   Pulse 71   Ht 5' (1.524 m)   Wt 123 lb (55.8 kg)   SpO2 99%   BMI 24.02 kg/m     General: Well developed, well nourished, NAD  HEENT: OP clear, mucus membranes moist  SKIN: warm, dry. No rashes. Neuro: No focal deficits  Musculoskeletal: Muscle strength 5/5 all ext  Psychiatric: Mood and affect normal  Neck: No JVD, no carotid bruits, no thyromegaly, no lymphadenopathy.  Lungs:Clear bilaterally, no wheezes, rhonci, crackles Cardiovascular: Regular rate and rhythm. Diastolic murmur.  Abdomen:Soft. Bowel sounds present. Non-tender.  Extremities: No lower extremity edema. Pulses are 2 + in the bilateral DP/PT.   Wt Readings from Last 3 Encounters:  03/17/19 123 lb (55.8 kg)  03/05/19 121 lb (54.9 kg)  02/19/19 121 lb 9.6 oz (55.2 kg)      Studies/Labs Reviewed:   EKG:  EKG is not ordered today.    Recent Labs: 12/11/2018: TSH 94.200 01/21/2019: ALT 39; B Natriuretic Peptide 181.9 01/27/2019: Magnesium 1.6 02/23/2019: Hemoglobin 9.6; Platelets 287 03/05/2019: BUN 17; Creatinine, Ser 1.36; Potassium 4.1; Sodium 132   Cardiac CT 03/05/19: The  patient is s/p TAVR with a 23 mm Medtronic Evolut Pro valve. The valve appears well positioned. Native aortic root dimension 3.0 cm with no disruption. The leaflets appear normal and co-apt well There is no HALT/HAM. Measurements as below  SA cusp dimension  Non - 17.7 mm  Left- 19.6 mm  Right - 17 mm  Coronal Measurements  Inflow Diameter 20.7 mm Target 23 mm  Waist Diameter 18.8 mm Target 20 mm  Outflow Diameter 24 mm Target 34 mm  The mechanism for worsening PVL seen on echo appears to be interposition of calcium from the compressed non coronary cusp behind the stent valve. On SA images calcium measures 4.9 mm x 5.7 mm and corresponds to the pre implant calcium seen on the non coronary native aortic valve leaflet This calcium is seen best around 7:00 on the SA images. There is also a smaller area of calcium behind the left cusp at 4:00 on SA images slightly more superior that may correspond to the 2nd PVL jet seen on TTE  There appears to be normal LM and RCA flow with no obstruction from the stent valve  IMPRESSION: 1. Well positioned 23 mm Evolut Pro valve with normal appearing leaflets. Good coaptation with no HALT/HAM. Although target diameters for inflow, waist, outflow in systole are slightly low the mechanism of worsening PVL appears to be primarily a large area of calcification posterior to the stent valve from the native non coronary cusp. This area of calcium measures 4.9 x 5.7 mm and is best seen on SA images at 7:00.  Echo 02/18/19:([facility])  Normal LV systolic function with EF 55%. Left ventricle cavity is normal  in size. Mild concentric hypertrophy of the left ventricle. Normal global  wall motion. Doppler evidence of grade II (pseudonormal) diastolic  dysfunction, elevated LAP. Diastolic function evaluation may be an error  due to mitral annular calcification. Calculated EF 55%.  Bioprosthetic aortic valve (TAVR 23 mm Evolute  Pro in aortic position  01/26/2019) . No evidence of aortic stenosis. Moderate (Grade III)  perivalvular aortic regurgitation. Mild calcification of the aortic valve  annulus. Normal aortic valve leaflet mobility.  Moderate posterior mitral valve leaflet calcification. Mild (Grade I)  mitral regurgitation. Normal mitral valve leaflet mobility.  Structurally normal tricuspid valve. Mild to moderate tricuspid  regurgitation. No evidence of pulmonary hypertension.  Compared to 10/01/2018, TAVR is new.   ASSESSMENT & PLAN:   1. Severe AS s/p TAVR: She has done well following her TAVR procedure but now there is evidence of paravalvular leak by echo. Cardiac CT confirms calcium behind the stent valve is most likely causing this. She is having mild dyspnea but overall doing well. We have reviewed her findings on the CT scan and echo and have discussed this with the [employer] national physician proctor. The recommendation given her small aortic root, small valve and degree of calcification is to monitor for now unless she were to have progression of symptoms. I will ask Dr. Einar Gip to repeat an echo in 3 months.    Medication Adjustments/Labs and Tests Ordered: Current medicines are reviewed at length with the patient today.  Concerns regarding medicines are outlined above.  Medication changes, Labs and Tests ordered today are listed in the Patient Instructions below. There are no Patient Instructions on file for this visit.   Signed, Lauree Chandler, MD  03/17/2019 9:47 AM     [facility] [place], [place], [place]  [address] Phone: [phone]; Fax: [phone]

## 2019-03-18 ENCOUNTER — Telehealth (HOSPITAL_COMMUNITY): Payer: Self-pay | Admitting: *Deleted

## 2019-03-18 NOTE — Telephone Encounter (Signed)
-----   Message from Burnell Blanks, MD sent at 03/18/2019  4:11 PM EST ----- Regarding: RE: Madaline Brilliant to proceed with scheduling cardiac rehab Hi, She can proceed with rehab per the normal protocol. Thanks, chris ----- Message ----- From: Rowe Pavy, RN Sent: 03/18/2019   3:43 PM EST To: Burnell Blanks, MD Subject: Ok to proceed with scheduling cardiac rehab    Dr. Angelena Form,  I have been following along since December for her readiness to proceed with scheduling CR.  Pt seen by you on yesterday for perivalvular leak.  Plan to monitor and reassess in by echo in 3 months.   Given this history, may this pt participate in cardiac rehab?  If so, are there any restrictions or limitations for [place] to observe during group exercise?  Thanks so much for your advisement Maurice Small RN, BSN Cardiac and Pulmonary Rehab Nurse Navigator

## 2019-03-20 ENCOUNTER — Other Ambulatory Visit: Payer: Self-pay | Admitting: Cardiology

## 2019-03-22 ENCOUNTER — Other Ambulatory Visit: Payer: Self-pay

## 2019-03-22 ENCOUNTER — Ambulatory Visit: Payer: Federal, State, Local not specified - PPO

## 2019-03-22 DIAGNOSIS — R0989 Other specified symptoms and signs involving the circulatory and respiratory systems: Secondary | ICD-10-CM

## 2019-03-22 DIAGNOSIS — I6523 Occlusion and stenosis of bilateral carotid arteries: Secondary | ICD-10-CM

## 2019-03-22 DIAGNOSIS — I2510 Atherosclerotic heart disease of native coronary artery without angina pectoris: Secondary | ICD-10-CM

## 2019-03-22 MED ORDER — CLOPIDOGREL BISULFATE 75 MG PO TABS: 75.0000 mg | ORAL_TABLET | Freq: Every day | ORAL | 0 refills | Status: DC

## 2019-03-24 ENCOUNTER — Ambulatory Visit: Payer: Federal, State, Local not specified - PPO | Attending: Internal Medicine

## 2019-03-24 DIAGNOSIS — Z23 Encounter for immunization: Secondary | ICD-10-CM | POA: Insufficient documentation

## 2019-03-24 NOTE — Progress Notes (Signed)
   Covid-19 Vaccination Clinic  Name:  [name] [name]    MRN: [id number] DOB: Apr 16, 1931  03/24/2019  [name] was observed post Covid-19 immunization for 15 minutes without incident. She was provided with Vaccine Information Sheet and instruction to access the V-Safe system.   [name] was instructed to call 911 with any severe reactions post vaccine: Marland Kitchen Difficulty breathing  . Swelling of face and throat  . A fast heartbeat  . A bad rash all over body  . Dizziness and weakness   Immunizations Administered    Name Date Dose VIS Date Route   Pfizer COVID-19 Vaccine 03/24/2019  4:03 PM 0.3 mL 01/01/2019 Intramuscular   Manufacturer: [place]   Lot: [id number]   [place]: [id number]

## 2019-03-24 NOTE — Telephone Encounter (Signed)
Called pt to see if she is interested in the cardiac rehab program, pt stated that she doesn't feel well and dont think she will be able to do it but will call back if anything changes.  Closed referral.

## 2019-03-25 ENCOUNTER — Other Ambulatory Visit: Payer: Federal, State, Local not specified - PPO

## 2019-03-28 ENCOUNTER — Other Ambulatory Visit: Payer: Self-pay | Admitting: Cardiology

## 2019-03-28 DIAGNOSIS — I6523 Occlusion and stenosis of bilateral carotid arteries: Secondary | ICD-10-CM

## 2019-03-29 ENCOUNTER — Ambulatory Visit: Payer: Federal, State, Local not specified - PPO | Admitting: Cardiology

## 2019-03-31 ENCOUNTER — Encounter: Payer: Self-pay | Admitting: Cardiology

## 2019-03-31 ENCOUNTER — Other Ambulatory Visit: Payer: Self-pay

## 2019-03-31 ENCOUNTER — Ambulatory Visit: Payer: Federal, State, Local not specified - PPO | Admitting: Cardiology

## 2019-03-31 VITALS — BP 139/53 | HR 54 | Temp 97.2°F | Resp 18 | Ht 60.0 in | Wt 120.5 lb

## 2019-03-31 DIAGNOSIS — T8203XD Leakage of heart valve prosthesis, subsequent encounter: Secondary | ICD-10-CM

## 2019-03-31 DIAGNOSIS — I6523 Occlusion and stenosis of bilateral carotid arteries: Secondary | ICD-10-CM

## 2019-03-31 DIAGNOSIS — R0609 Other forms of dyspnea: Secondary | ICD-10-CM

## 2019-03-31 DIAGNOSIS — Z298 Encounter for other specified prophylactic measures: Secondary | ICD-10-CM

## 2019-03-31 DIAGNOSIS — R0600 Dyspnea, unspecified: Secondary | ICD-10-CM

## 2019-03-31 DIAGNOSIS — Z952 Presence of prosthetic heart valve: Secondary | ICD-10-CM

## 2019-03-31 NOTE — Progress Notes (Signed)
Primary Physician/Referring:  Kelton Pillar, MD  Patient ID: [name] [name], female    DOB: 1931/07/19, 86 y.o.   MRN: [id number]  Chief Complaint  Patient presents with  . Coronary Artery Disease    6 week follow up   HPI:    [name] [name]  is a 86 y.o. Caucasian female with history of benign positional vertigo, chronic dyspnea, chronic back pain, hypertension, mixed hyperlipidemia and  symptomatic aortic stenosis, CAD S/P PCI to LAD/D1  on 01/05/2019. Underwent elective TAVR on 01/26/2019.   She presents for follow-up, states that she had noticed marked improvement in dyspnea, but lately has had mild worsening dyspnea on exertion, overall much improved compared to pre TAVR..  Only on extremes of exertion she feels dyspneic. No chest pain, no PND or orthopnea.  Past Medical History:  Diagnosis Date  . Arthritis   . Asthma    history of  . CAD (coronary artery disease)   . Dyspnea    sometimes sitting.lying and on exertion  . GERD (gastroesophageal reflux disease)   . History of hiatal hernia   . History of transcatheter aortic valve replacement (TAVR) 01/26/2019   (TAVR  23 mm Evolute Pro in aortic position 01/26/2019) .   Marland Kitchen Hyperlipidemia   . Hypertension   . Hypothyroidism   . Lipoma    left arm  . Nasal polyps   . Pneumonia   . Pulmonary nodule    noted on pre TAVR CT  . Severe aortic stenosis    Past Surgical History:  Procedure Laterality Date  . CHOLECYSTECTOMY  02/27/04  . CORONARY ATHERECTOMY N/A 01/05/2019   Procedure: CORONARY ATHERECTOMY;  Surgeon: Adrian Prows, MD;  Location: [facility];  Service: Cardiovascular;  Laterality: N/A;  . CORONARY BALLOON ANGIOPLASTY N/A 01/05/2019   Procedure: CORONARY BALLOON ANGIOPLASTY;  Surgeon: Adrian Prows, MD;  Location: [facility];  Service: Cardiovascular;  Laterality: N/A;  . CORONARY STENT INTERVENTION N/A 01/05/2019   Procedure: CORONARY STENT INTERVENTION;  Surgeon: Adrian Prows, MD;  Location: [facility];  Service: Cardiovascular;  Laterality: N/A;  . CORONARY STENT INTERVENTION  01/05/2019  . INCONTINENCE SURGERY  09/12/09  . NASAL SINUS SURGERY     several    . ORIF ANKLE FRACTURE Right 09/11/2013   Procedure: OPEN REDUCTION INTERNAL FIXATION (ORIF) ANKLE FRACTURE;  Surgeon: Augustin Schooling, MD;  Location: [facility];  Service: Orthopedics;  Laterality: Right;  . RIGHT/LEFT HEART CATH AND CORONARY ANGIOGRAPHY N/A 12/15/2018   Procedure: RIGHT/LEFT HEART CATH AND CORONARY ANGIOGRAPHY;  Surgeon: Adrian Prows, MD;  Location: [facility];  Service: Cardiovascular;  Laterality: N/A;  . ROTATOR CUFF REPAIR  09/27/2003  . TRANSCATHETER AORTIC VALVE REPLACEMENT, TRANSFEMORAL  01/26/2019  . TRANSCATHETER AORTIC VALVE REPLACEMENT, TRANSFEMORAL N/A 01/26/2019   Procedure: TRANSCATHETER AORTIC VALVE REPLACEMENT, TRANSFEMORAL;  Surgeon: Burnell Blanks, MD;  Location: [facility];  Service: Open Heart Surgery;  Laterality: N/A;  . TRIGGER FINGER RELEASE  10/29/06   Social History   Tobacco Use  . Smoking status: Never Smoker  . Smokeless tobacco: Never Used  Substance Use Topics  . Alcohol use: No    ROS  Review of Systems  Constitution: Negative for weight gain.  Cardiovascular: Positive for dyspnea on exertion (chronic and imporved). Negative for leg swelling and syncope.  Respiratory: Positive for wheezing (occasional and chronic). Negative for hemoptysis.   Endocrine: Negative for cold intolerance.  Hematologic/Lymphatic: Does not bruise/bleed easily.  Musculoskeletal: Positive for arthritis, back pain and joint pain.  Gastrointestinal: Negative for hematochezia and melena.  Neurological: Negative for headaches and light-headedness.   Objective  Blood pressure (!) 139/53, pulse (!) 54, temperature (!) 97.2 F (36.2 C), temperature source Temporal, resp. rate 18, height 5' (1.524 m), weight 120 lb 8 oz (54.7 kg), SpO2 98 %. Body mass index is 23.53 kg/m.   Vitals with BMI 03/31/2019  03/17/2019 03/05/2019  Height '5\' 0"'  '5\' 0"'  -  Weight 120 lbs 8 oz 123 lbs -  BMI 89.37 34.28 -  Systolic 768 115 726  Diastolic 53 70 60  Pulse 54 71 77    Physical Exam  Constitutional: She appears well-developed and well-nourished. No distress.  Cardiovascular: Normal rate, regular rhythm, S1 normal, S2 normal and intact distal pulses. Exam reveals no gallop.  Murmur heard. High-pitched blowing decrescendo early diastolic murmur is present with a grade of 3/6 at the upper right sternal border radiating to the apex. Pulses:      Carotid pulses are on the right side with bruit and on the left side with bruit.      Popliteal pulses are 2+ on the right side and 2+ on the left side.       Dorsalis pedis pulses are 2+ on the right side and 2+ on the left side.       Posterior tibial pulses are 2+ on the right side and 2+ on the left side.  No leg edema.  No JVD.    Pulmonary/Chest: Effort normal and breath sounds normal.  Abdominal: Soft. Bowel sounds are normal.   Radiology: No results found. Laboratory examination:   Recent Labs    01/27/19 0333 02/23/19 1200 03/05/19 0841  NA 134* 135 132*  K 3.7 4.6 4.1  CL 105 96 98  CO2 20* 23 24  GLUCOSE 85 177* 113*  BUN '14 12 17  ' CREATININE 1.08* 1.33* 1.36*  CALCIUM 7.9* 9.3 8.9  GFRNONAA 47* 36* 35*  GFRAA 54* 42* 41*   CMP Latest Ref Rng & Units 03/05/2019 02/23/2019 01/27/2019  Glucose 70 - 99 mg/dL 113(H) 177(H) 85  BUN 8 - 23 mg/dL '17 12 14  ' Creatinine 0.44 - 1.00 mg/dL 1.36(H) 1.33(H) 1.08(H)  Sodium 135 - 145 mmol/L 132(L) 135 134(L)  Potassium 3.5 - 5.1 mmol/L 4.1 4.6 3.7  Chloride 98 - 111 mmol/L 98 96 105  CO2 22 - 32 mmol/L 24 23 20(L)  Calcium 8.9 - 10.3 mg/dL 8.9 9.3 7.9(L)  Total Protein 6.5 - 8.1 g/dL - - -  Total Bilirubin 0.3 - 1.2 mg/dL - - -  Alkaline Phos 38 - 126 U/L - - -  AST 15 - 41 U/L - - -  ALT 0 - 44 U/L - - -   CBC Latest Ref Rng & Units 02/23/2019 01/27/2019 01/26/2019  WBC 3.4 - 10.8 x10E3/uL 8.6 8.0 -    Hemoglobin 11.1 - 15.9 g/dL 9.6(L) 8.0(L) 9.5(L)  Hematocrit 34.0 - 46.6 % 30.0(L) 24.9(L) 28.0(L)  Platelets 150 - 450 x10E3/uL 287 171 -   Lipid Panel   07/25/2017: Hemoglobin A1c 9%. Creatinine 1.16, EGFR 54, potassium 4.5, alkaline loss 420, AST 55, CMP otherwise normal. ( PCP has held Zetia and simvastatin for 2 months due to elevated LFTs). Hemoglobin 11.8.   Cholesterol 157, triglycerides 105, HDL 60, LDL 78.  05/27/2017: Cholesterol 208, triglycerides 84, HDL 81, LDL 110.     Component Value Date/Time   CHOL 194 06/25/2012 0625  TRIG 98 06/25/2012 0625   HDL 56 06/25/2012 0625   CHOLHDL 3.5 06/25/2012 0625   VLDL 20 06/25/2012 0625   LDLCALC 118 (H) 06/25/2012 0625   HEMOGLOBIN A1C Lab Results  Component Value Date   HGBA1C 4.9 01/21/2019   MPG 93.93 01/21/2019   TSH Recent Labs    09/12/18 1640 12/11/18 1027  TSH 0.015* 94.200*   External labs:  Hemoglobin 10.400 03/22/2019; INR 1.100 01/21/2019 Platelets 287.000 02/23/2019  Creatinine, Serum 1.250 03/22/2019 Potassium 5.100 03/22/2019 ALT (SGPT) 47.000 03/22/2019  TSH 3.200 03/22/2019  Medications    Current Outpatient Medications  Medication Instructions  . atorvastatin (LIPITOR) 5 mg, Oral, Daily  . cetirizine (ZYRTEC) 10 mg, Daily  . cholecalciferol (VITAMIN D) 1,000 Units, Oral, Daily  . clopidogrel (PLAVIX) 75 mg, Oral, Daily  . ezetimibe (ZETIA) 10 mg, Oral, Daily  . levothyroxine (SYNTHROID) 50 mcg, Oral, Daily before breakfast  . meclizine (ANTIVERT) 25 mg, Oral, 3 times daily PRN  . Multiple Vitamin (MULTIVITAMIN WITH MINERALS) TABS tablet 1 tablet, Oral, 4 times weekly  . Polyethyl Glycol-Propyl Glycol (SYSTANE) 0.4-0.3 % SOLN 1 drop, Both Eyes, 3 times daily PRN  . propranolol (INDERAL) 20 mg, Oral, 3 times daily    Cardiac Studies:    Exercise myoview stress 04/25/2017: 1. The patient performed treadmill exercise using a Bruce protocol, completing 5 minutes. The patient completed an estimated  workload of 4.88 METS, reaching 103% of the maximum predicted heart rate. Stress symptoms included dyspnea. Exercise capacity fair for age. Normal hemodynamic response. Possible old anteroseptal infarct. Poor R wave progression. No ischemic changes on stress electrocardiogram. 2. The overall quality of the study is excellent. There is no evidence of abnormal lung activity. Stress and rest SPECT images demonstrate homogeneous tracer distribution throughout the myocardium. Gated SPECT imaging reveals normal myocardial thickening and wall motion. The left ventricular ejection fraction was normal (77%). 3. Low risk study.  Right and left heart catheterization 12/15/2018: RA 5/5, mean 5; RV 31/3, EDP 6; PA 32/14, mean 21, PA saturation 67%; PW 11/10, mean 10 mmHg.  CO 3.84, CI  2.5756. QP/QS 1.0.  Normal right heart catheterization. Mildly elevated LVEDP at 15 mmHg.  Peak aortic valve gradient of 27, mean 23.6 mmHg with calculated aortic valve area 0.76 cm.  Findings consistent with severe calcific degenerative aortic valve stenosis. Left dominant circulation, proximal LAD 80% stenosis, large D1 with ostial 90% stenosis, medium sized RI with a proximal 90% stenosis, dominant circumflex mild disease.  RCA mild disease and nondominant.  Recommendation: Patient will need to be evaluated for CABG and aortic valve replacement.  If the TAVR team feels she is a good candidate for TAVR, we could certainly consider either medical management of CAD or PCI to proximal LAD and TAVR. Patient be discharged home today with outpatient follow-up.  Patient has been referred to be evaluated by Dr. Gilford Raid. 30 mL contrast utilized.  Left Heart Catheterization 01/05/19:  Successful orbital atherectomy followed by stenting of the proximal LAD with a 3.0 x 18 mm resolute Onyx DES and proximal segment postdilated with a 3.5 x 8 mm Sapphire .  Stenosis 90% to 0%.  TIMI-3 to TIMI-3 flow.  Orbital atherectomy followed by  balloon angioplasty with 2.5 x 15 mm Sapphire balloon for D1, 90% to less than 15 to 20% stenosis.  TIMI-3 to TIMI-3 flow.  Echocardiogram 02/18/2019:  1. Normal LV systolic function with EF 55%. Left ventricle cavity is normal in size. Mild concentric hypertrophy of  the left ventricle. Normal global wall motion. Doppler evidence of grade II (pseudonormal) diastolic dysfunction, elevated LAP. Diastolic function evaluation may be an error due to mitral annular calcification. Calculated EF 55%. 2. Bioprosthetic aortic valve (TAVR 23 mm Evolute Pro in aortic position 01/26/2019) . No evidence of aortic stenosis. Moderate (Grade III) perivalvular aortic regurgitation. Mild calcification of the aortic valve annulus. Normal aortic valve leaflet mobility. 3. Moderate posterior mitral valve leaflet calcification. Mild (Grade I) mitral regurgitation. Normal mitral valve leaflet  mobility. 4. Structurally normal tricuspid valve. Mild to moderate tricuspid regurgitation. No evidence of pulmonary hypertension. 5. Compared to 10/01/2018, TAVR is new.  Carotid artery duplex 03/22/2019:  Duplex suggests stenosis in the right internal carotid artery (16-49%). Duplex suggests stenosis in the right external carotid artery (<50%).  Duplex suggests stenosis in the left internal carotid artery (50-69%).  Antegrade right vertebral artery flow. Antegrade left vertebral artery flow.  Follow up in six months is appropriate if clinically indicated. Compared  to the study done on 11/13/2017, there is mild progression of disease  bilaterally.  Assessment     ICD-10-CM   1. S/P TAVR (transcatheter aortic valve replacement)  Z95.2   2. Paravalvular leak of prosthetic heart valve, subsequent encounter  T82.03XD   3. Asymptomatic bilateral carotid artery stenosis  I65.23   4. Dyspnea on exertion  R06.00   5. SBE (subacute bacterial endocarditis) prophylaxis candidate  Z29.8     EKG 01/13/2019: Normal sinus rhythm at  rate of 66 bpm, normal axis.  Anteroseptal infarct old.  Frequent PACs.  Baseline artifact.  Nonspecific T abnormality.  EKG 12/23/2018: Normal sinus rhythm at rate of 72 beats minute, biatrial enlargement, left axis deviation, left anterior fascicular block.  Anteroseptal infarct old.  Nonspecific T abnormality.  IVCD, LVH. Compared to 11/23/2018, marked sinus bradycardia at the rate of 42 bpm not present.    Recommendations:   [name] [name]  is a 86 y.o. Caucasian female with history of benign positional vertigo, chronic dyspnea, chronic back pain, hypertension, mixed hyperlipidemia and  symptomatic aortic stenosis, CAD S/P PCI to LAD/D1  on 01/05/2019. Underwent elective TAVR on 01/26/2019.  She has aspirin anaphylaxis hence only on Plavix.  She is presently doing well and has class II dyspnea, but no clinical evidence of heart failure.  She does have moderate aortic regurgitation by echocardiogram and also by auscultation, but there is no clinical evidence of heart failure and although she now has noticed mild worsening dyspnea, it has remained stable, able to do activities of daily living without limitations but does get short of breath beyond that.  She has been scheduled for repeat echocardiogram in 3 months to follow-up on aortic regurgitation.  Patient is aware that she needs endocarditis prophylaxis.  Renal function has remained stable.  Platelet count and Hb has remained stable as well.   She has had CBC done by her PCP last week, and shows improvement in Hb.  She has been recommended iron supplements.  Would recommend serial monitoring of CBC.  Will forward the note to her PCP as well.  I will see her back in 6 months.  With regard to carotid artery disease, stenosis is remained stable and remains asymptomatic with regard to this.  Adrian Prows, MD, [facility] 03/31/2019, 10:15 AM [facility]. [place]  CC: Darlina Guys, MD. Kelton Pillar, MD

## 2019-04-12 ENCOUNTER — Ambulatory Visit: Payer: Federal, State, Local not specified - PPO

## 2019-04-12 ENCOUNTER — Other Ambulatory Visit: Payer: Self-pay

## 2019-04-12 DIAGNOSIS — T8203XA Leakage of heart valve prosthesis, initial encounter: Secondary | ICD-10-CM

## 2019-04-12 DIAGNOSIS — Z952 Presence of prosthetic heart valve: Secondary | ICD-10-CM

## 2019-05-12 ENCOUNTER — Other Ambulatory Visit: Payer: Self-pay | Admitting: Cardiology

## 2019-05-12 DIAGNOSIS — E7800 Pure hypercholesterolemia, unspecified: Secondary | ICD-10-CM

## 2019-06-07 ENCOUNTER — Other Ambulatory Visit: Payer: Self-pay | Admitting: Cardiology

## 2019-06-07 DIAGNOSIS — I2510 Atherosclerotic heart disease of native coronary artery without angina pectoris: Secondary | ICD-10-CM

## 2019-06-18 NOTE — Progress Notes (Signed)
Primary Physician/Referring:  Kelton Pillar, MD  Patient ID: [name] [name], female    DOB: 1931-08-04, 86 y.o.   MRN: [id number]  Chief Complaint  Patient presents with  . Follow-up  . Shortness of Breath   HPI:    [name] [name]  is a 86 y.o. Caucasian female with history of benign positional vertigo, chronic dyspnea, chronic back pain, hypertension, mixed hyperlipidemia and  symptomatic aortic stenosis, CAD S/P PCI to LAD/D1  on 01/05/2019. Underwent elective TAVR on 01/26/2019 but has developed paravalvular leak with recurrence of dyspnea.   This is a 84-monthoffice visit, states that her exercise capacity has reduced and she continues to have dyspnea with routine activities.  No PND orthopnea.  No leg edema.  No chest pain.  Otherwise tolerating medications well.  She has resumed all activities but has to rest fairly frequently.  Past Medical History:  Diagnosis Date  . Arthritis   . Asthma    history of  . CAD (coronary artery disease)   . Dyspnea    sometimes sitting.lying and on exertion  . GERD (gastroesophageal reflux disease)   . History of hiatal hernia   . History of transcatheter aortic valve replacement (TAVR) 01/26/2019   (TAVR  23 mm Evolute Pro in aortic position 01/26/2019) .   .Marland KitchenHyperlipidemia   . Hypertension   . Hypothyroidism   . Lipoma    left arm  . Nasal polyps   . Pneumonia   . Pulmonary nodule    noted on pre TAVR CT  . Severe aortic stenosis    Past Surgical History:  Procedure Laterality Date  . CHOLECYSTECTOMY  02/27/04  . CORONARY ATHERECTOMY N/A 01/05/2019   Procedure: CORONARY ATHERECTOMY;  Surgeon: GAdrian Prows MD;  [facility];  Service: Cardiovascular;  Laterality: N/A;  . CORONARY BALLOON ANGIOPLASTY N/A 01/05/2019   Procedure: CORONARY BALLOON ANGIOPLASTY;  Surgeon: GAdrian Prows MD;  [facility];  Service: Cardiovascular;  Laterality: N/A;  . CORONARY STENT INTERVENTION N/A 01/05/2019   Procedure:  CORONARY STENT INTERVENTION;  Surgeon: GAdrian Prows MD;  [facility];  Service: Cardiovascular;  Laterality: N/A;  . CORONARY STENT INTERVENTION  01/05/2019  . INCONTINENCE SURGERY  09/12/09  . NASAL SINUS SURGERY     several    . ORIF ANKLE FRACTURE Right 09/11/2013   Procedure: OPEN REDUCTION INTERNAL FIXATION (ORIF) ANKLE FRACTURE;  Surgeon: SAugustin Schooling MD;  Location: [facility];  Service: Orthopedics;  Laterality: Right;  . RIGHT/LEFT HEART CATH AND CORONARY ANGIOGRAPHY N/A 12/15/2018   Procedure: RIGHT/LEFT HEART CATH AND CORONARY ANGIOGRAPHY;  Surgeon: GAdrian Prows MD;  [facility];  Service: Cardiovascular;  Laterality: N/A;  . ROTATOR CUFF REPAIR  09/27/2003  . TRANSCATHETER AORTIC VALVE REPLACEMENT, TRANSFEMORAL  01/26/2019  . TRANSCATHETER AORTIC VALVE REPLACEMENT, TRANSFEMORAL N/A 01/26/2019   Procedure: TRANSCATHETER AORTIC VALVE REPLACEMENT, TRANSFEMORAL;  Surgeon: MBurnell Blanks MD;  [facility] [facility]  Service: Open Heart Surgery;  Laterality: N/A;  . TRIGGER FINGER RELEASE  10/29/06   Family History  Problem Relation Age of Onset  . Tuberculosis Mother   . Other Father        Bronchiectasis  . Cancer Maternal Grandmother        ovarian  . Sarcoidosis Sister   . Asthma Maternal Grandfather   . Heart disease Brother   . Heart attack Brother   . Breast cancer Daughter  Social History   Tobacco Use  . Smoking status: Never Smoker  . Smokeless tobacco: Never Used  Substance Use Topics  . Alcohol use: No    Marital Status: Married  ROS  Review of Systems  Cardiovascular: Positive for dyspnea on exertion (chronic and imporved). Negative for chest pain, leg swelling, palpitations and syncope.  Respiratory: Positive for wheezing (occasional and chronic).   Musculoskeletal: Positive for arthritis, back pain and joint pain.  Gastrointestinal: Negative for melena.   Objective  Blood pressure (!) 144/66, pulse 74, resp. rate 16,  height 5' (1.524 m), weight 118 lb (53.5 kg), SpO2 99 %. Body mass index is 23.05 kg/m.   Vitals with BMI 06/23/2019 03/31/2019 03/17/2019  Height _0  _1  _2   Weight 118 lbs 120 lbs 8 oz 123 lbs  BMI 23.05 65.99 35.70  Systolic 177 939 030  Diastolic 66 53 70  Pulse 74 54 71    Physical Exam  Constitutional: She appears well-developed and well-nourished. No distress.  Cardiovascular: Normal rate, regular rhythm, S1 normal, S2 normal and intact distal pulses. Exam reveals no gallop.  Murmur heard.  Early systolic murmur is present with a grade of 2/6 at the upper right sternal border. High-pitched blowing decrescendo early diastolic murmur is present with a grade of 2/6 at the upper right sternal border radiating to the apex. Pulses:      Carotid pulses are on the right side with bruit and on the left side with bruit.      Popliteal pulses are 2+ on the right side and 2+ on the left side.       Dorsalis pedis pulses are 2+ on the right side and 2+ on the left side.       Posterior tibial pulses are 2+ on the right side and 2+ on the left side.  No leg edema.  No JVD.    Pulmonary/Chest: Effort normal and breath sounds normal.  Abdominal: Soft. Bowel sounds are normal.   Laboratory examination:   Recent Labs    01/27/19 0333 02/23/19 1200 03/05/19 0841  NA 134* 135 132*  K 3.7 4.6 4.1  CL 105 96 98  CO2 20* 23 24  GLUCOSE 85 177* 113*  BUN _3 CREATININE 1.08* 1.33* 1.36*  CALCIUM 7.9* 9.3 8.9  GFRNONAA 47* 36* 35*  GFRAA 54* 42* 41*   CMP Latest Ref Rng & Units 03/05/2019 02/23/2019 01/27/2019  Glucose 70 - 99 mg/dL 113(H) 177(H) 85  BUN 8 - 23 mg/dL _4 Creatinine 0.44 - 1.00 mg/dL 1.36(H) 1.33(H) 1.08(H)  Sodium 135 - 145 mmol/L 132(L) 135 134(L)  Potassium 3.5 - 5.1 mmol/L 4.1 4.6 3.7  Chloride 98 - 111 mmol/L 98 96 105  CO2 22 - 32 mmol/L 24 23 20(L)  Calcium 8.9 - 10.3 mg/dL 8.9 9.3 7.9(L)  Total Protein 6.5 - 8.1 g/dL - - -  Total Bilirubin 0.3 -  1.2 mg/dL - - -  Alkaline Phos 38 - 126 U/L - - -  AST 15 - 41 U/L - - -  ALT 0 - 44 U/L - - -   CBC Latest Ref Rng & Units 02/23/2019 01/27/2019 01/26/2019  WBC 3.4 - 10.8 x10E3/uL 8.6 8.0 -  Hemoglobin 11.1 - 15.9 g/dL 9.6(L) 8.0(L) 9.5(L)  Hematocrit 34.0 - 46.6 % 30.0(L) 24.9(L) 28.0(L)  Platelets 150 - 450 x10E3/uL 287 171 -   Lipid Panel    Component Value Date/Time  CHOL 194 06/25/2012 0625   TRIG 98 06/25/2012 0625   HDL 56 06/25/2012 0625   CHOLHDL 3.5 06/25/2012 0625   VLDL 20 06/25/2012 0625   LDLCALC 118 (H) 06/25/2012 0625   HEMOGLOBIN A1C Lab Results  Component Value Date   HGBA1C 4.9 01/21/2019   MPG 93.93 01/21/2019   TSH Recent Labs    09/12/18 1640 12/11/18 1027  TSH 0.015* 94.200*    External Labs: Hemoglobin 10.400 03/22/2019; INR 1.100 01/21/2019 Platelets 287.000 02/23/2019  Creatinine, Serum 1.250 03/22/2019 Potassium 5.100 03/22/2019 ALT (SGPT) 47.000 03/22/2019  TSH 3.200 03/22/2019  07/25/2017: Hemoglobin A1c 9%. Creatinine 1.16, EGFR 54, potassium 4.5, alkaline loss 420, AST 55, CMP otherwise normal. ( PCP has held Zetia and simvastatin for 2 months due to elevated LFTs). Hemoglobin 11.8.   Cholesterol 157, triglycerides 105, HDL 60, LDL 78.  05/27/2017: Cholesterol 208, triglycerides 84, HDL 81, LDL 110.  Medications and allergies   Allergies  Allergen Reactions  . Aspirin Anaphylaxis  . Ceclor [Cefaclor] Anaphylaxis  . Statins Other (See Comments)    Elevated liver enzymes  . Sulfa Antibiotics Rash    Radiology:   No results found.  Cardiac Studies:    Exercise myoview stress 04/25/2017: 1. The patient performed treadmill exercise using a Bruce protocol, completing 5 minutes. The patient completed an estimated workload of 4.88 METS, reaching 103% of the maximum predicted heart rate. Stress symptoms included dyspnea. Exercise capacity fair for age. Normal hemodynamic response. Possible old anteroseptal infarct. Poor R wave progression. No  ischemic changes on stress electrocardiogram. 2. The overall quality of the study is excellent. There is no evidence of abnormal lung activity. Stress and rest SPECT images demonstrate homogeneous tracer distribution throughout the myocardium. Gated SPECT imaging reveals normal myocardial thickening and wall motion. The left ventricular ejection fraction was normal (77%). 3. Low risk study.  Right and left heart catheterization 12/15/2018: RA 5/5, mean 5; RV 31/3, EDP 6; PA 32/14, mean 21, PA saturation 67%; PW 11/10, mean 10 mmHg.  CO 3.84, CI  2.5756. QP/QS 1.0.  Normal right heart catheterization. Mildly elevated LVEDP at 15 mmHg.  Peak aortic valve gradient of 27, mean 23.6 mmHg with calculated aortic valve area 0.76 cm.  Findings consistent with severe calcific degenerative aortic valve stenosis. Left dominant circulation, proximal LAD 80% stenosis, large D1 with ostial 90% stenosis, medium sized RI with a proximal 90% stenosis, dominant circumflex mild disease.  RCA mild disease and nondominant.  Recommendation: Patient will need to be evaluated for CABG and aortic valve replacement.  If the TAVR team feels she is a good candidate for TAVR, we could certainly consider either medical management of CAD or PCI to proximal LAD and TAVR. Patient be discharged home today with outpatient follow-up.  Patient has been referred to be evaluated by Dr. Gilford Raid. 30 mL contrast utilized.  Left Heart Catheterization 01/05/2019:  Successful orbital atherectomy followed by stenting of the proximal LAD with a 3.0 x 18 mm resolute Onyx DES and proximal segment postdilated with a 3.5 x 8 mm Sapphire [place].  Stenosis 90% to 0%.  TIMI-3 to TIMI-3 flow.  Orbital atherectomy followed by balloon angioplasty with 2.5 x 15 mm Sapphire balloon for D1, 90% to less than 15 to 20% stenosis.  TIMI-3 to TIMI-3 flow.  Echocardiogram 02/18/2019:  1. Normal LV systolic function with EF 55%. Left ventricle cavity is normal in  size. Mild concentric hypertrophy of the left ventricle. Normal global wall motion. Doppler evidence of  grade II (pseudonormal) diastolic dysfunction, elevated LAP. Diastolic function evaluation may be an error due to mitral annular calcification. Calculated EF 55%. 2. Bioprosthetic aortic valve (TAVR 23 mm Evolute Pro in aortic position 01/26/2019) . No evidence of aortic stenosis. Moderate (Grade III) perivalvular aortic regurgitation. Mild calcification of the aortic valve annulus. Normal aortic valve leaflet mobility. 3. Moderate posterior mitral valve leaflet calcification. Mild (Grade I) mitral regurgitation. Normal mitral valve leaflet  mobility. 4. Structurally normal tricuspid valve. Mild to moderate tricuspid regurgitation. No evidence of pulmonary hypertension. 5. Compared to 10/01/2018, TAVR is new.  Carotid artery duplex 03/22/2019:  Duplex suggests stenosis in the right internal carotid artery (16-49%). Duplex suggests stenosis in the right external carotid artery (<50%).  Duplex suggests stenosis in the left internal carotid artery (50-69%).  Antegrade right vertebral artery flow. Antegrade left vertebral artery flow.  Follow up in six months is appropriate if clinically indicated. Compared  to the study done on 11/13/2017, there is mild progression of disease  Bilaterally.  Echocardiogram 04/12/2019:  Normal LV systolic function with visual EF 55-60%. No obvious regional wall motion abnormalities. Left ventricle cavity is normal in size.  Mild to moderate left ventricular hypertrophy. Normal global wall motion.  Elevated LAP. Unable to evaluate diastolic function accurately due to mitral annular calcification. Calculated EF 55%.  Left atrial cavity is mildly dilated.  Aortic bioprosthesis (TAVR 23 mm Evolute Pro 01/26/2019) well seated, without evidence of dehiscence. Moderate perivalvular regurgitation.  No evidence of aortic stenosis.  Mild posterior annular calcification.  Mild (Grade I) mitral regurgitation.  Mild to moderate tricuspid regurgitation. No evidence of pulmonary hypertension.  Mild pulmonic regurgitation.  IVC is normal with a respiratory response of <50%.  No significant change compared to prior study dated 02/18/2019.   EKG  EKG 06/23/2019: Normal sinus rhythm with rate of 70 bpm, leftward enlargement, incomplete left bundle branch block.  No further analysis.   No significant change from 01/13/2019.     Assessment     ICD-10-CM   1. Paravalvular leak of prosthetic heart valve, subsequent encounter  T82.03XD EKG 12-Lead  2. S/P TAVR (transcatheter aortic valve replacement)  Z95.2   3. Dyspnea on exertion  R06.00   4. SBE (subacute bacterial endocarditis) prophylaxis candidate  Z29.8      Outpatient Encounter Medications as of 06/23/2019  Medication Sig  . atorvastatin (LIPITOR) 10 MG tablet TAKE 1/2 TABLET BY MOUTH DAILY  . cetirizine (ZYRTEC) 10 MG tablet Take 10 mg by mouth daily.  . cholecalciferol (VITAMIN D) 25 MCG (1000 UT) tablet Take 1,000 Units by mouth daily.  . clopidogrel (PLAVIX) 75 MG tablet TAKE 1 TABLET BY MOUTH EVERY DAY  . ezetimibe (ZETIA) 10 MG tablet Take 10 mg by mouth daily.   Marland Kitchen FERREX 150 150 MG capsule Take 150 mg by mouth daily.  Marland Kitchen levothyroxine (SYNTHROID) 50 MCG tablet Take 50 mcg by mouth daily before breakfast.  . meclizine (ANTIVERT) 25 MG tablet Take 25 mg by mouth 3 (three) times daily as needed for dizziness.   . Multiple Vitamin (MULTIVITAMIN WITH MINERALS) TABS tablet Take 1 tablet by mouth 4 (four) times a week.   Vladimir Faster Glycol-Propyl Glycol (SYSTANE) 0.4-0.3 % SOLN Place 1 drop into both eyes 3 (three) times daily as needed (dry/irritated eyes.).  Marland Kitchen propranolol (INDERAL) 40 MG tablet Take 0.5 tablets (20 mg total) by mouth 3 (three) times daily.   No facility-administered encounter medications on file as of 06/23/2019.    Recommendations:  [name] [name]  is a 86 y.o. Caucasian female with  history of benign positional vertigo, chronic dyspnea, chronic back pain, hypertension, mixed hyperlipidemia and  symptomatic aortic stenosis, CAD S/P PCI to LAD/D1  on 01/05/2019. Underwent elective TAVR on 01/26/2019, has developed moderate paravalvular lead and recurrence of dyspnea. She now presents for 3 month F/U. She has aspirin anaphylaxis hence only on Plavix.  Patient symptoms of dyspnea has remained stable without acute decompensated heart failure on physical exam, but she has clearly noticed decreased exercise tolerance and fatigue that were present prior to TAVR.  We discussed regarding percutaneous repair of the perivalvular leak, she would like to wait and see how things proceed over the next 2 to 3 months.  I will repeat echocardiogram in 3 months and see her back at that time.  She is aware of endocarditis prophylaxis.  Labs are remained stable.  Hemoglobin is stable as well. Continue plavix.Adrian Prows, MD, [facility] 06/23/2019, 9:51 AM [facility] [place] Pager: [phone] Office: [phone]

## 2019-06-23 ENCOUNTER — Ambulatory Visit: Payer: Federal, State, Local not specified - PPO | Admitting: Cardiology

## 2019-06-23 ENCOUNTER — Other Ambulatory Visit: Payer: Self-pay

## 2019-06-23 ENCOUNTER — Encounter: Payer: Self-pay | Admitting: Cardiology

## 2019-06-23 VITALS — BP 144/66 | HR 74 | Resp 16 | Ht 60.0 in | Wt 118.0 lb

## 2019-06-23 DIAGNOSIS — Z298 Encounter for other specified prophylactic measures: Secondary | ICD-10-CM

## 2019-06-23 DIAGNOSIS — R0609 Other forms of dyspnea: Secondary | ICD-10-CM

## 2019-06-23 DIAGNOSIS — Z952 Presence of prosthetic heart valve: Secondary | ICD-10-CM

## 2019-06-23 DIAGNOSIS — T8203XD Leakage of heart valve prosthesis, subsequent encounter: Secondary | ICD-10-CM

## 2019-06-23 DIAGNOSIS — Z2989 Encounter for other specified prophylactic measures: Secondary | ICD-10-CM

## 2019-06-23 DIAGNOSIS — R0600 Dyspnea, unspecified: Secondary | ICD-10-CM

## 2019-08-30 ENCOUNTER — Other Ambulatory Visit: Payer: Self-pay | Admitting: Cardiology

## 2019-08-30 DIAGNOSIS — I2510 Atherosclerotic heart disease of native coronary artery without angina pectoris: Secondary | ICD-10-CM

## 2019-09-28 ENCOUNTER — Ambulatory Visit: Payer: Federal, State, Local not specified - PPO

## 2019-09-28 ENCOUNTER — Other Ambulatory Visit: Payer: Self-pay

## 2019-09-28 DIAGNOSIS — I6523 Occlusion and stenosis of bilateral carotid arteries: Secondary | ICD-10-CM

## 2019-09-28 DIAGNOSIS — R0609 Other forms of dyspnea: Secondary | ICD-10-CM

## 2019-09-28 DIAGNOSIS — T8203XD Leakage of heart valve prosthesis, subsequent encounter: Secondary | ICD-10-CM

## 2019-09-28 DIAGNOSIS — Z952 Presence of prosthetic heart valve: Secondary | ICD-10-CM

## 2019-09-28 DIAGNOSIS — R0600 Dyspnea, unspecified: Secondary | ICD-10-CM

## 2019-09-30 ENCOUNTER — Other Ambulatory Visit: Payer: Self-pay | Admitting: Cardiology

## 2019-09-30 DIAGNOSIS — I6523 Occlusion and stenosis of bilateral carotid arteries: Secondary | ICD-10-CM

## 2019-10-04 ENCOUNTER — Encounter: Payer: Self-pay | Admitting: Cardiology

## 2019-10-04 ENCOUNTER — Ambulatory Visit: Payer: Federal, State, Local not specified - PPO | Admitting: Cardiology

## 2019-10-04 ENCOUNTER — Other Ambulatory Visit: Payer: Self-pay

## 2019-10-04 VITALS — BP 126/61 | HR 59 | Resp 15 | Ht 60.0 in | Wt 117.0 lb

## 2019-10-04 DIAGNOSIS — T8203XD Leakage of heart valve prosthesis, subsequent encounter: Secondary | ICD-10-CM

## 2019-10-04 DIAGNOSIS — E7800 Pure hypercholesterolemia, unspecified: Secondary | ICD-10-CM

## 2019-10-04 DIAGNOSIS — K449 Diaphragmatic hernia without obstruction or gangrene: Secondary | ICD-10-CM

## 2019-10-04 DIAGNOSIS — R0600 Dyspnea, unspecified: Secondary | ICD-10-CM

## 2019-10-04 DIAGNOSIS — I6523 Occlusion and stenosis of bilateral carotid arteries: Secondary | ICD-10-CM

## 2019-10-04 DIAGNOSIS — R0609 Other forms of dyspnea: Secondary | ICD-10-CM

## 2019-10-04 DIAGNOSIS — Z952 Presence of prosthetic heart valve: Secondary | ICD-10-CM

## 2019-10-04 MED ORDER — ATORVASTATIN CALCIUM 10 MG PO TABS: 10.0000 mg | ORAL_TABLET | Freq: Every day | ORAL | 3 refills | Status: DC

## 2019-10-04 NOTE — Progress Notes (Signed)
Primary Physician/Referring:  Kelton Pillar, MD  Patient ID: [name] [name], female    DOB: 15-Apr-1931, 86 y.o.   MRN: [id number]  Chief Complaint  Patient presents with  . Follow-up    6 month  . Aortic valve disease  . Carotid Stenosis   HPI:    [name] [name]  is a 86 y.o. Caucasian female with history of benign positional vertigo, chronic dyspnea, chronic back pain, hypertension, mixed hyperlipidemia and  symptomatic aortic stenosis, CAD S/P PCI to LAD/D1  on 01/05/2019. Underwent elective TAVR on 01/26/2019 but has developed paravalvular leak with recurrence of dyspnea.   She presents for routine visit and states that her dyspnea has remained stable.  She has not had any PND or orthopnea or leg edema.  Denies chest pain or palpitations.  No symptoms to suggest TIA.  Past Medical History:  Diagnosis Date  . Arthritis   . Asthma    history of  . CAD (coronary artery disease)   . Dyspnea    sometimes sitting.lying and on exertion  . GERD (gastroesophageal reflux disease)   . History of hiatal hernia   . History of transcatheter aortic valve replacement (TAVR) 01/26/2019   (TAVR  23 mm Evolute Pro in aortic position 01/26/2019) .   Marland Kitchen Hyperlipidemia   . Hypertension   . Hypothyroidism   . Lipoma    left arm  . Nasal polyps   . Pneumonia   . Pulmonary nodule    noted on pre TAVR CT  . Severe aortic stenosis    Past Surgical History:  Procedure Laterality Date  . CHOLECYSTECTOMY  02/27/04  . CORONARY ATHERECTOMY N/A 01/05/2019   Procedure: CORONARY ATHERECTOMY;  Surgeon: Adrian Prows, MD;  Location: [facility];  Service: Cardiovascular;  Laterality: N/A;  . CORONARY BALLOON ANGIOPLASTY N/A 01/05/2019   Procedure: CORONARY BALLOON ANGIOPLASTY;  Surgeon: Adrian Prows, MD;  Location: [facility];  Service: Cardiovascular;  Laterality: N/A;  . CORONARY STENT INTERVENTION N/A 01/05/2019   Procedure: CORONARY STENT INTERVENTION;  Surgeon: Adrian Prows, MD;  Location: [facility];  Service: Cardiovascular;  Laterality: N/A;  . CORONARY STENT INTERVENTION  01/05/2019  . INCONTINENCE SURGERY  09/12/09  . NASAL SINUS SURGERY     several    . ORIF ANKLE FRACTURE Right 09/11/2013   Procedure: OPEN REDUCTION INTERNAL FIXATION (ORIF) ANKLE FRACTURE;  Surgeon: Augustin Schooling, MD;  Location: [facility];  Service: Orthopedics;  Laterality: Right;  . RIGHT/LEFT HEART CATH AND CORONARY ANGIOGRAPHY N/A 12/15/2018   Procedure: RIGHT/LEFT HEART CATH AND CORONARY ANGIOGRAPHY;  Surgeon: Adrian Prows, MD;  Location: [facility];  Service: Cardiovascular;  Laterality: N/A;  . ROTATOR CUFF REPAIR  09/27/2003  . TRANSCATHETER AORTIC VALVE REPLACEMENT, TRANSFEMORAL  01/26/2019  . TRANSCATHETER AORTIC VALVE REPLACEMENT, TRANSFEMORAL N/A 01/26/2019   Procedure: TRANSCATHETER AORTIC VALVE REPLACEMENT, TRANSFEMORAL;  Surgeon: Burnell Blanks, MD;  Location: [facility];  Service: Open Heart Surgery;  Laterality: N/A;  . TRIGGER FINGER RELEASE  10/29/06   Family History  Problem Relation Age of Onset  . Tuberculosis Mother   . Other Father        Bronchiectasis  . Cancer Maternal Grandmother        ovarian  . Sarcoidosis Sister   . Asthma Maternal Grandfather   . Heart disease Brother   . Heart attack Brother   . Breast cancer Daughter     Social History   Tobacco  Use  . Smoking status: Never Smoker  . Smokeless tobacco: Never Used  Substance Use Topics  . Alcohol use: No    Marital Status: Married  ROS  Review of Systems  Cardiovascular: Positive for dyspnea on exertion (chronic and imporved). Negative for chest pain, leg swelling, palpitations and syncope.  Respiratory: Positive for wheezing (occasional and chronic).   Musculoskeletal: Positive for arthritis, back pain and joint pain.  Gastrointestinal: Negative for melena.   Objective  Blood pressure 126/61, pulse (!) 59, resp. rate 15, height 5' (1.524 m), weight 117 lb (53.1 kg), SpO2 96 %. Body mass  index is 22.85 kg/m.   Vitals with BMI 10/04/2019 06/23/2019 03/31/2019  Height _0  _1  _2   Weight 117 lbs 118 lbs 120 lbs 8 oz  BMI 22.85 93.81 82.99  Systolic 371 696 789  Diastolic 61 66 53  Pulse 59 74 54    Physical Exam Constitutional:      General: She is not in acute distress.    Appearance: She is well-developed.  Cardiovascular:     Rate and Rhythm: Normal rate and regular rhythm.     Pulses: Intact distal pulses.          Carotid pulses are on the right side with bruit and on the left side with bruit.      Popliteal pulses are 2+ on the right side and 2+ on the left side.       Dorsalis pedis pulses are 2+ on the right side and 2+ on the left side.       Posterior tibial pulses are 2+ on the right side and 2+ on the left side.     Heart sounds: S1 normal and S2 normal. Murmur heard.  Harsh midsystolic murmur is present with a grade of 3/6 at the upper right sternal border radiating to the neck.  Diastolic murmur is present.  No gallop.      Comments: No leg edema.  No JVD.   Pulmonary:     Effort: Pulmonary effort is normal.     Breath sounds: Normal breath sounds.  Abdominal:     General: Bowel sounds are normal.     Palpations: Abdomen is soft.    Laboratory examination:   Recent Labs    01/27/19 0333 02/23/19 1200 03/05/19 0841  NA 134* 135 132*  K 3.7 4.6 4.1  CL 105 96 98  CO2 20* 23 24  GLUCOSE 85 177* 113*  BUN _3 CREATININE 1.08* 1.33* 1.36*  CALCIUM 7.9* 9.3 8.9  GFRNONAA 47* 36* 35*  GFRAA 54* 42* 41*   CMP Latest Ref Rng & Units 03/05/2019 02/23/2019 01/27/2019  Glucose 70 - 99 mg/dL 113(H) 177(H) 85  BUN 8 - 23 mg/dL _4 Creatinine 0.44 - 1.00 mg/dL 1.36(H) 1.33(H) 1.08(H)  Sodium 135 - 145 mmol/L 132(L) 135 134(L)  Potassium 3.5 - 5.1 mmol/L 4.1 4.6 3.7  Chloride 98 - 111 mmol/L 98 96 105  CO2 22 - 32 mmol/L 24 23 20(L)  Calcium 8.9 - 10.3 mg/dL 8.9 9.3 7.9(L)  Total Protein 6.5 - 8.1 g/dL - - -  Total Bilirubin 0.3 -  1.2 mg/dL - - -  Alkaline Phos 38 - 126 U/L - - -  AST 15 - 41 U/L - - -  ALT 0 - 44 U/L - - -   CBC Latest Ref Rng & Units 02/23/2019 01/27/2019 01/26/2019  WBC 3.4 - 10.8 x10E3/uL 8.6  8.0 -  Hemoglobin 11.1 - 15.9 g/dL 9.6(L) 8.0(L) 9.5(L)  Hematocrit 34.0 - 46.6 % 30.0(L) 24.9(L) 28.0(L)  Platelets 150 - 450 x10E3/uL 287 171 -   Lipid Panel    Component Value Date/Time   CHOL 194 06/25/2012 0625   TRIG 98 06/25/2012 0625   HDL 56 06/25/2012 0625   CHOLHDL 3.5 06/25/2012 0625   VLDL 20 06/25/2012 0625   LDLCALC 118 (H) 06/25/2012 0625   HEMOGLOBIN A1C Lab Results  Component Value Date   HGBA1C 4.9 01/21/2019   MPG 93.93 01/21/2019   TSH Recent Labs    12/11/18 1027  TSH 94.200*    External Labs:  Cholesterol, total 212.000 09/21/2019 HDL 65.000 09/21/2019 LDL-C 130.000 09/21/2019 Triglycerides 97.000 09/21/2019  A1C 4.900 01/21/2019 TSH 2.070 09/21/2019  Hemoglobin 12.300 09/21/2019  Creatinine, Serum 1.260 09/21/2019 Potassium 4.800 09/21/2019 Magnesium N/D ALT (SGPT) 59.000 09/21/2019  Hemoglobin 10.400 03/22/2019; INR 1.100 01/21/2019 Platelets 287.000 02/23/2019  Creatinine, Serum 1.250 03/22/2019 Potassium 5.100 03/22/2019 ALT (SGPT) 47.000 03/22/2019  TSH 3.200 03/22/2019  07/25/2017: Hemoglobin A1c 9%. Creatinine 1.16, EGFR 54, potassium 4.5, alkaline loss 420, AST 55, CMP otherwise normal. ( PCP has held Zetia and simvastatin for 2 months due to elevated LFTs). Hemoglobin 11.8.   Cholesterol 157, triglycerides 105, HDL 60, LDL 78.  05/27/2017: Cholesterol 208, triglycerides 84, HDL 81, LDL 110.  Medications and allergies   Allergies  Allergen Reactions  . Aspirin Anaphylaxis  . Ceclor [Cefaclor] Anaphylaxis  . Statins Other (See Comments)    Elevated liver enzymes  . Sulfa Antibiotics Rash   Current Outpatient Medications on File Prior to Visit  Medication Sig Dispense Refill  . amoxicillin (AMOXIL) 500 MG capsule Take 500 mg by mouth as needed. Take  medication only before dental procedures.    . cetirizine (ZYRTEC) 10 MG tablet Take 10 mg by mouth daily.    . cholecalciferol (VITAMIN D) 25 MCG (1000 UT) tablet Take 1,000 Units by mouth daily.    . clopidogrel (PLAVIX) 75 MG tablet TAKE 1 TABLET BY MOUTH EVERY DAY 90 tablet 0  . ezetimibe (ZETIA) 10 MG tablet Take 10 mg by mouth daily.     Marland Kitchen FERREX 150 150 MG capsule Take 150 mg by mouth as needed.     Marland Kitchen ibuprofen (ADVIL) 200 MG tablet Take 200 mg by mouth every 6 (six) hours as needed.    Marland Kitchen levothyroxine (SYNTHROID) 50 MCG tablet Take 50 mcg by mouth daily before breakfast.    . meclizine (ANTIVERT) 25 MG tablet Take 25 mg by mouth 3 (three) times daily as needed for dizziness.     . mometasone (NASONEX) 50 MCG/ACT nasal spray SMARTSIG:Both Nares    . Multiple Vitamin (MULTIVITAMIN WITH MINERALS) TABS tablet Take 1 tablet by mouth 4 (four) times a week.     Vladimir Faster Glycol-Propyl Glycol (SYSTANE) 0.4-0.3 % SOLN Place 1 drop into both eyes 3 (three) times daily as needed (dry/irritated eyes.).    Marland Kitchen propranolol (INDERAL) 40 MG tablet Take 0.5 tablets (20 mg total) by mouth 3 (three) times daily. 270 tablet 3   No current facility-administered medications on file prior to visit.    Radiology:   CT cardiac morphology 03/14/2019: 1. Well positioned 23 mm Evolut Pro valve with normal appearing leaflets. Good coaptation with no HALT/HAM. Although target diameters for inflow, waist, outflow in systole are slightly low the mechanism of worsening PVL appears to be primarily a large area of calcification posterior to the stent  valve from the native non coronary cusp. This area of calcium measures 4.9 x 5.7 mm and is best seen on SA images at 7:00.  Extracardiac:  1. Right middle lobe nodule is stable from 09/12/2018. Additional follow-up CT chest without contrast in 12 months is recommended to ensure stability and exclude malignancy. This recommendation follows the consensus statement:  Guidelines for Management of Small Pulmonary Nodules Detected on CT Images: From the Fleischner Society 2017; Radiology 2017; 284:228-243. 2.  Aortic atherosclerosis (ICD10-I70.0). 3. Large hiatal hernia.  Cardiac Studies:    Exercise myoview stress 04/25/2017: 1. The patient performed treadmill exercise using a Bruce protocol, completing 5 minutes. The patient completed an estimated workload of 4.88 METS, reaching 103% of the maximum predicted heart rate. Stress symptoms included dyspnea. Exercise capacity fair for age. Normal hemodynamic response. Possible old anteroseptal infarct. Poor R wave progression. No ischemic changes on stress electrocardiogram. 2. The overall quality of the study is excellent. There is no evidence of abnormal lung activity. Stress and rest SPECT images demonstrate homogeneous tracer distribution throughout the myocardium. Gated SPECT imaging reveals normal myocardial thickening and wall motion. The left ventricular ejection fraction was normal (77%). 3. Low risk study.  Right and left heart catheterization 12/15/2018: RA 5/5, mean 5; RV 31/3, EDP 6; PA 32/14, mean 21, PA saturation 67%; PW 11/10, mean 10 mmHg.  CO 3.84, CI  2.5756. QP/QS 1.0.  Normal right heart catheterization. Mildly elevated LVEDP at 15 mmHg.  Peak aortic valve gradient of 27, mean 23.6 mmHg with calculated aortic valve area 0.76 cm.  Findings consistent with severe calcific degenerative aortic valve stenosis. Left dominant circulation, proximal LAD 80% stenosis, large D1 with ostial 90% stenosis, medium sized RI with a proximal 90% stenosis, dominant circumflex mild disease.  RCA mild disease and nondominant.  Recommendation: Patient will need to be evaluated for CABG and aortic valve replacement.  If the TAVR team feels she is a good candidate for TAVR, we could certainly consider either medical management of CAD or PCI to proximal LAD and TAVR. Patient be discharged home today with outpatient  follow-up.  Patient has been referred to be evaluated by Dr. Gilford Raid. 30 mL contrast utilized.  Left Heart Catheterization 01/05/2019:  Successful orbital atherectomy followed by stenting of the proximal LAD with a 3.0 x 18 mm resolute Onyx DES and proximal segment postdilated with a 3.5 x 8 mm Sapphire Shiner.  Stenosis 90% to 0%.  TIMI-3 to TIMI-3 flow.  Orbital atherectomy followed by balloon angioplasty with 2.5 x 15 mm Sapphire balloon for D1, 90% to less than 15 to 20% stenosis.  TIMI-3 to TIMI-3 flow.  Echocardiogram 09/28/2019:  Left ventricle cavity is normal in size. Mild concentric hypertrophy of  the left ventricle. Normal global wall motion. Normal LV systolic function  with EF 65%. Doppler evidence of grade I (impaired) diastolic dysfunction,  normal LAP.  Well seated bioprosthetic aortic valve (TAVR 23 mm Evolute Pro  01/26/2019). Mean PG 8 mmHg, likely normal.  Mild to moderate paravalvular  regurgitation.  Trace mitral regurgitation.  Moderate tricuspid regurgitation. Estimated pulmonary artery systolic  pressure 26 mmHg.  No significant change compared to previous study on 04/12/2019.    Carotid artery duplex 09/28/2019:  Stenosis in the right internal carotid artery (16-49%).  Stenosis in the left internal carotid artery (50-69%).  Antegrade right vertebral artery flow. Antegrade left vertebral artery  flow.  Follow up in six months is appropriate if clinically indicated. NO  significant change  from 03/22/2019.   EKG  EKG 06/23/2019: Normal sinus rhythm with rate of 70 bpm, leftward enlargement, incomplete left bundle branch block.  No further analysis.   No significant change from 01/13/2019.     Assessment     ICD-10-CM   1. Paravalvular leak of prosthetic heart valve, subsequent encounter  T82.03XD PCV ECHOCARDIOGRAM COMPLETE  2. S/P TAVR (transcatheter aortic valve replacement)  Z95.2 PCV ECHOCARDIOGRAM COMPLETE  3. Dyspnea on exertion  R06.00 PCV ECHOCARDIOGRAM  COMPLETE  4. Asymptomatic bilateral carotid artery stenosis  I65.23   5. Hiatal hernia  K44.9   6. Hypercholesteremia  E78.00 atorvastatin (LIPITOR) 10 MG tablet    Lipid Panel With LDL/HDL Ratio    CMP14+EGFR     Meds ordered this encounter  Medications  . atorvastatin (LIPITOR) 10 MG tablet    Sig: Take 1 tablet (10 mg total) by mouth daily.    Dispense:  90 tablet    Refill:  3   Medications Discontinued During This Encounter  Medication Reason  . atorvastatin (LIPITOR) 10 MG tablet Reorder     Recommendations:   [name] [name]  is a 86 y.o. Caucasian female with history of benign positional vertigo, chronic dyspnea, chronic back pain, hypertension, mixed hyperlipidemia and  symptomatic aortic stenosis, CAD S/P PCI to LAD/D1  on 01/05/2019. Underwent elective TAVR on 01/26/2019, has developed moderate paravalvular lead and recurrence of dyspnea. She now presents for 3 month F/U. She has aspirin anaphylaxis hence only on Plavix.  She is aware of endocarditis prophylaxis.  Labs are remained stable.  Hemoglobin has improved. Continue plavix. Patient symptoms of dyspnea has remained stable without acute decompensated heart failure on physical exam.  Compared to previous examination 3 months ago, I do not hear aortic regurgitation murmur.  Also I suspect her dyspnea is probably related to combination of severe anemia that she had post procedure which now has improved and also related to a very large hiatal hernia.  Do not think that the aortic regurgitation is contributing majorly to her symptoms and there is also no evidence of hemolysis as hemoglobin is nicely increased with iron supplement.  Hence I do not think she needs perivalvular leak repair.  Presently her symptoms are class II.  With regard to carotid artery duplex, carotid stenosis has remained stable.  I have restarted her back on the atorvastatin but at 10 mg dose, she will continue with Zetia as well goal LDL <70.  Previously she  has had elevated LFTs with statins, I will repeat CMP along with lipids in 2 months.  Otherwise I will see her back in 6 months.  We will repeat echocardiogram at that time.   Adrian Prows, MD, [facility] 10/04/2019, 9:58 AM Office: [phone]   CC: Angelena Form, PA-C; Kelton Pillar, MD

## 2019-11-04 ENCOUNTER — Other Ambulatory Visit: Payer: Self-pay | Admitting: Cardiology

## 2019-11-04 DIAGNOSIS — E0590 Thyrotoxicosis, unspecified without thyrotoxic crisis or storm: Secondary | ICD-10-CM

## 2019-11-04 DIAGNOSIS — I10 Essential (primary) hypertension: Secondary | ICD-10-CM

## 2019-11-29 ENCOUNTER — Other Ambulatory Visit: Payer: Self-pay | Admitting: Cardiology

## 2019-11-29 DIAGNOSIS — I2510 Atherosclerotic heart disease of native coronary artery without angina pectoris: Secondary | ICD-10-CM

## 2019-12-01 LAB — CMP14+EGFR
ALT: 78 IU/L — ABNORMAL HIGH (ref 0–32)
AST: 106 IU/L — ABNORMAL HIGH (ref 0–40)
Albumin/Globulin Ratio: 1.4 (ref 1.2–2.2)
Albumin: 4.2 g/dL (ref 3.6–4.6)
Alkaline Phosphatase: 637 IU/L — ABNORMAL HIGH (ref 44–121)
BUN/Creatinine Ratio: 13 (ref 12–28)
BUN: 18 mg/dL (ref 8–27)
Bilirubin Total: 0.6 mg/dL (ref 0.0–1.2)
CO2: 24 mmol/L (ref 20–29)
Calcium: 9.7 mg/dL (ref 8.7–10.3)
Chloride: 101 mmol/L (ref 96–106)
Creatinine, Ser: 1.36 mg/dL — ABNORMAL HIGH (ref 0.57–1.00)
GFR calc Af Amer: 41 mL/min/{1.73_m2} — ABNORMAL LOW (ref >59)
GFR calc non Af Amer: 35 mL/min/{1.73_m2} — ABNORMAL LOW (ref >59)
Globulin, Total: 3.1 g/dL (ref 1.5–4.5)
Glucose: 104 mg/dL — ABNORMAL HIGH (ref 65–99)
Potassium: 4.5 mmol/L (ref 3.5–5.2)
Sodium: 138 mmol/L (ref 134–144)
Total Protein: 7.3 g/dL (ref 6.0–8.5)

## 2019-12-01 LAB — LIPID PANEL WITH LDL/HDL RATIO
Cholesterol, Total: 163 mg/dL (ref 100–199)
HDL: 62 mg/dL (ref >39)
LDL Chol Calc (NIH): 85 mg/dL (ref 0–99)
LDL/HDL Ratio: 1.4 ratio (ref 0.0–3.2)
Triglycerides: 83 mg/dL (ref 0–149)
VLDL Cholesterol Cal: 16 mg/dL (ref 5–40)

## 2019-12-01 NOTE — Progress Notes (Signed)
Please call PCP and inform them I faxed labs and liver function is markedly abnormal. Also call patient and let her know to f/u PCP

## 2019-12-02 ENCOUNTER — Telehealth: Payer: Self-pay | Admitting: Cardiology

## 2019-12-02 NOTE — Progress Notes (Signed)
Called and spoke with patient regarding her lab results. AD/S

## 2019-12-03 NOTE — Telephone Encounter (Signed)
Thanks Dr. Einar Gip!

## 2019-12-08 NOTE — Telephone Encounter (Signed)
Hi Dr Einar Gip, please note patient has been reached- I left a detailed vm asking for patient to call back to r/s echo and fu - I also added an appointment note so that front staff can see.   Thank you  -Leda Quail

## 2019-12-23 ENCOUNTER — Ambulatory Visit (HOSPITAL_COMMUNITY)
Admission: RE | Admit: 2019-12-23 | Discharge: 2019-12-23 | Disposition: A | Discharge status: Home / Self Care | Payer: Federal, State, Local not specified - PPO | Source: Ambulatory Visit | Attending: Physician Assistant | Admitting: Physician Assistant

## 2019-12-23 ENCOUNTER — Other Ambulatory Visit: Payer: Self-pay

## 2019-12-23 DIAGNOSIS — R911 Solitary pulmonary nodule: Secondary | ICD-10-CM | POA: Diagnosis present

## 2019-12-27 ENCOUNTER — Telehealth: Payer: Self-pay | Admitting: Nurse Practitioner

## 2019-12-27 DIAGNOSIS — Z952 Presence of prosthetic heart valve: Secondary | ICD-10-CM

## 2019-12-27 DIAGNOSIS — R918 Other nonspecific abnormal finding of lung field: Secondary | ICD-10-CM

## 2019-12-27 NOTE — Telephone Encounter (Signed)
-----   Message from Eileen Stanford, PA-C sent at 12/27/2019 11:24 AM EST ----- Has anyone addressed this? Thank you! ----- Message ----- From: Interface, Rad Results In Sent: 12/23/2019   2:09 PM EST To: Eileen Stanford, PA-C

## 2019-12-27 NOTE — Telephone Encounter (Signed)
Reviewed results and plan of care with patient who verbalized understanding. She is aware she will receive a call for follow-up with pulmonary at a later time. Patient thanked me for the call.

## 2020-01-10 ENCOUNTER — Other Ambulatory Visit: Payer: Self-pay | Admitting: Gastroenterology

## 2020-01-10 DIAGNOSIS — R7989 Other specified abnormal findings of blood chemistry: Secondary | ICD-10-CM

## 2020-01-19 ENCOUNTER — Ambulatory Visit
Admission: RE | Admit: 2020-01-19 | Discharge: 2020-01-19 | Disposition: A | Discharge status: Home / Self Care | Payer: Federal, State, Local not specified - PPO | Source: Ambulatory Visit | Attending: Gastroenterology | Admitting: Gastroenterology

## 2020-01-19 DIAGNOSIS — R7989 Other specified abnormal findings of blood chemistry: Secondary | ICD-10-CM

## 2020-01-19 MED ORDER — IOPAMIDOL (ISOVUE-300) INJECTION 61%
80.0000 mL | Freq: Once | INTRAVENOUS | Status: AC | PRN
  Administered 2020-01-19: 80 mL via INTRAVENOUS

## 2020-01-31 ENCOUNTER — Other Ambulatory Visit: Payer: Federal, State, Local not specified - PPO

## 2020-02-14 ENCOUNTER — Encounter: Payer: Self-pay | Admitting: Cardiology

## 2020-02-15 ENCOUNTER — Ambulatory Visit: Payer: Federal, State, Local not specified - PPO

## 2020-02-15 ENCOUNTER — Other Ambulatory Visit: Payer: Self-pay

## 2020-02-15 DIAGNOSIS — I6523 Occlusion and stenosis of bilateral carotid arteries: Secondary | ICD-10-CM

## 2020-02-15 DIAGNOSIS — T8203XD Leakage of heart valve prosthesis, subsequent encounter: Secondary | ICD-10-CM

## 2020-02-15 DIAGNOSIS — R0609 Other forms of dyspnea: Secondary | ICD-10-CM

## 2020-02-15 DIAGNOSIS — Z952 Presence of prosthetic heart valve: Secondary | ICD-10-CM

## 2020-02-15 DIAGNOSIS — R0600 Dyspnea, unspecified: Secondary | ICD-10-CM

## 2020-02-22 ENCOUNTER — Other Ambulatory Visit: Payer: Federal, State, Local not specified - PPO

## 2020-02-24 ENCOUNTER — Other Ambulatory Visit: Payer: Self-pay | Admitting: Oncology

## 2020-02-24 DIAGNOSIS — R911 Solitary pulmonary nodule: Secondary | ICD-10-CM

## 2020-02-24 NOTE — Progress Notes (Signed)
  Pulmonary Nodule Clinic Telephone Note [place]   Received referral from [name] [name], PCP.   HPI: [name] [name] is an 87 year old female with past medical history significant for TIA, hypertension, CAD, allergic rhinitis, severe aortic stenosis, lumbar spondylosis, hyperlipidemia and history of TAVR who had CT chest without contrast on 12/23/2019 for follow-up of a pulmonary nodule on cardiac CT.  CT chest without contrast showed stable small pulmonary nodules that have been present for greater than 18 months with mild groundglass changes.  Findings are likely benign but given that there is a sub solid component recommend 1 year follow-up.  Review and Recommendations: I personally reviewed most recent imaging; CT chest without contrast  I recommend follow-up with non contrast CT chest in 1 year from previous.  Social History:  Tobacco Use: Low Risk   . Smoking Tobacco Use: Never Smoker  . Smokeless Tobacco Use: Never Used    High risk factors include: History of heavy smoking, exposure to asbestos, radium or uranium, personal family history of lung cancer, older age, sex (females greater than males), race (black and native [place] greater than weight), marginal speculation, upper lobe location, multiplicity (less than 5 nodules increases risk for malignancy) and emphysema and/or pulmonary fibrosis.   This recommendation follows the consensus statement: Guidelines for Management of Incidental Pulmonary Nodules Detected on CT Images: From the Fleischner Society 2017; Radiology 2017; 284:228-243.    I have placed order for CT scan without contrast to be completed approximately 10 months lung nodule follow-up.  Disposition: Order placed for repeat CT chest. Will notify [name] [name] in scheduling. [place] to call patient with appointment date and time. Return to pulmonary nodule clinic a few days after his repeat imaging to discuss results and plan moving  forward.  Faythe Casa, NP 02/24/2020 2:23 PM

## 2020-02-28 ENCOUNTER — Ambulatory Visit: Payer: Federal, State, Local not specified - PPO | Admitting: Cardiology

## 2020-02-28 ENCOUNTER — Other Ambulatory Visit: Payer: Self-pay

## 2020-02-28 ENCOUNTER — Encounter: Payer: Self-pay | Admitting: Cardiology

## 2020-02-28 VITALS — BP 130/68 | HR 73 | Temp 97.4°F | Resp 17 | Ht 60.0 in | Wt 117.0 lb

## 2020-02-28 DIAGNOSIS — I6523 Occlusion and stenosis of bilateral carotid arteries: Secondary | ICD-10-CM

## 2020-02-28 DIAGNOSIS — I2510 Atherosclerotic heart disease of native coronary artery without angina pectoris: Secondary | ICD-10-CM

## 2020-02-28 DIAGNOSIS — Z952 Presence of prosthetic heart valve: Secondary | ICD-10-CM

## 2020-02-28 DIAGNOSIS — R945 Abnormal results of liver function studies: Secondary | ICD-10-CM

## 2020-02-28 DIAGNOSIS — T8203XA Leakage of heart valve prosthesis, initial encounter: Secondary | ICD-10-CM

## 2020-02-28 DIAGNOSIS — R7989 Other specified abnormal findings of blood chemistry: Secondary | ICD-10-CM

## 2020-02-28 DIAGNOSIS — I10 Essential (primary) hypertension: Secondary | ICD-10-CM

## 2020-02-28 NOTE — Progress Notes (Addendum)
Primary Physician/Referring:  Kelton Pillar, MD  Patient ID: [name] [name], female    DOB: 04-Oct-1931, 87 y.o.   MRN: [id number]  Chief Complaint  Patient presents with  . Shortness of Breath  . CAROTID STENOSIS    6 MONTH   HPI:    [name] [name]  is a 87 y.o. Caucasian female with history of benign positional vertigo, chronic dyspnea, chronic back pain, hypertension, mixed hyperlipidemia and  symptomatic aortic stenosis, CAD S/P PCI to LAD/D1  on 01/05/2019. Underwent elective TAVR on 01/26/2019, mild to moderate paravalvular leak. She now presents for 6 month F/U. She has aspirin anaphylaxis hence only on Plavix.  Patient is now being evaluated for elevated LFTs, she had markedly abnormal ALT and AST and also alkaline phosphatase.  She is now on steroids for the same and follows Dr. Wilford Corner. She presents for routine 10-monthvisit and states that her dyspnea has remained stable.  She has not had any PND or orthopnea or leg edema.  Denies chest pain or palpitations.  No symptoms to suggest TIA.  Past Medical History:  Diagnosis Date  . Arthritis   . Asthma    history of  . CAD (coronary artery disease)   . Dyspnea    sometimes sitting.lying and on exertion  . GERD (gastroesophageal reflux disease)   . History of hiatal hernia   . History of transcatheter aortic valve replacement (TAVR) 01/26/2019   (TAVR  23 mm Evolute Pro in aortic position 01/26/2019) .   .Marland KitchenHyperlipidemia   . Hypertension   . Hypothyroidism   . Lipoma    left arm  . Nasal polyps   . Pneumonia   . Pulmonary nodule    noted on pre TAVR CT  . Severe aortic stenosis    Past Surgical History:  Procedure Laterality Date  . CHOLECYSTECTOMY  02/27/04  . CORONARY ATHERECTOMY N/A 01/05/2019   Procedure: CORONARY ATHERECTOMY;  Surgeon: GAdrian Prows MD;  [facility];  Service: Cardiovascular;  Laterality: N/A;  . CORONARY BALLOON ANGIOPLASTY N/A 01/05/2019   Procedure: CORONARY BALLOON  ANGIOPLASTY;  Surgeon: GAdrian Prows MD;  [facility];  Service: Cardiovascular;  Laterality: N/A;  . CORONARY STENT INTERVENTION N/A 01/05/2019   Procedure: CORONARY STENT INTERVENTION;  Surgeon: GAdrian Prows MD;  [facility];  Service: Cardiovascular;  Laterality: N/A;  . CORONARY STENT INTERVENTION  01/05/2019  . INCONTINENCE SURGERY  09/12/09  . NASAL SINUS SURGERY     several    . ORIF ANKLE FRACTURE Right 09/11/2013   Procedure: OPEN REDUCTION INTERNAL FIXATION (ORIF) ANKLE FRACTURE;  Surgeon: SAugustin Schooling MD;  Location: [facility];  Service: Orthopedics;  Laterality: Right;  . RIGHT/LEFT HEART CATH AND CORONARY ANGIOGRAPHY N/A 12/15/2018   Procedure: RIGHT/LEFT HEART CATH AND CORONARY ANGIOGRAPHY;  Surgeon: GAdrian Prows MD;  [facility];  Service: Cardiovascular;  Laterality: N/A;  . ROTATOR CUFF REPAIR  09/27/2003  . TRANSCATHETER AORTIC VALVE REPLACEMENT, TRANSFEMORAL  01/26/2019  . TRANSCATHETER AORTIC VALVE REPLACEMENT, TRANSFEMORAL N/A 01/26/2019   Procedure: TRANSCATHETER AORTIC VALVE REPLACEMENT, TRANSFEMORAL;  Surgeon: MBurnell Blanks MD;  [place] [facility]  Service: Open Heart Surgery;  Laterality: N/A;  . TRIGGER FINGER RELEASE  10/29/06   Family History  Problem Relation Age of Onset  . Tuberculosis Mother   . Other Father        Bronchiectasis  . Cancer Maternal Grandmother  ovarian  . Sarcoidosis Sister   . Asthma Maternal Grandfather   . Heart disease Brother   . Heart attack Brother   . Breast cancer Daughter     Social History   Tobacco Use  . Smoking status: Never Smoker  . Smokeless tobacco: Never Used  Substance Use Topics  . Alcohol use: No    Marital Status: Married  ROS  Review of Systems  Cardiovascular: Positive for dyspnea on exertion (chronic and imporved). Negative for chest pain, leg swelling, palpitations and syncope.  Respiratory: Positive for wheezing (occasional and chronic).    Musculoskeletal: Positive for arthritis, back pain and joint pain.  Gastrointestinal: Negative for melena.   Objective  Blood pressure 130/68, pulse 73, temperature (!) 97.4 F (36.3 C), temperature source Temporal, resp. rate 17, height 5' (1.524 m), weight 117 lb (53.1 kg), SpO2 98 %. Body mass index is 22.85 kg/m.   Vitals with BMI 02/28/2020 10/04/2019 06/23/2019  Height '5\' 0"'  '5\' 0"'  '5\' 0"'   Weight 117 lbs 117 lbs 118 lbs  BMI 22.85 74.94 49.67  Systolic 591 638 466  Diastolic 68 61 66  Pulse 73 59 74    Physical Exam Constitutional:      General: She is not in acute distress.    Appearance: She is well-developed.  Cardiovascular:     Rate and Rhythm: Normal rate and regular rhythm.     Pulses: Intact distal pulses.          Carotid pulses are on the right side with bruit and on the left side with bruit.      Popliteal pulses are 2+ on the right side and 2+ on the left side.       Dorsalis pedis pulses are 2+ on the right side and 2+ on the left side.       Posterior tibial pulses are 2+ on the right side and 2+ on the left side.     Heart sounds: S1 normal and S2 normal. Murmur heard.   Harsh midsystolic murmur is present with a grade of 3/6 at the upper right sternal border radiating to the neck. No gallop.      Comments: No leg edema.  No JVD.   Pulmonary:     Effort: Pulmonary effort is normal.     Breath sounds: Normal breath sounds.  Abdominal:     General: Bowel sounds are normal.     Palpations: Abdomen is soft.    Laboratory examination:   Recent Labs    03/05/19 0841 11/30/19 1007  NA 132* 138  K 4.1 4.5  CL 98 101  CO2 24 24  GLUCOSE 113* 104*  BUN 17 18  CREATININE 1.36* 1.36*  CALCIUM 8.9 9.7  GFRNONAA 35* 35*  GFRAA 41* 41*   CMP Latest Ref Rng & Units 11/30/2019 03/05/2019 02/23/2019  Glucose 65 - 99 mg/dL 104(H) 113(H) 177(H)  BUN 8 - 27 mg/dL '18 17 12  ' Creatinine 0.57 - 1.00 mg/dL 1.36(H) 1.36(H) 1.33(H)  Sodium 134 - 144 mmol/L 138 132(L) 135   Potassium 3.5 - 5.2 mmol/L 4.5 4.1 4.6  Chloride 96 - 106 mmol/L 101 98 96  CO2 20 - 29 mmol/L '24 24 23  ' Calcium 8.7 - 10.3 mg/dL 9.7 8.9 9.3  Total Protein 6.0 - 8.5 g/dL 7.3 - -  Total Bilirubin 0.0 - 1.2 mg/dL 0.6 - -  Alkaline Phos 44 - 121 IU/L 637(H) - -  AST 0 - 40 IU/L 106(H) - -  ALT 0 - 32 IU/L 78(H) - -   CBC Latest Ref Rng & Units 02/23/2019 01/27/2019 01/26/2019  WBC 3.4 - 10.8 x10E3/uL 8.6 8.0 -  Hemoglobin 11.1 - 15.9 g/dL 9.6(L) 8.0(L) 9.5(L)  Hematocrit 34.0 - 46.6 % 30.0(L) 24.9(L) 28.0(L)  Platelets 150 - 450 x10E3/uL 287 171 -   Lipid Panel Recent Labs    11/30/19 1007  CHOL 163  TRIG 83  LDLCALC 85  HDL 62    HEMOGLOBIN A1C Lab Results  Component Value Date   HGBA1C 4.9 01/21/2019   MPG 93.93 01/21/2019   TSH No results for input(s): TSH in the last 8760 hours.  External Labs:   Labs 02/15/2020: LFTs normal, serum glucose 113 mg, BUN 25, creatinine 1.27, EGFR 40 mL, sodium 137, potassium 3.7.  Alkaline phosphatase 518.  Hb 12.3/HCT 35.9, platelets 244.  Cholesterol, total 212.000 09/21/2019 HDL 65.000 09/21/2019 LDL-C 130.000 09/21/2019 Triglycerides 97.000 09/21/2019  A1C 4.900 01/21/2019 TSH 2.070 09/21/2019  Hemoglobin 12.300 09/21/2019  Creatinine, Serum 1.260 09/21/2019 Potassium 4.800 09/21/2019 Magnesium N/D ALT (SGPT) 59.000 09/21/2019  Hemoglobin 10.400 03/22/2019; INR 1.100 01/21/2019 Platelets 287.000 02/23/2019  Creatinine, Serum 1.250 03/22/2019 Potassium 5.100 03/22/2019 ALT (SGPT) 47.000 03/22/2019  TSH 3.200 03/22/2019  07/25/2017: Hemoglobin A1c 9%. Creatinine 1.16, EGFR 54, potassium 4.5, alkaline loss 420, AST 55, CMP otherwise normal. ( PCP has held Zetia and simvastatin for 2 months due to elevated LFTs). Hemoglobin 11.8.   Cholesterol 157, triglycerides 105, HDL 60, LDL 78.  05/27/2017: Cholesterol 208, triglycerides 84, HDL 81, LDL 110.  Medications and allergies   Allergies  Allergen Reactions  . Aspirin Anaphylaxis  . Ceclor  [Cefaclor] Anaphylaxis  . Statins Other (See Comments)    Elevated liver enzymes  . Sulfa Antibiotics Rash   Current Outpatient Medications on File Prior to Visit  Medication Sig Dispense Refill  . amoxicillin (AMOXIL) 500 MG capsule Take 500 mg by mouth as needed. Take medication only before dental procedures.    Marland Kitchen atorvastatin (LIPITOR) 10 MG tablet Take 1 tablet (10 mg total) by mouth daily. 90 tablet 3  . cetirizine (ZYRTEC) 10 MG tablet Take 10 mg by mouth daily.    . cholecalciferol (VITAMIN D) 25 MCG (1000 UT) tablet Take 1,000 Units by mouth daily.    . clopidogrel (PLAVIX) 75 MG tablet TAKE 1 TABLET BY MOUTH EVERY DAY 90 tablet 1  . FERREX 150 150 MG capsule Take 150 mg by mouth as needed.     Marland Kitchen levothyroxine (SYNTHROID) 50 MCG tablet Take 50 mcg by mouth daily before breakfast.    . meclizine (ANTIVERT) 25 MG tablet Take 25 mg by mouth 3 (three) times daily as needed for dizziness.     . mometasone (NASONEX) 50 MCG/ACT nasal spray SMARTSIG:Both Nares    . Multiple Vitamin (MULTIVITAMIN WITH MINERALS) TABS tablet Take 1 tablet by mouth 4 (four) times a week.     Vladimir Faster Glycol-Propyl Glycol (SYSTANE) 0.4-0.3 % SOLN Place 1 drop into both eyes 3 (three) times daily as needed (dry/irritated eyes.).    Marland Kitchen predniSONE (DELTASONE) 10 MG tablet Take 10 mg by mouth 4 (four) times daily.    . propranolol (INDERAL) 40 MG tablet TAKE 1 TABLET (40 MG TOTAL) BY MOUTH 3 (THREE) TIMES DAILY. 270 tablet 3  . ezetimibe (ZETIA) 10 MG tablet Take 10 mg by mouth daily.      No current facility-administered medications on file prior to visit.    Radiology:   CT cardiac  morphology 03/14/2019: 1. Well positioned 23 mm Evolut Pro valve with normal appearing leaflets. Good coaptation with no HALT/HAM. Although target diameters for inflow, waist, outflow in systole are slightly low the mechanism of worsening PVL appears to be primarily a large area of calcification posterior to the stent valve from  the native non coronary cusp. This area of calcium measures 4.9 x 5.7 mm and is best seen on SA images at 7:00.  Extracardiac:  1. Right middle lobe nodule is stable from 09/12/2018. Additional follow-up CT chest without contrast in 12 months is recommended to ensure stability and exclude malignancy. This recommendation follows the consensus statement: Guidelines for Management of Small Pulmonary Nodules Detected on CT Images: From the Fleischner Society 2017; Radiology 2017; 284:228-243. 2.  Aortic atherosclerosis (ICD10-I70.0). 3. Large hiatal hernia.  Cardiac Studies:    Exercise myoview stress 04/25/2017: 1. The patient performed treadmill exercise using a Bruce protocol, completing 5 minutes. The patient completed an estimated workload of 4.88 METS, reaching 103% of the maximum predicted heart rate. Stress symptoms included dyspnea. Exercise capacity fair for age. Normal hemodynamic response. Possible old anteroseptal infarct. Poor R wave progression. No ischemic changes on stress electrocardiogram. 2. The overall quality of the study is excellent. There is no evidence of abnormal lung activity. Stress and rest SPECT images demonstrate homogeneous tracer distribution throughout the myocardium. Gated SPECT imaging reveals normal myocardial thickening and wall motion. The left ventricular ejection fraction was normal (77%). 3. Low risk study.  Right and left heart catheterization 12/15/2018: RA 5/5, mean 5; RV 31/3, EDP 6; PA 32/14, mean 21, PA saturation 67%; PW 11/10, mean 10 mmHg.  CO 3.84, CI  2.5756. QP/QS 1.0.  Normal right heart catheterization. Mildly elevated LVEDP at 15 mmHg.  Peak aortic valve gradient of 27, mean 23.6 mmHg with calculated aortic valve area 0.76 cm.  Findings consistent with severe calcific degenerative aortic valve stenosis. Left dominant circulation, proximal LAD 80% stenosis, large D1 with ostial 90% stenosis, medium sized RI with a proximal 90% stenosis,  dominant circumflex mild disease.  RCA mild disease and nondominant.  Recommendation: Patient will need to be evaluated for CABG and aortic valve replacement.  If the TAVR team feels she is a good candidate for TAVR, we could certainly consider either medical management of CAD or PCI to proximal LAD and TAVR. Patient be discharged home today with outpatient follow-up.  Patient has been referred to be evaluated by Dr. Gilford Raid. 30 mL contrast utilized.  Left Heart Catheterization 01/05/2019:  Successful orbital atherectomy followed by stenting of the proximal LAD with a 3.0 x 18 mm resolute Onyx DES and proximal segment postdilated with a 3.5 x 8 mm Sapphire [place].  Stenosis 90% to 0%.  TIMI-3 to TIMI-3 flow.  Orbital atherectomy followed by balloon angioplasty with 2.5 x 15 mm Sapphire balloon for D1, 90% to less than 15 to 20% stenosis.  TIMI-3 to TIMI-3 flow.  Carotid artery duplex 09/28/2019:  Stenosis in the right internal carotid artery (16-49%).  Stenosis in the left internal carotid artery (50-69%).  Antegrade right vertebral artery flow. Antegrade left vertebral artery  flow.  Follow up in six months is appropriate if clinically indicated. NO  significant change from 03/22/2019.  Echocardiogram 02/15/2020: Normal LV systolic function with visual EF 55-60%. Left ventricle cavity is normal in size. Mild to moderate left ventricular hypertrophy. Normal global wall motion. Doppler evidence of grade II diastolic dysfunction, elevated LAP.  Left atrial cavity is mildly dilated. Bioprosthetic  aortic valve (TAVR 23 mm Evolute Pro 01/26/2019) well seated, trivial paravalvular regurgitation.   No aortic stenosis. Mild to moderate valvular regurgitation. Mild (Grade I) mitral regurgitation. Moderate tricuspid regurgitation. No evidence of pulmonary hypertension. Mild pulmonic regurgitation. Compared to prior study dated 09/28/2019: G1DD is now G2DD with elevated LAP otherwise no significant  change.    Carotid artery duplex 02/15/2020: Doppler velocity suggests stenosis in the right internal carotid artery (50-69%). Stenosis in the right external carotid artery (<50%). Doppler velocity suggests stenosis in the left internal carotid artery (16-49%). Stenosis in the left external carotid artery (<50%). Antegrade right vertebral artery flow. Antegrade left vertebral artery flow. Compared to 09/28/2019, there is progression of disease severity in right and regression in the left carotid artery. Follow up in six months is appropriate if clinically indicated.    EKG    EKG 02/28/2020: Normal sinus rhythm at rate of 80 bpm, biatrial enlargement, left bundle branch block.  No further analysis.   No significant change from EKG 06/23/2019.  Assessment     ICD-10-CM   1. Coronary artery disease involving native coronary artery of native heart without angina pectoris  I25.10   2. Primary hypertension  I10 EKG 12-Lead  3. S/P TAVR (transcatheter aortic valve replacement)  Z95.2   4. Paravalvular leak of prosthetic heart valve, initial encounter  T82.03XA   5. Bilateral carotid artery stenosis  I65.23   6. Abnormal LFTs  R94.5      No orders of the defined types were placed in this encounter.  Medications Discontinued During This Encounter  Medication Reason  . ibuprofen (ADVIL) 200 MG tablet Patient Preference     Recommendations:   [name] [name]  is a 87 y.o. Caucasian female with history of benign positional vertigo, chronic dyspnea, chronic back pain, hypertension, mixed hyperlipidemia and  symptomatic aortic stenosis, CAD S/P PCI to LAD/D1  on 01/05/2019. Underwent elective TAVR on 01/26/2019, mild to moderate paravalvular leak. She now presents for 6 month F/U. She has aspirin anaphylaxis hence only on Plavix.  Patient is now being evaluated for elevated LFTs, she had markedly abnormal ALT and AST and also alkaline phosphatase.  She is now on steroids for the same and follows  Dr. Wilford Corner.  Zetia has been on hold.  She is presently on 10 mg of Lipitor.  Once no contraindication from GI standpoint, I would like to resume Zetia or a different agent in view of underlying coronary disease and carotid disease.  I reviewed her echocardiogram, paravalvular leak appears to be stable or even slightly improved compared to previous.  I do not hear diastolic murmur. She has stable NYHA class II symptoms.  Carotid artery duplex has remained stable with moderate disease on the right and mild disease on the left.  CBC is improved and anemia has resolved since TAVR.  Continue Plavix as she is allergic to aspirin with anaphylaxis.  She is aware of endocarditis prophylaxis.  I will see her back in 6 months.   Adrian Prows, MD, [facility] 02/28/2020, 2:55 PM Office: [phone]   CC: Wilford Corner (GI) ; Kelton Pillar, MD

## 2020-03-20 ENCOUNTER — Encounter: Payer: Self-pay | Admitting: Cardiology

## 2020-03-20 ENCOUNTER — Other Ambulatory Visit: Payer: Self-pay

## 2020-03-20 ENCOUNTER — Ambulatory Visit (INDEPENDENT_AMBULATORY_CARE_PROVIDER_SITE_OTHER): Payer: Federal, State, Local not specified - PPO | Admitting: Cardiology

## 2020-03-20 VITALS — BP 140/76 | HR 85 | Temp 97.7°F | Resp 16 | Ht 60.0 in | Wt 121.0 lb

## 2020-03-20 DIAGNOSIS — I10 Essential (primary) hypertension: Secondary | ICD-10-CM

## 2020-03-20 DIAGNOSIS — R0602 Shortness of breath: Secondary | ICD-10-CM

## 2020-03-20 DIAGNOSIS — Z952 Presence of prosthetic heart valve: Secondary | ICD-10-CM

## 2020-03-20 DIAGNOSIS — I5033 Acute on chronic diastolic (congestive) heart failure: Secondary | ICD-10-CM

## 2020-03-20 MED ORDER — FUROSEMIDE 20 MG PO TABS: 20.0000 mg | ORAL_TABLET | Freq: Two times a day (BID) | ORAL | 2 refills | Status: DC | PRN

## 2020-03-20 NOTE — Patient Instructions (Signed)
Take furosemide 1 tablet twice daily for the next 3 days, after that he can take 1 tablet once a day for the next 1 week.  Once you start feeling better, he can stop taking the furosemide and take it only as needed for leg swelling and shortness of breath.  Please call if you have any concerns or you continue to have recurrent episodes of dyspnea and leg swelling.

## 2020-03-20 NOTE — Progress Notes (Signed)
Primary Physician/Referring:  Kelton Pillar, MD  Patient ID: [name] [name], female    DOB: 1931-11-05, 87 y.o.   MRN: [id number]  Chief Complaint  Patient presents with   Shortness of Breath   Follow-up    STAT APPT   HPI:    [name] [name]  is a 87 y.o. Caucasian female with history of benign positional vertigo, chronic dyspnea, chronic back pain, hypertension, mixed hyperlipidemia and  symptomatic aortic stenosis, CAD S/P PCI to LAD/D1  on 01/05/2019. Underwent elective TAVR on 01/26/2019, mild to moderate paravalvular leak. She now presents for 6 month F/U. She has aspirin anaphylaxis hence only on Plavix.  Patient is now being evaluated for elevated LFTs, she had markedly abnormal ALT and AST and also alkaline phosphatase.  She is now on steroids for the same and follows Dr. Wilford Corner.  This is a sick working visit, patient came in with her husband for his visit and mentioned to my staff regarding worsening shortness of breath, orthopnea and PND and leg edema.  Hence I worked her in.  Patient states that over the past 2 weeks or so she has noticed worsening shortness of breath.  She had had difficulty in laying down flat and has been sitting in a recliner.  She has also noticed her leg edema has recurred.  No hemoptysis, no painful swelling of the lower extremity.  Past Medical History:  Diagnosis Date   Arthritis    Asthma    history of   CAD (coronary artery disease)    Dyspnea    sometimes sitting.lying and on exertion   GERD (gastroesophageal reflux disease)    History of hiatal hernia    History of transcatheter aortic valve replacement (TAVR) 01/26/2019   (TAVR  23 mm Evolute Pro in aortic position 01/26/2019) .    Hyperlipidemia    Hypertension    Hypothyroidism    Lipoma    left arm   Nasal polyps    Pneumonia    Pulmonary nodule    noted on pre TAVR CT   Severe aortic stenosis    Past Surgical History:  Procedure Laterality Date    CHOLECYSTECTOMY  02/27/04   CORONARY ATHERECTOMY N/A 01/05/2019   Procedure: CORONARY ATHERECTOMY;  Surgeon: Adrian Prows, MD;  Location: [facility];  Service: Cardiovascular;  Laterality: N/A;   CORONARY BALLOON ANGIOPLASTY N/A 01/05/2019   Procedure: CORONARY BALLOON ANGIOPLASTY;  Surgeon: Adrian Prows, MD;  Location: [facility];  Service: Cardiovascular;  Laterality: N/A;   CORONARY STENT INTERVENTION N/A 01/05/2019   Procedure: CORONARY STENT INTERVENTION;  Surgeon: Adrian Prows, MD;  Location: [facility];  Service: Cardiovascular;  Laterality: N/A;   CORONARY STENT INTERVENTION  01/05/2019   INCONTINENCE SURGERY  09/12/09   NASAL SINUS SURGERY     several     ORIF ANKLE FRACTURE Right 09/11/2013   Procedure: OPEN REDUCTION INTERNAL FIXATION (ORIF) ANKLE FRACTURE;  Surgeon: Augustin Schooling, MD;  Location: [facility];  Service: Orthopedics;  Laterality: Right;   RIGHT/LEFT HEART CATH AND CORONARY ANGIOGRAPHY N/A 12/15/2018   Procedure: RIGHT/LEFT HEART CATH AND CORONARY ANGIOGRAPHY;  Surgeon: Adrian Prows, MD;  Location: [facility];  Service: Cardiovascular;  Laterality: N/A;   ROTATOR CUFF REPAIR  09/27/2003   TRANSCATHETER AORTIC VALVE REPLACEMENT, TRANSFEMORAL  01/26/2019   TRANSCATHETER AORTIC VALVE REPLACEMENT, TRANSFEMORAL N/A 01/26/2019   Procedure: TRANSCATHETER AORTIC VALVE REPLACEMENT, TRANSFEMORAL;  Surgeon: Burnell Blanks, MD;  Location:  [facility];  Service: Open Heart Surgery;  Laterality: N/A;   TRIGGER FINGER RELEASE  10/29/06   Family History  Problem Relation Age of Onset   Tuberculosis Mother    Other Father        Bronchiectasis   Cancer Maternal Grandmother        ovarian   Sarcoidosis Sister    Asthma Maternal Grandfather    Heart disease Brother    Heart attack Brother    Breast cancer Daughter     Social History   Tobacco Use   Smoking status: Never Smoker   Smokeless tobacco: Never Used  Substance Use Topics    Alcohol use: No    Marital Status: Married  ROS  Review of Systems  Cardiovascular: Positive for dyspnea on exertion and leg swelling. Negative for chest pain, palpitations and syncope.  Respiratory: Positive for wheezing (occasional and chronic).   Musculoskeletal: Positive for arthritis, back pain and joint pain.  Gastrointestinal: Negative for melena.   Objective  Blood pressure 140/76, pulse 85, temperature 97.7 F (36.5 C), temperature source Temporal, resp. rate 16, height 5' (1.524 m), weight 121 lb (54.9 kg), SpO2 98 %. Body mass index is 23.63 kg/m.   Vitals with BMI 03/20/2020 02/28/2020 10/04/2019  Height _0  _1  _2   Weight 121 lbs 117 lbs 117 lbs  BMI 23.63 35.36 14.43  Systolic 154 008 676  Diastolic 76 68 61  Pulse 85 73 59    Physical Exam Constitutional:      General: She is not in acute distress.    Appearance: She is well-developed.  Cardiovascular:     Rate and Rhythm: Normal rate and regular rhythm.     Pulses: Intact distal pulses.          Carotid pulses are on the right side with bruit and on the left side with bruit.      Popliteal pulses are 2+ on the right side and 2+ on the left side.       Dorsalis pedis pulses are 2+ on the right side and 2+ on the left side.       Posterior tibial pulses are 2+ on the right side and 2+ on the left side.     Heart sounds: S1 normal and S2 normal. Murmur heard.   Harsh midsystolic murmur is present with a grade of 3/6 at the upper right sternal border radiating to the neck. No gallop.      Comments: 2+ ankle edema bilateral.  No JVD.   Pulmonary:     Effort: Pulmonary effort is normal.     Breath sounds: Normal breath sounds.  Abdominal:     General: Bowel sounds are normal.     Palpations: Abdomen is soft.    Laboratory examination:   Recent Labs    11/30/19 1007  NA 138  K 4.5  CL 101  CO2 24  GLUCOSE 104*  BUN 18  CREATININE 1.36*  CALCIUM 9.7  GFRNONAA 35*  GFRAA 41*   CMP Latest Ref  Rng & Units 11/30/2019 03/05/2019 02/23/2019  Glucose 65 - 99 mg/dL 104(H) 113(H) 177(H)  BUN 8 - 27 mg/dL _3 Creatinine 0.57 - 1.00 mg/dL 1.36(H) 1.36(H) 1.33(H)  Sodium 134 - 144 mmol/L 138 132(L) 135  Potassium 3.5 - 5.2 mmol/L 4.5 4.1 4.6  Chloride 96 - 106 mmol/L 101 98 96  CO2 20 - 29 mmol/L _4 Calcium 8.7 - 10.3 mg/dL  9.7 8.9 9.3  Total Protein 6.0 - 8.5 g/dL 7.3 - -  Total Bilirubin 0.0 - 1.2 mg/dL 0.6 - -  Alkaline Phos 44 - 121 IU/L 637(H) - -  AST 0 - 40 IU/L 106(H) - -  ALT 0 - 32 IU/L 78(H) - -   CBC Latest Ref Rng & Units 02/23/2019 01/27/2019 01/26/2019  WBC 3.4 - 10.8 x10E3/uL 8.6 8.0 -  Hemoglobin 11.1 - 15.9 g/dL 9.6(L) 8.0(L) 9.5(L)  Hematocrit 34.0 - 46.6 % 30.0(L) 24.9(L) 28.0(L)  Platelets 150 - 450 x10E3/uL 287 171 -   Lipid Panel Recent Labs    11/30/19 1007  CHOL 163  TRIG 83  LDLCALC 85  HDL 62    HEMOGLOBIN A1C Lab Results  Component Value Date   HGBA1C 4.9 01/21/2019   MPG 93.93 01/21/2019   TSH No results for input(s): TSH in the last 8760 hours.  External Labs:   Labs 02/15/2020: LFTs normal, serum glucose 113 mg, BUN 25, creatinine 1.27, EGFR 40 mL, sodium 137, potassium 3.7.  Alkaline phosphatase 518.  Hb 12.3/HCT 35.9, platelets 244.  Cholesterol, total 212.000 09/21/2019 HDL 65.000 09/21/2019 LDL-C 130.000 09/21/2019 Triglycerides 97.000 09/21/2019  A1C 4.900 01/21/2019 TSH 2.070 09/21/2019  Hemoglobin 12.300 09/21/2019  Creatinine, Serum 1.260 09/21/2019 Potassium 4.800 09/21/2019 Magnesium N/D ALT (SGPT) 59.000 09/21/2019  Hemoglobin 10.400 03/22/2019; INR 1.100 01/21/2019 Platelets 287.000 02/23/2019  Creatinine, Serum 1.250 03/22/2019 Potassium 5.100 03/22/2019 ALT (SGPT) 47.000 03/22/2019  TSH 3.200 03/22/2019  07/25/2017: Hemoglobin A1c 9%. Creatinine 1.16, EGFR 54, potassium 4.5, alkaline loss 420, AST 55, CMP otherwise normal. ( PCP has held Zetia and simvastatin for 2 months due to elevated LFTs). Hemoglobin 11.8.    Cholesterol 157, triglycerides 105, HDL 60, LDL 78.  05/27/2017: Cholesterol 208, triglycerides 84, HDL 81, LDL 110.  Medications and allergies   Allergies  Allergen Reactions   Aspirin Anaphylaxis   Ceclor [Cefaclor] Anaphylaxis   Statins Other (See Comments)    Elevated liver enzymes   Sulfa Antibiotics Rash   Current Outpatient Medications on File Prior to Visit  Medication Sig Dispense Refill   atorvastatin (LIPITOR) 10 MG tablet Take 1 tablet (10 mg total) by mouth daily. 90 tablet 3   cetirizine (ZYRTEC) 10 MG tablet Take 10 mg by mouth daily.     cholecalciferol (VITAMIN D) 25 MCG (1000 UT) tablet Take 1,000 Units by mouth daily.     clopidogrel (PLAVIX) 75 MG tablet TAKE 1 TABLET BY MOUTH EVERY DAY 90 tablet 1   levothyroxine (SYNTHROID) 50 MCG tablet Take 50 mcg by mouth daily before breakfast.     meclizine (ANTIVERT) 25 MG tablet Take 25 mg by mouth 3 (three) times daily as needed for dizziness.      mometasone (NASONEX) 50 MCG/ACT nasal spray SMARTSIG:Both Nares     Multiple Vitamin (MULTIVITAMIN WITH MINERALS) TABS tablet Take 1 tablet by mouth 4 (four) times a week.      Polyethyl Glycol-Propyl Glycol (SYSTANE) 0.4-0.3 % SOLN Place 1 drop into both eyes 3 (three) times daily as needed (dry/irritated eyes.).     predniSONE (DELTASONE) 10 MG tablet Take 10 mg by mouth in the morning, at noon, and at bedtime.     amoxicillin (AMOXIL) 500 MG capsule Take 500 mg by mouth as needed. Take medication only before dental procedures. (Patient not taking: Reported on 03/20/2020)     ezetimibe (ZETIA) 10 MG tablet Take 10 mg by mouth daily.  (Patient not taking: Reported on 03/20/2020)  propranolol (INDERAL) 40 MG tablet Take 20 mg by mouth 3 (three) times daily.     No current facility-administered medications on file prior to visit.    Radiology:   CT cardiac morphology 03/14/2019: 1. Well positioned 23 mm Evolut Pro valve with normal appearing leaflets.  Good coaptation with no HALT/HAM. Although target diameters for inflow, waist, outflow in systole are slightly low the mechanism of worsening PVL appears to be primarily a large area of calcification posterior to the stent valve from the native non coronary cusp. This area of calcium measures 4.9 x 5.7 mm and is best seen on SA images at 7:00.  Extracardiac:  1. Right middle lobe nodule is stable from 09/12/2018. Additional follow-up CT chest without contrast in 12 months is recommended to ensure stability and exclude malignancy. This recommendation follows the consensus statement: Guidelines for Management of Small Pulmonary Nodules Detected on CT Images: From the Fleischner Society 2017; Radiology 2017; 284:228-243. 2.  Aortic atherosclerosis (ICD10-I70.0). 3. Large hiatal hernia.  Cardiac Studies:    Exercise myoview stress 04/25/2017: 1. The patient performed treadmill exercise using a Bruce protocol, completing 5 minutes. The patient completed an estimated workload of 4.88 METS, reaching 103% of the maximum predicted heart rate. Stress symptoms included dyspnea. Exercise capacity fair for age. Normal hemodynamic response. Possible old anteroseptal infarct. Poor R wave progression. No ischemic changes on stress electrocardiogram. 2. The overall quality of the study is excellent. There is no evidence of abnormal lung activity. Stress and rest SPECT images demonstrate homogeneous tracer distribution throughout the myocardium. Gated SPECT imaging reveals normal myocardial thickening and wall motion. The left ventricular ejection fraction was normal (77%). 3. Low risk study.  Right and left heart catheterization 12/15/2018: RA 5/5, mean 5; RV 31/3, EDP 6; PA 32/14, mean 21, PA saturation 67%; PW 11/10, mean 10 mmHg.  CO 3.84, CI  2.5756. QP/QS 1.0.  Normal right heart catheterization. Mildly elevated LVEDP at 15 mmHg.  Peak aortic valve gradient of 27, mean 23.6 mmHg with calculated aortic  valve area 0.76 cm.  Findings consistent with severe calcific degenerative aortic valve stenosis. Left dominant circulation, proximal LAD 80% stenosis, large D1 with ostial 90% stenosis, medium sized RI with a proximal 90% stenosis, dominant circumflex mild disease.  RCA mild disease and nondominant.  Recommendation: Patient will need to be evaluated for CABG and aortic valve replacement.  If the TAVR team feels she is a good candidate for TAVR, we could certainly consider either medical management of CAD or PCI to proximal LAD and TAVR. Patient be discharged home today with outpatient follow-up.  Patient has been referred to be evaluated by Dr. Gilford Raid. 30 mL contrast utilized.  Left Heart Catheterization 01/05/2019:  Successful orbital atherectomy followed by stenting of the proximal LAD with a 3.0 x 18 mm resolute Onyx DES and proximal segment postdilated with a 3.5 x 8 mm Sapphire [place].  Stenosis 90% to 0%.  TIMI-3 to TIMI-3 flow.  Orbital atherectomy followed by balloon angioplasty with 2.5 x 15 mm Sapphire balloon for D1, 90% to less than 15 to 20% stenosis.  TIMI-3 to TIMI-3 flow.  Carotid artery duplex 09/28/2019:  Stenosis in the right internal carotid artery (16-49%).  Stenosis in the left internal carotid artery (50-69%).  Antegrade right vertebral artery flow. Antegrade left vertebral artery  flow.  Follow up in six months is appropriate if clinically indicated. NO  significant change from 03/22/2019.  Echocardiogram 02/15/2020: Normal LV systolic function with visual EF  55-60%. Left ventricle cavity is normal in size. Mild to moderate left ventricular hypertrophy. Normal global wall motion. Doppler evidence of grade II diastolic dysfunction, elevated LAP.  Left atrial cavity is mildly dilated. Bioprosthetic aortic valve (TAVR 23 mm Evolute Pro 01/26/2019) well seated, trivial paravalvular regurgitation.   No aortic stenosis. Mild to moderate valvular regurgitation. Mild (Grade  I) mitral regurgitation. Moderate tricuspid regurgitation. No evidence of pulmonary hypertension. Mild pulmonic regurgitation. Compared to prior study dated 09/28/2019: G1DD is now G2DD with elevated LAP otherwise no significant change.    Carotid artery duplex 02/15/2020: Doppler velocity suggests stenosis in the right internal carotid artery (50-69%). Stenosis in the right external carotid artery (<50%). Doppler velocity suggests stenosis in the left internal carotid artery (16-49%). Stenosis in the left external carotid artery (<50%). Antegrade right vertebral artery flow. Antegrade left vertebral artery flow. Compared to 09/28/2019, there is progression of disease severity in right and regression in the left carotid artery. Follow up in six months is appropriate if clinically indicated.    EKG    EKG 03/12/2020: Normal sinus rhythm at rate of 84 bpm, left atrial enlargement, left bundle branch block.  No further analysis. No significant change from EKG 02/28/2020  Assessment     ICD-10-CM   1. Shortness of breath  R06.02 EKG 12-Lead  2. Acute on chronic diastolic heart failure (HCC)  I50.33   3. S/P TAVR (transcatheter aortic valve replacement)  Z95.2   4. Primary hypertension  I10      No orders of the defined types were placed in this encounter.  Medications Discontinued During This Encounter  Medication Reason   FERREX 150 150 MG capsule Error   propranolol (INDERAL) 40 MG tablet Dose change     Recommendations:   [name] [name]  is a 87 y.o. Caucasian female with history of benign positional vertigo, chronic dyspnea, chronic back pain, hypertension, mixed hyperlipidemia and  symptomatic aortic stenosis, CAD S/P PCI to LAD/D1  on 01/05/2019. Underwent elective TAVR on 01/26/2019, mild to moderate paravalvular leak. She now presents for sick work in F/U.  She came with her husband for follow-up of CAD and requested to be seen as she was having marked dyspnea on exertion  when doing minimal activity.  No PND orthopnea.  She has noticed mild leg edema.  She is in acute decompensated heart failure.  I will start her back on furosemide 20 mg twice daily for the next 3 days and then 1 tablet once a day for a week.  Then she can use furosemide as and when she needs it for leg edema and also dyspnea.  Unless she has worsening symptoms, she can certainly call me at any time to be seen but otherwise she seems to be doing well, blood pressure is slightly elevated today but probably will improve with diuresis as well.  I will see her back as previously scheduled in 4 months.  With regard to aortic valve replacement, no change in murmur.  There is no JVD or hepatomegaly.  Patient is now being evaluated for elevated LFTs, she had markedly abnormal ALT and AST and also alkaline phosphatase.  She is now on steroids for the same and follows Dr. Wilford Corner.  Zetia has been on hold.  She is presently on 10 mg of Lipitor.    She is aware of endocarditis prophylaxis.     Adrian Prows, MD, [facility] 03/20/2020, 2:06 PM Office: [phone]   CC: Wilford Corner (GI) ; Kelton Pillar,  MD

## 2020-03-21 ENCOUNTER — Other Ambulatory Visit: Payer: Federal, State, Local not specified - PPO

## 2020-03-30 ENCOUNTER — Encounter: Payer: Self-pay | Admitting: *Deleted

## 2020-04-03 ENCOUNTER — Ambulatory Visit: Payer: Federal, State, Local not specified - PPO | Admitting: Cardiology

## 2020-04-25 ENCOUNTER — Telehealth: Payer: Self-pay

## 2020-04-25 ENCOUNTER — Telehealth: Payer: Self-pay | Admitting: Student

## 2020-04-25 DIAGNOSIS — J438 Other emphysema: Secondary | ICD-10-CM

## 2020-04-25 DIAGNOSIS — I5032 Chronic diastolic (congestive) heart failure: Secondary | ICD-10-CM

## 2020-04-25 NOTE — Telephone Encounter (Signed)
Spoke with patient and her daughter as they had called the office concerned that patient has been experiencing worsening shortness of breath as well as weight gain over the last 2 days.  Notes that baseline weight is approximately 113 pounds, however yesterday her weight in the morning was 114 pounds and by late afternoon her weight was 118 pounds.  Patient has been requiring furosemide 20 mg every other day for the last week, she typically takes this on an as-needed basis.  Also over the last 2 days patient has taken Lasix 20 mg once daily.  She reports dyspnea on exertion, fatigue, weight gain, and orthopnea.  Denies chest pain, palpitations, syncope, near syncope.  Recommend patient increase Lasix to 20 mg twice daily for 5 days, then reduce to Lasix 20 mg once daily.  We will plan to repeat BMP in 1 week.  Patient's daughter also requests referral to pulmonology as patient was recently diagnosed with emphysema which may be contributing to her dyspnea.  Patient's daughter also requesting referral for patient to nutritionist for guidance regarding low-sodium diet.  I have sent both these referrals.  We will schedule patient for an office follow-up in 2 weeks.  Chronic diastolic (congestive) heart failure ([place]) - Plan: Basic metabolic panel, Ambulatory referral to Nutrition and Diabetic Education  Other emphysema [facility]) - Plan: Ambulatory referral to [place], PA-C 04/25/2020, 2:53 PM Office: [phone]

## 2020-04-25 NOTE — Telephone Encounter (Signed)
Patient called stating that she has been having to take her lasix every other day and the last 2 days she has been doubling up. Yesterday after dinner her Resp rate was 74 and was SOB. Daughter listened to her and heard a cracking in her lungs. Yesterday was 114.8lb and then at dinner she was 118.8 with only a night gown on. Patient took 1/2 of a propanolol and a Lasix and she started peeing a hour after and she felt better. She is requesting a call back ASAP because her mother, patient, ios not feeling well.  Amy (patient daughter) [phone]

## 2020-04-27 NOTE — Telephone Encounter (Signed)
I have left voice message on mobile number to call back to schedule an appt with CC on 04/19 and it can be a 30 minute appt

## 2020-05-04 ENCOUNTER — Observation Stay (HOSPITAL_COMMUNITY)
Admission: EM | Admit: 2020-05-04 | Discharge: 2020-05-07 | Disposition: A | Discharge status: Home / Self Care | Payer: Federal, State, Local not specified - PPO | Attending: Emergency Medicine | Admitting: Emergency Medicine

## 2020-05-04 ENCOUNTER — Other Ambulatory Visit: Payer: Self-pay

## 2020-05-04 ENCOUNTER — Emergency Department (HOSPITAL_COMMUNITY): Payer: Federal, State, Local not specified - PPO

## 2020-05-04 ENCOUNTER — Encounter (HOSPITAL_COMMUNITY): Payer: Self-pay | Admitting: Emergency Medicine

## 2020-05-04 DIAGNOSIS — J9601 Acute respiratory failure with hypoxia: Principal | ICD-10-CM | POA: Insufficient documentation

## 2020-05-04 DIAGNOSIS — Z955 Presence of coronary angioplasty implant and graft: Secondary | ICD-10-CM | POA: Insufficient documentation

## 2020-05-04 DIAGNOSIS — R5383 Other fatigue: Secondary | ICD-10-CM

## 2020-05-04 DIAGNOSIS — E876 Hypokalemia: Secondary | ICD-10-CM | POA: Diagnosis not present

## 2020-05-04 DIAGNOSIS — I2510 Atherosclerotic heart disease of native coronary artery without angina pectoris: Secondary | ICD-10-CM | POA: Insufficient documentation

## 2020-05-04 DIAGNOSIS — R0602 Shortness of breath: Secondary | ICD-10-CM | POA: Diagnosis present

## 2020-05-04 DIAGNOSIS — I10 Essential (primary) hypertension: Secondary | ICD-10-CM | POA: Diagnosis not present

## 2020-05-04 DIAGNOSIS — Z79899 Other long term (current) drug therapy: Secondary | ICD-10-CM | POA: Diagnosis not present

## 2020-05-04 DIAGNOSIS — J45909 Unspecified asthma, uncomplicated: Secondary | ICD-10-CM | POA: Insufficient documentation

## 2020-05-04 DIAGNOSIS — Z20822 Contact with and (suspected) exposure to covid-19: Secondary | ICD-10-CM | POA: Insufficient documentation

## 2020-05-04 DIAGNOSIS — Z8673 Personal history of transient ischemic attack (TIA), and cerebral infarction without residual deficits: Secondary | ICD-10-CM | POA: Insufficient documentation

## 2020-05-04 DIAGNOSIS — R0600 Dyspnea, unspecified: Secondary | ICD-10-CM | POA: Diagnosis present

## 2020-05-04 DIAGNOSIS — R0609 Other forms of dyspnea: Secondary | ICD-10-CM | POA: Diagnosis present

## 2020-05-04 DIAGNOSIS — E039 Hypothyroidism, unspecified: Secondary | ICD-10-CM | POA: Insufficient documentation

## 2020-05-04 DIAGNOSIS — D649 Anemia, unspecified: Secondary | ICD-10-CM

## 2020-05-04 LAB — URINALYSIS, ROUTINE W REFLEX MICROSCOPIC
Bilirubin Urine: NEGATIVE
Glucose, UA: NEGATIVE mg/dL
Hgb urine dipstick: NEGATIVE
Ketones, ur: NEGATIVE mg/dL
Leukocytes,Ua: NEGATIVE
Nitrite: NEGATIVE
Protein, ur: NEGATIVE mg/dL
Specific Gravity, Urine: 1.005 (ref 1.005–1.030)
pH: 6 (ref 5.0–8.0)

## 2020-05-04 LAB — COMPREHENSIVE METABOLIC PANEL
ALT: 30 U/L (ref 0–44)
AST: 41 U/L (ref 15–41)
Albumin: 3.1 g/dL — ABNORMAL LOW (ref 3.5–5.0)
Alkaline Phosphatase: 229 U/L — ABNORMAL HIGH (ref 38–126)
Anion gap: 10 (ref 5–15)
BUN: 30 mg/dL — ABNORMAL HIGH (ref 8–23)
CO2: 30 mmol/L (ref 22–32)
Calcium: 8.5 mg/dL — ABNORMAL LOW (ref 8.9–10.3)
Chloride: 87 mmol/L — ABNORMAL LOW (ref 98–111)
Creatinine, Ser: 1.53 mg/dL — ABNORMAL HIGH (ref 0.44–1.00)
GFR, Estimated: 33 mL/min — ABNORMAL LOW (ref >60)
Glucose, Bld: 116 mg/dL — ABNORMAL HIGH (ref 70–99)
Potassium: 2.9 mmol/L — ABNORMAL LOW (ref 3.5–5.1)
Sodium: 127 mmol/L — ABNORMAL LOW (ref 135–145)
Total Bilirubin: 0.5 mg/dL (ref 0.3–1.2)
Total Protein: 6.5 g/dL (ref 6.5–8.1)

## 2020-05-04 LAB — CBC WITH DIFFERENTIAL/PLATELET
Abs Immature Granulocytes: 0.24 10*3/uL — ABNORMAL HIGH (ref 0.00–0.07)
Basophils Absolute: 0.1 10*3/uL (ref 0.0–0.1)
Basophils Relative: 1 %
Eosinophils Absolute: 0.2 10*3/uL (ref 0.0–0.5)
Eosinophils Relative: 2 %
HCT: 29.2 % — ABNORMAL LOW (ref 36.0–46.0)
Hemoglobin: 9.1 g/dL — ABNORMAL LOW (ref 12.0–15.0)
Immature Granulocytes: 2 %
Lymphocytes Relative: 31 %
Lymphs Abs: 3.1 10*3/uL (ref 0.7–4.0)
MCH: 27.1 pg (ref 26.0–34.0)
MCHC: 31.2 g/dL (ref 30.0–36.0)
MCV: 86.9 fL (ref 80.0–100.0)
Monocytes Absolute: 1 10*3/uL (ref 0.1–1.0)
Monocytes Relative: 10 %
Neutro Abs: 5.4 10*3/uL (ref 1.7–7.7)
Neutrophils Relative %: 54 %
Platelets: 361 10*3/uL (ref 150–400)
RBC: 3.36 MIL/uL — ABNORMAL LOW (ref 3.87–5.11)
RDW: 14.5 % (ref 11.5–15.5)
WBC: 9.9 10*3/uL (ref 4.0–10.5)
nRBC: 0 % (ref 0.0–0.2)

## 2020-05-04 LAB — TROPONIN I (HIGH SENSITIVITY)
Troponin I (High Sensitivity): 16 ng/L (ref <18)
Troponin I (High Sensitivity): 15 ng/L (ref <18)

## 2020-05-04 LAB — D-DIMER, QUANTITATIVE: D-Dimer, Quant: 1.47 ug/mL-FEU — ABNORMAL HIGH (ref 0.00–0.50)

## 2020-05-04 LAB — BRAIN NATRIURETIC PEPTIDE: B Natriuretic Peptide: 94.1 pg/mL (ref 0.0–100.0)

## 2020-05-04 MED ORDER — SODIUM CHLORIDE 0.9 % IV BOLUS (SEPSIS)
500.0000 mL | Freq: Once | INTRAVENOUS | Status: AC
  Administered 2020-05-04: 500 mL via INTRAVENOUS

## 2020-05-04 MED ORDER — SODIUM CHLORIDE 0.9 % IV SOLN
1000.0000 mL | INTRAVENOUS | Status: DC
  Administered 2020-05-04 – 2020-05-06 (×5): 1000 mL via INTRAVENOUS

## 2020-05-04 MED ORDER — POTASSIUM CHLORIDE 10 MEQ/100ML IV SOLN
10.0000 meq | Freq: Once | INTRAVENOUS | Status: AC
  Administered 2020-05-04: 10 meq via INTRAVENOUS
  Filled 2020-05-04: qty 100

## 2020-05-04 MED ORDER — POTASSIUM CHLORIDE CRYS ER 20 MEQ PO TBCR
40.0000 meq | EXTENDED_RELEASE_TABLET | Freq: Once | ORAL | Status: AC
  Administered 2020-05-04: 40 meq via ORAL
  Filled 2020-05-04: qty 2

## 2020-05-04 NOTE — ED Notes (Signed)
ED Provider at bedside. 

## 2020-05-04 NOTE — ED Notes (Signed)
md at bedside updating pt

## 2020-05-04 NOTE — ED Provider Notes (Signed)
[facility] Provider Note   CSN: [id number] Arrival date & time: 05/04/20  1707     History Chief Complaint  Patient presents with  . Shortness of Breath    [name] [name] is a 87 y.o. female.  HPI   Pt states the past couple of weeks she has been getting more short of breath.  She has been losing weight.  She has been feeling very fatigued.  It gets worse when she tries to walk around. She has seen Dr Einar Gip and has mitral regurg and CHF.   Daughter has listened to her with a stethoscope and she has heard abnormal breath sounds.  Pt had a low grade temp and chills recently and took home covid tests and they were negative. She had been on higher doses of lasix but has tapered off recently.  She has noticed some leg swelling.   She has been seeing a gi doctor for liver issues.    Pt states she has called her doctor has called them but has not seen them in person.  Past Medical History:  Diagnosis Date  . Arthritis   . Asthma    history of  . CAD (coronary artery disease)   . Dyspnea    sometimes sitting.lying and on exertion  . GERD (gastroesophageal reflux disease)   . History of hiatal hernia   . History of transcatheter aortic valve replacement (TAVR) 01/26/2019   (TAVR  23 mm Evolute Pro in aortic position 01/26/2019) .   Marland Kitchen Hyperlipidemia   . Hypertension   . Hypothyroidism   . Lipoma    left arm  . Nasal polyps   . Pneumonia   . Pulmonary nodule    noted on pre TAVR CT  . Severe aortic stenosis     Patient Active Problem List   Diagnosis Date Noted  . History of transcatheter aortic valve replacement (TAVR) 01/26/2019  . CAD (coronary artery disease), native coronary artery 01/05/2019  . Severe aortic stenosis 01/05/2019  . Post PTCA 01/05/2019  . Dyspnea on exertion 12/14/2018  . Nasal polyposis 03/31/2018  . Osteoarthritis of knee 07/28/2017  . Pain in right knee 06/12/2017  . Lumbar radiculopathy 06/03/2017  . Pain  in lower limb 05/29/2017  . Lumbar spondylosis 05/12/2017  . Chronic maxillary sinusitis 12/20/2015  . Upper airway cough syndrome 11/21/2015  . Hyperlipidemia 09/25/2013  . Allergic rhinitis, cause unspecified 09/15/2013  . Unspecified constipation 09/15/2013  . Fracture dislocation of ankle joint 09/11/2013  . Bimalleolar ankle fracture 09/11/2013  . TIA (transient ischemic attack) 06/24/2012  . HTN (hypertension) 06/24/2012  . Radicular pain in left arm 03/12/2011    Past Surgical History:  Procedure Laterality Date  . CHOLECYSTECTOMY  02/27/04  . CORONARY ATHERECTOMY N/A 01/05/2019   Procedure: CORONARY ATHERECTOMY;  Surgeon: Adrian Prows, MD;  Location: [facility];  Service: Cardiovascular;  Laterality: N/A;  . CORONARY BALLOON ANGIOPLASTY N/A 01/05/2019   Procedure: CORONARY BALLOON ANGIOPLASTY;  Surgeon: Adrian Prows, MD;  Location: [facility];  Service: Cardiovascular;  Laterality: N/A;  . CORONARY STENT INTERVENTION N/A 01/05/2019   Procedure: CORONARY STENT INTERVENTION;  Surgeon: Adrian Prows, MD;  Location: [facility];  Service: Cardiovascular;  Laterality: N/A;  . CORONARY STENT INTERVENTION  01/05/2019  . INCONTINENCE SURGERY  09/12/09  . NASAL SINUS SURGERY     several    . ORIF ANKLE FRACTURE Right 09/11/2013   Procedure: OPEN REDUCTION INTERNAL FIXATION (ORIF)  ANKLE FRACTURE;  Surgeon: Augustin Schooling, MD;  Location: [facility];  Service: Orthopedics;  Laterality: Right;  . RIGHT/LEFT HEART CATH AND CORONARY ANGIOGRAPHY N/A 12/15/2018   Procedure: RIGHT/LEFT HEART CATH AND CORONARY ANGIOGRAPHY;  Surgeon: Adrian Prows, MD;  Location: [facility];  Service: Cardiovascular;  Laterality: N/A;  . ROTATOR CUFF REPAIR  09/27/2003  . TRANSCATHETER AORTIC VALVE REPLACEMENT, TRANSFEMORAL  01/26/2019  . TRANSCATHETER AORTIC VALVE REPLACEMENT, TRANSFEMORAL N/A 01/26/2019   Procedure: TRANSCATHETER AORTIC VALVE REPLACEMENT, TRANSFEMORAL;  Surgeon: Burnell Blanks, MD;  Location: [facility];  Service: Open Heart Surgery;  Laterality: N/A;  . TRIGGER FINGER RELEASE  10/29/06     OB History   No obstetric history on file.     Family History  Problem Relation Age of Onset  . Tuberculosis Mother   . Other Father        Bronchiectasis  . Cancer Maternal Grandmother        ovarian  . Sarcoidosis Sister   . Asthma Maternal Grandfather   . Heart disease Brother   . Heart attack Brother   . Breast cancer Daughter     Social History   Tobacco Use  . Smoking status: Never Smoker  . Smokeless tobacco: Never Used  Vaping Use  . Vaping Use: Never used  Substance Use Topics  . Alcohol use: No  . Drug use: No    Home Medications Prior to Admission medications   Medication Sig Start Date End Date Taking? Authorizing Provider  atorvastatin (LIPITOR) 10 MG tablet Take 1 tablet (10 mg total) by mouth daily. 10/04/19  Yes Adrian Prows, MD  calcium-vitamin D (OSCAL WITH D) 250-125 MG-UNIT tablet Take 1 tablet by mouth daily.   Yes [provider]  cetirizine (ZYRTEC) 10 MG tablet Take 10 mg by mouth daily.   Yes [provider]  cholecalciferol (VITAMIN D) 25 MCG (1000 UT) tablet Take 1,000 Units by mouth daily.   Yes [provider]  clopidogrel (PLAVIX) 75 MG tablet TAKE 1 TABLET BY MOUTH EVERY DAY Patient taking differently: Take 75 mg by mouth daily. 11/29/19  Yes Adrian Prows, MD  furosemide (LASIX) 20 MG tablet Take 1 tablet (20 mg total) by mouth 2 (two) times daily as needed for fluid (am and 3 pm for 3 days then as needed for shortness of breath). Patient taking differently: Take 20 mg by mouth daily. 03/20/20  Yes Adrian Prows, MD  levothyroxine (SYNTHROID) 50 MCG tablet Take 50 mcg by mouth daily before breakfast.   Yes [provider]  meclizine (ANTIVERT) 25 MG tablet Take 25 mg by mouth 3 (three) times daily as needed for dizziness.    Yes [provider]  mometasone (NASONEX) 50 MCG/ACT nasal  spray Place 2 sprays into the nose daily. 07/28/19  Yes [provider]  Multiple Vitamin (MULTIVITAMIN WITH MINERALS) TABS tablet Take 1 tablet by mouth daily.   Yes [provider]  Polyethyl Glycol-Propyl Glycol (SYSTANE) 0.4-0.3 % SOLN Place 1 drop into both eyes 3 (three) times daily as needed (dry/irritated eyes.).   Yes [provider]  propranolol (INDERAL) 40 MG tablet Take 20 mg by mouth 3 (three) times daily.   Yes [provider]  amoxicillin (AMOXIL) 500 MG capsule Take 500 mg by mouth as needed. Take medication only before dental procedures. 09/28/19   [provider]  ezetimibe (ZETIA) 10 MG tablet Take 10 mg by mouth daily.  Patient not taking: No sig  reported    [provider]    Allergies    Aspirin, Bee venom, Ceclor [cefaclor], Pneumococcal vac polyvalent, Statins, Penicillins, and Sulfa antibiotics  Review of Systems   Review of Systems  All other systems reviewed and are negative.   Physical Exam Updated Vital Signs BP 108/66 (BP Location: Right Arm)   Pulse 88   Temp 97.8 F (36.6 C) (Oral)   Resp 16   SpO2 96%   Physical Exam Vitals and nursing note reviewed.  Constitutional:      General: She is not in acute distress.    Appearance: She is well-developed.  HENT:     Head: Normocephalic and atraumatic.     Right Ear: External ear normal.     Left Ear: External ear normal.  Eyes:     General: No scleral icterus.       Right eye: No discharge.        Left eye: No discharge.     Conjunctiva/sclera: Conjunctivae normal.  Neck:     Trachea: No tracheal deviation.  Cardiovascular:     Rate and Rhythm: Normal rate and regular rhythm.  Pulmonary:     Effort: Pulmonary effort is normal. No respiratory distress.     Breath sounds: Normal breath sounds. No stridor. No wheezing or rales.  Abdominal:     General: Bowel sounds are normal. There is no distension.     Palpations: Abdomen is soft.      Tenderness: There is no abdominal tenderness. There is no guarding or rebound.  Musculoskeletal:        General: No tenderness.     Cervical back: Neck supple.     Right lower leg: No edema.     Left lower leg: No edema.  Skin:    General: Skin is warm and dry.     Findings: No rash.  Neurological:     Mental Status: She is alert.     Cranial Nerves: No cranial nerve deficit (no facial droop, extraocular movements intact, no slurred speech).     Sensory: No sensory deficit.     Motor: No abnormal muscle tone or seizure activity.     Coordination: Coordination normal.     ED Results / Procedures / Treatments   Labs (all labs ordered are listed, but only abnormal results are displayed) Labs Reviewed  CBC WITH DIFFERENTIAL/PLATELET - Abnormal; Notable for the following components:      Result Value   RBC 3.36 (*)    Hemoglobin 9.1 (*)    HCT 29.2 (*)    Abs Immature Granulocytes 0.24 (*)    All other components within normal limits  COMPREHENSIVE METABOLIC PANEL - Abnormal; Notable for the following components:   Sodium 127 (*)    Potassium 2.9 (*)    Chloride 87 (*)    Glucose, Bld 116 (*)    BUN 30 (*)    Creatinine, Ser 1.53 (*)    Calcium 8.5 (*)    Albumin 3.1 (*)    Alkaline Phosphatase 229 (*)    GFR, Estimated 33 (*)    All other components within normal limits  D-DIMER, QUANTITATIVE - Abnormal; Notable for the following components:   D-Dimer, Quant 1.47 (*)    All other components within normal limits  URINALYSIS, ROUTINE W REFLEX MICROSCOPIC - Abnormal; Notable for the following components:   Color, Urine STRAW (*)    All other components within normal limits  BRAIN NATRIURETIC PEPTIDE  TROPONIN I (  HIGH SENSITIVITY)  TROPONIN I (HIGH SENSITIVITY)    EKG EKG Interpretation  Date/Time:  Thursday May 04 2020 17:32:05 EDT Ventricular Rate:  78 PR Interval:  142 QRS Duration: 114 QT Interval:  404 QTC Calculation: 460 R Axis:   -63 Text  Interpretation: Normal sinus rhythm Left axis deviation Minimal voltage criteria for LVH, may be normal variant ( Cornell product ) Anterior infarct , age undetermined Abnormal ECG lateral st changes resolved since last tracing Confirmed by Dorie Rank [phone]) on 05/04/2020 5:35:23 PM   Radiology DG Chest 2 View  Result Date: 05/04/2020 CLINICAL DATA:  Short of breath EXAM: CHEST - 2 VIEW COMPARISON:  None 521 FINDINGS: Frontal and lateral views of the chest demonstrate stable aortic valve prosthesis. The cardiac silhouette is unremarkable. No acute airspace disease, effusion, or pneumothorax. Mild chronic interstitial scarring. Stable hiatal hernia. IMPRESSION: 1. No acute intrathoracic process. 2. Stable hiatal hernia. 3. Stable interstitial scarring. Electronically Signed   By: Randa Ngo M.D.   On: 05/04/2020 18:48    Procedures Procedures   Medications Ordered in ED Medications  sodium chloride 0.9 % bolus 500 mL (0 mLs Intravenous Stopped 05/04/20 2242)    Followed by  0.9 %  sodium chloride infusion (1,000 mLs Intravenous New Bag/Given 05/04/20 2127)  potassium chloride 10 mEq in 100 mL IVPB (0 mEq Intravenous Stopped 05/04/20 2236)  potassium chloride SA (KLOR-CON) CR tablet 40 mEq (40 mEq Oral Given 05/04/20 2242)    ED Course  I have reviewed the triage vital signs and the nursing notes.  Pertinent labs & imaging results that were available during my care of the patient were reviewed by me and considered in my medical decision making (see chart for details).  Clinical Course as of 05/04/20 2340  Thu May 04, 2020  2057 UA negative. [JK]  2057 Delta top normal. [JK]  2305 Daughter asked to me to listen to abdomen.  Pt does have loud borboryrgmi now although somewhat unusual that it seems to be more rhythmic.  Abdomen nontender. [JK]  2307 Prior CT scan in December did show rather large hiatal hernia [JK]    Clinical Course User Index [JK] Dorie Rank, MD   MDM  Rules/Calculators/A&P                          Patient presented with progressive fatigue and shortness of breath.  No signs of acute CHF or cardiac ischemia.  Chest x-ray does not show evidence of pneumonia.  Urinalysis without signs of infection.  Anemia is stable and not worsening.  No signs of acute GI bleeding.  Electrolytes do show signs of hypokalemia hypochloremia and hyponatremia.  Patient also does have slight increase in BUN and creatinine.  This might be related to her recent diuretic use.  Patient was given a small IV fluid bolus.  D-dimer is elevated so VQ scan has been ordered.  Patient has been given potassium and IV fluids.  If her VQ scan is negative I think she is safe to follow-up with her cardiologist and primary care doctor as an outpatient.  Care turned over to oncoming MD Final Clinical Impression(s) / ED Diagnoses Final diagnoses:  Hypokalemia  Anemia, unspecified type  Other fatigue    Rx / DC Orders ED Discharge Orders    None       Dorie Rank, MD 05/04/20 2340

## 2020-05-04 NOTE — ED Triage Notes (Addendum)
Pt c/o fatigue, decreased appetite, and shortness of breath, symptoms have been present for a few months and have been increasing recently. Hx cirrhosis, c/o fluid retention, currently taking lasix, but the dosage has been decreased. Pt also reports a fever and chill on Monday, neg covid x 2 this week.

## 2020-05-04 NOTE — ED Triage Notes (Signed)
Emergency Medicine Provider Triage Evaluation Note  [name] [name] , a 87 y.o. female  was evaluated in triage.  Pt complains of fatigue, shortness of breath which is been ongoing for quite some time but worsening over the last couple days.  Review of Systems  Positive: Chest pain, fatigue back pain Negative: , Chills, nausea, vomiting, abdominal pain  Physical Exam  BP 114/66 (BP Location: Left Arm)   Pulse 77   Temp 97.8 F (36.6 C) (Oral)   Resp 16   SpO2 100%  Gen:   Awake, no distress   HEENT:  Atraumatic  Resp:  Normal effort  Cardiac:  Normal rate  Abd:   Nondistended, nontender  MSK:   Moves extremities without difficulty  Neuro:  Speech clear   Medical Decision Making  Medically screening exam initiated at 5:19 PM.  Appropriate orders placed.  [name] was informed that the remainder of the evaluation will be completed by another provider, this initial triage assessment does not replace that evaluation, and the importance of remaining in the ED until their evaluation is complete.  Clinical Impression  87 year old female with shortness of breath, fatigue.  Plan for basic labs, BNP, chest x-ray, EKG.  Stable for further evaluation.   Garald Balding, PA-C 05/04/20 2154

## 2020-05-05 ENCOUNTER — Observation Stay (HOSPITAL_COMMUNITY): Payer: Federal, State, Local not specified - PPO

## 2020-05-05 ENCOUNTER — Emergency Department (HOSPITAL_COMMUNITY): Payer: Federal, State, Local not specified - PPO

## 2020-05-05 DIAGNOSIS — I10 Essential (primary) hypertension: Secondary | ICD-10-CM

## 2020-05-05 DIAGNOSIS — D649 Anemia, unspecified: Secondary | ICD-10-CM | POA: Diagnosis not present

## 2020-05-05 DIAGNOSIS — J9601 Acute respiratory failure with hypoxia: Secondary | ICD-10-CM | POA: Diagnosis not present

## 2020-05-05 DIAGNOSIS — E876 Hypokalemia: Secondary | ICD-10-CM

## 2020-05-05 DIAGNOSIS — R0600 Dyspnea, unspecified: Secondary | ICD-10-CM | POA: Diagnosis not present

## 2020-05-05 LAB — BASIC METABOLIC PANEL
Anion gap: 4 — ABNORMAL LOW (ref 5–15)
BUN: 21 mg/dL (ref 8–23)
CO2: 29 mmol/L (ref 22–32)
Calcium: 7.6 mg/dL — ABNORMAL LOW (ref 8.9–10.3)
Chloride: 101 mmol/L (ref 98–111)
Creatinine, Ser: 1.32 mg/dL — ABNORMAL HIGH (ref 0.44–1.00)
GFR, Estimated: 39 mL/min — ABNORMAL LOW (ref >60)
Glucose, Bld: 97 mg/dL (ref 70–99)
Potassium: 3.7 mmol/L (ref 3.5–5.1)
Sodium: 134 mmol/L — ABNORMAL LOW (ref 135–145)

## 2020-05-05 LAB — RESP PANEL BY RT-PCR (FLU A&B, COVID) ARPGX2
Influenza A by PCR: NEGATIVE
Influenza B by PCR: NEGATIVE
SARS Coronavirus 2 by RT PCR: NEGATIVE

## 2020-05-05 LAB — GLUCOSE, CAPILLARY: Glucose-Capillary: 140 mg/dL — ABNORMAL HIGH (ref 70–99)

## 2020-05-05 MED ORDER — CALCIUM CARBONATE-VITAMIN D 500-200 MG-UNIT PO TABS
0.5000 | ORAL_TABLET | Freq: Every day | ORAL | Status: DC
  Administered 2020-05-05 – 2020-05-07 (×3): 0.5 via ORAL
  Filled 2020-05-05: qty 0.5
  Filled 2020-05-05 (×2): qty 1

## 2020-05-05 MED ORDER — ACETAMINOPHEN 650 MG RE SUPP: 650.0000 mg | Freq: Four times a day (QID) | RECTAL | Status: DC | PRN

## 2020-05-05 MED ORDER — ONDANSETRON HCL 4 MG PO TABS: 4.0000 mg | ORAL_TABLET | Freq: Four times a day (QID) | ORAL | Status: DC | PRN

## 2020-05-05 MED ORDER — PROPRANOLOL HCL 40 MG PO TABS
20.0000 mg | ORAL_TABLET | Freq: Three times a day (TID) | ORAL | Status: DC
  Administered 2020-05-05 – 2020-05-07 (×7): 20 mg via ORAL
  Filled 2020-05-05 (×9): qty 1

## 2020-05-05 MED ORDER — ATORVASTATIN CALCIUM 10 MG PO TABS
10.0000 mg | ORAL_TABLET | Freq: Every day | ORAL | Status: DC
  Administered 2020-05-05 – 2020-05-07 (×3): 10 mg via ORAL
  Filled 2020-05-05 (×3): qty 1

## 2020-05-05 MED ORDER — LORATADINE 10 MG PO TABS
10.0000 mg | ORAL_TABLET | Freq: Every day | ORAL | Status: DC
  Administered 2020-05-05 – 2020-05-07 (×3): 10 mg via ORAL
  Filled 2020-05-05 (×3): qty 1

## 2020-05-05 MED ORDER — ADULT MULTIVITAMIN W/MINERALS CH
1.0000 | ORAL_TABLET | Freq: Every day | ORAL | Status: DC
  Administered 2020-05-05 – 2020-05-07 (×3): 1 via ORAL
  Filled 2020-05-05 (×3): qty 1

## 2020-05-05 MED ORDER — MECLIZINE HCL 25 MG PO TABS: 25.0000 mg | ORAL_TABLET | Freq: Three times a day (TID) | ORAL | Status: DC | PRN

## 2020-05-05 MED ORDER — ONDANSETRON HCL 4 MG/2ML IJ SOLN: 4.0000 mg | Freq: Four times a day (QID) | INTRAMUSCULAR | Status: DC | PRN

## 2020-05-05 MED ORDER — EZETIMIBE 10 MG PO TABS
10.0000 mg | ORAL_TABLET | Freq: Every day | ORAL | Status: DC
  Administered 2020-05-06: 10 mg via ORAL
  Filled 2020-05-05 (×3): qty 1

## 2020-05-05 MED ORDER — POLYVINYL ALCOHOL 1.4 % OP SOLN: 1.0000 [drp] | Freq: Three times a day (TID) | OPHTHALMIC | Status: DC | PRN

## 2020-05-05 MED ORDER — FLUTICASONE PROPIONATE 50 MCG/ACT NA SUSP
2.0000 | Freq: Every day | NASAL | Status: DC
  Administered 2020-05-06 – 2020-05-07 (×2): 2 via NASAL
  Filled 2020-05-05: qty 16

## 2020-05-05 MED ORDER — ENOXAPARIN SODIUM 40 MG/0.4ML ~~LOC~~ SOLN
40.0000 mg | SUBCUTANEOUS | Status: DC
  Administered 2020-05-05 – 2020-05-07 (×3): 40 mg via SUBCUTANEOUS
  Filled 2020-05-05 (×2): qty 0.4

## 2020-05-05 MED ORDER — VITAMIN D 25 MCG (1000 UNIT) PO TABS
1000.0000 [IU] | ORAL_TABLET | Freq: Every day | ORAL | Status: DC
  Administered 2020-05-05 – 2020-05-07 (×3): 1000 [IU] via ORAL
  Filled 2020-05-05 (×3): qty 1

## 2020-05-05 MED ORDER — LEVOTHYROXINE SODIUM 50 MCG PO TABS
50.0000 ug | ORAL_TABLET | Freq: Every day | ORAL | Status: DC
  Administered 2020-05-05 – 2020-05-07 (×3): 50 ug via ORAL
  Filled 2020-05-05 (×3): qty 1

## 2020-05-05 MED ORDER — ACETAMINOPHEN 325 MG PO TABS
650.0000 mg | ORAL_TABLET | Freq: Four times a day (QID) | ORAL | Status: DC | PRN
  Filled 2020-05-05: qty 2

## 2020-05-05 MED ORDER — CLOPIDOGREL BISULFATE 75 MG PO TABS
75.0000 mg | ORAL_TABLET | Freq: Every day | ORAL | Status: DC
  Administered 2020-05-05 – 2020-05-07 (×3): 75 mg via ORAL
  Filled 2020-05-05 (×3): qty 1

## 2020-05-05 MED ORDER — TECHNETIUM TO 99M ALBUMIN AGGREGATED
4.1000 | Freq: Once | INTRAVENOUS | Status: AC | PRN
  Administered 2020-05-05: 4.1 via INTRAVENOUS

## 2020-05-05 NOTE — Progress Notes (Signed)
Limited 2D echocardiogram completed.  05/05/2020 3:00 PM Kelby Aline., MHA, RVT, RDCS, RDMS

## 2020-05-05 NOTE — ED Notes (Signed)
Patient remains on continuous cardiac and 02 sat monitoring, A/A/O, no distress. Denies pain/SOB.

## 2020-05-05 NOTE — ED Notes (Addendum)
Pt passes fluid challenge w/o difficulty, when ambulating on room air pts Spo2 dropped to 80%, pt tachypnic but as soon as she rested by into stretcher pt spo2 increased to 100% on room air.

## 2020-05-05 NOTE — ED Notes (Signed)
Patient ambulated to BR and back to bed unassisted, maintained 02 sat 99% but verbalized increased SOB on exertion

## 2020-05-05 NOTE — H&P (Signed)
History and Physical    [name] [name] [id number] DOB: 01-23-1931 DOA: 05/04/2020  PCP: Kelton Pillar, MD  Patient coming from: Home  I have personally briefly reviewed patient's old medical records in [place]  Chief Complaint: SOB  HPI: [name] [name] is a 87 y.o. female with medical history significant of CAD s/p stent, AVS s/p TAVR in Jan, HTN, HLD, pulmonary nodules that appeared benign on most recent CT (dec 21) without change, large hiatal hernia.  No formal diagnosis of COPD though they did see emphysema findings in lungs on that Dec Ct scan.  Pt presents to the ED with c/o increasing SOB over the past few weeks.  Associated fatigue.  SOB is worse with exertion, better at rest.  There is NO associated orthopnea (she is laying flat right now and having no SOB).  Low grade temp and chills recently, neg home COVID test.  Was on higher doses of lasix ('20mg'$  daily) a couple of weeks ago but this tapered down to '10mg'$  daily.  No increase in peripheral edema.  Wt has remained stable she tells me (no gain nor loss) in contrast to what EDP was told.  No abd pain.   ED Course: Pt is actually hypoxic with ambulation, dropping to the upper 80s on RA.  Labs otherwise are suggestive of contraction alkalosis with sodium 127, K 2.9, BUN 30, bicarb 30, creat 1.5, and Cl of 87.  VQ scan neg for PE.  CXR neg for acute findings.  Pt given IVF, K replaced, and hospitalist asked to admit.   Review of Systems: As per HPI, otherwise all review of systems negative.  Past Medical History:  Diagnosis Date  . Arthritis   . Asthma    history of  . CAD (coronary artery disease)   . Dyspnea    sometimes sitting.lying and on exertion  . GERD (gastroesophageal reflux disease)   . History of hiatal hernia   . History of transcatheter aortic valve replacement (TAVR) 01/26/2019   (TAVR  23 mm Evolute Pro in aortic position 01/26/2019) .   Marland Kitchen Hyperlipidemia   . Hypertension   .  Hypothyroidism   . Lipoma    left arm  . Nasal polyps   . Pneumonia   . Pulmonary nodule    noted on pre TAVR CT  . Severe aortic stenosis     Past Surgical History:  Procedure Laterality Date  . CHOLECYSTECTOMY  02/27/04  . CORONARY ATHERECTOMY N/A 01/05/2019   Procedure: CORONARY ATHERECTOMY;  Surgeon: Adrian Prows, MD;  Location: [facility];  Service: Cardiovascular;  Laterality: N/A;  . CORONARY BALLOON ANGIOPLASTY N/A 01/05/2019   Procedure: CORONARY BALLOON ANGIOPLASTY;  Surgeon: Adrian Prows, MD;  Location: [facility];  Service: Cardiovascular;  Laterality: N/A;  . CORONARY STENT INTERVENTION N/A 01/05/2019   Procedure: CORONARY STENT INTERVENTION;  Surgeon: Adrian Prows, MD;  Location: [facility];  Service: Cardiovascular;  Laterality: N/A;  . CORONARY STENT INTERVENTION  01/05/2019  . INCONTINENCE SURGERY  09/12/09  . NASAL SINUS SURGERY     several    . ORIF ANKLE FRACTURE Right 09/11/2013   Procedure: OPEN REDUCTION INTERNAL FIXATION (ORIF) ANKLE FRACTURE;  Surgeon: Augustin Schooling, MD;  Location: [facility];  Service: Orthopedics;  Laterality: Right;  . RIGHT/LEFT HEART CATH AND CORONARY ANGIOGRAPHY N/A 12/15/2018   Procedure: RIGHT/LEFT HEART CATH AND CORONARY ANGIOGRAPHY;  Surgeon: Adrian Prows, MD;  Location: [facility];  Service: Cardiovascular;  Laterality: N/A;  . ROTATOR CUFF REPAIR  09/27/2003  . TRANSCATHETER AORTIC VALVE REPLACEMENT, TRANSFEMORAL  01/26/2019  . TRANSCATHETER AORTIC VALVE REPLACEMENT, TRANSFEMORAL N/A 01/26/2019   Procedure: TRANSCATHETER AORTIC VALVE REPLACEMENT, TRANSFEMORAL;  Surgeon: Burnell Blanks, MD;  Location: [facility];  Service: Open Heart Surgery;  Laterality: N/A;  . TRIGGER FINGER RELEASE  10/29/06     reports that she has never smoked. She has never used smokeless tobacco. She reports that she does not drink alcohol and does not use drugs.  Allergies  Allergen Reactions  . Aspirin Anaphylaxis  . Bee Venom  Anaphylaxis  . Ceclor [Cefaclor] Anaphylaxis  . Pneumococcal Vac Polyvalent     Other reaction(s): swelling and erythema  . Statins Other (See Comments)    Elevated liver enzymes  . Penicillins Rash    Has patient had a PCN reaction causing immediate rash, facial/tongue/throat swelling, SOB or lightheadedness with hypotension: Yes Has patient had a PCN reaction causing severe rash involving mucus membranes or skin necrosis: No Has patient had a PCN reaction that required hospitalization No Has patient had a PCN reaction occurring within the last 10 years: No If all of the above answers are "NO", then may proceed with Cephalosporin use.  . Sulfa Antibiotics Rash    Family History  Problem Relation Age of Onset  . Tuberculosis Mother   . Other Father        Bronchiectasis  . Cancer Maternal Grandmother        ovarian  . Sarcoidosis Sister   . Asthma Maternal Grandfather   . Heart disease Brother   . Heart attack Brother   . Breast cancer Daughter      Prior to Admission medications   Medication Sig Start Date End Date Taking? Authorizing Provider  atorvastatin (LIPITOR) 10 MG tablet Take 1 tablet (10 mg total) by mouth daily. 10/04/19  Yes Adrian Prows, MD  calcium-vitamin D (OSCAL WITH D) 250-125 MG-UNIT tablet Take 1 tablet by mouth daily.   Yes [provider]  cetirizine (ZYRTEC) 10 MG tablet Take 10 mg by mouth daily.   Yes [provider]  cholecalciferol (VITAMIN D) 25 MCG (1000 UT) tablet Take 1,000 Units by mouth daily.   Yes [provider]  clopidogrel (PLAVIX) 75 MG tablet TAKE 1 TABLET BY MOUTH EVERY DAY Patient taking differently: Take 75 mg by mouth daily. 11/29/19  Yes Adrian Prows, MD  furosemide (LASIX) 20 MG tablet Take 1 tablet (20 mg total) by mouth 2 (two) times daily as needed for fluid (am and 3 pm for 3 days then as needed for shortness of breath). Patient taking differently: Take 20 mg by mouth daily. 03/20/20  Yes Adrian Prows, MD   levothyroxine (SYNTHROID) 50 MCG tablet Take 50 mcg by mouth daily before breakfast.   Yes [provider]  meclizine (ANTIVERT) 25 MG tablet Take 25 mg by mouth 3 (three) times daily as needed for dizziness.    Yes [provider]  mometasone (NASONEX) 50 MCG/ACT nasal spray Place 2 sprays into the nose daily. 07/28/19  Yes [provider]  Multiple Vitamin (MULTIVITAMIN WITH MINERALS) TABS tablet Take 1 tablet by mouth daily.   Yes [provider]  Polyethyl Glycol-Propyl Glycol (SYSTANE) 0.4-0.3 % SOLN Place 1 drop into both eyes 3 (three) times daily as needed (dry/irritated eyes.).   Yes [provider]  propranolol (INDERAL) 40 MG tablet Take 20 mg by mouth 3 (three) times daily.   Yes [provider]  amoxicillin (AMOXIL) 500 MG capsule Take 500 mg by mouth as needed. Take medication only before dental procedures. 09/28/19   [provider]  ezetimibe (ZETIA) 10 MG tablet Take 10 mg by mouth daily.  Patient not taking: No sig reported    [provider]    Physical Exam: Vitals:   05/05/20 0315 05/05/20 0330 05/05/20 0345 05/05/20 0400  BP: (!) 86/59 (!) 99/53 (!) 95/55 (!) 102/54  Pulse: 80 83 83 86  Resp: '13 15 15 16  '$ Temp:      TempSrc:      SpO2: 97% 97% 96% 97%    Constitutional: NAD, calm, comfortable Eyes: PERRL, lids and conjunctivae normal ENMT: Mucous membranes are moist. Posterior pharynx clear of any exudate or lesions.Normal dentition.  Neck: normal, supple, no masses, no thyromegaly Respiratory: clear to auscultation bilaterally, no wheezing, no crackles. Normal respiratory effort. No accessory muscle use.  Cardiovascular: Regular rate and rhythm, no murmurs / rubs / gallops. No extremity edema. 2+ pedal pulses. No carotid bruits.  Abdomen: no tenderness, no masses palpated. No hepatosplenomegaly. Bowel sounds positive.  Musculoskeletal: no clubbing / cyanosis. No joint deformity upper and lower  extremities. Good ROM, no contractures. Normal muscle tone.  Skin: no rashes, lesions, ulcers. No induration Neurologic: CN 2-12 grossly intact. Sensation intact, DTR normal. Strength 5/5 in all 4.  Psychiatric: Normal judgment and insight. Alert and oriented x 3. Normal mood.    Labs on Admission: I have personally reviewed following labs and imaging studies  CBC: Recent Labs  Lab 05/04/20 1724  WBC 9.9  NEUTROABS 5.4  HGB 9.1*  HCT 29.2*  MCV 86.9  PLT [id number]   Basic Metabolic Panel: Recent Labs  Lab 05/04/20 1724  NA 127*  K 2.9*  CL 87*  CO2 30  GLUCOSE 116*  BUN 30*  CREATININE 1.53*  CALCIUM 8.5*   GFR: CrCl cannot be calculated (Unknown ideal weight.). Liver Function Tests: Recent Labs  Lab 05/04/20 1724  AST 41  ALT 30  ALKPHOS 229*  BILITOT 0.5  PROT 6.5  ALBUMIN 3.1*   No results for input(s): LIPASE, AMYLASE in the last 168 hours. No results for input(s): AMMONIA in the last 168 hours. Coagulation Profile: No results for input(s): INR, PROTIME in the last 168 hours. Cardiac Enzymes: No results for input(s): CKTOTAL, CKMB, CKMBINDEX, TROPONINI in the last 168 hours. BNP (last 3 results) No results for input(s): PROBNP in the last 8760 hours. HbA1C: No results for input(s): HGBA1C in the last 72 hours. CBG: No results for input(s): GLUCAP in the last 168 hours. Lipid Profile: No results for input(s): CHOL, HDL, LDLCALC, TRIG, CHOLHDL, LDLDIRECT in the last 72 hours. Thyroid Function Tests: No results for input(s): TSH, T4TOTAL, FREET4, T3FREE, THYROIDAB in the last 72 hours. Anemia Panel: No results for input(s): VITAMINB12, FOLATE, FERRITIN, TIBC, IRON, RETICCTPCT in the last 72 hours. Urine analysis:    Component Value Date/Time   COLORURINE STRAW (A) 05/04/2020 1900   APPEARANCEUR CLEAR 05/04/2020 1900   LABSPEC 1.005 05/04/2020 1900   PHURINE 6.0 05/04/2020 1900   GLUCOSEU NEGATIVE 05/04/2020 1900   HGBUR NEGATIVE 05/04/2020 1900    BILIRUBINUR NEGATIVE 05/04/2020 1900   KETONESUR NEGATIVE 05/04/2020 1900   PROTEINUR NEGATIVE 05/04/2020 1900   UROBILINOGEN 0.2 08/23/2018 1044   NITRITE NEGATIVE 05/04/2020 1900   LEUKOCYTESUR NEGATIVE 05/04/2020 1900    Radiological Exams on Admission: DG Chest 2 View  Result Date: 05/04/2020 CLINICAL DATA:  Short  of breath EXAM: CHEST - 2 VIEW COMPARISON:  None 521 FINDINGS: Frontal and lateral views of the chest demonstrate stable aortic valve prosthesis. The cardiac silhouette is unremarkable. No acute airspace disease, effusion, or pneumothorax. Mild chronic interstitial scarring. Stable hiatal hernia. IMPRESSION: 1. No acute intrathoracic process. 2. Stable hiatal hernia. 3. Stable interstitial scarring. Electronically Signed   By: Randa Ngo M.D.   On: 05/04/2020 18:48   NM Pulmonary Perfusion  Result Date: 05/05/2020 CLINICAL DATA:  PE suspected, low to intermediate probability, positive D-dimer, history of CHF and mitral regurgitation EXAM: NUCLEAR MEDICINE PERFUSION LUNG SCAN TECHNIQUE: Perfusion images were obtained in multiple projections after intravenous injection of radiopharmaceutical. Ventilation scans intentionally deferred if perfusion scan and chest x-ray adequate for interpretation during COVID 19 epidemic. RADIOPHARMACEUTICALS:  4.1 mCi Tc-39mMAA IV COMPARISON:  Chest radiograph 05/04/2020 FINDINGS: No significant perfusion defects are identified on this perfusion only examination of the lungs. Review of the chest radiograph reveals some chronic interstitial scarring but otherwise clear lungs which could account for some minimal heterogeneity of distribution. IMPRESSION: Pulmonary embolism absent (normal or very low probability) per the modified perfusion-only PIOPED II criteria Reference: Sostman HD, Miniati M, GGayleen OremPD, PIrwinSensitivity and specificity of perfusion scintigraphy combined with chest radiography for acute pulmonary embolism  in PIOPED II. (2008) Journal of nuclear medicine : official publication, Society of Nuclear Medicine. 49 (11): 1741-8. doi:10.2967/jnumed.1999-72-3118Electronically Signed   By: PLovena LeM.D.   On: 05/05/2020 01:00    EKG: Independently reviewed.  Assessment/Plan Principal Problem:   Acute respiratory failure with hypoxia (HCC) Active Problems:   HTN (hypertension)   Dyspnea on exertion    1. Hypoxia on exertion - 1. ? Cardiogenic vs pulmonary in origin 2. Get PFTs 3. Consider consulting Dr. GEinar Gipin AM to see if he has any further cardiac work up to recommend 4. Had grade 2 DD but nl EF as of last echo 2. Contraction alkalosis - 1. IVF: 500cc NS bolus then NS at 125 2. Replace K 3. Repeat BMP in AM 4. Stopping home lasix 3. HTN - 1. Cont home BP meds  DVT prophylaxis: Lovenox Code Status: Full Family Communication: No family in room Disposition Plan: Home after hypoxia work up CC.H. Robinson Worldwidecalled: None Admission status: [facility] [facility]  How to contact the TValor HealthAttending or Consulting provider 7[place] covering provider during after hours [place] for this patient?  1. Check the care team in [facility]and look for a) attending/consulting TR[facility] provider listed and b) the [facility] Hospitalteam listed 2. Log into [email or web address]  Amion Physician Scheduling and messaging for groups and whole hospitals  On call and physician scheduling software for group practices, residents, hospitalists and other medical providers for call, clinic, rotation and shift schedules. OnCall Enterprise is a hospital-wide system for scheduling doctors and paging doctors on call. EasyPlot is for scientific plotting and data analysis.  [email or web address]  and use [place]'s universal password to access. If you do not have the password, please contact the hospital operator.  3. Locate the TClinical Associates Pa Dba Clinical Associates Ascprovider you are looking for under [facility] and page to a number that you can be  directly reached. 4. If you still have difficulty reaching the provider, please page the [facility](Director on Call) for the Hospitalists listed on amion for assistance.  05/05/2020, 4:52 AM

## 2020-05-05 NOTE — ED Notes (Signed)
MD rancour at bedside

## 2020-05-05 NOTE — ED Notes (Signed)
Daughter at bedside.

## 2020-05-05 NOTE — ED Provider Notes (Signed)
Care assumed from Dr. Tomi Bamberger.  Patient here with several weeks of shortness of breath, weight loss, fatigue and some low-grade temperatures at home.  Labs show hypokalemia and hyponatremia.  Mild elevation of BUN and creatinine.  VQ scan pending.  VQ is negative.  Patient becomes hypoxic and dyspneic with ambulation with O2 saturations dropping to 80 but quickly recovered to room air.  Patient and daughter are concerned about her going home  Patient with hypoxia with ambulation of uncertain etiology.  VQ scan is negative for pulmonary embolism and she does not appear to be volume overloaded.  No formal diagnosis of COPD as she is not wheezing.  With hypoxia and dyspnea on exertion will need admission.  Discussed with Dr. Alcario Drought.   Ezequiel Essex, MD 05/05/20 [phone]

## 2020-05-05 NOTE — ED Notes (Signed)
Pt to VQ Scan

## 2020-05-05 NOTE — Progress Notes (Addendum)
This is a very pleasant 87 year old lady with history of CAD status post stent, AVSD status post TAVR in January, hypertension, hyperlipidemia and pulmonary nodules presented to ED with a complaint of exertional shortness of breath and was admitted to hospitalist service.  Interestingly, her chest x-ray is completely clear.  No pulmonary edema.  No bilateral lower extremity edema.  No formal diagnosis of COPD though they did see some emphysema findings in the lung on CT scan done in December.  Per patient, she never smoked, she does not have any passive smoking exposure either and did not have any occupational exposure either.  She never drank alcohol in her life.  D-dimer was elevated but VQ scan ruled out DVT.  Despite of dyspnea, she is not hypoxic.  She was seen and examined in the ED.  According to her, she feels fine as long as she is in the bed sitting or any laying flat but feels shortness of breath with routine exertion at home.  Her last echo was done more than a year ago.  I wonder if her symptoms are due to angina.  I will repeat transthoracic echo to take a fresh look at her ejection fraction and aortic valve.  Fortunately her cardiac enzymes are normal.  Her blood pressure is also on the lower side.  According to her, her blood pressure remains on the high side for which she takes beta-blocker.  She is ruled out of Covid.  Lungs clear to auscultation with some diminished breath sounds.

## 2020-05-06 DIAGNOSIS — J9601 Acute respiratory failure with hypoxia: Secondary | ICD-10-CM | POA: Diagnosis not present

## 2020-05-06 LAB — COMPREHENSIVE METABOLIC PANEL
ALT: 27 U/L (ref 0–44)
AST: 41 U/L (ref 15–41)
Albumin: 2.3 g/dL — ABNORMAL LOW (ref 3.5–5.0)
Alkaline Phosphatase: 180 U/L — ABNORMAL HIGH (ref 38–126)
Anion gap: 9 (ref 5–15)
BUN: 16 mg/dL (ref 8–23)
CO2: 22 mmol/L (ref 22–32)
Calcium: 7.8 mg/dL — ABNORMAL LOW (ref 8.9–10.3)
Chloride: 104 mmol/L (ref 98–111)
Creatinine, Ser: 1.14 mg/dL — ABNORMAL HIGH (ref 0.44–1.00)
GFR, Estimated: 47 mL/min — ABNORMAL LOW (ref >60)
Glucose, Bld: 95 mg/dL (ref 70–99)
Potassium: 3.8 mmol/L (ref 3.5–5.1)
Sodium: 135 mmol/L (ref 135–145)
Total Bilirubin: 0.2 mg/dL — ABNORMAL LOW (ref 0.3–1.2)
Total Protein: 5 g/dL — ABNORMAL LOW (ref 6.5–8.1)

## 2020-05-06 LAB — ECHOCARDIOGRAM LIMITED
AR max vel: 0.77 cm2
AV Area VTI: 0.91 cm2
AV Area mean vel: 0.76 cm2
AV Mean grad: 14 mmHg
AV Peak grad: 25.8 mmHg
Ao pk vel: 2.54 m/s
S' Lateral: 2.1 cm

## 2020-05-06 NOTE — Evaluation (Signed)
Physical Therapy Evaluation Patient Details Name: [name] [name] MRN: [id number] DOB: [birth year] Today's Date: 05/06/2020   History of Present Illness  87 y.o.  female presents to ED 05/05/20 with shortness of breath especially with exertion. In ED found to hypoxic with ambulation with SaO2 in upper 80s on RA  admitted for observation PMH: hypertension, mixed hyperlipidemia, coronary artery disease, severe aortic stenosis s/p 23 mm evolute pro TAVR 01/2019, mild stable paravalvular leak, pulmonary nodules that appeared benign on most recent CT (dec 21) without change, large hiatal hernia.  Clinical Impression  PTA pt living with husband with memory deficits, in multistory home with bed and bath on first floor. Pt is completely independent with mobility, bADLs, and iADLs, is caregiver for husband, drives. Pt is currently limited in safe mobility by 4/4 DoE with ambulation over 400 feet. Despite DoE pt with SaO2 on RA of 92% O2. PT provided Education on energy conservation, asked about more help from family. Pt reports it is not possible, however wonders if it is not time to use the couples long term care insurance. PT endorses investigating further. Pt has no real PT needs at discharge, however PT will continue to see acutely.     Follow Up Recommendations No PT follow up;Other (comment) (HHAide has long term care insurance)    Equipment Recommendations  Other (comment) (has necessary equipment)       Precautions / Restrictions Precautions Precautions: None Restrictions Weight Bearing Restrictions: No      Mobility  Bed Mobility Overal bed mobility: Modified Independent             General bed mobility comments: increased time and effort    Transfers Overall transfer level: Modified independent               General transfer comment: increased time  Ambulation/Gait Ambulation/Gait assistance: Supervision Gait Distance (Feet): 450 Feet Assistive device: None Gait  Pattern/deviations: Step-through pattern;WFL(Within Functional Limits) Gait velocity: slowed Gait velocity interpretation: >4.37 ft/sec, indicative of normal walking speed General Gait Details: supervision for safety, pt with 4/4 DoE by end of ambulation, requiring 3 min to recover      Balance Overall balance assessment: Modified Independent                                           Pertinent Vitals/Pain Pain Assessment: No/denies pain    Home Living Family/patient expects to be discharged to:: Private residence Living Arrangements: Spouse/significant other Available Help at Discharge: Family Type of Home: House Home Access: Level entry     Home Layout: Able to live on main level with bedroom/bathroom Home Equipment: Walker - 2 wheels;Bedside commode;Grab bars - tub/shower      Prior Function Level of Independence: Independent         Comments: has been needing to use cart for ambulation in grocery store     Hand Dominance        Extremity/Trunk Assessment   Upper Extremity Assessment Upper Extremity Assessment: Generalized weakness    Lower Extremity Assessment Lower Extremity Assessment: Generalized weakness       Communication   Communication: No difficulties  Cognition Arousal/Alertness: Awake/alert Behavior During Therapy: WFL for tasks assessed/performed Overall Cognitive Status: Within Functional Limits for tasks assessed  General Comments General comments (skin integrity, edema, etc.): Pt 98%O2 on RA at rest, drops to 92%O2 with long distance ambulation and 4/4 DoE requiring increased time to recover, max HR noted 115 bpm        Assessment/Plan    PT Assessment Patient needs continued PT services  PT Problem List Cardiopulmonary status limiting activity       PT Treatment Interventions Gait training;Stair training;Therapeutic exercise;Cognitive remediation    PT  Goals (Current goals can be found in the Care Plan section)  Acute Rehab PT Goals Patient Stated Goal: be less exhausted PT Goal Formulation: With patient Time For Goal Achievement: 05/20/20 Potential to Achieve Goals: Good    Frequency Min 3X/week    AM-PAC PT "6 Clicks" Mobility  Outcome Measure Help needed turning from your back to your side while in a flat bed without using bedrails?: None Help needed moving from lying on your back to sitting on the side of a flat bed without using bedrails?: None Help needed moving to and from a bed to a chair (including a wheelchair)?: None Help needed standing up from a chair using your arms (e.g., wheelchair or bedside chair)?: None Help needed to walk in hospital room?: None Help needed climbing 3-5 steps with a railing? : A Little 6 Click Score: 23    End of Session   Activity Tolerance: Patient limited by fatigue Patient left: in bed;with call bell/phone within reach Nurse Communication: Mobility status;Other (comment) (no increased O2 demand with long distance ambulation) PT Visit Diagnosis: Difficulty in walking, not elsewhere classified (R26.2)    Time: [id number] PT Time Calculation (min) (ACUTE ONLY): 30 min   Charges:   PT Evaluation $PT Eval Moderate Complexity: 1 Mod PT Treatments $Therapeutic Exercise: 8-22 mins        Rally Ouch B. Migdalia Dk PT, DPT Acute Rehabilitation Services Pager [phone] Office [phone]   [place] 05/06/2020, 4:18 PM

## 2020-05-06 NOTE — Progress Notes (Signed)
PROGRESS NOTE    [name] [name]  [id number] DOB: [birth year] DOA: 05/04/2020 PCP: Kelton Pillar, MD   Brief Narrative:    Assessment & Plan:   Principal Problem:   Acute respiratory failure with hypoxia ([place]) Active Problems:   HTN (hypertension)   Dyspnea on exertion   Exertional dyspnea: Patient still feels exertional dyspnea.  No dyspnea or chest pain at rest.  Daughter claims that patient's oxygen saturation dropped when she was going to the bathroom today however there is no documentation about that.  Daughter wants patient to go home on oxygen.  I have ordered ambulatory oximetry.  Patient's dyspnea could very well be angina equivalent.  She was seen by cardiology.  They are going to arrange further work-up as outpatient which will include pulmonary function test, follow with the pulmonologist, nuclear stress test. No Doubt hiatal hernia could also be causing this which will also be evaluated as outpatient once both pulmonary and cardiac issues are ruled out.  Coronary artery disease: History of LAD stenting prior to TAVR.  Of note, patient never had chest pain even prior to her PCI.  This makes angina equivalent possible cause of her shortness of breath.  Continue current medical treatment, including Plavix, Pletal, Zetia. She is currently on propranolol, but no other beta-blocker.  Appreciate cardiology evaluation  Pulmonary pathology: Patient has stable interstitial scarring on chest x-ray.  CT chest in 12/2019 also showed groundglass changes.  Cardiology will arrange outpatient follow-up with pulmonology and PFTs.  Hiatal hernia: Cardiology to arrange outpatient surgery evaluation.  Possible chronic sinusitis: Recommend evaluation by ENT outpatient.  Hyperlipidemia: Continue statin.  Deconditioning/social situation: Patient lives with her husband and she is the sole caretaker of her husband.  None of her children lives nearby to be able to help her.  Her daughter  who is at the bedside is very concerned about patient going home today unless she has home health care and they can arrange some other backup care as well.  I have requested PT consultation to evaluate patient for possible home health care and I have also ordered ambulatory oximetry.  We will plan on discharging patient tomorrow.  DVT prophylaxis: enoxaparin (LOVENOX) injection 40 mg Start: 05/05/20 0600   Code Status: Full Code  Family Communication: Patient's daughter present at bedside.  Plan of care discussed with her in great length.  Status is: Observation  The patient will require care spanning > 2 midnights and should be moved to inpatient because: Unsafe d/c plan  Dispo: The patient is from: Home              Anticipated d/c is to: Home              Patient currently is not medically stable to d/c.   Difficult to place patient No        Estimated body mass index is 23.63 kg/m as calculated from the following:   Height as of 03/20/20: 5' (1.524 m).   Weight as of 03/20/20: 54.9 kg.      Nutritional status:               Consultants:   Cardiology  Procedures:   None  Antimicrobials:  Anti-infectives (From admission, onward)   None         Subjective: Patient seen and examined.  Patient sitting in the chair.  Still complains of exertional dyspnea.  Her daughter at the bedside.  Her daughter had several questions which were addressed during  over 20-minute conversation today where she asked me several questions about elevated D-dimer and what that means and how VQ scan is good enough to rule out PE on this patient where we cannot do CT angiogram due to elevated creatinine.  Daughter concerned that patient's dyspnea could very well be coming from acute sinusitis.  However patient has no signs of acute sinusitis such as nasal congestion, fever, headache and no sinus tenderness on examination.  Daughter fully convinced that patient has acute sinusitis.  She  was reassured that patient does not have any signs of acute sinusitis.  Although she may be having chronic sinusitis which does not need to be treated with antibiotics.  Daughter requested ENT consultation in the hospital.  She was told that ENT consultation as inpatient is not warranted since there is nothing acute and this can be taken care of as outpatient.  We also discussed about patient's need to be seen by pulmonary and that can be done as an outpatient as well.  She also discussed hiatal hernia.  Long discussion overall.  Objective: Vitals:   05/05/20 1500 05/05/20 1557 05/05/20 2121 05/06/20 0445  BP: (!) 104/56 112/65 (!) 105/53 (!) 112/51  Pulse: 77 82 88 87  Resp: '16 18 16 16  '$ Temp: 98.2 F (36.8 C) 98.1 F (36.7 C) 98.5 F (36.9 C) 99.3 F (37.4 C)  TempSrc: Oral  Oral Oral  SpO2: 97% 100% 97% 97%   No intake or output data in the 24 hours ending 05/06/20 1344 There were no vitals filed for this visit.  Examination:  General exam: Appears calm and comfortable  Respiratory system: Clear to auscultation. Respiratory effort normal. Cardiovascular system: S1 & S2 heard, RRR. No JVD, murmurs, rubs, gallops or clicks. No pedal edema. Gastrointestinal system: Abdomen is nondistended, soft and nontender. No organomegaly or masses felt. Normal bowel sounds heard. Central nervous system: Alert and oriented. No focal neurological deficits. Extremities: Symmetric 5 x 5 power. Skin: No rashes, lesions or ulcers.  Psychiatry: Judgement and insight appear normal. Mood & affect appropriate.    Data Reviewed: I have personally reviewed following labs and imaging studies  CBC: Recent Labs  Lab 05/04/20 1724  WBC 9.9  NEUTROABS 5.4  HGB 9.1*  HCT 29.2*  MCV 86.9  PLT [id number]   Basic Metabolic Panel: Recent Labs  Lab 05/04/20 1724 05/05/20 0650 05/06/20 0111  NA 127* 134* 135  K 2.9* 3.7 3.8  CL 87* 101 104  CO2 '30 29 22  '$ GLUCOSE 116* 97 95  BUN 30* 21 16  CREATININE  1.53* 1.32* 1.14*  CALCIUM 8.5* 7.6* 7.8*   GFR: CrCl cannot be calculated (Unknown ideal weight.). Liver Function Tests: Recent Labs  Lab 05/04/20 1724 05/06/20 0111  AST 41 41  ALT 30 27  ALKPHOS 229* 180*  BILITOT 0.5 0.2*  PROT 6.5 5.0*  ALBUMIN 3.1* 2.3*   No results for input(s): LIPASE, AMYLASE in the last 168 hours. No results for input(s): AMMONIA in the last 168 hours. Coagulation Profile: No results for input(s): INR, PROTIME in the last 168 hours. Cardiac Enzymes: No results for input(s): CKTOTAL, CKMB, CKMBINDEX, TROPONINI in the last 168 hours. BNP (last 3 results) No results for input(s): PROBNP in the last 8760 hours. HbA1C: No results for input(s): HGBA1C in the last 72 hours. CBG: Recent Labs  Lab 05/05/20 2118  GLUCAP 140*   Lipid Profile: No results for input(s): CHOL, HDL, LDLCALC, TRIG, CHOLHDL, LDLDIRECT in the last 72 hours.  Thyroid Function Tests: No results for input(s): TSH, T4TOTAL, FREET4, T3FREE, THYROIDAB in the last 72 hours. Anemia Panel: No results for input(s): VITAMINB12, FOLATE, FERRITIN, TIBC, IRON, RETICCTPCT in the last 72 hours. Sepsis Labs: No results for input(s): PROCALCITON, LATICACIDVEN in the last 168 hours.  Recent Results (from the past 240 hour(s))  Resp Panel by RT-PCR (Flu A&B, Covid) Nasopharyngeal Swab     Status: None   Collection Time: 05/05/20  1:07 AM   Specimen: Nasopharyngeal Swab; Nasopharyngeal(NP) swabs in vial transport medium  Result Value Ref Range Status   SARS Coronavirus 2 by RT PCR NEGATIVE NEGATIVE Final    Comment: (NOTE) SARS-CoV-2 target nucleic acids are NOT DETECTED.  The SARS-CoV-2 RNA is generally detectable in upper respiratory specimens during the acute phase of infection. The lowest concentration of SARS-CoV-2 viral copies this assay can detect is 138 copies/mL. A negative result does not preclude SARS-Cov-2 infection and should not be used as the sole basis for treatment or other  patient management decisions. A negative result may occur with  improper specimen collection/handling, submission of specimen other than nasopharyngeal swab, presence of viral mutation(s) within the areas targeted by this assay, and inadequate number of viral copies(<138 copies/mL). A negative result must be combined with clinical observations, patient history, and epidemiological information. The expected result is Negative.  Fact Sheet for Patients:  EntrepreneurPulse.com.au  Fact Sheet for Healthcare Providers:  IncredibleEmployment.be  This test is no t yet approved or cleared by the [place] FDA and  has been authorized for detection and/or diagnosis of SARS-CoV-2 by FDA under an Emergency Use Authorization (EUA). This EUA will remain  in effect (meaning this test can be used) for the duration of the COVID-19 declaration under Section 564(b)(1) of the Act, 21 U.S.C.section 360bbb-3(b)(1), unless the authorization is terminated  or revoked sooner.       Influenza A by PCR NEGATIVE NEGATIVE Final   Influenza B by PCR NEGATIVE NEGATIVE Final    Comment: (NOTE) The Xpert Xpress SARS-CoV-2/FLU/RSV plus assay is intended as an aid in the diagnosis of influenza from Nasopharyngeal swab specimens and should not be used as a sole basis for treatment. Nasal washings and aspirates are unacceptable for Xpert Xpress SARS-CoV-2/FLU/RSV testing.  Fact Sheet for Patients: EntrepreneurPulse.com.au  Fact Sheet for Healthcare Providers: IncredibleEmployment.be  This test is not yet approved or cleared by the [place] FDA and has been authorized for detection and/or diagnosis of SARS-CoV-2 by FDA under an Emergency Use Authorization (EUA). This EUA will remain in effect (meaning this test can be used) for the duration of the COVID-19 declaration under Section 564(b)(1) of the Act, 21 U.S.C. section  360bbb-3(b)(1), unless the authorization is terminated or revoked.  Performed at [facility], [place] [address], [place], [place] [address]       Radiology Studies: DG Chest 2 View  Result Date: 05/04/2020 CLINICAL DATA:  Short of breath EXAM: CHEST - 2 VIEW COMPARISON:  None 521 FINDINGS: Frontal and lateral views of the chest demonstrate stable aortic valve prosthesis. The cardiac silhouette is unremarkable. No acute airspace disease, effusion, or pneumothorax. Mild chronic interstitial scarring. Stable hiatal hernia. IMPRESSION: 1. No acute intrathoracic process. 2. Stable hiatal hernia. 3. Stable interstitial scarring. Electronically Signed   By: Randa Ngo M.D.   On: 05/04/2020 18:48   NM Pulmonary Perfusion  Result Date: 05/05/2020 CLINICAL DATA:  PE suspected, low to intermediate probability, positive D-dimer, history of CHF and mitral regurgitation EXAM: NUCLEAR  MEDICINE PERFUSION LUNG SCAN TECHNIQUE: Perfusion images were obtained in multiple projections after intravenous injection of radiopharmaceutical. Ventilation scans intentionally deferred if perfusion scan and chest x-ray adequate for interpretation during COVID 19 epidemic. RADIOPHARMACEUTICALS:  4.1 mCi Tc-57mMAA IV COMPARISON:  Chest radiograph 05/04/2020 FINDINGS: No significant perfusion defects are identified on this perfusion only examination of the lungs. Review of the chest radiograph reveals some chronic interstitial scarring but otherwise clear lungs which could account for some minimal heterogeneity of distribution. IMPRESSION: Pulmonary embolism absent (normal or very low probability) per the modified perfusion-only PIOPED II criteria Reference: Sostman HD, Miniati M, GGayleen OremPD, PKirkwoodSensitivity and specificity of perfusion scintigraphy combined with chest radiography for acute pulmonary embolism in PIOPED II. (2008) Journal of nuclear medicine : official publication, Society of  Nuclear Medicine. 49 (11): 1741-8. doi:10.2967/jnumed.1999-72-3118Electronically Signed   By: PLovena LeM.D.   On: 05/05/2020 01:00   ECHOCARDIOGRAM LIMITED  Result Date: 05/06/2020    ECHOCARDIOGRAM LIMITED REPORT   Patient Name:   [name] LWINDate of Exam: 05/05/2020 Medical Rec #:  [id number]    Height:       60.0 in Accession #:    [id number]   Weight:       121.0 lb Date of Birth:  903/20/1933    BSA:          1.508 m Patient Age:    89years      BP:           103/55 mmHg Patient Gender: F             HR:           80 bpm. Exam Location:  Inpatient Procedure: Limited Echo, Color Doppler and Limited Color Doppler Indications:     Dyspnea  History:         Patient has prior history of Echocardiogram examinations, most                  recent 02/15/2020. CAD, Prior TAVR, Aortic Valve Disease; Risk                  Factors:Hypertension and Dyslipidemia.  Sonographer:     MMaudry MayhewMHA, RDMS, RVT, RDCS Referring Phys:  [id number] Naftoli Penny Diagnosing Phys: MVernell LeepMD IMPRESSIONS  1. Left ventricular ejection fraction, by estimation, is 55 to 60%. The left ventricle has normal function. The left ventricle has no regional wall motion abnormalities. There is mild left ventricular hypertrophy. Left ventricular diastolic function could not be evaluated.  2. Right ventricular systolic function is normal. The right ventricular size is normal. There is normal pulmonary artery systolic pressure.  3. Well seated 23 mm Evolute Pro TAVR valve. Mild paravalvular leak. Mean PG 14 mmhg only mildly higher than prior (9 mmHg). DVR of 0.44 suggests no significant aortic stenosis.  4. Mild mitral leaflet thickening. Trivial mitral regurgitation.  5. Mild to moderate tricuspid regurgitation. Estimated PASP 32 mmHg.  6. Mild pulmonic regurgitation.  7. Compared to previous outpatient study on 02/15/2020, no major chane noted. FINDINGS  Left Ventricle: Left ventricular ejection fraction, by estimation, is 55 to  60%. The left ventricle has normal function. The left ventricle has no regional wall motion abnormalities. There is mild left ventricular hypertrophy. Left ventricular diastolic  function could not be evaluated. Right Ventricle: The right ventricular size is normal. No increase in right ventricular wall thickness. Right ventricular systolic  function is normal. There is normal pulmonary artery systolic pressure. The tricuspid regurgitant velocity is 2.69 m/s, and  with an assumed right atrial pressure of 3 mmHg, the estimated right ventricular systolic pressure is 123XX123 mmHg. Left Atrium: Left atrial size was normal in size. Right Atrium: Right atrial size was normal in size. Mitral Valve: The mitral valve is abnormal. There is mild thickening of the mitral valve leaflet(s). Trivial mitral valve regurgitation. Tricuspid Valve: The tricuspid valve is grossly normal. Tricuspid valve regurgitation is mild to moderate. Aortic Valve: Aortic valve regurgitation Mild paravalvular leak. Aortic valve mean gradient measures 14.0 mmHg. Aortic valve peak gradient measures 25.8 mmHg. Aortic valve area, by VTI measures 0.91 cm. There is a 23 mm CoreValve-Evolut Pro prosthetic, stented (TAVR) valve present in the aortic position. Pulmonic Valve: The pulmonic valve was grossly normal. Pulmonic valve regurgitation is mild. Aorta: The aortic root and ascending aorta are structurally normal, with no evidence of dilitation. Venous: The inferior vena cava is normal in size with greater than 50% respiratory variability, suggesting right atrial pressure of 3 mmHg. IAS/Shunts: No atrial level shunt detected by color flow Doppler. LEFT VENTRICLE PLAX 2D LVIDd:         3.00 cm LVIDs:         2.10 cm LV PW:         0.70 cm LV IVS:        1.10 cm LVOT diam:     1.50 cm LV SV:         44 LV SV Index:   29 LVOT Area:     1.77 cm  LEFT ATRIUM             Index       RIGHT ATRIUM          Index LA diam:        2.30 cm 1.53 cm/m  RA Area:     9.75  cm LA Vol (A2C):   12.6 ml 8.36 ml/m  RA Volume:   17.30 ml 11.47 ml/m LA Vol (A4C):   45.3 ml 30.05 ml/m LA Biplane Vol: 24.2 ml 16.05 ml/m  AORTIC VALVE AV Area (Vmax):    0.77 cm AV Area (Vmean):   0.76 cm AV Area (VTI):     0.91 cm AV Vmax:           254.00 cm/s AV Vmean:          179.000 cm/s AV VTI:            0.479 m AV Peak Grad:      25.8 mmHg AV Mean Grad:      14.0 mmHg LVOT Vmax:         111.00 cm/s LVOT Vmean:        77.400 cm/s LVOT VTI:          0.248 m LVOT/AV VTI ratio: 0.52 TRICUSPID VALVE TR Peak grad:   28.9 mmHg TR Vmax:        269.00 cm/s  SHUNTS Systemic VTI:  0.25 m Systemic Diam: 1.50 cm Vernell Leep MD Electronically signed by Vernell Leep MD Signature Date/Time: 05/06/2020/9:05:45 AM    Final     Scheduled Meds: . atorvastatin  10 mg Oral Daily  . calcium-vitamin D  0.5 tablet Oral Daily  . cholecalciferol  1,000 Units Oral Daily  . clopidogrel  75 mg Oral Daily  . enoxaparin (LOVENOX) injection  40 mg Subcutaneous Q24H  . ezetimibe  10 mg  Oral Daily  . fluticasone  2 spray Each Nare Daily  . levothyroxine  50 mcg Oral Q0600  . loratadine  10 mg Oral Daily  . multivitamin with minerals  1 tablet Oral Daily  . propranolol  20 mg Oral TID   Continuous Infusions: . sodium chloride 1,000 mL (05/06/20 [id number])     LOS: 0 days   Time spent: 42 minutes   Darliss Cheney, MD [facility]  05/06/2020, 1:44 PM   To contact the attending provider between 7A-7P or the covering provider during after hours 7P-7A, please log into the web site [email or web address].

## 2020-05-06 NOTE — Consult Note (Signed)
CARDIOLOGY CONSULT NOTE  Patient ID: [name] [name] MRN: [id number] DOB/AGE: [birth year] 87 y.o.  Admit date: 05/04/2020  Referring Physician: [facility] hospitalist Reason for Consultation: Shortness of breath  HPI:   87 y.o. Caucasian female  with hypertension, mixed hyperlipidemia, coronary artery disease, severe aortic stenosis s/p 23 mm evolute pro TAVR 01/2019, mild stable paravalvular leak, now admitted with shortness of breath  Patient has had worsening exertional dyspnea with minimal activity for last several weeks to months.  Symptoms have progressed to current situation, where she gets short of breath and hypoxic to as low as 81%, per the daughter, with walking to the bathroom or dinner table.  She denies any chest pain.  Also denies any presyncope, syncope.  Leg edema is controlled with Lasix.  She denies any orthopnea, PND.  Patient has had work-up for paravalvular leak following her TAVR in 01/2019.  CT scan showed small aortic root, small valve and degree of calcification posterior to the stent well likely to be because of paravalvular leak.  She was evaluated by Dr. Angelena Form in 02/2019.  Case was also discussed with [employer] Physiological scientist.  Recommendation was to monitor unless there was severe progression of symptoms.  Work-up during this hospitalization it showed negative troponins, BNP.  Chest x-ray showed chronic interstitial lung markings.  Reviewed recent CT chest and abdomen from December 2021 that show moderate progress opacities, and large hiatal hernia.   Past Medical History:  Diagnosis Date  . Arthritis   . Asthma    history of  . CAD (coronary artery disease)   . Dyspnea    sometimes sitting.lying and on exertion  . GERD (gastroesophageal reflux disease)   . History of hiatal hernia   . History of transcatheter aortic valve replacement (TAVR) 01/26/2019   (TAVR  23 mm Evolute Pro in aortic position 01/26/2019) .   [place] Kitchen Hyperlipidemia   . Hypertension   .  Hypothyroidism   . Lipoma    left arm  . Nasal polyps   . Pneumonia   . Pulmonary nodule    noted on pre TAVR CT  . Severe aortic stenosis      Past Surgical History:  Procedure Laterality Date  . CHOLECYSTECTOMY  02/27/04  . CORONARY ATHERECTOMY N/A 01/05/2019   Procedure: CORONARY ATHERECTOMY;  Surgeon: Adrian Prows, MD;  Location: [facility];  Service: Cardiovascular;  Laterality: N/A;  . CORONARY BALLOON ANGIOPLASTY N/A 01/05/2019   Procedure: CORONARY BALLOON ANGIOPLASTY;  Surgeon: Adrian Prows, MD;  Location: [facility];  Service: Cardiovascular;  Laterality: N/A;  . CORONARY STENT INTERVENTION N/A 01/05/2019   Procedure: CORONARY STENT INTERVENTION;  Surgeon: Adrian Prows, MD;  Location: [facility];  Service: Cardiovascular;  Laterality: N/A;  . CORONARY STENT INTERVENTION  01/05/2019  . INCONTINENCE SURGERY  09/12/09  . NASAL SINUS SURGERY     several    . ORIF ANKLE FRACTURE Right 09/11/2013   Procedure: OPEN REDUCTION INTERNAL FIXATION (ORIF) ANKLE FRACTURE;  Surgeon: Augustin Schooling, MD;  Location: [facility];  Service: Orthopedics;  Laterality: Right;  . RIGHT/LEFT HEART CATH AND CORONARY ANGIOGRAPHY N/A 12/15/2018   Procedure: RIGHT/LEFT HEART CATH AND CORONARY ANGIOGRAPHY;  Surgeon: Adrian Prows, MD;  Location: [facility];  Service: Cardiovascular;  Laterality: N/A;  . ROTATOR CUFF REPAIR  09/27/2003  . TRANSCATHETER AORTIC VALVE REPLACEMENT, TRANSFEMORAL  01/26/2019  . TRANSCATHETER AORTIC VALVE REPLACEMENT, TRANSFEMORAL N/A 01/26/2019   Procedure: TRANSCATHETER AORTIC VALVE REPLACEMENT, TRANSFEMORAL;  Surgeon: Burnell Blanks, MD;  Location: [facility];  Service: Open Heart Surgery;  Laterality: N/A;  . TRIGGER FINGER RELEASE  10/29/06      Family History  Problem Relation Age of Onset  . Tuberculosis Mother   . Other Father        Bronchiectasis  . Cancer Maternal Grandmother        ovarian  . Sarcoidosis Sister   . Asthma Maternal Grandfather    . Heart disease Brother   . Heart attack Brother   . Breast cancer Daughter      Social History: Social History   Socioeconomic History  . Marital status: Married    Spouse name: Not on file  . Number of children: 3  . Years of education: Not on file  . Highest education level: Not on file  Occupational History  . Not on file  Tobacco Use  . Smoking status: Never Smoker  . Smokeless tobacco: Never Used  Vaping Use  . Vaping Use: Never used  Substance and Sexual Activity  . Alcohol use: No  . Drug use: No  . Sexual activity: Not on file  Other Topics Concern  . Not on file  Social History Narrative  . Not on file   Social Determinants of Health   Financial Resource Strain: Not on file  Food Insecurity: Not on file  Transportation Needs: Not on file  Physical Activity: Not on file  Stress: Not on file  Social Connections: Not on file  Intimate Partner Violence: Not on file     Medications Prior to Admission  Medication Sig Dispense Refill Last Dose  . atorvastatin (LIPITOR) 10 MG tablet Take 1 tablet (10 mg total) by mouth daily. 90 tablet 3 05/04/2020 at Unknown time  . calcium-vitamin D (OSCAL WITH D) 250-125 MG-UNIT tablet Take 1 tablet by mouth daily.   04/20/2020  . cetirizine (ZYRTEC) 10 MG tablet Take 10 mg by mouth daily.   05/04/2020 at Unknown time  . cholecalciferol (VITAMIN D) 25 MCG (1000 UT) tablet Take 1,000 Units by mouth daily.   Past Month at Unknown time  . clopidogrel (PLAVIX) 75 MG tablet TAKE 1 TABLET BY MOUTH EVERY DAY (Patient taking differently: Take 75 mg by mouth daily.) 90 tablet 1 05/04/2020 at 9 am  . furosemide (LASIX) 20 MG tablet Take 1 tablet (20 mg total) by mouth 2 (two) times daily as needed for fluid (am and 3 pm for 3 days then as needed for shortness of breath). (Patient taking differently: Take 20 mg by mouth daily.) 60 tablet 2 05/04/2020 at Unknown time  . levothyroxine (SYNTHROID) 50 MCG tablet Take 50 mcg by mouth daily before  breakfast.   05/04/2020 at Unknown time  . meclizine (ANTIVERT) 25 MG tablet Take 25 mg by mouth 3 (three) times daily as needed for dizziness.    01/14/2020  . mometasone (NASONEX) 50 MCG/ACT nasal spray Place 2 sprays into the nose daily.   05/04/2020 at Unknown time  . Multiple Vitamin (MULTIVITAMIN WITH MINERALS) TABS tablet Take 1 tablet by mouth daily.   04/20/2020  . Polyethyl Glycol-Propyl Glycol (SYSTANE) 0.4-0.3 % SOLN Place 1 drop into both eyes 3 (three) times daily as needed (dry/irritated eyes.).   05/03/2020 at Unknown time  . propranolol (INDERAL) 40 MG tablet Take 20 mg by mouth 3 (three) times daily.   05/04/2020 at 3.30 pm  . amoxicillin (AMOXIL) 500 MG capsule Take 500 mg by mouth as needed. Take medication  only before dental procedures.   05/01/2020  . ezetimibe (ZETIA) 10 MG tablet Take 10 mg by mouth daily.  (Patient not taking: No sig reported)       Review of Systems  Constitutional: Negative for decreased appetite, malaise/fatigue, weight gain and weight loss.  HENT: Negative for congestion.   Eyes: Negative for visual disturbance.  Cardiovascular: Positive for dyspnea on exertion. Negative for chest pain, leg swelling, palpitations and syncope.  Respiratory: Positive for shortness of breath. Negative for cough.   Endocrine: Negative for cold intolerance.  Hematologic/Lymphatic: Does not bruise/bleed easily.  Skin: Negative for itching and rash.  Musculoskeletal: Negative for myalgias.  Gastrointestinal: Negative for abdominal pain, nausea and vomiting.  Genitourinary: Negative for dysuria.  Neurological: Negative for dizziness and weakness.  Psychiatric/Behavioral: The patient is not nervous/anxious.   All other systems reviewed and are negative.     Physical Exam: Physical Exam Vitals and nursing note reviewed.  Constitutional:      General: She is not in acute distress.    Appearance: She is well-developed.  HENT:     Head: Normocephalic and atraumatic.   Eyes:     Conjunctiva/sclera: Conjunctivae normal.     Pupils: Pupils are equal, round, and reactive to light.  Neck:     Vascular: No JVD.  Cardiovascular:     Rate and Rhythm: Normal rate and regular rhythm.     Pulses: Normal pulses and intact distal pulses.          Carotid pulses are on the right side with bruit and on the left side with bruit.    Heart sounds: Murmur heard.   Harsh midsystolic murmur is present with a grade of 2/6 at the upper right sternal border radiating to the neck.   Pulmonary:     Effort: Pulmonary effort is normal. No tachypnea or accessory muscle usage.     Breath sounds: Normal breath sounds. No wheezing or rales.  Abdominal:     General: Bowel sounds are normal.     Palpations: Abdomen is soft.     Tenderness: There is no rebound.  Musculoskeletal:        General: No tenderness. Normal range of motion.     Right lower leg: No edema.     Left lower leg: No edema.  Lymphadenopathy:     Cervical: No cervical adenopathy.  Skin:    General: Skin is warm and dry.  Neurological:     Mental Status: She is alert and oriented to person, place, and time.     Cranial Nerves: No cranial nerve deficit.      Labs:   Lab Results  Component Value Date   WBC 9.9 05/04/2020   HGB 9.1 (L) 05/04/2020   HCT 29.2 (L) 05/04/2020   MCV 86.9 05/04/2020   PLT 361 05/04/2020    Recent Labs  Lab 05/06/20 0111  NA 135  K 3.8  CL 104  CO2 22  BUN 16  CREATININE 1.14*  CALCIUM 7.8*  PROT 5.0*  BILITOT 0.2*  ALKPHOS 180*  ALT 27  AST 41  GLUCOSE 95    Lipid Panel     Component Value Date/Time   CHOL 163 11/30/2019 1007   TRIG 83 11/30/2019 1007   HDL 62 11/30/2019 1007   CHOLHDL 3.5 06/25/2012 0625   VLDL 20 06/25/2012 0625   LDLCALC 85 11/30/2019 1007    BNP (last 3 results) Recent Labs    05/04/20 1724  BNP 94.1  HEMOGLOBIN A1C Lab Results  Component Value Date   HGBA1C 4.9 01/21/2019   MPG 93.93 01/21/2019    Cardiac Panel  (last 3 results) No results for input(s): CKTOTAL, CKMB, RELINDX in the last 8760 hours.  Invalid input(s): TROPONINHS  No results found for: CKTOTAL, CKMB, CKMBINDEX   TSH No results for input(s): TSH in the last 8760 hours.    Radiology: DG Chest 2 View  Result Date: 05/04/2020 CLINICAL DATA:  Short of breath EXAM: CHEST - 2 VIEW COMPARISON:  None 521 FINDINGS: Frontal and lateral views of the chest demonstrate stable aortic valve prosthesis. The cardiac silhouette is unremarkable. No acute airspace disease, effusion, or pneumothorax. Mild chronic interstitial scarring. Stable hiatal hernia. IMPRESSION: 1. No acute intrathoracic process. 2. Stable hiatal hernia. 3. Stable interstitial scarring. Electronically Signed   By: Randa Ngo M.D.   On: 05/04/2020 18:48   NM Pulmonary Perfusion  Result Date: 05/05/2020 CLINICAL DATA:  PE suspected, low to intermediate probability, positive D-dimer, history of CHF and mitral regurgitation EXAM: NUCLEAR MEDICINE PERFUSION LUNG SCAN TECHNIQUE: Perfusion images were obtained in multiple projections after intravenous injection of radiopharmaceutical. Ventilation scans intentionally deferred if perfusion scan and chest x-ray adequate for interpretation during COVID 19 epidemic. RADIOPHARMACEUTICALS:  4.1 mCi Tc-75mMAA IV COMPARISON:  Chest radiograph 05/04/2020 FINDINGS: No significant perfusion defects are identified on this perfusion only examination of the lungs. Review of the chest radiograph reveals some chronic interstitial scarring but otherwise clear lungs which could account for some minimal heterogeneity of distribution. IMPRESSION: Pulmonary embolism absent (normal or very low probability) per the modified perfusion-only PIOPED II criteria Reference: Sostman HD, Miniati M, GGayleen OremPD, PNew CentervilleSensitivity and specificity of perfusion scintigraphy combined with chest radiography for acute pulmonary embolism in PIOPED II.  (2008) Journal of nuclear medicine : official publication, Society of Nuclear Medicine. 49 (11): 1741-8. doi:10.2967/jnumed.1999-72-3118Electronically Signed   By: PLovena LeM.D.   On: 05/05/2020 01:00   ECHOCARDIOGRAM LIMITED  Result Date: 05/06/2020    ECHOCARDIOGRAM LIMITED REPORT   Patient Name:   [name] RITCHDate of Exam: 05/05/2020 Medical Rec #:  [id number]    Height:       60.0 in Accession #:    [id number]   Weight:       121.0 lb Date of Birth:  [birth year]    BSA:          1.508 m Patient Age:    89years      BP:           103/55 mmHg Patient Gender: F             HR:           80 bpm. Exam Location:  Inpatient Procedure: Limited Echo, Color Doppler and Limited Color Doppler Indications:     Dyspnea  History:         Patient has prior history of Echocardiogram examinations, most                  recent 02/15/2020. CAD, Prior TAVR, Aortic Valve Disease; Risk                  Factors:Hypertension and Dyslipidemia.  Sonographer:     MMaudry MayhewMHA, RDMS, RVT, RDCS Referring Phys:  [id number] PAHWANI Diagnosing Phys: MVernell LeepMD IMPRESSIONS  1. Left ventricular ejection fraction, by estimation, is 55 to 60%. The left ventricle has  normal function. The left ventricle has no regional wall motion abnormalities. There is mild left ventricular hypertrophy. Left ventricular diastolic function could not be evaluated.  2. Right ventricular systolic function is normal. The right ventricular size is normal. There is normal pulmonary artery systolic pressure.  3. Well seated 23 mm Evolute Pro TAVR valve. Mild paravalvular leak. Mean PG 14 mmhg only mildly higher than prior (9 mmHg). DVR of 0.44 suggests no significant aortic stenosis.  4. Mild mitral leaflet thickening. Trivial mitral regurgitation.  5. Mild to moderate tricuspid regurgitation. Estimated PASP 32 mmHg.  6. Mild pulmonic regurgitation.  7. Compared to previous outpatient study on 02/15/2020, no major chane noted. FINDINGS  Left  Ventricle: Left ventricular ejection fraction, by estimation, is 55 to 60%. The left ventricle has normal function. The left ventricle has no regional wall motion abnormalities. There is mild left ventricular hypertrophy. Left ventricular diastolic  function could not be evaluated. Right Ventricle: The right ventricular size is normal. No increase in right ventricular wall thickness. Right ventricular systolic function is normal. There is normal pulmonary artery systolic pressure. The tricuspid regurgitant velocity is 2.69 m/s, and  with an assumed right atrial pressure of 3 mmHg, the estimated right ventricular systolic pressure is 123XX123 mmHg. Left Atrium: Left atrial size was normal in size. Right Atrium: Right atrial size was normal in size. Mitral Valve: The mitral valve is abnormal. There is mild thickening of the mitral valve leaflet(s). Trivial mitral valve regurgitation. Tricuspid Valve: The tricuspid valve is grossly normal. Tricuspid valve regurgitation is mild to moderate. Aortic Valve: Aortic valve regurgitation Mild paravalvular leak. Aortic valve mean gradient measures 14.0 mmHg. Aortic valve peak gradient measures 25.8 mmHg. Aortic valve area, by VTI measures 0.91 cm. There is a 23 mm CoreValve-Evolut Pro prosthetic, stented (TAVR) valve present in the aortic position. Pulmonic Valve: The pulmonic valve was grossly normal. Pulmonic valve regurgitation is mild. Aorta: The aortic root and ascending aorta are structurally normal, with no evidence of dilitation. Venous: The inferior vena cava is normal in size with greater than 50% respiratory variability, suggesting right atrial pressure of 3 mmHg. IAS/Shunts: No atrial level shunt detected by color flow Doppler. LEFT VENTRICLE PLAX 2D LVIDd:         3.00 cm LVIDs:         2.10 cm LV PW:         0.70 cm LV IVS:        1.10 cm LVOT diam:     1.50 cm LV SV:         44 LV SV Index:   29 LVOT Area:     1.77 cm  LEFT ATRIUM             Index       RIGHT  ATRIUM          Index LA diam:        2.30 cm 1.53 cm/m  RA Area:     9.75 cm LA Vol (A2C):   12.6 ml 8.36 ml/m  RA Volume:   17.30 ml 11.47 ml/m LA Vol (A4C):   45.3 ml 30.05 ml/m LA Biplane Vol: 24.2 ml 16.05 ml/m  AORTIC VALVE AV Area (Vmax):    0.77 cm AV Area (Vmean):   0.76 cm AV Area (VTI):     0.91 cm AV Vmax:           254.00 cm/s AV Vmean:  179.000 cm/s AV VTI:            0.479 m AV Peak Grad:      25.8 mmHg AV Mean Grad:      14.0 mmHg LVOT Vmax:         111.00 cm/s LVOT Vmean:        77.400 cm/s LVOT VTI:          0.248 m LVOT/AV VTI ratio: 0.52 TRICUSPID VALVE TR Peak grad:   28.9 mmHg TR Vmax:        269.00 cm/s  SHUNTS Systemic VTI:  0.25 m Systemic Diam: 1.50 cm Vernell Leep MD Electronically signed by Vernell Leep MD Signature Date/Time: 05/06/2020/9:05:45 AM    Final     Scheduled Meds: . atorvastatin  10 mg Oral Daily  . calcium-vitamin D  0.5 tablet Oral Daily  . cholecalciferol  1,000 Units Oral Daily  . clopidogrel  75 mg Oral Daily  . enoxaparin (LOVENOX) injection  40 mg Subcutaneous Q24H  . ezetimibe  10 mg Oral Daily  . fluticasone  2 spray Each Nare Daily  . levothyroxine  50 mcg Oral Q0600  . loratadine  10 mg Oral Daily  . multivitamin with minerals  1 tablet Oral Daily  . propranolol  20 mg Oral TID   Continuous Infusions: . sodium chloride 1,000 mL (05/06/20 0638)   PRN Meds:.acetaminophen **OR** acetaminophen, meclizine, ondansetron **OR** ondansetron (ZOFRAN) IV, polyvinyl alcohol  CARDIAC STUDIES:  EKG 05/05/2020: Sinus rhythm 70 bpm.  Left bundle branch block.   CTA chest 03/05/2019: 1. Well positioned 23 mm Evolut Pro valve with normal appearing leaflets. Good coaptation with no HALT/HAM. Although target diameters for inflow, waist, outflow in systole are slightly low the mechanism of worsening PVL appears to be primarily a large area of calcification posterior to the stent valve from the native non coronary cusp. This area  of calcium measures 4.9 x 5.7 mm and is best seen on SA images at 7:00.  Echocardiogram 05/05/2020: 1. Left ventricular ejection fraction, by estimation, is 55 to 60%. The  left ventricle has normal function. The left ventricle has no regional  wall motion abnormalities. There is mild left ventricular hypertrophy.  Left ventricular diastolic function  could not be evaluated.  2. Right ventricular systolic function is normal. The right ventricular  size is normal. There is normal pulmonary artery systolic pressure.  3. Well seated 23 mm Evolute Pro TAVR valve. Mild paravalvular leak. Mean  PG 14 mmhg only mildly higher than prior (9 mmHg). DVR of 0.44 suggests no  significant aortic stenosis.  4. Mild mitral leaflet thickening. Trivial mitral regurgitation.  5. Mild to moderate tricuspid regurgitation. Estimated PASP 32 mmHg.  6. Mild pulmonic regurgitation.  7. Compared to previous outpatient study on 02/15/2020, no major change  noted.   01/26/2019 (Dr. Angelena Form):  Transcatheter Aortic Valve Replacement - Transfemoral Approach             [employer] Evolut Pro Plus THV (size 23 mm, model #[id number], serial # [id number])  Coronary intervention 01/05/2019: Successful orbital atherectomy followed by stenting of the proximal LAD with a 3.0 x 18 mm resolute Onyx DES and proximal segment postdilated with a 3.5 x 8 mm Sapphire [place]. Stenosis 90% to 0%. TIMI-3 to TIMI-3 flow. Orbital atherectomy followed by balloon angioplasty with 2.5 x 15 mm Sapphire balloon for D1, 90% to less than 15 to 20% stenosis. TIMI-3 to TIMI-3 flow.  Right and left heart catheterization 12/15/2018: RA  5/5, mean 5; RV 31/3, EDP 6; PA 32/14, mean 21, PA saturation 67%; PW 11/10, mean 10 mmHg. CO 3.84, CI 2.5756. QP/QS 1.0. Normal right heart catheterization. Mildly elevated LVEDP at 15 mmHg. Peak aortic valve gradient of 27, mean 23.6 mmHg with calculated aortic valve area 0.76 cm. Findings consistent with  severe calcific degenerative aortic valve stenosis. Left dominant circulation, proximal LAD 80% stenosis, large D1 with ostial 90% stenosis, medium sized RI with a proximal 90% stenosis, dominant circumflex mild disease. RCA mild disease and nondominant.    Assessment & Recommendations:  87 y.o. Caucasian female  with hypertension, mixed hyperlipidemia, coronary artery disease, severe aortic stenosis s/p 23 mm evolute pro TAVR 01/2019, mild stable paravalvular leak, now admitted with shortness of breath  Exertional dyspnea: Likely multifactorial.  I do not think she is in acute decompensated heart failure.  Differential diagnoses include paravalvular leak, coronary artery disease, pulmonary pathology, hiatal hernia  Paravalvular leak: Mild, stable since last study in January 2022.  Unlikely to be the sole cause of her profound exertional dyspnea and hypoxia.  Coronary artery disease: History of LAD stenting prior to TAVR.  Of note, patient never had chest pain even prior to her PCI.  This makes angina equivalent possible cause of her shortness of breath.  Recommend outpatient modified exercise nuclear stress test.  Will arrange.  Should patient stay inpatient until Monday 05/08/2020, could consider doing this inpatient. Continue current medical treatment, including Plavix, Pletal, Zetia. She is currently on propranolol, but no other beta-blocker. Should she have significant ischemia on stress test, will consider adding further antianginal therapy outpatient.  Pulmonary pathology: Patient has stable interstitial scarring on chest x-ray.  CT chest in 12/2019 also showed groundglass changes.  She has never had a pulmonary evaluation.  We will arrange outpatient pulmonary consultation, including PFTs.  Hiatal hernia: Asked but not the least, she does have large hiatal hernia could possibly be causing her exertional dyspnea.  Pulmonary function tests should show evidence of restrictive disease if  that is the case.  If cardiopulmonary work-up is unyielding, will consider referral for correction of hiatal hernia in future.  All of the above work-up can be performed outpatient.  I will defer to primary team regarding patient's discharge planning.  She has an elderly husband at home for whom she is the primary caretaker.  Patient has 1 daughter who lives in [place], 1 daughter lives in [place], and other daughter has her own medical problems and unable to care for the patient.  Consider evaluation for home health needs.      Nigel Mormon, MD Pager: [phone] Office: [phone]

## 2020-05-07 ENCOUNTER — Other Ambulatory Visit: Payer: Self-pay | Admitting: Cardiology

## 2020-05-07 DIAGNOSIS — R0600 Dyspnea, unspecified: Secondary | ICD-10-CM

## 2020-05-07 DIAGNOSIS — J9601 Acute respiratory failure with hypoxia: Secondary | ICD-10-CM | POA: Diagnosis not present

## 2020-05-07 DIAGNOSIS — R0609 Other forms of dyspnea: Secondary | ICD-10-CM

## 2020-05-07 MED ORDER — FUROSEMIDE 10 MG/ML IJ SOLN
20.0000 mg | Freq: Once | INTRAMUSCULAR | Status: AC
  Administered 2020-05-07: 20 mg via INTRAVENOUS
  Filled 2020-05-07: qty 2

## 2020-05-07 NOTE — Discharge Summary (Signed)
Physician Discharge Summary  [name] [name] [id number] DOB: 12-Mar-1931 DOA: 05/04/2020  PCP: Kelton Pillar, MD  Admit date: 05/04/2020 Discharge date: 05/07/2020    Admitted From: Home Disposition: Home  Recommendations for Outpatient Follow-up:  1. Follow up with PCP in 1-2 weeks 2. Follow-up with cardiology as recommended by Dr. Virgina Jock and then follow his recommendations about follow-up with pulmonology for PFTs and general surgery for hiatal hernia 3. Please obtain BMP/CBC in one week 4. Please follow up with your PCP on the following pending results: Unresulted Labs (From admission, onward)         None        Home Health: None Equipment/Devices: None  Discharge Condition: Stable CODE STATUS: None Diet recommendation: Cardiac  Subjective: Seen and examined.  No new complaint other than exertional dyspnea which is no better or worse.  Brief/Interim Summary: [name] [name] is a 87 y.o. 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  Associated fatigue.  SOB is worse with exertion, better at rest.  No orthopnea. Reportedly she had low grade temp and chills recently, neg home COVID test.  No edema.  Reportedly when she arrived to the ED, she was slightly hypoxic with ambulation requiring 1 to 2 L of oxygen but that was taken off as soon as she rested.  She had elevated D-dimer for which she underwent VQ scan which was negative for PE.  Hypokalemia was replaced.  She was admitted under hospital service.  Since admission, patient never had any hypoxia noted although she continued to complain of exertional dyspnea but none at rest.  Her symptoms sounded more like angina  equivalent so cardiology was consulted.  She was very well known to the group of cardiology.  Per cardiology note, her symptoms could be multifactorial but very well be angina equivalent.  They recommended nuclear stress test and they will be arranging that outpatient.  If that is ruled out, they are planning for the patient to have follow-up with pulmonology for formal PFTs.  Patient also has hiatal hernia.  If pulmonary and cardiac causes are ruled out of her symptoms then cardiology will also arrange follow-up outpatient with general surgery for hiatal hernia treatment.  All of this was discussed with patient's daughter at bedside by Dr. Virgina Jock yesterday.  After this, daughter has requested that home meds should be arranged for patient and ambulatory oximetry should be checked as she had thought that patient is going to require oxygen.  Patient was evaluated by PT OT and they did not recommend any home health PT OT.  Ambulatory oximetry was checked and patient did not seem to require any oxygen at rest or with exertion.  Patient remained hemodynamically stable.  Daughter was also concerned about possible sinusitis causing symptoms of dyspnea.  Patient did not have any symptoms of acute sinusitis and no signs either on physical examination.  Daughter was reassured that patient does not seem to have acute sinusitis.  However patient could very well follow-up with ENT as she may be having chronic sinusitis.  At this point in time, patient is stable and thus she is going to be discharged home.  Discharge Diagnoses:  Principal Problem:   Acute respiratory failure with hypoxia (HCC) Active Problems:   HTN (hypertension)   Dyspnea on exertion    Discharge Instructions   Allergies as of 05/07/2020      Reactions   Aspirin Anaphylaxis   Bee Venom Anaphylaxis   Ceclor [cefaclor] Anaphylaxis   Pneumococcal Vac Polyvalent    Other reaction(s): swelling and erythema   Statins Other (See Comments)    Elevated liver enzymes   Penicillins Rash   Has patient had a PCN reaction causing immediate rash, facial/tongue/throat swelling, SOB or lightheadedness with hypotension: Yes Has patient had a PCN reaction causing severe rash involving mucus membranes or skin necrosis: No Has patient had a PCN reaction that required hospitalization No Has patient had a PCN reaction occurring within the last 10 years: No If all of the above answers are "NO", then may proceed with Cephalosporin use.   Sulfa Antibiotics Rash      Medication List    TAKE these medications   amoxicillin 500 MG capsule Commonly known as: AMOXIL Take 500 mg by mouth as needed. Take medication only before dental procedures.   atorvastatin 10 MG tablet Commonly known as: LIPITOR Take 1 tablet (10 mg total) by mouth daily.   calcium-vitamin D 250-125 MG-UNIT tablet Commonly known as: OSCAL WITH D Take 1 tablet by mouth daily.   cetirizine 10 MG tablet Commonly known as: ZYRTEC Take 10 mg by mouth daily.   cholecalciferol 25 MCG (1000 UNIT) tablet Commonly known as: VITAMIN D Take 1,000 Units by mouth daily.   clopidogrel 75 MG tablet Commonly known as: PLAVIX TAKE 1 TABLET BY MOUTH EVERY DAY   ezetimibe 10 MG tablet Commonly known as: ZETIA Take 10 mg by mouth daily.   furosemide 20 MG tablet Commonly known as: LASIX Take 1 tablet (20 mg total) by mouth 2 (two) times daily as needed for fluid (am and 3 pm for 3 days then as needed for shortness of breath). What changed: when to take this   levothyroxine 50 MCG tablet Commonly known as: SYNTHROID Take 50 mcg by mouth daily before breakfast.   meclizine 25 MG tablet Commonly known as: ANTIVERT Take 25 mg by mouth 3 (three) times daily as needed for dizziness.   mometasone 50 MCG/ACT nasal spray Commonly known as: NASONEX Place 2 sprays into the nose daily.   multivitamin with minerals Tabs tablet Take 1 tablet by mouth daily.   propranolol 40 MG  tablet Commonly known as: INDERAL Take 20 mg by mouth 3 (three) times daily.   Systane 0.4-0.3 % Soln Generic drug: Polyethyl Glycol-Propyl Glycol Place 1 drop into both eyes 3 (three) times daily as needed (dry/irritated eyes.).       Follow-up Information    Kelton Pillar, MD Follow up in 1 week(s).   Specialty: Family Medicine Contact information: [address] [place] [address] [phone]        Adrian Prows, MD .   Specialty: Cardiology Contact information: [place] [address] [phone]        Burnell Blanks, MD .   Specialty: Cardiology Contact information: [address] [place] [address] [phone]              Allergies  Allergen Reactions  . Aspirin Anaphylaxis  . Bee Venom Anaphylaxis  . Ceclor [Cefaclor] Anaphylaxis  . Pneumococcal Vac Polyvalent     Other reaction(s): swelling and erythema  .  Statins Other (See Comments)    Elevated liver enzymes  . Penicillins Rash    Has patient had a PCN reaction causing immediate rash, facial/tongue/throat swelling, SOB or lightheadedness with hypotension: Yes Has patient had a PCN reaction causing severe rash involving mucus membranes or skin necrosis: No Has patient had a PCN reaction that required hospitalization No Has patient had a PCN reaction occurring within the last 10 years: No If all of the above answers are "NO", then may proceed with Cephalosporin use.  . Sulfa Antibiotics Rash    Consultations: Cardiology   Procedures/Studies: DG Chest 2 View  Result Date: 05/04/2020 CLINICAL DATA:  Short of breath EXAM: CHEST - 2 VIEW COMPARISON:  None 521 FINDINGS: Frontal and lateral views of the chest demonstrate stable aortic valve prosthesis. The cardiac silhouette is unremarkable. No acute airspace disease, effusion, or pneumothorax. Mild chronic interstitial scarring. Stable hiatal hernia. IMPRESSION: 1. No acute  intrathoracic process. 2. Stable hiatal hernia. 3. Stable interstitial scarring. Electronically Signed   By: Randa Ngo M.D.   On: 05/04/2020 18:48   NM Pulmonary Perfusion  Result Date: 05/05/2020 CLINICAL DATA:  PE suspected, low to intermediate probability, positive D-dimer, history of CHF and mitral regurgitation EXAM: NUCLEAR MEDICINE PERFUSION LUNG SCAN TECHNIQUE: Perfusion images were obtained in multiple projections after intravenous injection of radiopharmaceutical. Ventilation scans intentionally deferred if perfusion scan and chest x-ray adequate for interpretation during COVID 19 epidemic. RADIOPHARMACEUTICALS:  4.1 mCi Tc-31mMAA IV COMPARISON:  Chest radiograph 05/04/2020 FINDINGS: No significant perfusion defects are identified on this perfusion only examination of the lungs. Review of the chest radiograph reveals some chronic interstitial scarring but otherwise clear lungs which could account for some minimal heterogeneity of distribution. IMPRESSION: Pulmonary embolism absent (normal or very low probability) per the modified perfusion-only PIOPED II criteria Reference: Sostman HD, Miniati M, GGayleen OremPD, PNew GermanySensitivity and specificity of perfusion scintigraphy combined with chest radiography for acute pulmonary embolism in PIOPED II. (2008) Journal of nuclear medicine : official publication, Society of Nuclear Medicine. 49 (11): 1741-8. doi:10.2967/jnumed.1999-72-3118Electronically Signed   By: PLovena LeM.D.   On: 05/05/2020 01:00   ECHOCARDIOGRAM LIMITED  Result Date: 05/06/2020    ECHOCARDIOGRAM LIMITED REPORT   Patient Name:   [name] KAMADADate of Exam: 05/05/2020 Medical Rec #:  [id number]    Height:       60.0 in Accession #:    [id number]   Weight:       121.0 lb Date of Birth:  9Jul 10, 1933    BSA:          1.508 m Patient Age:    89years      BP:           103/55 mmHg Patient Gender: F             HR:           80 bpm. Exam Location:  Inpatient  Procedure: Limited Echo, Color Doppler and Limited Color Doppler Indications:     Dyspnea  History:         Patient has prior history of Echocardiogram examinations, most                  recent 02/15/2020. CAD, Prior TAVR, Aortic Valve Disease; Risk                  Factors:Hypertension and Dyslipidemia.  Sonographer:     MSharyn Lull  Simonetti MHA, RDMS, RVT, RDCS Referring Phys:  [id number] Jasilyn Holderman Diagnosing Phys: Vernell Leep MD IMPRESSIONS  1. Left ventricular ejection fraction, by estimation, is 55 to 60%. The left ventricle has normal function. The left ventricle has no regional wall motion abnormalities. There is mild left ventricular hypertrophy. Left ventricular diastolic function could not be evaluated.  2. Right ventricular systolic function is normal. The right ventricular size is normal. There is normal pulmonary artery systolic pressure.  3. Well seated 23 mm Evolute Pro TAVR valve. Mild paravalvular leak. Mean PG 14 mmhg only mildly higher than prior (9 mmHg). DVR of 0.44 suggests no significant aortic stenosis.  4. Mild mitral leaflet thickening. Trivial mitral regurgitation.  5. Mild to moderate tricuspid regurgitation. Estimated PASP 32 mmHg.  6. Mild pulmonic regurgitation.  7. Compared to previous outpatient study on 02/15/2020, no major chane noted. FINDINGS  Left Ventricle: Left ventricular ejection fraction, by estimation, is 55 to 60%. The left ventricle has normal function. The left ventricle has no regional wall motion abnormalities. There is mild left ventricular hypertrophy. Left ventricular diastolic  function could not be evaluated. Right Ventricle: The right ventricular size is normal. No increase in right ventricular wall thickness. Right ventricular systolic function is normal. There is normal pulmonary artery systolic pressure. The tricuspid regurgitant velocity is 2.69 m/s, and  with an assumed right atrial pressure of 3 mmHg, the estimated right ventricular systolic pressure is  123XX123 mmHg. Left Atrium: Left atrial size was normal in size. Right Atrium: Right atrial size was normal in size. Mitral Valve: The mitral valve is abnormal. There is mild thickening of the mitral valve leaflet(s). Trivial mitral valve regurgitation. Tricuspid Valve: The tricuspid valve is grossly normal. Tricuspid valve regurgitation is mild to moderate. Aortic Valve: Aortic valve regurgitation Mild paravalvular leak. Aortic valve mean gradient measures 14.0 mmHg. Aortic valve peak gradient measures 25.8 mmHg. Aortic valve area, by VTI measures 0.91 cm. There is a 23 mm CoreValve-Evolut Pro prosthetic, stented (TAVR) valve present in the aortic position. Pulmonic Valve: The pulmonic valve was grossly normal. Pulmonic valve regurgitation is mild. Aorta: The aortic root and ascending aorta are structurally normal, with no evidence of dilitation. Venous: The inferior vena cava is normal in size with greater than 50% respiratory variability, suggesting right atrial pressure of 3 mmHg. IAS/Shunts: No atrial level shunt detected by color flow Doppler. LEFT VENTRICLE PLAX 2D LVIDd:         3.00 cm LVIDs:         2.10 cm LV PW:         0.70 cm LV IVS:        1.10 cm LVOT diam:     1.50 cm LV SV:         44 LV SV Index:   29 LVOT Area:     1.77 cm  LEFT ATRIUM             Index       RIGHT ATRIUM          Index LA diam:        2.30 cm 1.53 cm/m  RA Area:     9.75 cm LA Vol (A2C):   12.6 ml 8.36 ml/m  RA Volume:   17.30 ml 11.47 ml/m LA Vol (A4C):   45.3 ml 30.05 ml/m LA Biplane Vol: 24.2 ml 16.05 ml/m  AORTIC VALVE AV Area (Vmax):    0.77 cm AV Area (Vmean):   0.76 cm AV Area (  VTI):     0.91 cm AV Vmax:           254.00 cm/s AV Vmean:          179.000 cm/s AV VTI:            0.479 m AV Peak Grad:      25.8 mmHg AV Mean Grad:      14.0 mmHg LVOT Vmax:         111.00 cm/s LVOT Vmean:        77.400 cm/s LVOT VTI:          0.248 m LVOT/AV VTI ratio: 0.52 TRICUSPID VALVE TR Peak grad:   28.9 mmHg TR Vmax:        269.00  cm/s  SHUNTS Systemic VTI:  0.25 m Systemic Diam: 1.50 cm Vernell Leep MD Electronically signed by Vernell Leep MD Signature Date/Time: 05/06/2020/9:05:45 AM    Final       Discharge Exam: Vitals:   05/06/20 2159 05/07/20 0451  BP: 121/66 109/60  Pulse: 72 78  Resp: 16 16  Temp: 98.9 F (37.2 C) 98.3 F (36.8 C)  SpO2: 98% 95%   Vitals:   05/06/20 1433 05/06/20 1500 05/06/20 2159 05/07/20 0451  BP: (!) 90/54  121/66 109/60  Pulse: 84  72 78  Resp: '17  16 16  '$ Temp: 98 F (36.7 C)  98.9 F (37.2 C) 98.3 F (36.8 C)  TempSrc:   Oral Oral  SpO2: 100% 92% 98% 95%    General: Pt is alert, awake, not in acute distress Cardiovascular: RRR, S1/S2 +, no rubs, no gallops Respiratory: CTA bilaterally, no wheezing, no rhonchi Abdominal: Soft, NT, ND, bowel sounds + Extremities: no edema, no cyanosis    The results of significant diagnostics from this hospitalization (including imaging, microbiology, ancillary and laboratory) are listed below for reference.     Microbiology: Recent Results (from the past 240 hour(s))  Resp Panel by RT-PCR (Flu A&B, Covid) Nasopharyngeal Swab     Status: None   Collection Time: 05/05/20  1:07 AM   Specimen: Nasopharyngeal Swab; Nasopharyngeal(NP) swabs in vial transport medium  Result Value Ref Range Status   SARS Coronavirus 2 by RT PCR NEGATIVE NEGATIVE Final    Comment: (NOTE) SARS-CoV-2 target nucleic acids are NOT DETECTED.  The SARS-CoV-2 RNA is generally detectable in upper respiratory specimens during the acute phase of infection. The lowest concentration of SARS-CoV-2 viral copies this assay can detect is 138 copies/mL. A negative result does not preclude SARS-Cov-2 infection and should not be used as the sole basis for treatment or other patient management decisions. A negative result may occur with  improper specimen collection/handling, submission of specimen other than nasopharyngeal swab, presence of viral mutation(s)  within the areas targeted by this assay, and inadequate number of viral copies(<138 copies/mL). A negative result must be combined with clinical observations, patient history, and epidemiological information. The expected result is Negative.  Fact Sheet for Patients:  EntrepreneurPulse.com.au  Fact Sheet for Healthcare Providers:  IncredibleEmployment.be  This test is no t yet approved or cleared by the [place] FDA and  has been authorized for detection and/or diagnosis of SARS-CoV-2 by FDA under an Emergency Use Authorization (EUA). This EUA will remain  in effect (meaning this test can be used) for the duration of the COVID-19 declaration under Section 564(b)(1) of the Act, 21 U.S.C.section 360bbb-3(b)(1), unless the authorization is terminated  or revoked sooner.       Influenza A  by PCR NEGATIVE NEGATIVE Final   Influenza B by PCR NEGATIVE NEGATIVE Final    Comment: (NOTE) The Xpert Xpress SARS-CoV-2/FLU/RSV plus assay is intended as an aid in the diagnosis of influenza from Nasopharyngeal swab specimens and should not be used as a sole basis for treatment. Nasal washings and aspirates are unacceptable for Xpert Xpress SARS-CoV-2/FLU/RSV testing.  Fact Sheet for Patients: EntrepreneurPulse.com.au  Fact Sheet for Healthcare Providers: IncredibleEmployment.be  This test is not yet approved or cleared by the [place] FDA and has been authorized for detection and/or diagnosis of SARS-CoV-2 by FDA under an Emergency Use Authorization (EUA). This EUA will remain in effect (meaning this test can be used) for the duration of the COVID-19 declaration under Section 564(b)(1) of the Act, 21 U.S.C. section 360bbb-3(b)(1), unless the authorization is terminated or revoked.  Performed at [facility], [place] [address], [place], [place] [address]      Labs: BNP (last 3 results) Recent Labs     05/04/20 1724  BNP [id number]   Basic Metabolic Panel: Recent Labs  Lab 05/04/20 1724 05/05/20 0650 05/06/20 0111  NA 127* 134* 135  K 2.9* 3.7 3.8  CL 87* 101 104  CO2 '30 29 22  '$ GLUCOSE 116* 97 95  BUN 30* 21 16  CREATININE 1.53* 1.32* 1.14*  CALCIUM 8.5* 7.6* 7.8*   Liver Function Tests: Recent Labs  Lab 05/04/20 1724 05/06/20 0111  AST 41 41  ALT 30 27  ALKPHOS 229* 180*  BILITOT 0.5 0.2*  PROT 6.5 5.0*  ALBUMIN 3.1* 2.3*   No results for input(s): LIPASE, AMYLASE in the last 168 hours. No results for input(s): AMMONIA in the last 168 hours. CBC: Recent Labs  Lab 05/04/20 1724  WBC 9.9  NEUTROABS 5.4  HGB 9.1*  HCT 29.2*  MCV 86.9  PLT 361   Cardiac Enzymes: No results for input(s): CKTOTAL, CKMB, CKMBINDEX, TROPONINI in the last 168 hours. BNP: Invalid input(s): POCBNP CBG: Recent Labs  Lab 05/05/20 2118  GLUCAP 140*   D-Dimer Recent Labs    05/04/20 1945  DDIMER 1.47*   Hgb A1c No results for input(s): HGBA1C in the last 72 hours. Lipid Profile No results for input(s): CHOL, HDL, LDLCALC, TRIG, CHOLHDL, LDLDIRECT in the last 72 hours. Thyroid function studies No results for input(s): TSH, T4TOTAL, T3FREE, THYROIDAB in the last 72 hours.  Invalid input(s): FREET3 Anemia work up No results for input(s): VITAMINB12, FOLATE, FERRITIN, TIBC, IRON, RETICCTPCT in the last 72 hours. Urinalysis    Component Value Date/Time   COLORURINE STRAW (A) 05/04/2020 1900   APPEARANCEUR CLEAR 05/04/2020 1900   LABSPEC 1.005 05/04/2020 1900   PHURINE 6.0 05/04/2020 1900   GLUCOSEU NEGATIVE 05/04/2020 1900   HGBUR NEGATIVE 05/04/2020 1900   BILIRUBINUR NEGATIVE 05/04/2020 1900   KETONESUR NEGATIVE 05/04/2020 1900   PROTEINUR NEGATIVE 05/04/2020 1900   UROBILINOGEN 0.2 08/23/2018 1044   NITRITE NEGATIVE 05/04/2020 1900   LEUKOCYTESUR NEGATIVE 05/04/2020 1900   Sepsis Labs Invalid input(s): PROCALCITONIN,  WBC,  LACTICIDVEN Microbiology Recent Results  (from the past 240 hour(s))  Resp Panel by RT-PCR (Flu A&B, Covid) Nasopharyngeal Swab     Status: None   Collection Time: 05/05/20  1:07 AM   Specimen: Nasopharyngeal Swab; Nasopharyngeal(NP) swabs in vial transport medium  Result Value Ref Range Status   SARS Coronavirus 2 by RT PCR NEGATIVE NEGATIVE Final    Comment: (NOTE) SARS-CoV-2 target nucleic acids are NOT DETECTED.  The SARS-CoV-2 RNA is generally  detectable in upper respiratory specimens during the acute phase of infection. The lowest concentration of SARS-CoV-2 viral copies this assay can detect is 138 copies/mL. A negative result does not preclude SARS-Cov-2 infection and should not be used as the sole basis for treatment or other patient management decisions. A negative result may occur with  improper specimen collection/handling, submission of specimen other than nasopharyngeal swab, presence of viral mutation(s) within the areas targeted by this assay, and inadequate number of viral copies(<138 copies/mL). A negative result must be combined with clinical observations, patient history, and epidemiological information. The expected result is Negative.  Fact Sheet for Patients:  EntrepreneurPulse.com.au  Fact Sheet for Healthcare Providers:  IncredibleEmployment.be  This test is no t yet approved or cleared by the [place] FDA and  has been authorized for detection and/or diagnosis of SARS-CoV-2 by FDA under an Emergency Use Authorization (EUA). This EUA will remain  in effect (meaning this test can be used) for the duration of the COVID-19 declaration under Section 564(b)(1) of the Act, 21 U.S.C.section 360bbb-3(b)(1), unless the authorization is terminated  or revoked sooner.       Influenza A by PCR NEGATIVE NEGATIVE Final   Influenza B by PCR NEGATIVE NEGATIVE Final    Comment: (NOTE) The Xpert Xpress SARS-CoV-2/FLU/RSV plus assay is intended as an aid in the  diagnosis of influenza from Nasopharyngeal swab specimens and should not be used as a sole basis for treatment. Nasal washings and aspirates are unacceptable for Xpert Xpress SARS-CoV-2/FLU/RSV testing.  Fact Sheet for Patients: EntrepreneurPulse.com.au  Fact Sheet for Healthcare Providers: IncredibleEmployment.be  This test is not yet approved or cleared by the [place] FDA and has been authorized for detection and/or diagnosis of SARS-CoV-2 by FDA under an Emergency Use Authorization (EUA). This EUA will remain in effect (meaning this test can be used) for the duration of the COVID-19 declaration under Section 564(b)(1) of the Act, 21 U.S.C. section 360bbb-3(b)(1), unless the authorization is terminated or revoked.  Performed at [facility], [place] [address] [place], [place] [address]      Time coordinating discharge: Over 30 minutes  SIGNED:   Darliss Cheney, MD  [facility] 05/07/2020, 10:26 AM  If 7PM-7AM, please contact night-coverage [email or web address]

## 2020-05-07 NOTE — Progress Notes (Signed)
Patient Saturations on Room Air at Rest = 96%  Patient Saturations on [employer] while Ambulating = 96%  Patient Saturations on 1 Liters of oxygen while Ambulating =100 %

## 2020-05-07 NOTE — Plan of Care (Signed)

## 2020-05-07 NOTE — Progress Notes (Signed)
Mild leg edema this morning. Added Iv lasix 20 mg to be given before discharge.   Vernell Leep, MD

## 2020-05-07 NOTE — Progress Notes (Signed)
CSW met with pt, who had questions regarding how to get a [facility] aide set up.  Pt reports she and her husband both have long term care insurance.  CSW provided contact information for [email or web address] website and discussed with pt how to locate local providers for [facility] aide services, which should be covered by the LTC insurance.  No recommendation for [facility] services.  Pt verbalized understanding and will follow up tomorrow. Lurline Idol, MSW, LCSW 4/17/202211:36 AM

## 2020-05-07 NOTE — Discharge Instructions (Signed)

## 2020-05-08 ENCOUNTER — Telehealth: Payer: Self-pay | Admitting: Pulmonary Disease

## 2020-05-08 NOTE — Telephone Encounter (Signed)
Spoke with patient, received permission to speak with her daughter who is a Marine scientist.  She has been waiting for a pulmonary consult for some time and was trying to get her in prior to her stress test on Monday 05/15/20.  She was recently in the hospital for 5 days.  In the ER her sats dropped to 81% walking down the hallway.  She spent 5 days in the bed.  She was given IV lasix times 1 on the day of d/c d/t edema in her feet and had not received a dose of lasix during her hospital stay.  She usually take the lasix every 3 days.  When she returned home, she was up 10 lbs.  She is now down 5 lbs.  She is still very sob.  She is seeing an ENT tomorrow at 1:30 pm d/t some sinus issues.  She has a hx of asthma during early adulthood and during pregnancy.  Currently her problem is emphysema.  Scheduled patient to see Dr. Loanne Drilling on 05/10/20 at 11 am, advised to arrive at 10:45 am to fill out new patient forms.  She was previously seen by Dr. Melvyn Novas.  Dr. Melvyn Novas had nothing sooner. She was scheduled to see Dr. Erin Fulling on 05/18/20 and did not have anything sooner.  Nothing further needed.  FYI sent to Dr. Loanne Drilling.  Dr. Loanne Drilling, I added this patient as a consult to your schedule on 05/10/20 for emphysema and exertional sob.  She was d/c'd from the hospital on 05/07/20 and daughter would like her seen prior to her stress test on Monday 05/15/20.  This is just an [place].  Thank you.

## 2020-05-08 NOTE — Telephone Encounter (Signed)
Called and spoke with pt's daughter Amy letting her know the info stated by Dr. Loanne Drilling and she verbalizsed understanding. Nothing further needed.

## 2020-05-08 NOTE — Telephone Encounter (Signed)
On review of the discharge summary from 05/07/20, ambulatory O2 was performed prior to patient's discharge and she did not need require oxygen at that time.  Ok to wait for patient to be seen in clinic with me for consultation and further evaluation on 05/10/20. Will plan for repeat ambulatory O2 during this visit.  Rodman Pickle, M.D. [facility] Pulmonary/Critical Care Medicine 05/08/2020 3:41 PM

## 2020-05-08 NOTE — Telephone Encounter (Signed)
Pts daughter stated that she was released from the hospital yesterday on 05/07/2020, pts daughter stated that she has no O2 and her SPO2 dropped to 81% just from walking down the hallway in the hospital states that she is in desperate need of an oxygen supply and seeing a doctor. Did arrange a consult with Dr. Erin Fulling for 05/18/2020 being that this was the first available. Pts daughter is very concerned and wanting to see if there is anything that can be done. The pt did see Dr. Melvyn Novas in 2017. Pts daughter is wanting to be called on either [phone] or [phone]

## 2020-05-10 ENCOUNTER — Ambulatory Visit: Payer: Federal, State, Local not specified - PPO | Admitting: Pulmonary Disease

## 2020-05-10 ENCOUNTER — Telehealth: Payer: Self-pay | Admitting: Pulmonary Disease

## 2020-05-10 ENCOUNTER — Encounter: Payer: Self-pay | Admitting: Pulmonary Disease

## 2020-05-10 ENCOUNTER — Other Ambulatory Visit: Payer: Self-pay

## 2020-05-10 VITALS — BP 110/62 | HR 78 | Temp 98.0°F | Ht 60.0 in | Wt 118.0 lb

## 2020-05-10 DIAGNOSIS — J432 Centrilobular emphysema: Secondary | ICD-10-CM

## 2020-05-10 DIAGNOSIS — R0602 Shortness of breath: Secondary | ICD-10-CM

## 2020-05-10 MED ORDER — MONTELUKAST SODIUM 10 MG PO TABS: 10.0000 mg | ORAL_TABLET | Freq: Every day | ORAL | 6 refills | Status: DC

## 2020-05-10 MED ORDER — SPIRIVA RESPIMAT 2.5 MCG/ACT IN AERS: 2.0000 | INHALATION_SPRAY | Freq: Every day | RESPIRATORY_TRACT | 3 refills | Status: DC

## 2020-05-10 MED ORDER — SPIRIVA RESPIMAT 2.5 MCG/ACT IN AERS: 2.0000 | INHALATION_SPRAY | Freq: Every day | RESPIRATORY_TRACT | 0 refills | Status: DC

## 2020-05-10 NOTE — Telephone Encounter (Signed)
ATC pt x 2. Line would ring then disconnect. WCB.

## 2020-05-10 NOTE — Progress Notes (Signed)
Subjective:   PATIENT ID: [name] [name] GENDER: female DOB: 02-13-1931, MRN: [id number]   HPI  Chief Complaint  Patient presents with  . Follow-up    Sob with exertion.     Reason for Visit: New consult for shortness of breath  [name] [name] is a 87 year old female never smoker with emphysema, CAD s/p stent, AVS s/pt TAVR 01/2019, pulmonary nodules who presents as a new consultation for shortness of breath. Daughter is present.   She was referred to Pulmonary after being discharged from the hospital last week. She was hospitalized from 05/04/20-05/07/20. Per review of discharge note she required 1-2L O2 in the ED. V/Q scan was negative for PE. While inpatient she was diuresed. Cardiology was consulted and plan for outpatient nuclear stress test. She was also referred to Pulmonary to consider PFT for evaluation of her emphysema. At time of discharge she did not require any oxygen. She has been on lasix and has noticed weight gain of 10lbs post-discharge. This morning is 116lb, baseline is 113-115lb.  She reports childhood asthma and respiratory complications during pregnancy in her 20-30s. She was on a nebulizer 2-3 x daily in her 50-60s and needing steroids at least twice a year. However in her 70s-80s she has not had any issues or needing any medications for her asthma and this may have been related from removing environmental issues (older home, dust, tobacco farms, gardening)  She has had multiple sinus surgery x 4 including for nasal polyp removal. She was seen by ENT yesterday. She takes zyrtec and saline rinses. She is scheduled for stress test next week with [facility] Cardiology.  Social History: Never smoker Her mother was diagnosed with tuberculosis [place] burning stove  Environmental exposures:   I have personally reviewed patient's past medical/family/social history, allergies, current medications.  Past Medical History:  Diagnosis Date  . Arthritis   . Asthma     history of  . CAD (coronary artery disease)   . Dyspnea    sometimes sitting.lying and on exertion  . GERD (gastroesophageal reflux disease)   . History of hiatal hernia   . History of transcatheter aortic valve replacement (TAVR) 01/26/2019   (TAVR  23 mm Evolute Pro in aortic position 01/26/2019) .   Marland Kitchen Hyperlipidemia   . Hypertension   . Hypothyroidism   . Lipoma    left arm  . Nasal polyps   . Pneumonia   . Pulmonary nodule    noted on pre TAVR CT  . Severe aortic stenosis      Family History  Problem Relation Age of Onset  . Tuberculosis Mother   . Other Father        Bronchiectasis  . Cancer Maternal Grandmother        ovarian  . Sarcoidosis Sister   . Asthma Maternal Grandfather   . Heart disease Brother   . Heart attack Brother   . Breast cancer Daughter      Social History   Occupational History  . Not on file  Tobacco Use  . Smoking status: Never Smoker  . Smokeless tobacco: Never Used  Vaping Use  . Vaping Use: Never used  Substance and Sexual Activity  . Alcohol use: No  . Drug use: No  . Sexual activity: Not on file    Allergies  Allergen Reactions  . Aspirin Anaphylaxis  . Bee Venom Anaphylaxis  . Ceclor [Cefaclor] Anaphylaxis  . Pneumococcal Vac Polyvalent     Other reaction(s):  swelling and erythema  . Statins Other (See Comments)    Elevated liver enzymes  . Penicillins Rash    Has patient had a PCN reaction causing immediate rash, facial/tongue/throat swelling, SOB or lightheadedness with hypotension: Yes Has patient had a PCN reaction causing severe rash involving mucus membranes or skin necrosis: No Has patient had a PCN reaction that required hospitalization No Has patient had a PCN reaction occurring within the last 10 years: No If all of the above answers are "NO", then may proceed with Cephalosporin use.  . Sulfa Antibiotics Rash     Outpatient Medications Prior to Visit  Medication Sig Dispense Refill  . atorvastatin (LIPITOR)  10 MG tablet Take 1 tablet (10 mg total) by mouth daily. 90 tablet 3  . calcium-vitamin D (OSCAL WITH D) 250-125 MG-UNIT tablet Take 1 tablet by mouth daily.    . cholecalciferol (VITAMIN D) 25 MCG (1000 UT) tablet Take 1,000 Units by mouth daily.    . clopidogrel (PLAVIX) 75 MG tablet TAKE 1 TABLET BY MOUTH EVERY DAY (Patient taking differently: Take 75 mg by mouth daily.) 90 tablet 1  . furosemide (LASIX) 20 MG tablet Take 1 tablet (20 mg total) by mouth 2 (two) times daily as needed for fluid (am and 3 pm for 3 days then as needed for shortness of breath). (Patient taking differently: Take 20 mg by mouth daily.) 60 tablet 2  . levothyroxine (SYNTHROID) 50 MCG tablet Take 50 mcg by mouth daily before breakfast.    . mometasone (NASONEX) 50 MCG/ACT nasal spray Place 2 sprays into the nose daily.    . Multiple Vitamin (MULTIVITAMIN WITH MINERALS) TABS tablet Take 1 tablet by mouth daily.    Vladimir Faster Glycol-Propyl Glycol (SYSTANE) 0.4-0.3 % SOLN Place 1 drop into both eyes 3 (three) times daily as needed (dry/irritated eyes.).    Marland Kitchen propranolol (INDERAL) 40 MG tablet Take 20 mg by mouth 3 (three) times daily.    Marland Kitchen amoxicillin (AMOXIL) 500 MG capsule Take 500 mg by mouth as needed. Take medication only before dental procedures. (Patient not taking: Reported on 05/10/2020)    . cetirizine (ZYRTEC) 10 MG tablet Take 10 mg by mouth daily. (Patient not taking: Reported on 05/10/2020)    . ezetimibe (ZETIA) 10 MG tablet Take 10 mg by mouth daily.  (Patient not taking: Reported on 05/10/2020)    . meclizine (ANTIVERT) 25 MG tablet Take 25 mg by mouth 3 (three) times daily as needed for dizziness.  (Patient not taking: Reported on 05/10/2020)     No facility-administered medications prior to visit.    Review of Systems  Constitutional: Negative for chills, diaphoresis, fever, malaise/fatigue and weight loss.       Weight gain  HENT: Negative for congestion, ear pain and sore throat.   Respiratory:  Positive for shortness of breath. Negative for cough, hemoptysis, sputum production and wheezing.   Cardiovascular: Negative for chest pain, palpitations and leg swelling.  Gastrointestinal: Negative for abdominal pain, heartburn and nausea.  Genitourinary: Negative for frequency.  Musculoskeletal: Negative for joint pain and myalgias.  Skin: Negative for itching and rash.  Neurological: Negative for dizziness, weakness and headaches.  Endo/Heme/Allergies: Does not bruise/bleed easily.  Psychiatric/Behavioral: Negative for depression. The patient is not nervous/anxious.      Objective:   Vitals:   05/10/20 1057  BP: 110/62  Pulse: 78  Temp: 98 F (36.7 C)  SpO2: 98%  Weight: 118 lb (53.5 kg)  Height: 5' (1.524 m)  SpO2: 98 % O2 Device: None (Room air)  Physical Exam: General: Elderly-appearing, no acute distress HENT: [place], AT Eyes: EOMI, no scleral icterus Respiratory: Diminished left lower lobe breath sounds. Normal right air entry.  No crackles, wheezing or rales Cardiovascular: RRR, -M/R/G, no JVD Extremities:-Edema,-tenderness Neuro: AAO x4, CNII-XII grossly intact Psych: Normal mood, normal affect  Data Reviewed:  Imaging: CT Chest 12/23/19 - S/p SVR. Tortuous thoracic aorta. Mild centrilobular emphysema. Large hiatal hernia. Stable pulmonary nodules including RML 7x5 mm and LLL ~4.5 mm, unchanged from prior imaging  PFT: None  Labs: WBC 05/04/20 9.9 Absolute eos 05/04/20 200  Imaging, labs and test noted above have been reviewed independently by me.    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 I have reviewed chart, chest imaging, discussed testing and coordinated care during this visit. I addressed patient and daughter's questions to their satisfaction. Will plan to treat emphysema with formal evaluation with PFTs for work-up of her shortness of  breath.  Asthma/Emphysema --START Spiriva 2.5 mcg TWO puffs TWICE a day. Prescription sent. Please call office if any issues with medication --START Singulair 10 mg daily --Arrange for PFTs prior to visit with me on 05/18/20 at 10 AM  Health Maintenance Immunization History  Administered Date(s) Administered  . Influenza Whole 10/27/2015  . PFIZER(Purple Top)SARS-COV-2 Vaccination 02/25/2019, 03/24/2019, 11/08/2019  . Pneumococcal Polysaccharide-23 01/21/2006  . Tdap 10/27/2015   CT Lung Screen - not indicated  No orders of the defined types were placed in this encounter.  Meds ordered this encounter  Medications  . DISCONTD: montelukast (SINGULAIR) 10 MG tablet    Sig: Take 1 tablet (10 mg total) by mouth at bedtime.    Dispense:  30 tablet    Refill:  6  . Tiotropium Bromide Monohydrate (SPIRIVA RESPIMAT) 2.5 MCG/ACT AERS    Sig: Inhale 2 puffs into the lungs daily.    Dispense:  4 g    Refill:  3  . Tiotropium Bromide Monohydrate (SPIRIVA RESPIMAT) 2.5 MCG/ACT AERS    Sig: Inhale 2 puffs into the lungs daily.    Dispense:  4 g    Refill:  0    Order Specific Question:   Lot Number?    [id number]:[id number]  . montelukast (SINGULAIR) 10 MG tablet    Sig: Take 1 tablet (10 mg total) by mouth daily.    Dispense:  30 tablet    Refill:  6    Return in about 8 days (around 05/18/2020).  I have spent a total time of 60-minutes on the day of the appointment reviewing prior documentation, coordinating care and discussing medical diagnosis and plan with the patient/family. Imaging, labs and tests included in this note have been reviewed and interpreted independently by me.  [place], MD [facility] 05/10/2020 11:03 AM  Office Number [phone]

## 2020-05-10 NOTE — Patient Instructions (Addendum)
Asthma/Emphysema --START Spiriva 2.5 mcg TWO puffs TWICE a day. Prescription sent. Please call office if any issues with medication --START Singulair 10 mg daily --Arrange for PFTs prior to visit with me on 05/18/20 at 10 AM

## 2020-05-11 NOTE — Telephone Encounter (Signed)
ATC pt. Line rang, someone picked up but did not say anything. Will forward to triage to try and call pt.

## 2020-05-11 NOTE — Progress Notes (Signed)
Primary Physician/Referring:  Kelton Pillar, MD  Patient ID: [name] [name], female    DOB: [birth year], 87 y.o.   MRN: [id number]  Chief Complaint  Patient presents with  . Shortness of Breath  . Fatigue  . Follow-up   HPI:    [name] [name]  is a 87 y.o. Caucasian female with history of benign positional vertigo, chronic dyspnea, chronic back pain, hypertension, mixed hyperlipidemia and  symptomatic aortic stenosis, CAD S/P PCI to LAD/D1  on 01/05/2019. Underwent elective TAVR on 01/26/2019, mild to moderate paravalvular leak. She has aspirin anaphylaxis hence only on Plavix.  Patient was hospitalized 05/05/2020 - 05/07/2020 acute respiratory failure with hypoxia and hypokalemia.  At that time patient's BNP was normal and troponins negative.  She now presents for follow-up.  Patient continues to have dyspnea on exertion.  She is presently following with Dr. Loanne Drilling who is seeing her next week and doing PFTs as well as testing for TB.  Patient has also been seen by ENT and started on Singulair as well as adjustments to her current allergy medications.  She is presently scheduled to undergo nuclear stress test in our office next week.  Past Medical History:  Diagnosis Date  . Arthritis   . Asthma    history of  . CAD (coronary artery disease)   . Dyspnea    sometimes sitting.lying and on exertion  . GERD (gastroesophageal reflux disease)   . History of hiatal hernia   . History of transcatheter aortic valve replacement (TAVR) 01/26/2019   (TAVR  23 mm Evolute Pro in aortic position 01/26/2019) .   Marland Kitchen Hyperlipidemia   . Hypertension   . Hypothyroidism   . Lipoma    left arm  . Nasal polyps   . Pneumonia   . Pulmonary nodule    noted on pre TAVR CT  . Severe aortic stenosis    Past Surgical History:  Procedure Laterality Date  . CHOLECYSTECTOMY  02/27/04  . CORONARY ATHERECTOMY N/A 01/05/2019   Procedure: CORONARY ATHERECTOMY;  Surgeon: Adrian Prows, MD;  Location: [facility];  Service: Cardiovascular;  Laterality: N/A;  . CORONARY BALLOON ANGIOPLASTY N/A 01/05/2019   Procedure: CORONARY BALLOON ANGIOPLASTY;  Surgeon: Adrian Prows, MD;  Location: [facility];  Service: Cardiovascular;  Laterality: N/A;  . CORONARY STENT INTERVENTION N/A 01/05/2019   Procedure: CORONARY STENT INTERVENTION;  Surgeon: Adrian Prows, MD;  Location: [facility];  Service: Cardiovascular;  Laterality: N/A;  . CORONARY STENT INTERVENTION  01/05/2019  . INCONTINENCE SURGERY  09/12/09  . NASAL SINUS SURGERY     several    . ORIF ANKLE FRACTURE Right 09/11/2013   Procedure: OPEN REDUCTION INTERNAL FIXATION (ORIF) ANKLE FRACTURE;  Surgeon: Augustin Schooling, MD;  Location: [facility];  Service: Orthopedics;  Laterality: Right;  . RIGHT/LEFT HEART CATH AND CORONARY ANGIOGRAPHY N/A 12/15/2018   Procedure: RIGHT/LEFT HEART CATH AND CORONARY ANGIOGRAPHY;  Surgeon: Adrian Prows, MD;  Location: [facility];  Service: Cardiovascular;  Laterality: N/A;  . ROTATOR CUFF REPAIR  09/27/2003  . TRANSCATHETER AORTIC VALVE REPLACEMENT, TRANSFEMORAL  01/26/2019  . TRANSCATHETER AORTIC VALVE REPLACEMENT, TRANSFEMORAL N/A 01/26/2019   Procedure: TRANSCATHETER AORTIC VALVE REPLACEMENT, TRANSFEMORAL;  Surgeon: Burnell Blanks, MD;  Location: [facility];  Service: Open Heart Surgery;  Laterality: N/A;  . TRIGGER FINGER RELEASE  10/29/06   Family History  Problem Relation Age of Onset  . Tuberculosis Mother   . Other Father  Bronchiectasis  . Cancer Maternal Grandmother        ovarian  . Sarcoidosis Sister   . Asthma Maternal Grandfather   . Heart disease Brother   . Heart attack Brother   . Breast cancer Daughter     Social History   Tobacco Use  . Smoking status: Never Smoker  . Smokeless tobacco: Never Used  Substance Use Topics  . Alcohol use: No    Marital Status: Married  ROS  Review of Systems  Constitutional: Positive for malaise/fatigue. Negative for weight gain.   Cardiovascular: Positive for dyspnea on exertion. Negative for chest pain, claudication, leg swelling, near-syncope, orthopnea, palpitations, paroxysmal nocturnal dyspnea and syncope.  Respiratory: Positive for wheezing (occasional and chronic).   Hematologic/Lymphatic: Does not bruise/bleed easily.  Musculoskeletal: Positive for arthritis, back pain and joint pain.  Gastrointestinal: Negative for melena.  Neurological: Negative for dizziness and weakness.   Objective  Blood pressure (!) 104/56, pulse 87, temperature 98 F (36.7 C), height 5' (1.524 m), weight 116 lb (52.6 kg), SpO2 96 %. Body mass index is 22.65 kg/m.   Vitals with BMI 05/12/2020 05/10/2020 05/07/2020  Height _0  _1  -  Weight 116 lbs 118 lbs -  BMI 09.62 83.66 -  Systolic 294 765 465  Diastolic 56 62 66  Pulse 87 78 69    Physical Exam Vitals reviewed.  Constitutional:      General: She is not in acute distress.    Appearance: She is well-developed.  HENT:     Head: Normocephalic and atraumatic.  Cardiovascular:     Rate and Rhythm: Normal rate and regular rhythm.     Pulses: Intact distal pulses.          Carotid pulses are on the right side with bruit and on the left side with bruit.      Popliteal pulses are 2+ on the right side and 2+ on the left side.       Dorsalis pedis pulses are 2+ on the right side and 2+ on the left side.       Posterior tibial pulses are 2+ on the right side and 2+ on the left side.     Heart sounds: S1 normal and S2 normal. Murmur heard.   Harsh midsystolic murmur is present with a grade of 3/6 at the upper right sternal border radiating to the neck. No gallop.      Comments: No JVD.   Pulmonary:     Effort: Pulmonary effort is normal. No respiratory distress.     Breath sounds: Normal breath sounds. No wheezing, rhonchi or rales.  Musculoskeletal:     Right lower leg: Edema (trace) present.     Left lower leg: Edema (trace) present.  Neurological:     Mental Status: She  is alert.    Laboratory examination:   Recent Labs    11/30/19 1007 05/04/20 1724 05/05/20 0650 05/06/20 0111  NA 138 127* 134* 135  K 4.5 2.9* 3.7 3.8  CL 101 87* 101 104  CO2 _2 GLUCOSE 104* 116* 97 95  BUN 18 30* 21 16  CREATININE 1.36* 1.53* 1.32* 1.14*  CALCIUM 9.7 8.5* 7.6* 7.8*  GFRNONAA 35* 33* 39* 47*  GFRAA 41*  --   --   --    CMP Latest Ref Rng & Units 05/06/2020 05/05/2020 05/04/2020  Glucose 70 - 99 mg/dL 95 97 116(H)  BUN 8 - 23 mg/dL 16 21 30(H)  Creatinine  0.44 - 1.00 mg/dL 1.14(H) 1.32(H) 1.53(H)  Sodium 135 - 145 mmol/L 135 134(L) 127(L)  Potassium 3.5 - 5.1 mmol/L 3.8 3.7 2.9(L)  Chloride 98 - 111 mmol/L 104 101 87(L)  CO2 22 - 32 mmol/L $RemoveB'22 29 30  'KeMpGocM$ Calcium 8.9 - 10.3 mg/dL 7.8(L) 7.6(L) 8.5(L)  Total Protein 6.5 - 8.1 g/dL 5.0(L) - 6.5  Total Bilirubin 0.3 - 1.2 mg/dL 0.2(L) - 0.5  Alkaline Phos 38 - 126 U/L 180(H) - 229(H)  AST 15 - 41 U/L 41 - 41  ALT 0 - 44 U/L 27 - 30   CBC Latest Ref Rng & Units 05/04/2020 02/23/2019 01/27/2019  WBC 4.0 - 10.5 K/uL 9.9 8.6 8.0  Hemoglobin 12.0 - 15.0 g/dL 9.1(L) 9.6(L) 8.0(L)  Hematocrit 36.0 - 46.0 % 29.2(L) 30.0(L) 24.9(L)  Platelets 150 - 400 K/uL 361 287 171   Lipid Panel Recent Labs    11/30/19 1007  CHOL 163  TRIG 83  LDLCALC 85  HDL 62    HEMOGLOBIN A1C Lab Results  Component Value Date   HGBA1C 4.9 01/21/2019   MPG 93.93 01/21/2019   TSH No results for input(s): TSH in the last 8760 hours.  External Labs:   Labs 02/15/2020: LFTs normal, serum glucose 113 mg, BUN 25, creatinine 1.27, EGFR 40 mL, sodium 137, potassium 3.7.  Alkaline phosphatase 518.  Hb 12.3/HCT 35.9, platelets 244.  Cholesterol, total 212.000 09/21/2019 HDL 65.000 09/21/2019 LDL-C 130.000 09/21/2019 Triglycerides 97.000 09/21/2019  A1C 4.900 01/21/2019 TSH 2.070 09/21/2019  Hemoglobin 12.300 09/21/2019  Creatinine, Serum 1.260 09/21/2019 Potassium 4.800 09/21/2019 Magnesium N/D ALT (SGPT) 59.000  09/21/2019  Hemoglobin 10.400 03/22/2019; INR 1.100 01/21/2019 Platelets 287.000 02/23/2019  Creatinine, Serum 1.250 03/22/2019 Potassium 5.100 03/22/2019 ALT (SGPT) 47.000 03/22/2019  TSH 3.200 03/22/2019  07/25/2017: Hemoglobin A1c 9%. Creatinine 1.16, EGFR 54, potassium 4.5, alkaline loss 420, AST 55, CMP otherwise normal. ( PCP has held Zetia and simvastatin for 2 months due to elevated LFTs). Hemoglobin 11.8.   Cholesterol 157, triglycerides 105, HDL 60, LDL 78.  05/27/2017: Cholesterol 208, triglycerides 84, HDL 81, LDL 110.  Medications and allergies   Allergies  Allergen Reactions  . Aspirin Anaphylaxis  . Bee Venom Anaphylaxis  . Ceclor [Cefaclor] Anaphylaxis  . Pneumococcal Vac Polyvalent     Other reaction(s): swelling and erythema  . Statins Other (See Comments)    Elevated liver enzymes  . Penicillins Rash    Has patient had a PCN reaction causing immediate rash, facial/tongue/throat swelling, SOB or lightheadedness with hypotension: Yes Has patient had a PCN reaction causing severe rash involving mucus membranes or skin necrosis: No Has patient had a PCN reaction that required hospitalization No Has patient had a PCN reaction occurring within the last 10 years: No If all of the above answers are "NO", then may proceed with Cephalosporin use.  . Sulfa Antibiotics Rash   Current Outpatient Medications on File Prior to Visit  Medication Sig Dispense Refill  . atorvastatin (LIPITOR) 10 MG tablet Take 1 tablet (10 mg total) by mouth daily. 90 tablet 3  . calcium-vitamin D (OSCAL WITH D) 250-125 MG-UNIT tablet Take 1 tablet by mouth daily.    . clopidogrel (PLAVIX) 75 MG tablet TAKE 1 TABLET BY MOUTH EVERY DAY (Patient taking differently: Take 75 mg by mouth daily.) 90 tablet 1  . fexofenadine (ALLEGRA) 180 MG tablet Take 180 mg by mouth daily.    Marland Kitchen levothyroxine (SYNTHROID) 50 MCG tablet Take 50 mcg by mouth daily before breakfast.    .  mometasone (NASONEX) 50 MCG/ACT nasal spray  Place 2 sprays into the nose daily.    . montelukast (SINGULAIR) 10 MG tablet Take 1 tablet (10 mg total) by mouth daily. 30 tablet 6  . Multiple Vitamin (MULTIVITAMIN WITH MINERALS) TABS tablet Take 1 tablet by mouth daily.    Vladimir Faster Glycol-Propyl Glycol (SYSTANE) 0.4-0.3 % SOLN Place 1 drop into both eyes 3 (three) times daily as needed (dry/irritated eyes.).    Marland Kitchen propranolol (INDERAL) 40 MG tablet Take 20 mg by mouth 3 (three) times daily.    . Tiotropium Bromide Monohydrate (SPIRIVA RESPIMAT) 2.5 MCG/ACT AERS Inhale 2 puffs into the lungs daily. 4 g 3  . Tiotropium Bromide Monohydrate (SPIRIVA RESPIMAT) 2.5 MCG/ACT AERS Inhale 2 puffs into the lungs daily. 4 g 0  . amoxicillin (AMOXIL) 500 MG capsule Take 500 mg by mouth as needed. Take medication only before dental procedures. (Patient not taking: Reported on 05/10/2020)    . meclizine (ANTIVERT) 25 MG tablet Take 25 mg by mouth 3 (three) times daily as needed for dizziness.  (Patient not taking: Reported on 05/10/2020)     No current facility-administered medications on file prior to visit.    Radiology:   CT cardiac morphology 03/14/2019: 1. Well positioned 23 mm Evolut Pro valve with normal appearing leaflets. Good coaptation with no HALT/HAM. Although target diameters for inflow, waist, outflow in systole are slightly low the mechanism of worsening PVL appears to be primarily a large area of calcification posterior to the stent valve from the native non coronary cusp. This area of calcium measures 4.9 x 5.7 mm and is best seen on SA images at 7:00.  Extracardiac:  1. Right middle lobe nodule is stable from 09/12/2018. Additional follow-up CT chest without contrast in 12 months is recommended to ensure stability and exclude malignancy. This recommendation follows the consensus statement: Guidelines for Management of Small Pulmonary Nodules Detected on CT Images: From the Fleischner Society 2017; Radiology 2017;  284:228-243. 2.  Aortic atherosclerosis (ICD10-I70.0). 3. Large hiatal hernia.  Cardiac Studies:    Exercise myoview stress 04/25/2017: 1. The patient performed treadmill exercise using a Bruce protocol, completing 5 minutes. The patient completed an estimated workload of 4.88 METS, reaching 103% of the maximum predicted heart rate. Stress symptoms included dyspnea. Exercise capacity fair for age. Normal hemodynamic response. Possible old anteroseptal infarct. Poor R wave progression. No ischemic changes on stress electrocardiogram. 2. The overall quality of the study is excellent. There is no evidence of abnormal lung activity. Stress and rest SPECT images demonstrate homogeneous tracer distribution throughout the myocardium. Gated SPECT imaging reveals normal myocardial thickening and wall motion. The left ventricular ejection fraction was normal (77%). 3. Low risk study.  Right and left heart catheterization 12/15/2018: RA 5/5, mean 5; RV 31/3, EDP 6; PA 32/14, mean 21, PA saturation 67%; PW 11/10, mean 10 mmHg.  CO 3.84, CI  2.5756. QP/QS 1.0.  Normal right heart catheterization. Mildly elevated LVEDP at 15 mmHg.  Peak aortic valve gradient of 27, mean 23.6 mmHg with calculated aortic valve area 0.76 cm.  Findings consistent with severe calcific degenerative aortic valve stenosis. Left dominant circulation, proximal LAD 80% stenosis, large D1 with ostial 90% stenosis, medium sized RI with a proximal 90% stenosis, dominant circumflex mild disease.  RCA mild disease and nondominant.  Recommendation: Patient will need to be evaluated for CABG and aortic valve replacement.  If the TAVR team feels she is a good candidate for TAVR, we could certainly  consider either medical management of CAD or PCI to proximal LAD and TAVR. Patient be discharged home today with outpatient follow-up.  Patient has been referred to be evaluated by Dr. Gilford Raid. 30 mL contrast utilized.  Left Heart Catheterization  01/05/2019:  Successful orbital atherectomy followed by stenting of the proximal LAD with a 3.0 x 18 mm resolute Onyx DES and proximal segment postdilated with a 3.5 x 8 mm Sapphire [place].  Stenosis 90% to 0%.  TIMI-3 to TIMI-3 flow.  Orbital atherectomy followed by balloon angioplasty with 2.5 x 15 mm Sapphire balloon for D1, 90% to less than 15 to 20% stenosis.  TIMI-3 to TIMI-3 flow.  01/26/2019 (Dr. Angelena Form):  Transcatheter Aortic Valve Replacement - Transfemoral Approach Medtronic Evolut Pro PlusTHV (size74m, model #[id number], serial #[id number]   Echocardiogram 02/15/2020: Normal LV systolic function with visual EF 55-60%. Left ventricle cavity is normal in size. Mild to moderate left ventricular hypertrophy. Normal global wall motion. Doppler evidence of grade II diastolic dysfunction, elevated LAP.  Left atrial cavity is mildly dilated. Bioprosthetic aortic valve (TAVR 23 mm Evolute Pro 01/26/2019) well seated, trivial paravalvular regurgitation.   No aortic stenosis. Mild to moderate valvular regurgitation. Mild (Grade I) mitral regurgitation. Moderate tricuspid regurgitation. No evidence of pulmonary hypertension. Mild pulmonic regurgitation. Compared to prior study dated 09/28/2019: G1DD is now G2DD with elevated LAP otherwise no significant change.   Carotid artery duplex 02/15/2020: Doppler velocity suggests stenosis in the right internal carotid artery (50-69%). Stenosis in the right external carotid artery (<50%). Doppler velocity suggests stenosis in the left internal carotid artery (16-49%). Stenosis in the left external carotid artery (<50%). Antegrade right vertebral artery flow. Antegrade left vertebral artery flow. Compared to 09/28/2019, there is progression of disease severity in right and regression in the left carotid artery. Follow up in six months is appropriate if clinically indicated. EKG 05/05/2020: Sinus rhythm 70 bpm.  Left bundle branch  block.  CTA chest 03/05/2019: 1. Well positioned 23 mm Evolut Pro valve with normal appearing leaflets. Good coaptation with no HALT/HAM. Although target diameters for inflow, waist, outflow in systole are slightly low the mechanism of worsening PVL appears to be primarily a large area of calcification posterior to the stent valve from the native non coronary cusp. This area of calcium measures 4.9 x 5.7 mm and is best seen on SA images at 7:00.  Echocardiogram 05/05/2020: 1. Left ventricular ejection fraction, by estimation, is 55 to 60%. The  left ventricle has normal function. The left ventricle has no regional  wall motion abnormalities. There is mild left ventricular hypertrophy.  Left ventricular diastolic function  could not be evaluated.  2. Right ventricular systolic function is normal. The right ventricular  size is normal. There is normal pulmonary artery systolic pressure.  3. Well seated 23 mm Evolute Pro TAVR valve. Mild paravalvular leak. Mean  PG 14 mmhg only mildly higher than prior (9 mmHg). DVR of 0.44 suggests no  significant aortic stenosis.  4. Mild mitral leaflet thickening. Trivial mitral regurgitation.  5. Mild to moderate tricuspid regurgitation. Estimated PASP 32 mmHg.  6. Mild pulmonic regurgitation.  7. Compared to previous outpatient study on 02/15/2020, no major change  noted.   EKG  EKG 03/12/2020: Normal sinus rhythm at rate of 84 bpm, left atrial enlargement, left bundle branch block.  No further analysis. No significant change from EKG 02/28/2020  Assessment     ICD-10-CM   1. Chronic diastolic (congestive) heart failure (HCC)  IG25.42Basic metabolic  panel  2. Primary hypertension  F69 Basic metabolic panel  3. S/P TAVR (transcatheter aortic valve replacement)  Z95.2   4. Exertional dyspnea  R06.00   5. Acute on chronic diastolic heart failure (HCC)  I50.33 furosemide (LASIX) 20 MG tablet     Meds ordered this encounter  Medications   . potassium chloride (KLOR-CON) 10 MEQ tablet    Sig: Take 1 tablet (10 mEq total) by mouth daily.    Dispense:  90 tablet    Refill:  3  . furosemide (LASIX) 20 MG tablet    Sig: Take 1 tablet (20 mg total) by mouth daily.    Dispense:  90 tablet    Refill:  3   Medications Discontinued During This Encounter  Medication Reason  . cetirizine (ZYRTEC) 10 MG tablet Error  . cholecalciferol (VITAMIN D) 25 MCG (1000 UT) tablet Error  . ezetimibe (ZETIA) 10 MG tablet Error  . furosemide (LASIX) 20 MG tablet Reorder     Recommendations:   ZISSEL BIEDERMAN  is a 87 y.o. Caucasian female with history of benign positional vertigo, chronic dyspnea, chronic back pain, hypertension, mixed hyperlipidemia and  symptomatic aortic stenosis, CAD S/P PCI to LAD/D1  on 01/05/2019. Underwent elective TAVR on 01/26/2019, mild to moderate paravalvular leak.   Patient was hospitalized 05/05/2020 - 05/07/2020 acute respiratory failure with hypoxia and hypokalemia.  At that time patient's BNP was normal and troponins negative.  She now presents for follow-up.  Patient continues to have dyspnea on exertion which is likely multifactorial including underlying lung pathology, deconditioning, potentially hiatal hernia.  She is currently scheduled for nuclear stress test to evaluate for underlying CAD as etiology of patient's current symptoms.  Encouraged her to continue to follow with pulmonology closely.  If cardiopulmonary work-up is unyielding will consider referral for correction of hiatal hernia as it may be contributing to significant shortness of breath.  Patient does note today that pulmonology is further evaluating her for TB as they recommend ruling out latent TB as mass on chest x-ray as opposed to.  Will defer further management to pulmonology.  Refill patient's Lasix as well as advised her to start taking potassium supplement with furosemide.  We will not make further changes at this time.  We will repeat BMP  in 1 week to evaluate potassium.  Further recommendations from cardiovascular standpoint pending results of nuclear stress testing.  I personally reviewed external records and records from recent hospitalization.  Follow-up in 6 weeks, sooner if needed, for dyspnea on exertion.   Alethia Berthold, PA-C 05/12/2020, 4:54 PM Office: [phone]

## 2020-05-11 NOTE — Telephone Encounter (Signed)
Called and spoke with pt who is requesting to have an order sent to her local pharmacy of [facility] for a peak flow meter.  Pt also wants to know if she is needing to have TB test done. Dr. Loanne Drilling, please advise.

## 2020-05-12 ENCOUNTER — Other Ambulatory Visit: Payer: Self-pay | Admitting: Pulmonary Disease

## 2020-05-12 ENCOUNTER — Other Ambulatory Visit: Payer: Self-pay

## 2020-05-12 ENCOUNTER — Encounter: Payer: Self-pay | Admitting: Student

## 2020-05-12 ENCOUNTER — Ambulatory Visit: Payer: Federal, State, Local not specified - PPO | Admitting: Student

## 2020-05-12 VITALS — BP 104/56 | HR 87 | Temp 98.0°F | Ht 60.0 in | Wt 116.0 lb

## 2020-05-12 DIAGNOSIS — R0609 Other forms of dyspnea: Secondary | ICD-10-CM

## 2020-05-12 DIAGNOSIS — I5033 Acute on chronic diastolic (congestive) heart failure: Secondary | ICD-10-CM

## 2020-05-12 DIAGNOSIS — I10 Essential (primary) hypertension: Secondary | ICD-10-CM

## 2020-05-12 DIAGNOSIS — Z952 Presence of prosthetic heart valve: Secondary | ICD-10-CM

## 2020-05-12 DIAGNOSIS — R0600 Dyspnea, unspecified: Secondary | ICD-10-CM

## 2020-05-12 DIAGNOSIS — I5032 Chronic diastolic (congestive) heart failure: Secondary | ICD-10-CM

## 2020-05-12 MED ORDER — FUROSEMIDE 20 MG PO TABS: 20.0000 mg | ORAL_TABLET | Freq: Every day | ORAL | 3 refills | Status: DC

## 2020-05-12 MED ORDER — PEAK FLOW METER DEVI: 1.0000 | Freq: Two times a day (BID) | 0 refills | Status: AC

## 2020-05-12 MED ORDER — POTASSIUM CHLORIDE ER 10 MEQ PO TBCR: 10.0000 meq | EXTENDED_RELEASE_TABLET | Freq: Every day | ORAL | 3 refills | Status: DC

## 2020-05-12 NOTE — Telephone Encounter (Signed)
Yes please order peak flow meter.  As for blood test. We can discuss and obtain at next clinic visit with me.

## 2020-05-12 NOTE — Telephone Encounter (Signed)
I called and spoke with patient regarding Dr. Loanne Drilling recs. She verbalized understanding and I sent in peak flow meter to preferred pharmacy and will await doing blood test at next visit. Nothing further needed at this time.

## 2020-05-12 NOTE — Telephone Encounter (Signed)
Lm for patient.  

## 2020-05-15 ENCOUNTER — Other Ambulatory Visit (HOSPITAL_COMMUNITY)
Admission: RE | Admit: 2020-05-15 | Discharge: 2020-05-15 | Disposition: A | Discharge status: Home / Self Care | Payer: Federal, State, Local not specified - PPO | Source: Ambulatory Visit | Attending: Pulmonary Disease | Admitting: Pulmonary Disease

## 2020-05-15 ENCOUNTER — Other Ambulatory Visit: Payer: Self-pay

## 2020-05-15 ENCOUNTER — Ambulatory Visit: Payer: Federal, State, Local not specified - PPO

## 2020-05-15 DIAGNOSIS — Z01812 Encounter for preprocedural laboratory examination: Secondary | ICD-10-CM | POA: Diagnosis present

## 2020-05-15 DIAGNOSIS — Z20822 Contact with and (suspected) exposure to covid-19: Secondary | ICD-10-CM | POA: Diagnosis not present

## 2020-05-15 DIAGNOSIS — R0600 Dyspnea, unspecified: Secondary | ICD-10-CM

## 2020-05-15 DIAGNOSIS — R0609 Other forms of dyspnea: Secondary | ICD-10-CM

## 2020-05-16 ENCOUNTER — Encounter: Payer: Self-pay | Admitting: Pulmonary Disease

## 2020-05-16 LAB — SARS CORONAVIRUS 2 (TAT 6-24 HRS): SARS Coronavirus 2: NEGATIVE

## 2020-05-17 ENCOUNTER — Ambulatory Visit (INDEPENDENT_AMBULATORY_CARE_PROVIDER_SITE_OTHER): Payer: Federal, State, Local not specified - PPO | Admitting: Pulmonary Disease

## 2020-05-17 ENCOUNTER — Other Ambulatory Visit: Payer: Self-pay

## 2020-05-17 DIAGNOSIS — J432 Centrilobular emphysema: Secondary | ICD-10-CM | POA: Diagnosis not present

## 2020-05-17 NOTE — Progress Notes (Signed)
Full PFT completed today ? ?

## 2020-05-17 NOTE — Progress Notes (Signed)
Pt called back, spoke to pt regarding stress test results. Pt voiced understanding.

## 2020-05-17 NOTE — Progress Notes (Signed)
Called patient, NA, LMAM

## 2020-05-18 ENCOUNTER — Encounter: Payer: Self-pay | Admitting: Pulmonary Disease

## 2020-05-18 ENCOUNTER — Ambulatory Visit: Payer: Federal, State, Local not specified - PPO | Admitting: Pulmonary Disease

## 2020-05-18 ENCOUNTER — Institutional Professional Consult (permissible substitution): Payer: Federal, State, Local not specified - PPO | Admitting: Pulmonary Disease

## 2020-05-18 VITALS — BP 108/70 | HR 72 | Temp 97.0°F | Ht 60.0 in | Wt 119.4 lb

## 2020-05-18 DIAGNOSIS — Z8611 Personal history of tuberculosis: Secondary | ICD-10-CM | POA: Diagnosis not present

## 2020-05-18 DIAGNOSIS — R0602 Shortness of breath: Secondary | ICD-10-CM

## 2020-05-18 DIAGNOSIS — J432 Centrilobular emphysema: Secondary | ICD-10-CM | POA: Diagnosis not present

## 2020-05-18 LAB — PULMONARY FUNCTION TEST
DL/VA % pred: 60 %
DL/VA: 2.51 ml/min/mmHg/L
DLCO cor % pred: 52 %
DLCO cor: 8.37 ml/min/mmHg
DLCO unc % pred: 43 %
DLCO unc: 7.01 ml/min/mmHg
FEF 25-75 Post: 0.88 L/sec
FEF 25-75 Pre: 0.85 L/sec
FEF2575-%Change-Post: 3 %
FEF2575-%Pred-Post: 100 %
FEF2575-%Pred-Pre: 97 %
FEV1-%Change-Post: 0 %
FEV1-%Pred-Post: 93 %
FEV1-%Pred-Pre: 92 %
FEV1-Post: 1.26 L
FEV1-Pre: 1.26 L
FEV1FVC-%Change-Post: 1 %
FEV1FVC-%Pred-Pre: 100 %
FEV6-%Change-Post: 0 %
FEV6-%Pred-Post: 98 %
FEV6-%Pred-Pre: 98 %
FEV6-Post: 1.7 L
FEV6-Pre: 1.7 L
FEV6FVC-%Change-Post: 1 %
FEV6FVC-%Pred-Post: 107 %
FEV6FVC-%Pred-Pre: 106 %
FVC-%Change-Post: -1 %
FVC-%Pred-Post: 91 %
FVC-%Pred-Pre: 92 %
FVC-Post: 1.7 L
FVC-Pre: 1.72 L
Post FEV1/FVC ratio: 74 %
Post FEV6/FVC ratio: 100 %
Pre FEV1/FVC ratio: 73 %
Pre FEV6/FVC Ratio: 99 %
RV % pred: 86 %
RV: 1.99 L
TLC % pred: 84 %
TLC: 3.75 L

## 2020-05-18 MED ORDER — ALBUTEROL SULFATE HFA 108 (90 BASE) MCG/ACT IN AERS: 2.0000 | INHALATION_SPRAY | Freq: Four times a day (QID) | RESPIRATORY_TRACT | 6 refills | Status: DC | PRN

## 2020-05-18 MED ORDER — SPIRIVA RESPIMAT 2.5 MCG/ACT IN AERS: 2.0000 | INHALATION_SPRAY | Freq: Every day | RESPIRATORY_TRACT | 6 refills | Status: DC

## 2020-05-18 NOTE — Progress Notes (Signed)
Subjective:   PATIENT ID: [name] [name] GENDER: female DOB: 05/01/1931, MRN: [id number]   HPI  Chief Complaint  Patient presents with  . Follow-up    Reports no new concerns, wants to review results.    Reason for Visit: PFT follow-up  [name] [name] is a 87 year old female never smoker with emphysema, CAD s/p stent, AVS s/pt TAVR 01/2019, pulmonary nodules who presents for follow-up.  Synopsis:  She was referred to Pulmonary after being discharged from the hospital in 04/2020. Per review of discharge note she required 1-2L O2 in the ED. V/Q scan was negative for PE. While inpatient she was diuresed. Cardiology was consulted and plan for outpatient nuclear stress test. She was also referred to Pulmonary to consider PFT for evaluation of her emphysema. At time of discharge she did not require any oxygen. She has been on lasix and has noticed weight gain of 10lbs post-discharge. This morning is 116lb, baseline is 113-115lb.  She reports childhood asthma and respiratory complications during pregnancy in her 20-30s. She was on a nebulizer 2-3 x daily in her 50-60s and needing steroids at least twice a year. However in her 70s-80s she has not had any issues or needing any medications for her asthma and this may have been related from removing environmental issues (older home, dust, tobacco farms, gardening)  She has had multiple sinus surgery x 4 including for nasal polyp removal. She was seen by ENT yesterday. She takes zyrtec and saline rinses. She is scheduled for stress test next week with [facility].  05/18/20 Since our last visit, she has been tolerating Spiriva and feels that it is improving her shortness of breath. Her daughter is present with her daughter and wish to review PFT results.  Social History: Never smoker Her mother was diagnosed with tuberculosis Wood burning stove I have personally reviewed patient's past medical/family/social history/allergies/current  medications.  Past Medical History:  Diagnosis Date  . Arthritis   . Asthma    history of  . CAD (coronary artery disease)   . Dyspnea    sometimes sitting.lying and on exertion  . GERD (gastroesophageal reflux disease)   . History of hiatal hernia   . History of transcatheter aortic valve replacement (TAVR) 01/26/2019   (TAVR  23 mm Evolute Pro in aortic position 01/26/2019) .   Marland Kitchen Hyperlipidemia   . Hypertension   . Hypothyroidism   . Lipoma    left arm  . Nasal polyps   . Pneumonia   . Pulmonary nodule    noted on pre TAVR CT  . Severe aortic stenosis      Allergies  Allergen Reactions  . Aspirin Anaphylaxis  . Bee Venom Anaphylaxis  . Ceclor [Cefaclor] Anaphylaxis  . Pneumococcal Vac Polyvalent     Other reaction(s): swelling and erythema  . Statins Other (See Comments)    Elevated liver enzymes  . Penicillins Rash    Has patient had a PCN reaction causing immediate rash, facial/tongue/throat swelling, SOB or lightheadedness with hypotension: Yes Has patient had a PCN reaction causing severe rash involving mucus membranes or skin necrosis: No Has patient had a PCN reaction that required hospitalization No Has patient had a PCN reaction occurring within the last 10 years: No If all of the above answers are "NO", then may proceed with Cephalosporin use.  . Sulfa Antibiotics Rash     Outpatient Medications Prior to Visit  Medication Sig Dispense Refill  . amoxicillin (AMOXIL) 500 MG  capsule Take 500 mg by mouth as needed. Take medication only before dental procedures.    Marland Kitchen atorvastatin (LIPITOR) 10 MG tablet Take 1 tablet (10 mg total) by mouth daily. 90 tablet 3  . calcium-vitamin D (OSCAL WITH D) 250-125 MG-UNIT tablet Take 1 tablet by mouth daily.    . clopidogrel (PLAVIX) 75 MG tablet TAKE 1 TABLET BY MOUTH EVERY DAY (Patient taking differently: Take 75 mg by mouth daily.) 90 tablet 1  . fexofenadine (ALLEGRA) 180 MG tablet Take 180 mg by mouth daily.    .  furosemide (LASIX) 20 MG tablet Take 1 tablet (20 mg total) by mouth daily. 90 tablet 3  . levothyroxine (SYNTHROID) 50 MCG tablet Take 50 mcg by mouth daily before breakfast.    . meclizine (ANTIVERT) 25 MG tablet Take 25 mg by mouth 3 (three) times daily as needed for dizziness.    . mometasone (NASONEX) 50 MCG/ACT nasal spray Place 2 sprays into the nose daily.    . montelukast (SINGULAIR) 10 MG tablet Take 1 tablet (10 mg total) by mouth daily. 30 tablet 6  . Multiple Vitamin (MULTIVITAMIN WITH MINERALS) TABS tablet Take 1 tablet by mouth daily.    . Peak Flow Meter DEVI 1 puff by Does not apply route in the morning and at bedtime. 1 each 0  . Polyethyl Glycol-Propyl Glycol (SYSTANE) 0.4-0.3 % SOLN Place 1 drop into both eyes 3 (three) times daily as needed (dry/irritated eyes.).    Marland Kitchen potassium chloride (KLOR-CON) 10 MEQ tablet Take 1 tablet (10 mEq total) by mouth daily. 90 tablet 3  . propranolol (INDERAL) 40 MG tablet Take 20 mg by mouth 3 (three) times daily.    . Tiotropium Bromide Monohydrate (SPIRIVA RESPIMAT) 2.5 MCG/ACT AERS Inhale 2 puffs into the lungs daily. 4 g 3  . Tiotropium Bromide Monohydrate (SPIRIVA RESPIMAT) 2.5 MCG/ACT AERS Inhale 2 puffs into the lungs daily. 4 g 0   No facility-administered medications prior to visit.    Review of Systems  Constitutional: Negative for chills, diaphoresis, fever, malaise/fatigue and weight loss.  HENT: Negative for congestion.   Respiratory: Positive for shortness of breath. Negative for cough, hemoptysis, sputum production and wheezing.   Cardiovascular: Negative for chest pain, palpitations and leg swelling.     Objective:   Vitals:   05/18/20 1000  BP: 108/70  Pulse: 72  Temp: (!) 97 F (36.1 C)  SpO2: 97%  Weight: 119 lb 6.4 oz (54.2 kg)  Height: 5' (1.524 m)   SpO2: 97 % (RA) O2 Device: None (Room air)  Physical Exam: General: Elderly-appearing, no acute distress HENT: [place], AT Eyes: EOMI, no scleral  icterus Respiratory: Diminished left lower lobe breath sounds.  No crackles, wheezing or rales Cardiovascular: RRR, -M/R/G, no JVD Extremities:-Edema,-tenderness Neuro: AAO x4, CNII-XII grossly intact Skin: Intact, no rashes or bruising Psych: Normal mood, normal affect   Data Reviewed:  Imaging: CT Chest 12/23/19 - S/p SVR. Tortuous thoracic aorta. Mild centrilobular emphysema. Large hiatal hernia. Stable pulmonary nodules including RML 7x5 mm and LLL ~4.5 mm, unchanged from prior imaging  PFT: 05/05/20 FVC 1.7 (91%) FEV1 1.26 (93%) Ratio 73  TLC 84% DLCO 43% Interpretation:  Normal spirometry. No obstructive or restrictive defect  Labs: WBC 05/04/20 9.9 Absolute eos 05/04/20 200  Imaging, labs and test noted above have been reviewed independently by me.    Assessment & Plan:   Discussion: 87 year old female with history of asthma, emphysema on CT, hx TB exposure who  presents for shortness of breath. Tolerating Spiriva with symptom relief. If she has persistent shortness of breath, can consider LABA/LAMA in the future. We reviewed PFTs and re-reviewed CT chest with emphysema and large hiatal hernia present.  Asthma/Emphysema --CONTINUE Spiriva 2.5 mcg TWO puffs TWICE a day --USE albuterol as needed for shortness of breath or wheezing --CONTINUE Singulair 10 mg daily  History of TB exposure --External order for quantiferon gold  Health Maintenance Immunization History  Administered Date(s) Administered  . Influenza Whole 10/27/2015  . PFIZER(Purple Top)SARS-COV-2 Vaccination 02/25/2019, 03/24/2019, 11/08/2019  . Pneumococcal Polysaccharide-23 01/21/2006  . Tdap 10/27/2015   CT Lung Screen - not indicated  Orders Placed This Encounter  Procedures  . QuantiFERON-TB Gold Plus  . Ambulatory Referral for DME    Referral Priority:   Routine    Referral Type:   Durable Medical Equipment Purchase    Number of Visits Requested:   1   Meds ordered this encounter   Medications  . Tiotropium Bromide Monohydrate (SPIRIVA RESPIMAT) 2.5 MCG/ACT AERS    Sig: Inhale 2 puffs into the lungs daily.    Dispense:  4 g    Refill:  6  . albuterol (VENTOLIN HFA) 108 (90 Base) MCG/ACT inhaler    Sig: Inhale 2 puffs into the lungs every 6 (six) hours as needed for shortness of breath (use as needed or before activity for shortness of breath or whezzing).    Dispense:  8 g    Refill:  6    Return in about 4 months (around 09/17/2020).  I have spent a total time of 31-minutes on the day of the appointment reviewing prior documentation, coordinating care and discussing medical diagnosis and plan with the patient/family. Imaging, labs and tests included in this note have been reviewed and interpreted independently by me.   [place], MD [facility] 05/18/2020 10:35 AM  Office Number [phone]

## 2020-05-18 NOTE — Patient Instructions (Addendum)
Asthma/Emphysema --CONTINUE Spiriva 2.5 mcg TWO puffs TWICE a day --USE albuterol as needed or before activity for shortness of breath or wheezing. Will provide prescription --CONTINUE Singulair 10 mg daily  History of TB exposure --External order for quantiferon gold   Follow-up in 4 months

## 2020-05-19 ENCOUNTER — Other Ambulatory Visit: Payer: Self-pay

## 2020-05-21 LAB — QUANTIFERON-TB GOLD PLUS
Mitogen-NIL: 7.06 IU/mL
NIL: 0.03 IU/mL
QuantiFERON-TB Gold Plus: NEGATIVE
TB1-NIL: 0.04 IU/mL
TB2-NIL: 0.02 IU/mL

## 2020-05-25 ENCOUNTER — Ambulatory Visit: Payer: Federal, State, Local not specified - PPO | Admitting: Cardiology

## 2020-05-26 ENCOUNTER — Telehealth: Payer: Self-pay | Admitting: Physician Assistant

## 2020-05-26 NOTE — Telephone Encounter (Signed)
Received a new hem referral from Dr. Laurann Montana for anemia. [name] [name] has been cld and scheduled to see [name] on 5/10 at 1pm. Pt aware to arrive 20 minutes early.

## 2020-05-30 ENCOUNTER — Inpatient Hospital Stay: Payer: Federal, State, Local not specified - PPO

## 2020-05-30 ENCOUNTER — Other Ambulatory Visit: Payer: Self-pay

## 2020-05-30 ENCOUNTER — Encounter: Payer: Self-pay | Admitting: Physician Assistant

## 2020-05-30 ENCOUNTER — Inpatient Hospital Stay: Payer: Federal, State, Local not specified - PPO | Attending: Physician Assistant | Admitting: Physician Assistant

## 2020-05-30 VITALS — BP 151/72 | HR 76 | Temp 97.8°F | Resp 17 | Wt 120.5 lb

## 2020-05-30 DIAGNOSIS — D631 Anemia in chronic kidney disease: Secondary | ICD-10-CM | POA: Diagnosis not present

## 2020-05-30 DIAGNOSIS — D508 Other iron deficiency anemias: Secondary | ICD-10-CM

## 2020-05-30 DIAGNOSIS — D509 Iron deficiency anemia, unspecified: Secondary | ICD-10-CM | POA: Diagnosis not present

## 2020-05-30 DIAGNOSIS — N183 Chronic kidney disease, stage 3 unspecified: Secondary | ICD-10-CM | POA: Diagnosis not present

## 2020-05-30 DIAGNOSIS — D649 Anemia, unspecified: Secondary | ICD-10-CM

## 2020-05-30 LAB — CMP (CANCER CENTER ONLY)
ALT: 48 U/L — ABNORMAL HIGH (ref 0–44)
AST: 75 U/L — ABNORMAL HIGH (ref 15–41)
Albumin: 3.5 g/dL (ref 3.5–5.0)
Alkaline Phosphatase: 287 U/L — ABNORMAL HIGH (ref 38–126)
Anion gap: 10 (ref 5–15)
BUN: 19 mg/dL (ref 8–23)
CO2: 31 mmol/L (ref 22–32)
Calcium: 9.5 mg/dL (ref 8.9–10.3)
Chloride: 99 mmol/L (ref 98–111)
Creatinine: 1.6 mg/dL — ABNORMAL HIGH (ref 0.44–1.00)
GFR, Estimated: 31 mL/min — ABNORMAL LOW (ref >60)
Glucose, Bld: 101 mg/dL — ABNORMAL HIGH (ref 70–99)
Potassium: 4.3 mmol/L (ref 3.5–5.1)
Sodium: 140 mmol/L (ref 135–145)
Total Bilirubin: 0.4 mg/dL (ref 0.3–1.2)
Total Protein: 7.2 g/dL (ref 6.5–8.1)

## 2020-05-30 LAB — CBC WITH DIFFERENTIAL (CANCER CENTER ONLY)
Abs Immature Granulocytes: 0.11 10*3/uL — ABNORMAL HIGH (ref 0.00–0.07)
Basophils Absolute: 0.1 10*3/uL (ref 0.0–0.1)
Basophils Relative: 1 %
Eosinophils Absolute: 0.2 10*3/uL (ref 0.0–0.5)
Eosinophils Relative: 3 %
HCT: 30.9 % — ABNORMAL LOW (ref 36.0–46.0)
Hemoglobin: 9.5 g/dL — ABNORMAL LOW (ref 12.0–15.0)
Immature Granulocytes: 2 %
Lymphocytes Relative: 33 %
Lymphs Abs: 2.4 10*3/uL (ref 0.7–4.0)
MCH: 27.9 pg (ref 26.0–34.0)
MCHC: 30.7 g/dL (ref 30.0–36.0)
MCV: 90.9 fL (ref 80.0–100.0)
Monocytes Absolute: 0.8 10*3/uL (ref 0.1–1.0)
Monocytes Relative: 11 %
Neutro Abs: 3.8 10*3/uL (ref 1.7–7.7)
Neutrophils Relative %: 50 %
Platelet Count: 254 10*3/uL (ref 150–400)
RBC: 3.4 MIL/uL — ABNORMAL LOW (ref 3.87–5.11)
RDW: 18.6 % — ABNORMAL HIGH (ref 11.5–15.5)
WBC Count: 7.3 10*3/uL (ref 4.0–10.5)
nRBC: 0 % (ref 0.0–0.2)

## 2020-05-30 LAB — FERRITIN: Ferritin: 29 ng/mL (ref 11–307)

## 2020-05-30 LAB — RETIC PANEL
Immature Retic Fract: 10 % (ref 2.3–15.9)
RBC.: 3.34 MIL/uL — ABNORMAL LOW (ref 3.87–5.11)
Retic Count, Absolute: 70.1 10*3/uL (ref 19.0–186.0)
Retic Ct Pct: 2.1 % (ref 0.4–3.1)
Reticulocyte Hemoglobin: 32.5 pg (ref >27.9)

## 2020-05-30 LAB — IRON AND TIBC
Iron: 176 ug/dL — ABNORMAL HIGH (ref 41–142)
Saturation Ratios: 42 % (ref 21–57)
TIBC: 416 ug/dL (ref 236–444)
UIBC: 240 ug/dL (ref 120–384)

## 2020-05-30 MED ORDER — FERROUS SULFATE 325 (65 FE) MG PO TBEC
325.0000 mg | DELAYED_RELEASE_TABLET | Freq: Every day | ORAL | 3 refills | Status: DC
Start: 2020-05-30 — End: 2020-11-07

## 2020-05-30 NOTE — Progress Notes (Signed)
[place] [phone]   [phone] [place] NOTE  Patient Care Team: Kelton Pillar, MD as PCP - General (Family Medicine) Adrian Prows, MD as PCP - Cardiology (Cardiology) Burnell Blanks, MD as PCP - Structural Heart (Cardiology)  Hematological/Oncological History 1) Labs from PCP, Dr. Kelton Pillar, [place] at [facility] 09/21/2019: WBC 7.3, Hgb 12.3, MCV 91.2, Plt 244 03/27/2020: WBC 14.9 (H), Hgb 8.5 (L), MCV 91.0, Plt 339, Iron 35 (L), Saturation 7% (L), Transferrin 335, TIBC 469 (H) 04/11/2020: WBC 16.1 (H), Hgb 9.2 (L), MCV 86.0, Plt 379 05/10/2020: WBC 7.6, Hgb 8.9 (L), MCV 65.2 (L), Plt 361  2) 05/30/2020: Establish care with Dede Query PA-C  CHIEF COMPLAINTS/PURPOSE OF CONSULTATION:  "Anemia"  HISTORY OF PRESENTING ILLNESS:  [name] 87 y.o. female with medical history significant for hypothyroidism, hypertension, bleeding, here stenosis status post TAVR, hiatal hernia, emphysema, coronary artery disease, CKD Stage 3, cirrhosis of the liver, and GERD.  Patient is unaccompanied for this visit.  On exam today, patient reports overall stable energy levels.  She denies any weight loss or changes to her diet.  Patient reports shortness of breath with exertion that does interfere with her daily activities.  Patient denies any nausea, vomiting or abdominal pain.  Her bowel movements are regular without any diarrhea or constipation.  She denies any easy bruising or signs of bleeding.  This includes hematochezia, melena, hematuria, epistaxis, hemoptysis or gingival bleeding.  Patient has lower extremity edema is managed with Lasix as needed with relief.  Patient denies any fevers, chills, chest pain, cough, skin changes or neuropathy.  She has no other complaints.  Rest of the 10 point ROS is below.  MEDICAL HISTORY:  Past Medical History:  Diagnosis Date  . Arthritis   . Asthma    history of  . CAD (coronary artery disease)   .  Cirrhosis ([place])   . CKD (chronic kidney disease)   . Dyspnea    sometimes sitting.lying and on exertion  . GERD (gastroesophageal reflux disease)   . History of hiatal hernia   . History of transcatheter aortic valve replacement (TAVR) 01/26/2019   (TAVR  23 mm Evolute Pro in aortic position 01/26/2019) .   Marland Kitchen Hyperlipidemia   . Hypertension   . Hypothyroidism   . Lipoma    left arm  . Nasal polyps   . Pneumonia   . Pulmonary nodule    noted on pre TAVR CT  . Severe aortic stenosis     SURGICAL HISTORY: Past Surgical History:  Procedure Laterality Date  . CHOLECYSTECTOMY  02/27/04  . CORONARY ATHERECTOMY N/A 01/05/2019   Procedure: CORONARY ATHERECTOMY;  Surgeon: Adrian Prows, MD;  Location: [facility];  Service: Cardiovascular;  Laterality: N/A;  . CORONARY BALLOON ANGIOPLASTY N/A 01/05/2019   Procedure: CORONARY BALLOON ANGIOPLASTY;  Surgeon: Adrian Prows, MD;  Location: [facility];  Service: Cardiovascular;  Laterality: N/A;  . CORONARY STENT INTERVENTION N/A 01/05/2019   Procedure: CORONARY STENT INTERVENTION;  Surgeon: Adrian Prows, MD;  Location: [facility];  Service: Cardiovascular;  Laterality: N/A;  . CORONARY STENT INTERVENTION  01/05/2019  . INCONTINENCE SURGERY  09/12/09  . NASAL SINUS SURGERY     several    . ORIF ANKLE FRACTURE Right 09/11/2013   Procedure: OPEN REDUCTION INTERNAL FIXATION (ORIF) ANKLE FRACTURE;  Surgeon: Augustin Schooling, MD;  Location: [facility];  Service: Orthopedics;  Laterality: Right;  . RIGHT/LEFT HEART CATH AND  CORONARY ANGIOGRAPHY N/A 12/15/2018   Procedure: RIGHT/LEFT HEART CATH AND CORONARY ANGIOGRAPHY;  Surgeon: Adrian Prows, MD;  Location: [facility];  Service: Cardiovascular;  Laterality: N/A;  . ROTATOR CUFF REPAIR  09/27/2003  . TRANSCATHETER AORTIC VALVE REPLACEMENT, TRANSFEMORAL  01/26/2019  . TRANSCATHETER AORTIC VALVE REPLACEMENT, TRANSFEMORAL N/A 01/26/2019   Procedure: TRANSCATHETER AORTIC VALVE REPLACEMENT,  TRANSFEMORAL;  Surgeon: Burnell Blanks, MD;  Location: [facility];  Service: Open Heart Surgery;  Laterality: N/A;  . TRIGGER FINGER RELEASE  10/29/06    SOCIAL HISTORY: Social History   Socioeconomic History  . Marital status: Married    Spouse name: Not on file  . Number of children: 3  . Years of education: Not on file  . Highest education level: Not on file  Occupational History  . Not on file  Tobacco Use  . Smoking status: Never Smoker  . Smokeless tobacco: Never Used  Vaping Use  . Vaping Use: Never used  Substance and Sexual Activity  . Alcohol use: No  . Drug use: No  . Sexual activity: Not on file  Other Topics Concern  . Not on file  Social History Narrative  . Not on file   Social Determinants of Health   Financial Resource Strain: Not on file  Food Insecurity: Not on file  Transportation Needs: Not on file  Physical Activity: Not on file  Stress: Not on file  Social Connections: Not on file  Intimate Partner Violence: Not on file    FAMILY HISTORY: Family History  Problem Relation Age of Onset  . Tuberculosis Mother   . Other Father        Bronchiectasis  . Cancer Maternal Grandmother        ovarian  . Sarcoidosis Sister   . Asthma Maternal Grandfather   . Heart disease Brother   . Heart attack Brother   . Breast cancer Daughter   . Brain cancer Daughter     ALLERGIES:  is allergic to aspirin, bee venom, ceclor [cefaclor], pneumococcal vac polyvalent, statins, penicillins, and sulfa antibiotics.  MEDICATIONS:  Current Outpatient Medications  Medication Sig Dispense Refill  . ferrous sulfate 325 (65 FE) MG EC tablet Take 1 tablet (325 mg total) by mouth daily with breakfast. 30 tablet 3  . albuterol (VENTOLIN HFA) 108 (90 Base) MCG/ACT inhaler Inhale 2 puffs into the lungs every 6 (six) hours as needed for shortness of breath (use as needed or before activity for shortness of breath or whezzing). 8 g 6  . amoxicillin (AMOXIL) 500 MG  capsule Take 500 mg by mouth as needed. Take medication only before dental procedures.    Marland Kitchen atorvastatin (LIPITOR) 10 MG tablet Take 1 tablet (10 mg total) by mouth daily. 90 tablet 3  . calcium-vitamin D (OSCAL WITH D) 250-125 MG-UNIT tablet Take 1 tablet by mouth daily.    . clopidogrel (PLAVIX) 75 MG tablet TAKE 1 TABLET BY MOUTH EVERY DAY (Patient taking differently: Take 75 mg by mouth daily.) 90 tablet 1  . fexofenadine (ALLEGRA) 180 MG tablet Take 180 mg by mouth daily.    . furosemide (LASIX) 20 MG tablet Take 1 tablet (20 mg total) by mouth daily. 90 tablet 3  . levothyroxine (SYNTHROID) 50 MCG tablet Take 50 mcg by mouth daily before breakfast.    . meclizine (ANTIVERT) 25 MG tablet Take 25 mg by mouth 3 (three) times daily as needed for dizziness.    . mometasone (NASONEX) 50 MCG/ACT nasal spray Place  2 sprays into the nose daily.    . montelukast (SINGULAIR) 10 MG tablet Take 1 tablet (10 mg total) by mouth daily. 30 tablet 6  . Multiple Vitamin (MULTIVITAMIN WITH MINERALS) TABS tablet Take 1 tablet by mouth daily.    . Peak Flow Meter DEVI 1 puff by Does not apply route in the morning and at bedtime. 1 each 0  . Polyethyl Glycol-Propyl Glycol (SYSTANE) 0.4-0.3 % SOLN Place 1 drop into both eyes 3 (three) times daily as needed (dry/irritated eyes.).    Marland Kitchen potassium chloride (KLOR-CON) 10 MEQ tablet Take 1 tablet (10 mEq total) by mouth daily. 90 tablet 3  . propranolol (INDERAL) 40 MG tablet Take 20 mg by mouth 3 (three) times daily.    . Tiotropium Bromide Monohydrate (SPIRIVA RESPIMAT) 2.5 MCG/ACT AERS Inhale 2 puffs into the lungs daily. 4 g 0  . Tiotropium Bromide Monohydrate (SPIRIVA RESPIMAT) 2.5 MCG/ACT AERS Inhale 2 puffs into the lungs daily. 4 g 6   No current facility-administered medications for this visit.    REVIEW OF SYSTEMS:   Constitutional: ( - ) fevers, ( - )  chills , ( - ) night sweats Eyes: ( - ) blurriness of vision, ( - ) double vision, ( - ) watery  eyes Ears, nose, mouth, throat, and face: ( - ) mucositis, ( - ) sore throat Respiratory: ( - ) cough, ( + ) dyspnea, ( - ) wheezes Cardiovascular: ( - ) palpitation, ( - ) chest discomfort, ( + ) lower extremity swelling Gastrointestinal:  ( - ) nausea, ( - ) heartburn, ( - ) change in bowel habits Skin: ( - ) abnormal skin rashes Lymphatics: ( - ) new lymphadenopathy, ( - ) easy bruising Neurological: ( - ) numbness, ( - ) tingling, ( - ) new weaknesses Behavioral/Psych: ( - ) mood change, ( - ) new changes  All other systems were reviewed with the patient and are negative.  PHYSICAL EXAMINATION: ECOG PERFORMANCE STATUS: 1 - Symptomatic but completely ambulatory  Vitals:   05/30/20 1252  BP: (!) 151/72  Pulse: 76  Resp: 17  Temp: 97.8 F (36.6 C)  SpO2: 98%   Filed Weights   05/30/20 1252  Weight: 120 lb 8 oz (54.7 kg)    GENERAL: well appearing female in NAD  SKIN: skin color, texture, turgor are normal, no rashes or significant lesions EYES: conjunctiva are pink and non-injected, sclera clear OROPHARYNX: no exudate, no erythema; lips, buccal mucosa, and tongue normal  NECK: supple, non-tender LYMPH:  no palpable lymphadenopathy in the cervical, axillary or supraclavicular lymph nodes.  LUNGS: clear to auscultation and percussion with normal breathing effort HEART: regular rate & rhythm and no murmurs and no lower extremity edema ABDOMEN: soft, non-tender, non-distended, normal bowel sounds Musculoskeletal: no cyanosis of digits and no clubbing  PSYCH: alert & oriented x 3, fluent speech NEURO: no focal motor/sensory deficits  LABORATORY DATA:  I have reviewed the data as listed CBC Latest Ref Rng & Units 05/30/2020 05/04/2020 02/23/2019  WBC 4.0 - 10.5 K/uL 7.3 9.9 8.6  Hemoglobin 12.0 - 15.0 g/dL 9.5(L) 9.1(L) 9.6(L)  Hematocrit 36.0 - 46.0 % 30.9(L) 29.2(L) 30.0(L)  Platelets 150 - 400 K/uL 254 361 287    CMP Latest Ref Rng & Units 05/30/2020 05/06/2020 05/05/2020   Glucose 70 - 99 mg/dL 101(H) 95 97  BUN 8 - 23 mg/dL '19 16 21  '$ Creatinine 0.44 - 1.00 mg/dL 1.60(H) 1.14(H) 1.32(H)  Sodium 135 -  145 mmol/L 140 135 134(L)  Potassium 3.5 - 5.1 mmol/L 4.3 3.8 3.7  Chloride 98 - 111 mmol/L 99 104 101  CO2 22 - 32 mmol/L '31 22 29  '$ Calcium 8.9 - 10.3 mg/dL 9.5 7.8(L) 7.6(L)  Total Protein 6.5 - 8.1 g/dL 7.2 5.0(L) -  Total Bilirubin 0.3 - 1.2 mg/dL 0.4 0.2(L) -  Alkaline Phos 38 - 126 U/L 287(H) 180(H) -  AST 15 - 41 U/L 75(H) 41 -  ALT 0 - 44 U/L 48(H) 27 -    RADIOGRAPHIC STUDIES: I have personally reviewed the radiological images as listed and agreed with the findings in the report. DG Chest 2 View  Result Date: 05/04/2020 CLINICAL DATA:  Short of breath EXAM: CHEST - 2 VIEW COMPARISON:  None 521 FINDINGS: Frontal and lateral views of the chest demonstrate stable aortic valve prosthesis. The cardiac silhouette is unremarkable. No acute airspace disease, effusion, or pneumothorax. Mild chronic interstitial scarring. Stable hiatal hernia. IMPRESSION: 1. No acute intrathoracic process. 2. Stable hiatal hernia. 3. Stable interstitial scarring. Electronically Signed   By: Randa Ngo M.D.   On: 05/04/2020 18:48   NM Pulmonary Perfusion  Result Date: 05/05/2020 CLINICAL DATA:  PE suspected, low to intermediate probability, positive D-dimer, history of CHF and mitral regurgitation EXAM: NUCLEAR MEDICINE PERFUSION LUNG SCAN TECHNIQUE: Perfusion images were obtained in multiple projections after intravenous injection of radiopharmaceutical. Ventilation scans intentionally deferred if perfusion scan and chest x-ray adequate for interpretation during COVID 19 epidemic. RADIOPHARMACEUTICALS:  4.1 mCi Tc-69mMAA IV COMPARISON:  Chest radiograph 05/04/2020 FINDINGS: No significant perfusion defects are identified on this perfusion only examination of the lungs. Review of the chest radiograph reveals some chronic interstitial scarring but otherwise clear lungs which  could account for some minimal heterogeneity of distribution. IMPRESSION: Pulmonary embolism absent (normal or very low probability) per the modified perfusion-only PIOPED II criteria Reference: Sostman HD, Miniati M, GGayleen OremPD, PGypsumSensitivity and specificity of perfusion scintigraphy combined with chest radiography for acute pulmonary embolism in PIOPED II. (2008) Journal of nuclear medicine : official publication, Society of Nuclear Medicine. 49 (11): 1741-8. doi:10.2967/jnumed.1999-72-3118Electronically Signed   By: PLovena LeM.D.   On: 05/05/2020 01:00   ECHOCARDIOGRAM LIMITED  Result Date: 05/06/2020    ECHOCARDIOGRAM LIMITED REPORT   Patient Name:   HCALIE SIEGELDate of Exam: 05/05/2020 Medical Rec #:  [id number]    Height:       60.0 in Accession #:    [id number]   Weight:       121.0 lb Date of Birth:  901-29-1933    BSA:          1.508 m Patient Age:    89years      BP:           103/55 mmHg Patient Gender: F             HR:           80 bpm. Exam Location:  Inpatient Procedure: Limited Echo, Color Doppler and Limited Color Doppler Indications:     Dyspnea  History:         Patient has prior history of Echocardiogram examinations, most                  recent 02/15/2020. CAD, Prior TAVR, Aortic Valve Disease; Risk  Factors:Hypertension and Dyslipidemia.  Sonographer:     Maudry Mayhew MHA, RDMS, RVT, RDCS Referring Phys:  [id number] RAVI PAHWANI Diagnosing Phys: Vernell Leep MD IMPRESSIONS  1. Left ventricular ejection fraction, by estimation, is 55 to 60%. The left ventricle has normal function. The left ventricle has no regional wall motion abnormalities. There is mild left ventricular hypertrophy. Left ventricular diastolic function could not be evaluated.  2. Right ventricular systolic function is normal. The right ventricular size is normal. There is normal pulmonary artery systolic pressure.  3. Well seated 23 mm Evolute Pro TAVR valve.  Mild paravalvular leak. Mean PG 14 mmhg only mildly higher than prior (9 mmHg). DVR of 0.44 suggests no significant aortic stenosis.  4. Mild mitral leaflet thickening. Trivial mitral regurgitation.  5. Mild to moderate tricuspid regurgitation. Estimated PASP 32 mmHg.  6. Mild pulmonic regurgitation.  7. Compared to previous outpatient study on 02/15/2020, no major chane noted. FINDINGS  Left Ventricle: Left ventricular ejection fraction, by estimation, is 55 to 60%. The left ventricle has normal function. The left ventricle has no regional wall motion abnormalities. There is mild left ventricular hypertrophy. Left ventricular diastolic  function could not be evaluated. Right Ventricle: The right ventricular size is normal. No increase in right ventricular wall thickness. Right ventricular systolic function is normal. There is normal pulmonary artery systolic pressure. The tricuspid regurgitant velocity is 2.69 m/s, and  with an assumed right atrial pressure of 3 mmHg, the estimated right ventricular systolic pressure is 123XX123 mmHg. Left Atrium: Left atrial size was normal in size. Right Atrium: Right atrial size was normal in size. Mitral Valve: The mitral valve is abnormal. There is mild thickening of the mitral valve leaflet(s). Trivial mitral valve regurgitation. Tricuspid Valve: The tricuspid valve is grossly normal. Tricuspid valve regurgitation is mild to moderate. Aortic Valve: Aortic valve regurgitation Mild paravalvular leak. Aortic valve mean gradient measures 14.0 mmHg. Aortic valve peak gradient measures 25.8 mmHg. Aortic valve area, by VTI measures 0.91 cm. There is a 23 mm CoreValve-Evolut Pro prosthetic, stented (TAVR) valve present in the aortic position. Pulmonic Valve: The pulmonic valve was grossly normal. Pulmonic valve regurgitation is mild. Aorta: The aortic root and ascending aorta are structurally normal, with no evidence of dilitation. Venous: The inferior vena cava is normal in size with  greater than 50% respiratory variability, suggesting right atrial pressure of 3 mmHg. IAS/Shunts: No atrial level shunt detected by color flow Doppler. LEFT VENTRICLE PLAX 2D LVIDd:         3.00 cm LVIDs:         2.10 cm LV PW:         0.70 cm LV IVS:        1.10 cm LVOT diam:     1.50 cm LV SV:         44 LV SV Index:   29 LVOT Area:     1.77 cm  LEFT ATRIUM             Index       RIGHT ATRIUM          Index LA diam:        2.30 cm 1.53 cm/m  RA Area:     9.75 cm LA Vol (A2C):   12.6 ml 8.36 ml/m  RA Volume:   17.30 ml 11.47 ml/m LA Vol (A4C):   45.3 ml 30.05 ml/m LA Biplane Vol: 24.2 ml 16.05 ml/m  AORTIC VALVE AV Area (Vmax):  0.77 cm AV Area (Vmean):   0.76 cm AV Area (VTI):     0.91 cm AV Vmax:           254.00 cm/s AV Vmean:          179.000 cm/s AV VTI:            0.479 m AV Peak Grad:      25.8 mmHg AV Mean Grad:      14.0 mmHg LVOT Vmax:         111.00 cm/s LVOT Vmean:        77.400 cm/s LVOT VTI:          0.248 m LVOT/AV VTI ratio: 0.52 TRICUSPID VALVE TR Peak grad:   28.9 mmHg TR Vmax:        269.00 cm/s  SHUNTS Systemic VTI:  0.25 m Systemic Diam: 1.50 cm Vernell Leep MD Electronically signed by Vernell Leep MD Signature Date/Time: 05/06/2020/9:05:45 AM    Final    PCV MYOCARDIAL PERFUSION WITH LEXISCAN  Result Date: 05/15/2020 Lexiscan Tetrofosmin Stress Test  05/15/2020: Nondiagnostic ECG stress with lexiscan infusion. Resting EKG demonstrated normal sinus rhythm with left bundle branch block (IVCD). Peak EKG revealed no significant ST-T change from baseline abnormality. Myocardial perfusion is normal. Overall LV systolic function is normal without regional wall motion abnormalities. Stress LV EF: 69%. No change from 04/25/2017. Low risk.    ASSESSMENT & PLAN TASHIYAH ORREN is a 87 y.o. female presenting to the clinic for evaluation for anemia. Per review of outside labs from March 2022, there is evidence of iron deficiency anemia with Iron 35 (L) and saturation of 7% (L).  Patient notes history of iron deficiency anemia in the past but is currently not taking any oral iron supplementation at this time. Recommend to proceed with labs today to check CBC, CMP, iron and TIBC, ferritin and retic panel. Based on labs from March 2022, I recommend starting ferrous sulfate 325 mg once daily which I sent to local pharmacy.   # Normocytic anemia: --Likely multifactorial secondary to iron deficiency and CKD.  --Labs today to check CBC, CMP, iron and TIBC, ferritin and retic panel --Recommend to start ferrous sulfate 325 mg once daily --Patient denies any bleeding but advised to continue to follow up with Dr. Wilford [facility]) for consideration of repeat EGD/colonoscopy.  --RTC in 3 months with repeat labs.    Orders Placed This Encounter  Procedures  . CBC with Differential (Cancer Center Only)    Standing Status:   Future    Number of Occurrences:   1    Standing Expiration Date:   05/29/2021  . CMP ([place] only)    Standing Status:   Future    Number of Occurrences:   1    Standing Expiration Date:   05/29/2021  . Ferritin    Standing Status:   Future    Number of Occurrences:   1    Standing Expiration Date:   05/29/2021  . Iron and TIBC    Standing Status:   Future    Number of Occurrences:   1    Standing Expiration Date:   05/29/2021  . Retic Panel    Standing Status:   Future    Number of Occurrences:   1    Standing Expiration Date:   05/29/2021    All questions were answered. The patient knows to call the clinic with any problems, questions or concerns.  A total  of more than 60 minutes were spent on this encounter and over half of that time was spent on counseling and coordination of care as outlined above.    Dede Query, PA-C Department of Hematology/Oncology [place] at [facility] Phone: [phone]  Patient was seen with Dr. Lorenso Courier.   I have read the above note and personally examined the patient. I  agree with the assessment and plan as noted above.  Briefly Mrs. Kunka is an 87 year old Caucasian female with medical history significant for iron deficiency anemia.  The etiology of her iron deficiency anemia is unclear, though we strongly suspect GI bleed.  Patient is currently under the care of of Dr. Michail Sermon at [facility] and would recommend continued follow-up with him.  In order to help bolster the patient's iron levels improve her iron deficiency anemia we recommend ferrous sulfate 325 mg p.o. daily with a source of vitamin C.  We will see her back in 3 months for further evaluation.   Ledell Peoples, MD Department of Hematology/Oncology [place] at [facility] Phone: [phone] Pager: [phone] Email: [email or web address]

## 2020-05-31 ENCOUNTER — Encounter: Payer: Self-pay | Admitting: Physician Assistant

## 2020-05-31 DIAGNOSIS — D649 Anemia, unspecified: Secondary | ICD-10-CM | POA: Insufficient documentation

## 2020-05-31 DIAGNOSIS — D509 Iron deficiency anemia, unspecified: Secondary | ICD-10-CM | POA: Insufficient documentation

## 2020-06-03 ENCOUNTER — Encounter: Payer: Self-pay | Admitting: Pulmonary Disease

## 2020-06-03 DIAGNOSIS — R0602 Shortness of breath: Secondary | ICD-10-CM | POA: Insufficient documentation

## 2020-06-03 NOTE — Progress Notes (Signed)
Dr. Irven Shelling patient. Recently seen by me in the hospital. Echocardiogram results, FYI. Per my knowledge, patient is going to be seeing pulmonology for shortness of breath.   Thanks MJP

## 2020-06-06 ENCOUNTER — Encounter: Payer: Self-pay | Admitting: Dietician

## 2020-06-06 ENCOUNTER — Encounter: Payer: Federal, State, Local not specified - PPO | Attending: Student | Admitting: Dietician

## 2020-06-06 ENCOUNTER — Other Ambulatory Visit: Payer: Self-pay

## 2020-06-06 DIAGNOSIS — N1832 Chronic kidney disease, stage 3b: Secondary | ICD-10-CM | POA: Diagnosis present

## 2020-06-06 DIAGNOSIS — E639 Nutritional deficiency, unspecified: Secondary | ICD-10-CM

## 2020-06-06 DIAGNOSIS — D649 Anemia, unspecified: Secondary | ICD-10-CM | POA: Diagnosis present

## 2020-06-06 NOTE — Patient Instructions (Addendum)
Avoid added salt  Season with onion, garlic, herbs  Pinch of salt at the table may be OK  Continue to monitor your weight and fluid status Be sure to drink enough to stay hydrated.  Crangrape juice, real lemonade have calories so can be good choices Add olive oil to season and to your salad to add additional calories.  Choose regular products that are not low fat  [place]  Salad dressing   Whole milk yogurt (not [place]) Smoothie rather than Boost if able otherwise regular Boost is OK.  Low sodium pasta sauce:    Low Sodium Silver Palate Verdene Rio

## 2020-06-06 NOTE — Progress Notes (Signed)
Medical Nutrition Therapy  Appointment Start time:  [id number]  Appointment End time:  [id number]  Primary concerns today: Patient is here today with her daughter Amy who is a Designer, jewellery.   They would like to learn max calories in less volume to improve her weight. She is drinking 1 Boost daily. Patient states that she does not have an appetite and has not had a sense of smell for years.  Referral diagnosis: abnormal weight loss Preferred learning style: no preference indicated Learning readiness: ready, change in progress   NUTRITION ASSESSMENT   Anthropometrics  119 lbs 06/06/2020 135 lbs 2019/20 Lost weight with increased pulmonary and cardiac issues  Clinical Medical Hx: Patients daughter reports patient with hiatal hernia that has pushed Kariah's stomach into her chest cavity, decreased lung capacity, fatty liver, CHF due to mitral valve issues now requiring lasix.  She has had worsening kidney function (now CKD stage 3b) and anemia.  Constipation due to iron supplement.  Vitamin D at max range.  She is on a Calcium/D supplement and MVI but has stopped the vitamin D supplement.  Expected MTHFR gene mutation due to daughter's known positive. When she eats too much she has increased shortness of breath.  Medications: includes Lasix Labs: GFR 31, AST 75, ALT 48 05/30/2020, Hgb 9.6% 04/17/2020 Notable Signs/Symptoms: appears well nourished, independent  Lifestyle & Dietary Hx Patient lives with her husband.  She does cooking from scratch.  Her husband has started having problems with aspiration (to be further worked up by SLP) and he likes 3 meals per day with meat, vegetables, starch.   She uses a small amount of sea salt.  Estimated daily fluid intake: 32 oz Supplements: MIV, calcium/D   24-Hr Dietary Recall Enjoys snacking on low sodium nuts, peanut butter, fruit Cooks many foods from scratch with some processed starches (seasoned rice) Beverages: (32 oz fluid daily) water, decaf  tea with stevia,4 oz OJ, occasional smoothie (juice, fruit, activia yogurt)  Estimated Energy Needs Calories: 1600 Protein: 45g  NUTRITION DIAGNOSIS  NB-1.1 Food and nutrition-related knowledge deficit As related to multiple diagnosis (CKD, CHF, increased LFT's,  poor appetite).  As evidenced by diet hx and patient report.  NUTRITION INTERVENTION  Nutrition education (E-1) on the following topics:  . Impact of nutrition on CKD and LFT's . Recommendation for a lower protein diet but adequate nutrition.  Most importantly adequate nutrition. . Low sodium guidelines . Reading labels for low sodium . Cooking with less salt and seasoning tips . Avoid all Salt substitutes and lite salt such as "No Salt, Nu Salt, Lite salt, and other potassium containing salt substitutes . No need to restrict potassium in food currently as potassium lab is normal . Avoidance of foods with Phos.... in the ingredient list. . Importance of adequate hydration . Supplement options . Sources of higher caloric density  Handouts Provided Include   Underweight nutrition therapy  Label reading  Low sodium nutrition therapy from AND  Seasoning tips  Learning Style & Readiness for Change Teaching method utilized: Visual & Auditory  Demonstrated degree of understanding via: Teach Back  Barriers to learning/adherence to lifestyle change: none  Plan: Avoid added salt1  Season with onion, garlic, herbs  Pinch of salt at the table may be OK  Continue to monitor your weight and fluid status Be sure to drink enough to stay hydrated.  Crangrape juice, real lemonade have calories so can be good choices Add olive oil to season and to your salad to  add additional calories.  Choose regular products that are not low fat  Mayo  Salad dressing   Whole milk yogurt (not [place]) Smoothie rather than Boost if able otherwise regular Boost is OK.  Low sodium pasta sauce:    Low Sodium Silver Palate  Marinara   MONITORING & EVALUATION Dietary intake, weekly physical activity, and label reading in 2.  Next Steps  Patient is to call for questions as needed.

## 2020-06-07 ENCOUNTER — Other Ambulatory Visit: Payer: Self-pay | Admitting: Cardiology

## 2020-06-07 DIAGNOSIS — I2510 Atherosclerotic heart disease of native coronary artery without angina pectoris: Secondary | ICD-10-CM

## 2020-06-14 ENCOUNTER — Other Ambulatory Visit: Payer: Self-pay | Admitting: Cardiology

## 2020-06-14 DIAGNOSIS — I5033 Acute on chronic diastolic (congestive) heart failure: Secondary | ICD-10-CM

## 2020-06-22 NOTE — Progress Notes (Signed)
Primary Physician/Referring:  Kelton Pillar, MD  Patient ID: [name] [name], female    DOB: 08/11/1931, 87 y.o.   MRN: [id number]  Chief Complaint  Patient presents with  . Chronic diastolic (congestive) heart failure  . Exertional dyspnea  . Follow-up   HPI:    [name] [name]  is a 87 y.o. Caucasian female with history of benign positional vertigo, chronic dyspnea, chronic back pain, hypertension, mixed hyperlipidemia and  symptomatic aortic stenosis, CAD S/P PCI to LAD/D1  on 01/05/2019. Underwent elective TAVR on 01/26/2019, mild to moderate paravalvular leak. She has aspirin anaphylaxis hence only on Plavix.  Patient presents for 6 week follow up of dyspnea on exertion.  Since last visit patient completed nuclear stress test, which was low risk with normal myocardial perfusion.  Patient reports since last visit she has been following with ENT and pulmonology, her shortness of breath has significantly improved with addition of Spiriva.  She also has upcoming appointment with GI for further evaluation of large hiatal hernia.  Patient denies chest pain, syncope, near syncope, palpitations, leg swelling, orthopnea, PND.  Past Medical History:  Diagnosis Date  . Arthritis   . Asthma    history of  . CAD (coronary artery disease)   . Cirrhosis ([place])   . CKD (chronic kidney disease)   . Dyspnea    sometimes sitting.lying and on exertion  . GERD (gastroesophageal reflux disease)   . History of hiatal hernia   . History of transcatheter aortic valve replacement (TAVR) 01/26/2019   (TAVR  23 mm Evolute Pro in aortic position 01/26/2019) .   Marland Kitchen Hyperlipidemia   . Hypertension   . Hypothyroidism   . Lipoma    left arm  . Nasal polyps   . Pneumonia   . Pulmonary nodule    noted on pre TAVR CT  . Severe aortic stenosis    Past Surgical History:  Procedure Laterality Date  . CHOLECYSTECTOMY  02/27/04  . CORONARY ATHERECTOMY N/A 01/05/2019   Procedure: CORONARY ATHERECTOMY;  Surgeon:  Adrian Prows, MD;  Location: [facility];  Service: Cardiovascular;  Laterality: N/A;  . CORONARY BALLOON ANGIOPLASTY N/A 01/05/2019   Procedure: CORONARY BALLOON ANGIOPLASTY;  Surgeon: Adrian Prows, MD;  Location: [facility];  Service: Cardiovascular;  Laterality: N/A;  . CORONARY STENT INTERVENTION N/A 01/05/2019   Procedure: CORONARY STENT INTERVENTION;  Surgeon: Adrian Prows, MD;  Location: [facility];  Service: Cardiovascular;  Laterality: N/A;  . CORONARY STENT INTERVENTION  01/05/2019  . INCONTINENCE SURGERY  09/12/09  . NASAL SINUS SURGERY     several    . ORIF ANKLE FRACTURE Right 09/11/2013   Procedure: OPEN REDUCTION INTERNAL FIXATION (ORIF) ANKLE FRACTURE;  Surgeon: Augustin Schooling, MD;  Location: [facility];  Service: Orthopedics;  Laterality: Right;  . RIGHT/LEFT HEART CATH AND CORONARY ANGIOGRAPHY N/A 12/15/2018   Procedure: RIGHT/LEFT HEART CATH AND CORONARY ANGIOGRAPHY;  Surgeon: Adrian Prows, MD;  Location: [facility];  Service: Cardiovascular;  Laterality: N/A;  . ROTATOR CUFF REPAIR  09/27/2003  . TRANSCATHETER AORTIC VALVE REPLACEMENT, TRANSFEMORAL  01/26/2019  . TRANSCATHETER AORTIC VALVE REPLACEMENT, TRANSFEMORAL N/A 01/26/2019   Procedure: TRANSCATHETER AORTIC VALVE REPLACEMENT, TRANSFEMORAL;  Surgeon: Burnell Blanks, MD;  Location: [facility];  Service: Open Heart Surgery;  Laterality: N/A;  . TRIGGER FINGER RELEASE  10/29/06   Family History  Problem Relation Age of Onset  . Tuberculosis Mother   . Other Father  Bronchiectasis  . Cancer Maternal Grandmother        ovarian  . Sarcoidosis Sister   . Asthma Maternal Grandfather   . Heart disease Brother   . Heart attack Brother   . Breast cancer Daughter   . Brain cancer Daughter     Social History   Tobacco Use  . Smoking status: Never Smoker  . Smokeless tobacco: Never Used  Substance Use Topics  . Alcohol use: No    Marital Status: Married  ROS  Review of Systems   Constitutional: Negative for malaise/fatigue and weight gain.  Cardiovascular: Positive for dyspnea on exertion (improving). Negative for chest pain, claudication, leg swelling, near-syncope, orthopnea, palpitations, paroxysmal nocturnal dyspnea and syncope.  Respiratory: Positive for wheezing (occasional and chronic).   Hematologic/Lymphatic: Does not bruise/bleed easily.  Musculoskeletal: Positive for arthritis, back pain and joint pain.  Gastrointestinal: Negative for melena.  Neurological: Negative for dizziness and weakness.   Objective  Blood pressure 113/71, pulse 82, temperature 97.8 F (36.6 C), resp. rate 16, height 5' (1.524 m), weight 117 lb (53.1 kg), SpO2 96 %. Body mass index is 22.85 kg/m.   Vitals with BMI 06/23/2020 06/06/2020 05/30/2020  Height _0  _1  -  Weight 117 lbs 119 lbs 120 lbs 8 oz  BMI 22.85 78.29 56.21  Systolic 308 - 657  Diastolic 71 - 72  Pulse 82 - 76    Physical Exam Vitals reviewed.  Constitutional:      General: She is not in acute distress.    Appearance: She is well-developed.  Cardiovascular:     Rate and Rhythm: Normal rate and regular rhythm.     Pulses: Intact distal pulses.          Carotid pulses are on the right side with bruit and on the left side with bruit.      Popliteal pulses are 2+ on the right side and 2+ on the left side.       Dorsalis pedis pulses are 2+ on the right side and 2+ on the left side.       Posterior tibial pulses are 2+ on the right side and 2+ on the left side.     Heart sounds: S1 normal and S2 normal. Murmur heard.   Harsh midsystolic murmur is present with a grade of 3/6 at the upper right sternal border radiating to the neck. No gallop.      Comments: No JVD.   Pulmonary:     Effort: Pulmonary effort is normal. No respiratory distress.     Breath sounds: Normal breath sounds. No wheezing, rhonchi or rales.  Musculoskeletal:     Right lower leg: Edema (trace) present.     Left lower leg: Edema  (trace) present.  Neurological:     Mental Status: She is alert.    Laboratory examination:   Recent Labs    11/30/19 1007 05/04/20 1724 05/05/20 0650 05/06/20 0111 05/30/20 1353  NA 138   < > 134* 135 140  K 4.5   < > 3.7 3.8 4.3  CL 101   < > 101 104 99  CO2 24   < > _2 GLUCOSE 104*   < > 97 95 101*  BUN 18   < > _3 CREATININE 1.36*   < > 1.32* 1.14* 1.60*  CALCIUM 9.7   < > 7.6* 7.8* 9.5  GFRNONAA 35*   < > 39* 47* 31*  GFRAA 41*  --   --   --   --    < > =  values in this interval not displayed.   CMP Latest Ref Rng & Units 05/30/2020 05/06/2020 05/05/2020  Glucose 70 - 99 mg/dL 101(H) 95 97  BUN 8 - 23 mg/dL _0 Creatinine 0.44 - 1.00 mg/dL 1.60(H) 1.14(H) 1.32(H)  Sodium 135 - 145 mmol/L 140 135 134(L)  Potassium 3.5 - 5.1 mmol/L 4.3 3.8 3.7  Chloride 98 - 111 mmol/L 99 104 101  CO2 22 - 32 mmol/L _1 Calcium 8.9 - 10.3 mg/dL 9.5 7.8(L) 7.6(L)  Total Protein 6.5 - 8.1 g/dL 7.2 5.0(L) -  Total Bilirubin 0.3 - 1.2 mg/dL 0.4 0.2(L) -  Alkaline Phos 38 - 126 U/L 287(H) 180(H) -  AST 15 - 41 U/L 75(H) 41 -  ALT 0 - 44 U/L 48(H) 27 -   CBC Latest Ref Rng & Units 05/30/2020 05/04/2020 02/23/2019  WBC 4.0 - 10.5 K/uL 7.3 9.9 8.6  Hemoglobin 12.0 - 15.0 g/dL 9.5(L) 9.1(L) 9.6(L)  Hematocrit 36.0 - 46.0 % 30.9(L) 29.2(L) 30.0(L)  Platelets 150 - 400 K/uL 254 361 287   Lipid Panel Recent Labs    11/30/19 1007  CHOL 163  TRIG 83  LDLCALC 85  HDL 62    HEMOGLOBIN A1C Lab Results  Component Value Date   HGBA1C 4.9 01/21/2019   MPG 93.93 01/21/2019   TSH No results for input(s): TSH in the last 8760 hours.  External Labs:  02/15/2020: LFTs normal, serum glucose 113 mg, BUN 25, creatinine 1.27, EGFR 40 mL, sodium 137, potassium 3.7.  Alkaline phosphatase 518.  Hb 12.3/HCT 35.9, platelets 244.  Cholesterol, total 212.000 09/21/2019 HDL 65.000 09/21/2019 LDL-C 130.000 09/21/2019 Triglycerides 97.000 09/21/2019  A1C 4.900 01/21/2019 TSH  2.070 09/21/2019  Hemoglobin 12.300 09/21/2019  Creatinine, Serum 1.260 09/21/2019 Potassium 4.800 09/21/2019 Magnesium N/D ALT (SGPT) 59.000 09/21/2019  Hemoglobin 10.400 03/22/2019; INR 1.100 01/21/2019 Platelets 287.000 02/23/2019  Creatinine, Serum 1.250 03/22/2019 Potassium 5.100 03/22/2019 ALT (SGPT) 47.000 03/22/2019  TSH 3.200 03/22/2019  07/25/2017: Hemoglobin A1c 9%. Creatinine 1.16, EGFR 54, potassium 4.5, alkaline loss 420, AST 55, CMP otherwise normal. ( PCP has held Zetia and simvastatin for 2 months due to elevated LFTs). Hemoglobin 11.8.   Cholesterol 157, triglycerides 105, HDL 60, LDL 78.  05/27/2017: Cholesterol 208, triglycerides 84, HDL 81, LDL 110. Allergies   Allergies  Allergen Reactions  . Aspirin Anaphylaxis  . Bee Venom Anaphylaxis  . Ceclor [Cefaclor] Anaphylaxis  . Pneumococcal Vac Polyvalent     Other reaction(s): swelling and erythema  . Statins Other (See Comments)    Elevated liver enzymes  . Penicillins Rash    Has patient had a PCN reaction causing immediate rash, facial/tongue/throat swelling, SOB or lightheadedness with hypotension: Yes Has patient had a PCN reaction causing severe rash involving mucus membranes or skin necrosis: No Has patient had a PCN reaction that required hospitalization No Has patient had a PCN reaction occurring within the last 10 years: No If all of the above answers are "NO", then may proceed with Cephalosporin use.  . Sulfa Antibiotics Rash      Medications Prior to Visit:   Outpatient Medications Prior to Visit  Medication Sig Dispense Refill  . albuterol (VENTOLIN HFA) 108 (90 Base) MCG/ACT inhaler Inhale 2 puffs into the lungs every 6 (six) hours as needed for shortness of breath (use as needed or before activity for shortness of breath or whezzing). 8 g 6  . atorvastatin (LIPITOR) 10 MG tablet Take 1 tablet (10 mg total) by mouth  daily. 90 tablet 3  . calcium-vitamin D (OSCAL WITH D) 250-125 MG-UNIT tablet Take 1 tablet  by mouth daily.    . clopidogrel (PLAVIX) 75 MG tablet TAKE 1 TABLET BY MOUTH EVERY DAY 90 tablet 1  . ferrous sulfate 325 (65 FE) MG EC tablet Take 1 tablet (325 mg total) by mouth daily with breakfast. 30 tablet 3  . fexofenadine (ALLEGRA) 180 MG tablet Take 180 mg by mouth daily.    . furosemide (LASIX) 20 MG tablet PLEASE SEE ATTACHED FOR DETAILED DIRECTIONS 180 tablet 0  . levothyroxine (SYNTHROID) 50 MCG tablet Take 50 mcg by mouth daily before breakfast.    . meclizine (ANTIVERT) 25 MG tablet Take 25 mg by mouth 3 (three) times daily as needed for dizziness.    . mometasone (NASONEX) 50 MCG/ACT nasal spray Place 2 sprays into the nose daily.    . montelukast (SINGULAIR) 10 MG tablet Take 1 tablet (10 mg total) by mouth daily. 30 tablet 6  . Multiple Vitamin (MULTIVITAMIN WITH MINERALS) TABS tablet Take 1 tablet by mouth daily.    . Peak Flow Meter DEVI 1 puff by Does not apply route in the morning and at bedtime. 1 each 0  . Polyethyl Glycol-Propyl Glycol (SYSTANE) 0.4-0.3 % SOLN Place 1 drop into both eyes 3 (three) times daily as needed (dry/irritated eyes.).    Marland Kitchen potassium chloride (KLOR-CON) 10 MEQ tablet Take 1 tablet (10 mEq total) by mouth daily. 90 tablet 3  . propranolol (INDERAL) 40 MG tablet Take 20 mg by mouth 3 (three) times daily.    . Tiotropium Bromide Monohydrate (SPIRIVA RESPIMAT) 2.5 MCG/ACT AERS Inhale 2 puffs into the lungs daily. 4 g 0  . amoxicillin (AMOXIL) 500 MG capsule Take 500 mg by mouth as needed. Take medication only before dental procedures.    . Tiotropium Bromide Monohydrate (SPIRIVA RESPIMAT) 2.5 MCG/ACT AERS Inhale 2 puffs into the lungs daily. 4 g 6   No facility-administered medications prior to visit.     Final Medications at End of Visit    Current Meds  Medication Sig  . albuterol (VENTOLIN HFA) 108 (90 Base) MCG/ACT inhaler Inhale 2 puffs into the lungs every 6 (six) hours as needed for shortness of breath (use as needed or before activity  for shortness of breath or whezzing).  Marland Kitchen atorvastatin (LIPITOR) 10 MG tablet Take 1 tablet (10 mg total) by mouth daily.  . calcium-vitamin D (OSCAL WITH D) 250-125 MG-UNIT tablet Take 1 tablet by mouth daily.  . clopidogrel (PLAVIX) 75 MG tablet TAKE 1 TABLET BY MOUTH EVERY DAY  . ferrous sulfate 325 (65 FE) MG EC tablet Take 1 tablet (325 mg total) by mouth daily with breakfast.  . fexofenadine (ALLEGRA) 180 MG tablet Take 180 mg by mouth daily.  . furosemide (LASIX) 20 MG tablet PLEASE SEE ATTACHED FOR DETAILED DIRECTIONS  . levothyroxine (SYNTHROID) 50 MCG tablet Take 50 mcg by mouth daily before breakfast.  . meclizine (ANTIVERT) 25 MG tablet Take 25 mg by mouth 3 (three) times daily as needed for dizziness.  . mometasone (NASONEX) 50 MCG/ACT nasal spray Place 2 sprays into the nose daily.  . montelukast (SINGULAIR) 10 MG tablet Take 1 tablet (10 mg total) by mouth daily.  . Multiple Vitamin (MULTIVITAMIN WITH MINERALS) TABS tablet Take 1 tablet by mouth daily.  . Peak Flow Meter DEVI 1 puff by Does not apply route in the morning and at bedtime.  Vladimir Faster Glycol-Propyl Glycol (SYSTANE) 0.4-0.3 %  SOLN Place 1 drop into both eyes 3 (three) times daily as needed (dry/irritated eyes.).  Marland Kitchen potassium chloride (KLOR-CON) 10 MEQ tablet Take 1 tablet (10 mEq total) by mouth daily.  . propranolol (INDERAL) 40 MG tablet Take 20 mg by mouth 3 (three) times daily.  . Tiotropium Bromide Monohydrate (SPIRIVA RESPIMAT) 2.5 MCG/ACT AERS Inhale 2 puffs into the lungs daily.  . [DISCONTINUED] amoxicillin (AMOXIL) 500 MG capsule Take 500 mg by mouth as needed. Take medication only before dental procedures.     Radiology:   CT cardiac morphology 03/14/2019: 1. Well positioned 23 mm Evolut Pro valve with normal appearing leaflets. Good coaptation with no HALT/HAM. Although target diameters for inflow, waist, outflow in systole are slightly low the mechanism of worsening PVL appears to be primarily a  large area of calcification posterior to the stent valve from the native non coronary cusp. This area of calcium measures 4.9 x 5.7 mm and is best seen on SA images at 7:00.  Extracardiac:  1. Right middle lobe nodule is stable from 09/12/2018. Additional follow-up CT chest without contrast in 12 months is recommended to ensure stability and exclude malignancy. This recommendation follows the consensus statement: Guidelines for Management of Small Pulmonary Nodules Detected on CT Images: From the Fleischner Society 2017; Radiology 2017; 284:228-243. 2.  Aortic atherosclerosis (ICD10-I70.0). 3. Large hiatal hernia.  Cardiac Studies:    Exercise myoview stress 04/25/2017: 1. The patient performed treadmill exercise using a Bruce protocol, completing 5 minutes. The patient completed an estimated workload of 4.88 METS, reaching 103% of the maximum predicted heart rate. Stress symptoms included dyspnea. Exercise capacity fair for age. Normal hemodynamic response. Possible old anteroseptal infarct. Poor R wave progression. No ischemic changes on stress electrocardiogram. 2. The overall quality of the study is excellent. There is no evidence of abnormal lung activity. Stress and rest SPECT images demonstrate homogeneous tracer distribution throughout the myocardium. Gated SPECT imaging reveals normal myocardial thickening and wall motion. The left ventricular ejection fraction was normal (77%). 3. Low risk study.  Right and left heart catheterization 12/15/2018: RA 5/5, mean 5; RV 31/3, EDP 6; PA 32/14, mean 21, PA saturation 67%; PW 11/10, mean 10 mmHg.  CO 3.84, CI  2.5756. QP/QS 1.0.  Normal right heart catheterization. Mildly elevated LVEDP at 15 mmHg.  Peak aortic valve gradient of 27, mean 23.6 mmHg with calculated aortic valve area 0.76 cm.  Findings consistent with severe calcific degenerative aortic valve stenosis. Left dominant circulation, proximal LAD 80% stenosis, large D1 with ostial  90% stenosis, medium sized RI with a proximal 90% stenosis, dominant circumflex mild disease.  RCA mild disease and nondominant.  Recommendation: Patient will need to be evaluated for CABG and aortic valve replacement.  If the TAVR team feels she is a good candidate for TAVR, we could certainly consider either medical management of CAD or PCI to proximal LAD and TAVR. Patient be discharged home today with outpatient follow-up.  Patient has been referred to be evaluated by Dr. Gilford Raid. 30 mL contrast utilized.  Left Heart Catheterization 01/05/2019:  Successful orbital atherectomy followed by stenting of the proximal LAD with a 3.0 x 18 mm resolute Onyx DES and proximal segment postdilated with a 3.5 x 8 mm Sapphire [place] Chain.  Stenosis 90% to 0%.  TIMI-3 to TIMI-3 flow.  Orbital atherectomy followed by balloon angioplasty with 2.5 x 15 mm Sapphire balloon for D1, 90% to less than 15 to 20% stenosis.  TIMI-3 to TIMI-3 flow.  01/26/2019 (  Dr. Angelena Form):  Transcatheter Aortic Valve Replacement - Transfemoral Approach Medtronic Evolut Pro PlusTHV (size64m, model #[id number], serial ##[id number]   Carotid artery duplex 02/15/2020: Doppler velocity suggests stenosis in the right internal carotid artery (50-69%). Stenosis in the right external carotid artery (<50%). Doppler velocity suggests stenosis in the left internal carotid artery (16-49%). Stenosis in the left external carotid artery (<50%). Antegrade right vertebral artery flow. Antegrade left vertebral artery flow. Compared to 09/28/2019, there is progression of disease severity in right and regression in the left carotid artery. Follow up in six months is appropriate if clinically indicated. EKG 05/05/2020: Sinus rhythm 70 bpm.  Left bundle branch block.  CTA chest 03/05/2019: 1. Well positioned 23 mm Evolut Pro valve with normal appearing leaflets. Good coaptation with no HALT/HAM. Although target diameters for inflow, waist,  outflow in systole are slightly low the mechanism of worsening PVL appears to be primarily a large area of calcification posterior to the stent valve from the native non coronary cusp. This area of calcium measures 4.9 x 5.7 mm and is best seen on SA images at 7:00.  Echocardiogram 05/05/2020: 1. Left ventricular ejection fraction, by estimation, is 55 to 60%. The  left ventricle has normal function. The left ventricle has no regional  wall motion abnormalities. There is mild left ventricular hypertrophy.  Left ventricular diastolic function  could not be evaluated.  2. Right ventricular systolic function is normal. The right ventricular  size is normal. There is normal pulmonary artery systolic pressure.  3. Well seated 23 mm Evolute Pro TAVR valve. Mild paravalvular leak. Mean  PG 14 mmhg only mildly higher than prior (9 mmHg). DVR of 0.44 suggests no  significant aortic stenosis.  4. Mild mitral leaflet thickening. Trivial mitral regurgitation.  5. Mild to moderate tricuspid regurgitation. Estimated PASP 32 mmHg.  6. Mild pulmonic regurgitation.  7. Compared to previous outpatient study on 02/15/2020, no major change  noted.   PCV MYOCARDIAL PERFUSION WITH LEXISCAN 05/15/2020 Nondiagnostic ECG stress with lexiscan infusion. Resting EKG demonstrated normal sinus rhythm with left bundle branch block (IVCD). Peak EKG revealed no significant ST-T change from baseline abnormality. Myocardial perfusion is normal. Overall LV systolic function is normal without regional wall motion abnormalities. Stress LV EF: 69%. No change from 04/25/2017. Low risk.   EKG   EKG 03/12/2020: Normal sinus rhythm at rate of 84 bpm, left atrial enlargement, left bundle branch block.  No further analysis. No significant change from EKG 02/28/2020  Assessment     ICD-10-CM   1. Exertional dyspnea  R06.00   2. Coronary artery disease involving native coronary artery of native heart without angina  pectoris  I25.10   3. Primary hypertension  II78Basic metabolic panel     Meds ordered this encounter  Medications  . amoxicillin (AMOXIL) 500 MG capsule    Sig: Take 1 capsule (500 mg total) by mouth as needed. Take medication only before dental procedures.    Dispense:  4 capsule    Refill:  4   Medications Discontinued During This Encounter  Medication Reason  . Tiotropium Bromide Monohydrate (SPIRIVA RESPIMAT) 2.5 MCG/ACT AERS Error  . amoxicillin (AMOXIL) 500 MG capsule Reorder     Recommendations:   [name] [name] is a 87y.o. Caucasian female with history of benign positional vertigo, chronic dyspnea, chronic back pain, hypertension, mixed hyperlipidemia and  symptomatic aortic stenosis, CAD S/P PCI to LAD/D1  on 01/05/2019. Underwent elective TAVR on 01/26/2019, mild to moderate paravalvular leak.  Patient presents for 6 week follow up of dyspnea on exertion.  Since last visit patient completed nuclear stress test, which was low risk with normal myocardial perfusion.  Patient was previously hospitalized 05/05/2020 - 05/07/2020 with acute respiratory failure and hypokalemia, potassium has since normalized.  Reviewed and discussed with patient regarding results of echocardiogram and nuclear stress test, details above.  Echocardiogram is unchanged compared to previous in January 2022 and stress test was low risk.  No identifiable cardiac cause of dyspnea on exertion.  Notably patient's dyspnea is likely multifactorial including underlying lung pathology as it has improved with use of Spiriva.  Significant hiatal hernia may also be contributing to patient's dyspnea on exertion.  She has GI evaluation pending.  Patient is otherwise stable from a cardiovascular standpoint.   Will repeat BMP to continue to follow potassium levels as well as renal function.  I have also refilled amoxicillin for dental procedure prophylaxis.  Follow-up in 4 months, sooner if needed, for CAD and status post  TAVR.   Alethia Berthold, PA-C 06/28/2020, 2:39 PM Office: [phone]

## 2020-06-23 ENCOUNTER — Encounter: Payer: Self-pay | Admitting: Student

## 2020-06-23 ENCOUNTER — Ambulatory Visit: Payer: Federal, State, Local not specified - PPO | Admitting: Student

## 2020-06-23 ENCOUNTER — Other Ambulatory Visit: Payer: Self-pay

## 2020-06-23 VITALS — BP 113/71 | HR 82 | Temp 97.8°F | Resp 16 | Ht 60.0 in | Wt 117.0 lb

## 2020-06-23 DIAGNOSIS — I10 Essential (primary) hypertension: Secondary | ICD-10-CM

## 2020-06-23 DIAGNOSIS — R0609 Other forms of dyspnea: Secondary | ICD-10-CM

## 2020-06-23 DIAGNOSIS — R0600 Dyspnea, unspecified: Secondary | ICD-10-CM

## 2020-06-23 DIAGNOSIS — I2510 Atherosclerotic heart disease of native coronary artery without angina pectoris: Secondary | ICD-10-CM

## 2020-06-23 MED ORDER — AMOXICILLIN 500 MG PO CAPS: 500.0000 mg | ORAL_CAPSULE | ORAL | 4 refills | Status: DC | PRN

## 2020-07-21 ENCOUNTER — Ambulatory Visit: Payer: Federal, State, Local not specified - PPO | Admitting: Dietician

## 2020-08-03 ENCOUNTER — Other Ambulatory Visit: Payer: Self-pay | Admitting: Physician Assistant

## 2020-08-03 DIAGNOSIS — D508 Other iron deficiency anemias: Secondary | ICD-10-CM

## 2020-08-04 ENCOUNTER — Inpatient Hospital Stay: Payer: Federal, State, Local not specified - PPO | Attending: Physician Assistant | Admitting: Physician Assistant

## 2020-08-04 ENCOUNTER — Other Ambulatory Visit: Payer: Self-pay

## 2020-08-04 ENCOUNTER — Inpatient Hospital Stay: Payer: Federal, State, Local not specified - PPO

## 2020-08-04 VITALS — BP 117/85 | HR 81 | Temp 98.0°F | Resp 17 | Wt 116.9 lb

## 2020-08-04 DIAGNOSIS — N189 Chronic kidney disease, unspecified: Secondary | ICD-10-CM | POA: Diagnosis not present

## 2020-08-04 DIAGNOSIS — D509 Iron deficiency anemia, unspecified: Secondary | ICD-10-CM | POA: Diagnosis present

## 2020-08-04 DIAGNOSIS — D508 Other iron deficiency anemias: Secondary | ICD-10-CM

## 2020-08-04 DIAGNOSIS — D649 Anemia, unspecified: Secondary | ICD-10-CM | POA: Diagnosis not present

## 2020-08-04 DIAGNOSIS — K7460 Unspecified cirrhosis of liver: Secondary | ICD-10-CM | POA: Insufficient documentation

## 2020-08-04 LAB — CBC WITH DIFFERENTIAL (CANCER CENTER ONLY)
Abs Immature Granulocytes: 0.09 10*3/uL — ABNORMAL HIGH (ref 0.00–0.07)
Basophils Absolute: 0.1 10*3/uL (ref 0.0–0.1)
Basophils Relative: 1 %
Eosinophils Absolute: 0.1 10*3/uL (ref 0.0–0.5)
Eosinophils Relative: 1 %
HCT: 34.6 % — ABNORMAL LOW (ref 36.0–46.0)
Hemoglobin: 11.1 g/dL — ABNORMAL LOW (ref 12.0–15.0)
Immature Granulocytes: 1 %
Lymphocytes Relative: 24 %
Lymphs Abs: 2.2 10*3/uL (ref 0.7–4.0)
MCH: 28.8 pg (ref 26.0–34.0)
MCHC: 32.1 g/dL (ref 30.0–36.0)
MCV: 89.6 fL (ref 80.0–100.0)
Monocytes Absolute: 0.8 10*3/uL (ref 0.1–1.0)
Monocytes Relative: 9 %
Neutro Abs: 5.9 10*3/uL (ref 1.7–7.7)
Neutrophils Relative %: 64 %
Platelet Count: 261 10*3/uL (ref 150–400)
RBC: 3.86 MIL/uL — ABNORMAL LOW (ref 3.87–5.11)
RDW: 16.2 % — ABNORMAL HIGH (ref 11.5–15.5)
WBC Count: 9.1 10*3/uL (ref 4.0–10.5)
nRBC: 0 % (ref 0.0–0.2)

## 2020-08-04 LAB — RETIC PANEL
Immature Retic Fract: 11.9 % (ref 2.3–15.9)
RBC.: 3.78 MIL/uL — ABNORMAL LOW (ref 3.87–5.11)
Retic Count, Absolute: 54.4 10*3/uL (ref 19.0–186.0)
Retic Ct Pct: 1.4 % (ref 0.4–3.1)
Reticulocyte Hemoglobin: 34.6 pg (ref >27.9)

## 2020-08-04 LAB — IRON AND TIBC
Iron: 65 ug/dL (ref 41–142)
Saturation Ratios: 19 % — ABNORMAL LOW (ref 21–57)
TIBC: 339 ug/dL (ref 236–444)
UIBC: 274 ug/dL (ref 120–384)

## 2020-08-04 LAB — CMP (CANCER CENTER ONLY)
ALT: 45 U/L — ABNORMAL HIGH (ref 0–44)
AST: 56 U/L — ABNORMAL HIGH (ref 15–41)
Albumin: 3.4 g/dL — ABNORMAL LOW (ref 3.5–5.0)
Alkaline Phosphatase: 368 U/L — ABNORMAL HIGH (ref 38–126)
Anion gap: 10 (ref 5–15)
BUN: 21 mg/dL (ref 8–23)
CO2: 30 mmol/L (ref 22–32)
Calcium: 10.2 mg/dL (ref 8.9–10.3)
Chloride: 95 mmol/L — ABNORMAL LOW (ref 98–111)
Creatinine: 1.78 mg/dL — ABNORMAL HIGH (ref 0.44–1.00)
GFR, Estimated: 27 mL/min — ABNORMAL LOW (ref >60)
Glucose, Bld: 143 mg/dL — ABNORMAL HIGH (ref 70–99)
Potassium: 4.2 mmol/L (ref 3.5–5.1)
Sodium: 135 mmol/L (ref 135–145)
Total Bilirubin: 0.5 mg/dL (ref 0.3–1.2)
Total Protein: 7.4 g/dL (ref 6.5–8.1)

## 2020-08-04 LAB — FERRITIN: Ferritin: 72 ng/mL (ref 11–307)

## 2020-08-04 NOTE — Progress Notes (Signed)
[name] [name] Telephone:([phone]) [phone]   Fax:([phone]) [place] NOTE  Patient Care Team: Kelton Pillar, MD as PCP - General (Family Medicine) Adrian Prows, MD as PCP - Cardiology (Cardiology) Burnell Blanks, MD as PCP - Structural Heart (Cardiology)  Hematological/Oncological History 1) Labs from PCP, Dr. Kelton Pillar, [place] [facility] 09/21/2019: WBC 7.3, Hgb 12.3, MCV 91.2, Plt 244 03/27/2020: WBC 14.9 (H), Hgb 8.5 (L), MCV 91.0, Plt 339, Iron 35 (L), Saturation 7% (L), Transferrin 335, TIBC 469 (H) 04/11/2020: WBC 16.1 (H), Hgb 9.2 (L), MCV 86.0, Plt 379 05/10/2020: WBC 7.6, Hgb 8.9 (L), MCV 65.2 (L), Plt 361  2) 05/30/2020: Establish care with Dede Query PA-C. Recommended to start ferrous sulfate 325 mg once daily.   CHIEF COMPLAINTS:  Normocytic anemia.   HISTORY OF PRESENTING ILLNESS:  [name] [name] 87 y.o. female returns today for follow-up from normocytic anemia.  Patient is unaccompanied for this visit.  She reports that her energy levels have improved since starting oral iron supplementation.  She continues to complete all her ADLs on her own and is the primary caregiver for her husband.  Patient has good appetite without any noticeable weight loss.  She denies any nausea, vomiting or abdominal pain.  Bowel movements are regular without any diarrhea or constipation.  Patient denies any easy bruising or signs of bleeding.  She manages her lower extremity edema with Lasix as needed with relief. Patient denies any fevers, chills, chest pain, cough, skin changes or neuropathy.  She has no other complaints.  Rest of the 10 point ROS is below.  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    Hyperlipidemia    Hypertension    Hypothyroidism    Lipoma    left arm   Nasal polyps    Pneumonia    Pulmonary nodule    noted on pre TAVR CT   Severe aortic stenosis     SURGICAL HISTORY: Past Surgical History:  Procedure Laterality Date   CHOLECYSTECTOMY  02/27/04   CORONARY ATHERECTOMY N/A 01/05/2019   Procedure: CORONARY ATHERECTOMY;  Surgeon: Adrian Prows, MD;  Location: [place] [facility];  Service: Cardiovascular;  Laterality: N/A;   CORONARY BALLOON ANGIOPLASTY N/A 01/05/2019   Procedure: CORONARY BALLOON ANGIOPLASTY;  Surgeon: Adrian Prows, MD;  Location: [place] [facility];  Service: Cardiovascular;  Laterality: N/A;   CORONARY STENT INTERVENTION N/A 01/05/2019   Procedure: CORONARY STENT INTERVENTION;  Surgeon: Adrian Prows, MD;  Location: [place] [facility];  Service: Cardiovascular;  Laterality: N/A;   CORONARY STENT INTERVENTION  01/05/2019   INCONTINENCE SURGERY  09/12/09   NASAL SINUS SURGERY     several     ORIF ANKLE FRACTURE Right 09/11/2013   Procedure: OPEN REDUCTION INTERNAL FIXATION (ORIF) ANKLE FRACTURE;  Surgeon: Augustin Schooling, MD;  Location: [facility];  Service: Orthopedics;  Laterality: Right;   RIGHT/LEFT HEART CATH AND CORONARY ANGIOGRAPHY N/A 12/15/2018   Procedure: RIGHT/LEFT HEART CATH AND CORONARY ANGIOGRAPHY;  Surgeon: Einar Gip,  Ulice Dash, MD;  Location: [place] [facility];  Service: Cardiovascular;  Laterality: N/A;   ROTATOR CUFF REPAIR  09/27/2003   TRANSCATHETER AORTIC VALVE REPLACEMENT, TRANSFEMORAL  01/26/2019   TRANSCATHETER AORTIC VALVE REPLACEMENT, TRANSFEMORAL N/A 01/26/2019   Procedure: TRANSCATHETER AORTIC VALVE REPLACEMENT, TRANSFEMORAL;  Surgeon: Burnell Blanks, MD;  Location: [place];  Service: Open Heart Surgery;  Laterality: N/A;   TRIGGER FINGER RELEASE  10/29/06    SOCIAL HISTORY: Social History    Socioeconomic History   Marital status: Married    Spouse name: Not on file   Number of children: 3   Years of education: Not on file   Highest education level: Not on file  Occupational History   Not on file  Tobacco Use   Smoking status: Never   Smokeless tobacco: Never  Vaping Use   Vaping Use: Never used  Substance and Sexual Activity   Alcohol use: No   Drug use: No   Sexual activity: Not on file  Other Topics Concern   Not on file  Social History Narrative   Not on file   Social Determinants of Health   Financial Resource Strain: Not on file  Food Insecurity: Not on file  Transportation Needs: Not on file  Physical Activity: Not on file  Stress: Not on file  Social Connections: Not on file  Intimate Partner Violence: Not on file    FAMILY HISTORY: Family History  Problem Relation Age of Onset   Tuberculosis Mother    Other Father        Bronchiectasis   Cancer Maternal Grandmother        ovarian   Sarcoidosis Sister    Asthma Maternal Grandfather    Heart disease Brother    Heart attack Brother    Breast cancer Daughter    Brain cancer Daughter     ALLERGIES:  is allergic to aspirin, bee venom, ceclor [cefaclor], pneumococcal vac polyvalent, statins, penicillins, and sulfa antibiotics.  MEDICATIONS:  Current Outpatient Medications  Medication Sig Dispense Refill   albuterol (VENTOLIN HFA) 108 (90 Base) MCG/ACT inhaler Inhale 2 puffs into the lungs every 6 (six) hours as needed for shortness of breath (use as needed or before activity for shortness of breath or whezzing). 8 g 6   amoxicillin (AMOXIL) 500 MG capsule Take 1 capsule (500 mg total) by mouth as needed. Take medication only before dental procedures. 4 capsule 4   atorvastatin (LIPITOR) 10 MG tablet Take 1 tablet (10 mg total) by mouth daily. 90 tablet 3   calcium-vitamin D (OSCAL WITH D) 250-125 MG-UNIT tablet Take 1 tablet by mouth daily.     clopidogrel (PLAVIX) 75 MG tablet TAKE 1  TABLET BY MOUTH EVERY DAY 90 tablet 1   ferrous sulfate 325 (65 FE) MG EC tablet Take 1 tablet (325 mg total) by mouth daily with breakfast. 30 tablet 3   fexofenadine (ALLEGRA) 180 MG tablet Take 180 mg by mouth daily.     furosemide (LASIX) 20 MG tablet PLEASE SEE ATTACHED FOR DETAILED DIRECTIONS 180 tablet 0   levothyroxine (SYNTHROID) 50 MCG tablet Take 50 mcg by mouth daily before breakfast.     meclizine (ANTIVERT) 25 MG tablet Take 25 mg by mouth 3 (three) times daily as needed for dizziness.     mometasone (NASONEX) 50 MCG/ACT nasal spray Place 2 sprays into the nose daily.     montelukast (SINGULAIR) 10 MG tablet Take 1 tablet (10 mg total) by mouth daily. 30 tablet  6   Multiple Vitamin (MULTIVITAMIN WITH MINERALS) TABS tablet Take 1 tablet by mouth daily.     Peak Flow Meter DEVI 1 puff by Does not apply route in the morning and at bedtime. 1 each 0   Polyethyl Glycol-Propyl Glycol (SYSTANE) 0.4-0.3 % SOLN Place 1 drop into both eyes 3 (three) times daily as needed (dry/irritated eyes.).     potassium chloride (KLOR-CON) 10 MEQ tablet Take 1 tablet (10 mEq total) by mouth daily. 90 tablet 3   propranolol (INDERAL) 40 MG tablet Take 20 mg by mouth 3 (three) times daily.     Tiotropium Bromide Monohydrate (SPIRIVA RESPIMAT) 2.5 MCG/ACT AERS Inhale 2 puffs into the lungs daily. 4 g 0   No current facility-administered medications for this visit.    REVIEW OF SYSTEMS:   Constitutional: ( - ) fevers, ( - )  chills , ( - ) night sweats Eyes: ( - ) blurriness of vision, ( - ) double vision, ( - ) watery eyes Ears, nose, mouth, throat, and face: ( - ) mucositis, ( - ) sore throat Respiratory: ( - ) cough, ( + ) dyspnea, ( - ) wheezes Cardiovascular: ( - ) palpitation, ( - ) chest discomfort, ( - ) lower extremity swelling Gastrointestinal:  ( - ) nausea, ( - ) heartburn, ( - ) change in bowel habits Skin: ( - ) abnormal skin rashes Lymphatics: ( - ) new lymphadenopathy, ( - ) easy  bruising Neurological: ( - ) numbness, ( - ) tingling, ( - ) new weaknesses Behavioral/Psych: ( - ) mood change, ( - ) new changes  All other systems were reviewed with the patient and are negative.  PHYSICAL EXAMINATION: ECOG PERFORMANCE STATUS: 1 - Symptomatic but completely ambulatory  Vitals:   08/04/20 1402  BP: 117/85  Pulse: 81  Resp: 17  Temp: 98 F (36.7 C)  SpO2: 98%    Filed Weights   08/04/20 1402  Weight: 116 lb 14.4 oz (53 kg)     GENERAL: well appearing female in NAD  SKIN: skin color, texture, turgor are normal, no rashes or significant lesions EYES: conjunctiva are pink and non-injected, sclera clear OROPHARYNX: no exudate, no erythema; lips, buccal mucosa, and tongue normal  NECK: supple, non-tender LYMPH:  no palpable lymphadenopathy in the cervical or supraclavicular lymph nodes.  LUNGS: clear to auscultation and percussion with normal breathing effort HEART: regular rate & rhythm and no murmurs and no lower extremity edema ABDOMEN: soft, non-tender, non-distended, normal bowel sounds Musculoskeletal: no cyanosis of digits and no clubbing  PSYCH: alert & oriented x 3, fluent speech NEURO: no focal motor/sensory deficits  LABORATORY DATA:  I have reviewed the data as listed CBC Latest Ref Rng & Units 08/04/2020 05/30/2020 05/04/2020  WBC 4.0 - 10.5 K/uL 9.1 7.3 9.9  Hemoglobin 12.0 - 15.0 g/dL 11.1(L) 9.5(L) 9.1(L)  Hematocrit 36.0 - 46.0 % 34.6(L) 30.9(L) 29.2(L)  Platelets 150 - 400 K/uL 261 254 361    CMP Latest Ref Rng & Units 08/04/2020 05/30/2020 05/06/2020  Glucose 70 - 99 mg/dL 143(H) 101(H) 95  BUN 8 - 23 mg/dL '21 19 16  '$ Creatinine 0.44 - 1.00 mg/dL 1.78(H) 1.60(H) 1.14(H)  Sodium 135 - 145 mmol/L 135 140 135  Potassium 3.5 - 5.1 mmol/L 4.2 4.3 3.8  Chloride 98 - 111 mmol/L 95(L) 99 104  CO2 22 - 32 mmol/L '30 31 22  '$ Calcium 8.9 - 10.3 mg/dL 10.2 9.5 7.8(L)  Total Protein 6.5 - 8.1  g/dL 7.4 7.2 5.0(L)  Total Bilirubin 0.3 - 1.2 mg/dL 0.5  0.4 0.2(L)  Alkaline Phos 38 - 126 U/L 368(H) 287(H) 180(H)  AST 15 - 41 U/L 56(H) 75(H) 41  ALT 0 - 44 U/L 45(H) 48(H) 27    RADIOGRAPHIC STUDIES: I have personally reviewed the radiological images as listed and agreed with the findings in the report. No results found.  ASSESSMENT & PLAN CHANCE [name] [name] is a 87 y.o. female presenting to the clinic for normocytic anemia.   # Normocytic anemia: --Likely multifactorial secondary to iron deficiency and CKD.  --Currently on ferrous sulfate 325 mg once daily --Patient denies any bleeding at this time. She is scheduled to follow up with Dr. Wilford Corner [facility]) next month 09/04/2020.  --Labs today show improvement of anemia with Hgb is 11.1. Ferritin is 72, Iron 65, Iron saturation at 19%. We will fax results to Dr. Kathline Magic office prior to visit.  --Recommend to continue to take ferrous sulfate once daily as prescribed.  --RTC in 3 months with repeat labs.   # CKD:  --Labs today show creatinine levels have continue to decline, now 1.78 --Patient is not under the care of a nephrologist so I will fax lab results to PCP, Dr. Ethelle Lyon, to review and send referral to nephrology  #Cirrhosis: --Liver enzymes are stable with AST 56 and ALT 45. --Monitor for now.  Orders Placed This Encounter  Procedures   CBC with Differential ([place] Only)    Standing Status:   Future    Standing Expiration Date:   08/04/2021   CMP ([place] only)    Standing Status:   Future    Standing Expiration Date:   08/04/2021   Ferritin    Standing Status:   Future    Standing Expiration Date:   08/04/2021   Iron and TIBC    Standing Status:   Future    Standing Expiration Date:   08/04/2021     All questions were answered. The patient knows to call the clinic with any problems, questions or concerns.  I have spent a total of 30 minutes minutes of face-to-face and non-face-to-face time, preparing to see the patient, obtaining and/or  reviewing separately obtained history, performing a medically appropriate examination, counseling and educating the patient, ordering tests, communicating with other health care professionals, documenting clinical information in the electronic health record, and care coordination.    Dede Query, PA-C Department of Hematology/Oncology [place] at [facility] Phone: [phone]

## 2020-08-09 ENCOUNTER — Telehealth: Payer: Self-pay

## 2020-08-09 ENCOUNTER — Other Ambulatory Visit: Payer: Self-pay | Admitting: Otolaryngology

## 2020-08-09 NOTE — Telephone Encounter (Signed)
Per Dede Query, PA I have faxed over Ms. Preece most recent lab work from 08/04/20 to Dr. Michail Sermon with [facility], with receipt of confirmation.

## 2020-08-14 ENCOUNTER — Encounter: Payer: Self-pay | Admitting: Dietician

## 2020-08-14 ENCOUNTER — Other Ambulatory Visit: Payer: Self-pay

## 2020-08-14 ENCOUNTER — Encounter: Payer: Federal, State, Local not specified - PPO | Attending: Student | Admitting: Dietician

## 2020-08-14 DIAGNOSIS — N184 Chronic kidney disease, stage 4 (severe): Secondary | ICD-10-CM | POA: Diagnosis present

## 2020-08-14 DIAGNOSIS — N1832 Chronic kidney disease, stage 3b: Secondary | ICD-10-CM | POA: Diagnosis present

## 2020-08-14 NOTE — Patient Instructions (Addendum)
Consider biotin supplement to see if this helps your hair loss.  Generally, it only helps when there is a deficiency.  Check your multi vitamin as it may be included.  Consider supplementing with the boost daily to see if this makes any difference overall in your hair loss.  When choosing a multiple vitamin, consider an option that has Methyl folate rather than folic acid (due to your probability of MTHFR gene mutation).  Continue your balanced diet. Continue to limit added salt. Continue to stay hydrated  If you begin losing weight again, begin to find ways to increase your calories such as adding olive oil, regular mayo, regular sour cream, whole milk yogurt, fruit smoothies, cranberry juice, lemonade to your day.

## 2020-08-14 NOTE — Progress Notes (Signed)
Medical Nutrition Therapy  Appointment Start time:  [id number]  Appointment End time:  [id number]  Patient is here today alone. She is the primary care giver for her husband who is 85 yo and now has a broken wrist. She is complaining that her hair is falling out. Anemia has improved. She takes a MVI and has questions regarding this.   She is planning on restarting turmeric daily. She is taking Lasix and states that she now takes potassium with this per MD order. "Never hungry and make myself eat" Weight now stable and is currently not a concern to the patient. Calorie and protein count of 24 hour recall meets estimated (1600 calories, 45 grams protein).  Primary concerns today: Patient is here today with her daughter Amy who is a Designer, jewellery.   They would like to learn max calories in less volume to improve her weight. She is drinking 1 Boost daily. Patient states that she does not have an appetite and has not had a sense of smell for years.  Referral diagnosis: abnormal weight loss Preferred learning style: no preference indicated Learning readiness: ready, change in progress   NUTRITION ASSESSMENT  Nutrition focussed nutrition examination preformed: Patient with increased hair loss noted and very easily pluckable hair. Decreased tissue noted at temples. Adequate muscle at collar bone region. Fair scapular muscle. Brittle nails with increased ridging the length of the nails.  Horizontal bumps on thumbs only. Walks well, fair balance. No noted edema.  Anthropometrics  118 lbs 08/14/2020 119 lbs 06/06/2020 135 lbs 2019/20 Lost weight with increased pulmonary and cardiac issues  Clinical Medical Hx: Patients daughter reports patient with hiatal hernia that has pushed Lakely's stomach into her chest cavity, decreased lung capacity, fatty liver, CHF due to mitral valve issues now requiring lasix.  She has had worsening kidney function (now CKD stage 3b) and anemia.  Constipation due to  iron supplement.  Vitamin D at max range.  She is on a Calcium/D supplement and MVI but has stopped the vitamin D supplement.  Expected MTHFR gene mutation due to daughter's known positive. When she eats too much she has increased shortness of breath.  Medications: includes Lasix, potassium Labs: GFR 31, AST 75, ALT 48 05/30/2020, Hgb 9.6% 04/17/2020 Notable Signs/Symptoms: appears well nourished, independent  Lifestyle & Dietary Hx Patient lives with her husband.  She does cooking from scratch.  Her husband has started having problems with aspiration (to be further worked up by SLP) and he likes 3 meals per day with meat, vegetables, starch.   She uses a small amount of sea salt.  Estimated daily fluid intake: 32 oz Supplements: MIV, calcium/D   24-Hr Dietary Recall Enjoys snacking on low sodium nuts, peanut butter, fruit Breakfast:  OJ and protein bar today Lunch:  spinach and cheese quiche with tomatoes, baked vegetarian beans, limas and corn, chocolate meringue pie Dinner:  Banana sandwich with mayo, waldorf salad with walnuts Beverages: (32 oz fluid daily) water, decaf tea with stevia,4 oz OJ, occasional smoothie (juice, fruit, activia yogurt)  Estimated Energy Needs Calories: 1600 Protein: 45g  NUTRITION DIAGNOSIS  NB-1.1 Food and nutrition-related knowledge deficit As related to multiple diagnosis (CKD, CHF, increased LFT's,  poor appetite).  As evidenced by diet hx and patient report.  NUTRITION INTERVENTION  Nutrition education (E-1) on the following topics:  Vitamin deficiency and impact on hair loss Overall protein intake and impact on kidneys and hair Sodium and eating out Mindfulness of intake to continue to maintain weight  Handouts Provided Include (initial visit) Underweight nutrition therapy Label reading Low sodium nutrition therapy from AND Seasoning tips  Learning Style & Readiness for Change Teaching method utilized: Visual & Auditory  Demonstrated  degree of understanding via: Teach Back  Barriers to learning/adherence to lifestyle change: none  Plan: Consider biotin supplement to see if this helps your hair loss.  Generally, it only helps when there is a deficiency.  Check your multi vitamin as it may be included.  Consider supplementing with the boost daily to see if this makes any difference overall in your hair loss.  When choosing a multiple vitamin, consider an option that has Methyl folate rather than folic acid (due to your probability of MTHFR gene mutation).  Continue your balanced diet. Continue to limit added salt. Continue to stay hydrated  If you begin losing weight again, begin to find ways to increase your calories such as adding olive oil, regular mayo, regular sour cream, whole milk yogurt, fruit smoothies, cranberry juice, lemonade to your day.  MONITORING & EVALUATION Dietary intake, weekly physical activity, and label reading in 3 months.  Next Steps  Patient is to call for questions as needed.

## 2020-08-16 ENCOUNTER — Ambulatory Visit: Payer: Federal, State, Local not specified - PPO | Admitting: Cardiology

## 2020-08-16 ENCOUNTER — Other Ambulatory Visit: Payer: Self-pay

## 2020-08-16 ENCOUNTER — Telehealth: Payer: Self-pay | Admitting: Cardiology

## 2020-08-16 MED ORDER — ROSUVASTATIN CALCIUM 10 MG PO TABS: 10.0000 mg | ORAL_TABLET | Freq: Every day | ORAL | 0 refills | Status: DC

## 2020-08-16 NOTE — Telephone Encounter (Signed)
Done

## 2020-08-16 NOTE — Telephone Encounter (Signed)
PT is calling with some questions regarding a med. Call back number [phone]

## 2020-08-16 NOTE — Telephone Encounter (Signed)
Pt calling and saying her hair is falling out and believes it is the Atorvastatin and wanted to know if she could switch to crestor

## 2020-08-16 NOTE — Telephone Encounter (Signed)
Crestor 10 mg daily

## 2020-08-17 LAB — BASIC METABOLIC PANEL
BUN/Creatinine Ratio: 18 (ref 12–28)
BUN: 25 mg/dL (ref 8–27)
CO2: 27 mmol/L (ref 20–29)
Calcium: 9.8 mg/dL (ref 8.7–10.3)
Chloride: 97 mmol/L (ref 96–106)
Creatinine, Ser: 1.42 mg/dL — ABNORMAL HIGH (ref 0.57–1.00)
Glucose: 81 mg/dL (ref 65–99)
Potassium: 4.5 mmol/L (ref 3.5–5.2)
Sodium: 138 mmol/L (ref 134–144)
eGFR: 36 mL/min/{1.73_m2} — ABNORMAL LOW (ref >59)

## 2020-08-23 ENCOUNTER — Telehealth: Payer: Self-pay | Admitting: Cardiology

## 2020-08-23 ENCOUNTER — Telehealth: Payer: Self-pay | Admitting: Pulmonary Disease

## 2020-08-23 NOTE — Telephone Encounter (Signed)
Pt aware.

## 2020-08-23 NOTE — Telephone Encounter (Signed)
Pt called stating she is having surgery on 8/19 w/Dr. Wilburn Cornelia at [facility] says she was told to stop blood thinners, but also would like to know if there is anything else Dr. Einar Gip or CMAs would advise (any meds she may need to stop, any other recommendations on our end, etc.

## 2020-08-23 NOTE — Telephone Encounter (Signed)
Pt called stating she is having surgery on 8/19 w/Dr. Wilburn Cornelia at [place] says she was told to stop blood thinners, but also would like to know if there is anything else Dr. Einar Gip or [facility]As would advise (any meds she may need to stop, any other recommendations on our end, etc.). [facility]

## 2020-08-23 NOTE — Telephone Encounter (Signed)
error 

## 2020-08-23 NOTE — Telephone Encounter (Signed)
NOthing specific. She can stop Plavix and restart post procedure same day or when her surgeon feels safe, but to make sure they give antibiotics prior to surgery (prosthetic aortic valve). JG

## 2020-08-31 NOTE — Progress Notes (Signed)
Surgical Instructions    Your procedure is scheduled on 09/08/20.  Report to [facility] Main Entrance "A" at 06:50 A.M., then check in with the Admitting office.  Call this number if you have problems the morning of surgery:  [phone]   If you have any questions prior to your surgery date call [phone]: Open Monday-Friday 8am-4pm    Remember:  Do not eat or drink after midnight the night before your surgery      Take these medicines the morning of surgery with A SIP OF WATER  albuterol (VENTOLIN HFA) inhaler if needed (bring with you the day of surgery) diphenhydrAMINE (BENADRYL) if needed fexofenadine (ALLEGRA) if needed levothyroxine (SYNTHROID)  meclizine (ANTIVERT) if needed mometasone (NASONEX) Systane eye drops predniSONE (STERAPRED UNI-PAK 21 TAB)  propranolol (INDERAL) rosuvastatin (CRESTOR)  SALINE NASAL SPRAY Tiotropium Bromide Monohydrate (SPIRIVA RESPIMAT) (bring with you the day of surgery)   As of today, STOP taking any Aspirin (unless otherwise instructed by your surgeon) Aleve, Naproxen, Ibuprofen, Motrin, Advil, Goody's, BC's, all herbal medications, fish oil, and all vitamins.   Please follow your surgeons instructions on when to stop taking clopidogrel (PLAVIX).          Do not wear jewelry or makeup Do not wear lotions, powders, perfumes/colognes, or deodorant. Do not shave 48 hours prior to surgery.   Do not bring valuables to the hospital.  DO Not wear nail polish, gel polish, artificial nails, or any other type of covering on natural nails  including finger and toenails. If patients have artificial nails, gel coating, etc. that need to be removed by a nail salon please have this removed prior to surgery or surgery may need to be canceled/delayed if the surgeon/ anesthesia feels like the patient is unable to be adequately monitored.             [facility] is not responsible for any belongings or valuables.  Do NOT Smoke (Tobacco/Vaping) or  drink Alcohol 24 hours prior to your procedure If you use a CPAP at night, you may bring all equipment for your overnight stay.   Contacts, glasses, dentures or bridgework may not be worn into surgery, please bring cases for these belongings   For patients admitted to the hospital, discharge time will be determined by your treatment team.   Patients discharged the day of surgery will not be allowed to drive home, and someone needs to stay with them for 24 hours.  ONLY 1 SUPPORT PERSON MAY BE PRESENT WHILE YOU ARE IN SURGERY. IF YOU ARE TO BE ADMITTED ONCE YOU ARE IN YOUR ROOM YOU WILL BE ALLOWED TWO (2) VISITORS.  Minor children may have two parents present. Special consideration for safety and communication needs will be reviewed on a case by case basis.  Special instructions:    Oral Hygiene is also important to reduce your risk of infection.  Remember - BRUSH YOUR TEETH THE MORNING OF SURGERY WITH YOUR REGULAR TOOTHPASTE   - Preparing For Surgery  Before surgery, you can play an important role. Because skin is not sterile, your skin needs to be as free of germs as possible. You can reduce the number of germs on your skin by washing with CHG (chlorahexidine gluconate) Soap before surgery.  CHG is an antiseptic cleaner which kills germs and bonds with the skin to continue killing germs even after washing.     Please do not use if you have an allergy to CHG or antibacterial soaps. If your  skin becomes reddened/irritated stop using the CHG.  Do not shave (including legs and underarms) for at least 48 hours prior to first CHG shower. It is OK to shave your face.  Please follow these instructions carefully.     Shower the NIGHT BEFORE SURGERY and the MORNING OF SURGERY with CHG Soap.   If you chose to wash your hair, wash your hair first as usual with your normal shampoo. After you shampoo, rinse your hair and body thoroughly to remove the shampoo.  Then [employer] and genitals  (private parts) with your normal soap and rinse thoroughly to remove soap.  After that Use CHG Soap as you would any other liquid soap. You can apply CHG directly to the skin and wash gently with a scrungie or a clean washcloth.   Apply the CHG Soap to your body ONLY FROM THE NECK DOWN.  Do not use on open wounds or open sores. Avoid contact with your eyes, ears, mouth and genitals (private parts). Wash Face and genitals (private parts)  with your normal soap.   Wash thoroughly, paying special attention to the area where your surgery will be performed.  Thoroughly rinse your body with warm water from the neck down.  DO NOT shower/wash with your normal soap after using and rinsing off the CHG Soap.  Pat yourself dry with a CLEAN TOWEL.  Wear CLEAN PAJAMAS to bed the night before surgery  Place CLEAN SHEETS on your bed the night before your surgery  DO NOT SLEEP WITH PETS.   Day of Surgery: Take a shower with CHG soap. Wear Clean/Comfortable clothing the morning of surgery Do not apply any deodorants/lotions.   Remember to brush your teeth WITH YOUR REGULAR TOOTHPASTE.   Please read over the following fact sheets that you were given.

## 2020-09-01 ENCOUNTER — Other Ambulatory Visit: Payer: Self-pay

## 2020-09-01 ENCOUNTER — Encounter (HOSPITAL_COMMUNITY): Payer: Self-pay

## 2020-09-01 ENCOUNTER — Encounter (HOSPITAL_COMMUNITY)
Admission: RE | Admit: 2020-09-01 | Discharge: 2020-09-01 | Disposition: A | Discharge status: Home / Self Care | Payer: Federal, State, Local not specified - PPO | Source: Ambulatory Visit | Attending: Otolaryngology | Admitting: Otolaryngology

## 2020-09-01 ENCOUNTER — Other Ambulatory Visit: Payer: Self-pay | Admitting: Student

## 2020-09-01 DIAGNOSIS — N289 Disorder of kidney and ureter, unspecified: Secondary | ICD-10-CM

## 2020-09-01 DIAGNOSIS — Z01812 Encounter for preprocedural laboratory examination: Secondary | ICD-10-CM | POA: Diagnosis present

## 2020-09-01 DIAGNOSIS — I10 Essential (primary) hypertension: Secondary | ICD-10-CM

## 2020-09-01 HISTORY — DX: Unspecified asthma, uncomplicated: J45.909

## 2020-09-01 HISTORY — DX: Emphysema, unspecified: J43.9

## 2020-09-01 HISTORY — DX: Unspecified asthma, uncomplicated: J33.9

## 2020-09-01 LAB — CBC
HCT: 37.3 % (ref 36.0–46.0)
Hemoglobin: 12 g/dL (ref 12.0–15.0)
MCH: 30.3 pg (ref 26.0–34.0)
MCHC: 32.2 g/dL (ref 30.0–36.0)
MCV: 94.2 fL (ref 80.0–100.0)
Platelets: 288 10*3/uL (ref 150–400)
RBC: 3.96 MIL/uL (ref 3.87–5.11)
RDW: 15.7 % — ABNORMAL HIGH (ref 11.5–15.5)
WBC: 13.9 10*3/uL — ABNORMAL HIGH (ref 4.0–10.5)
nRBC: 0 % (ref 0.0–0.2)

## 2020-09-01 LAB — COMPREHENSIVE METABOLIC PANEL
ALT: 125 U/L — ABNORMAL HIGH (ref 0–44)
AST: 151 U/L — ABNORMAL HIGH (ref 15–41)
Albumin: 3.8 g/dL (ref 3.5–5.0)
Alkaline Phosphatase: 338 U/L — ABNORMAL HIGH (ref 38–126)
Anion gap: 10 (ref 5–15)
BUN: 56 mg/dL — ABNORMAL HIGH (ref 8–23)
CO2: 28 mmol/L (ref 22–32)
Calcium: 9.3 mg/dL (ref 8.9–10.3)
Chloride: 97 mmol/L — ABNORMAL LOW (ref 98–111)
Creatinine, Ser: 1.76 mg/dL — ABNORMAL HIGH (ref 0.44–1.00)
GFR, Estimated: 28 mL/min — ABNORMAL LOW (ref >60)
Glucose, Bld: 150 mg/dL — ABNORMAL HIGH (ref 70–99)
Potassium: 4.2 mmol/L (ref 3.5–5.1)
Sodium: 135 mmol/L (ref 135–145)
Total Bilirubin: 0.7 mg/dL (ref 0.3–1.2)
Total Protein: 7.5 g/dL (ref 6.5–8.1)

## 2020-09-01 MED ORDER — PROPRANOLOL HCL 20 MG PO TABS: 20.0000 mg | ORAL_TABLET | Freq: Three times a day (TID) | ORAL | 3 refills | Status: DC

## 2020-09-01 NOTE — Progress Notes (Addendum)
Anesthesia PAT Evaluation:  Case: [id number] Date/Time: 09/08/20 0838   Procedure: Bilateral Revision of Endoscopic Sinus Surgery with Fusion and Intranasal Polypectomy (Bilateral)   Anesthesia type: General   Pre-op diagnosis: J32.0; J33.9; J32.4   Location: [facility] ROOM 09 / [facility]   Surgeons: Jerrell Belfast, MD       DISCUSSION: Patient is an 87 year old female scheduled for the above procedure.  Patient evaluated with daughter Amy, a pediatric NP, present [phone]). Patient requests that Amy be called first, but may also contact her at [phone]. Mrs. Viramontes still lives at home with her husband who has Alzheimer's.  Provider wore N95 and universal face mask.  Patient and her daughter Amy both wore a K95 type facemasks.  History includes never smoker, severe aortic stenosis (s/p TAVR 01/26/19, mild paravalvular leak 05/05/20 echo), CAD (s/p orbital atherectomy/DES proximal LAD and orbital atherectomy/PTCA D1 with residual 15-20% stenosis 65/78/4696; complicated by right arm and groing hematoma but did not require transfusion or surgical intervention), HTN, HLD, CKD, hiatal hernia (paraesophageal hernia, much of the stomach in the chest 01/19/20 CT), GERD, liver cirrhosis (with abnormal transaminases), hypothyroidism, pulmonary nodules (stable small RML, LLL nodules 01/19/20), dyspnea, emphysema (related to "wood smoke" exposure), Samter's Triad (asthma, nasal polyps, ASA allergy), sinus surgery (x4).    Anaphylactic reaction is listed for ASA, bee venom, Ceclor, although she notes that she has safely taken Amoxicillin (02/29/20, 09/28/19), and Augmentin (08/12/20). Due to cephalosporin allergy, surgeon antibiotic to OR is Vancomycin. Her daughter has requested that a pharmacist contact her to discuss possible antibiotic choices--and perhaps amending the allergy list to reflect patient can safely take penicillin class antibiotics. She is would also like their input on best choices for post-operative  pain, nausea medications given her mother's renal and liver disease. Also likely has one MTHFR gene variant (based on her daughters test results). I did speak with a [facility] pharmacist on 09/01/20, and asked that someone follow-up with Amy prior to surgery to discuss her concerns. She also inquired if her mother may need an allergist consult to clarify her antibiotic allergies.     - Admission 05/04/20-05/07/20 for acute respiratory failure with hypoxia, requiring 1-2L O2/[place] with ambulation.  Negative COVID test.  D-dimer elevated but VQ scan was not worrisome for PE.  There was concern that her dyspnea could be an angina equivalent, so outpatient cardiology work-up planned, although noted that pulmonary or sinus issues could be contributing as well as her large hiatal hernia. Out-patient follow-up planned with pulmonology, ENT, GI planned. Consider referral to general surgery for hiatal hernia if pulmonary and cardiac etiologies for dyspnea ruled out.   She has preoperative pulmonology clearance with Geraldo Pitter, NP on 09/05/20 at 3:00 PM. Daughter is particularly interested in her anticipated pulmonology risk, and if there is a need to maximize her therapy. Patient currently feels respiratory status is stable on Spiriva. Stable DOE. She does not require home O2 and has not had to use her rescue inhaler. Quantiferon-TB Gold negative 05/19/20. Patient says she is due for follow-up chest CT ~ 12/2020.    - Last cardiology visit with Lawerance Cruel, PA-C on 06/23/20 for 6 week follow-up exertional dyspnea with reassuring recent cardiac testing. Low risk stress test 05/15/2020.  05/05/2020 Limited echo showed LVEF 55 to 60%, normal wall motion, normal RV systolic function, normal PASP, well-seated TAVR valve with mild paravalvular leak, trivial MR, mild to moderate TR, mild PR.  She is also seen pulmonology  and noted some improvement on Spiriva.  She was also being referred to a general surgeon  for large paraesophageal hiatal hernia which could also be contributing factor to her dyspnea.  Patient was felt stable from a cardiovascular standpoint.  Per Dr. Einar Gip on 08/23/20 (in reference to patient letting him know about plans for sinus surgery), "NOthing specific. She can stop Plavix and restart post procedure same day or when her surgeon feels safe, but to make sure they give antibiotics prior to surgery (prosthetic aortic valve)." Last Plavix 09/01/20.    - Last hematology visit 08/04/20 with Dede Query, PA-C for normocytic anemia, felt likely multifactorial secondary to iron deficiency and CKD.  Labs stable on oral iron.  Consideration of nephrology referral due to further decline in creatinine levels. (She routed Cr 1.78 to PCP Dr. Laurann Montana for review, nephrology referral.) Patient has not yet seen a nephrologist. Her daughter is interested in getting her established, but says insurance may require progression to CKD stage IV first. They were advised to continue discussions with Dr. Laurann Montana. They will also reach out to cardiology to inquire about Lasix dosing.  It sounds like she had been taking Lasix daily as needed, but was having too many days where she would end up volume overloaded. Her weights have been stable, so she may consider asking about an every other day regimen or other suggestions given rise in BUN and Creatinine.    - Patient's cirrhosis with abnormal transaminases are currently followed by GI Dr. Michail Sermon.  She notes a course of steroids earlier this year, so thinks there may be an autoimmune component. March records did not specify the suspected cirrhosis etiology that I saw. She recently had been taking more Motrin for headaches, but just stopped this due to upcoming surgery. She had also been on Crestor, but had developed a rash and was concerned it may be contributing. With her advanced age and reportedly with some discussion from one of her providers, she recently stopped  it. I called and reviewed her elevated LFTs and WBC with 09/01/20 labs. Mild leukocytosis may be related to her recent steroid taper (for her rash), or possibly to the rash itself. She denied fever, chills, or acute sick symptoms. She has follow-up with Dr. Michail Sermon on 09/04/20. I have faxed the labs over to his office and discussed with anesthesiologist Renold Don, MD. Given AST and ALT have > doubled in the past 3 months, we would want GI input, and labs repeated if plan was to proceed with surgery as scheduled.  Her daughter Amy is concerned about possible progression of her mother's liver disease. Mrs. Andy has noticed itching and loss of hair over the past six months. She is seeing a dietician and is trying Biotin and trying to improve her caloric intake. Amy is wanting her mother to see a liver specialist. Discussed that timing of that would be up to them and Dr. Michail Sermon. Amy plans to follow-up with me after her mother's appointment on 09/04/20.    As above, I plan to follow-up with Mrs. Miltner and/or her daughter Amy after her visits with GI and pulmonology. She wants to discuss risk assessment further, as she says she was told up to 30% morbidity or mortality if she pursued paraesophageal hiatal hernia repair due to potential for chest tubes, etc. (I do not currently have the [facility] general surgeon's note.) Hopefully a pharmacist will have touched base with them by then. Case is posted for ambulatory surgery, although their preference  is overnight stay. They can further discuss with Dr. Wilburn Cornelia, but discussed that Mrs. Blea would also have have to meet the anesthesiologists criteria for discharge while in PACU. Daughter Amy wants to make sure staff check an ambulatory pulse ox before discharge.   ADDENDUM 09/04/20 2:03 PM: Patient and daughter Amy called me with update. Patient seen by Dr. Michail Sermon this morning. Reportedly, AST and/or ALT have been as high as 500's, so does not feel there is anything  specific to do preoperatively from his standpoint, other than repeat her HFP prior to surgery. She previously responded to steroids, so thought to likely have inflammatory or autoimmune etiology. He does not think she is at a point yet to require referral to a Liver Specialist. He plans to see her again in December 2022. They requested 09/07/20 AM repeat labs draw be done at our [facility] PAT lab clinic. She is going to follow-up with Dr. Laurann Montana on 09/05/20 at 11:30 AM for anemia/Creatinine and also inquire about her Ceclor allergy documentation. She is off steroids for her rash (last dose 09/02/20) and has stopped Singulair and Allegra for now given kidney and renal issues. They may ask Dr. Laurann Montana about a steroid or Benadryl cream if her rash/itching persists. She will also see pulmonology tomorrow. I will plan to talk with them afterwards. Will request GI records.    ADDENDUM 09/05/20 6:18 PM:  I spoke with patient and her daughter Amy this afternoon. She was evaluated by Dr. Kelton Pillar this morning and have touched base with Lawerance Cruel, PA-C with cardiology. Lasix and KCl have been changed to as needed (to take if > 3 lb weight gain/day or > 5 lb/week or new edema/SOB). Crestor has been added as an allergy due to rash which is being treated with Benadryl and/or hydrocortisone cream. Reportedly PCN removed for allergy list. Amy still has not heard from a pharmacist and wants to know best antibiotic choice and discharge pain medication choices. I left a message with Angela Nevin at Dr. Victorio Palm office asking if he can follow-up with pharmacy to discuss options. She is taking Amoxicillin for dental fillings on 09/06/20, so questioned if that may also play a role in what antibiotic choice is used. I will plan to follow-up with pharmacy as well tomorrow and request again that someone talk with Mrs. Rebekah Chesterfield daughter Amy. Amy also indicated that providers discussed rechecking anemia and renal function again when she has  labs on 09/07/20 since she had some medication adjustments. She also had a CXR and preoperative pulmonology evaluation this afternoon by Geraldo Pitter, NP. Pulmonary risk outlined below. She is also purchasing an incentive spirometer and will start using that before surgery.    RISK FOR PROLONGED MECHANICAL VENTILAION - > 48h   1A) Arozullah - Prolonged mech ventilation risk Arozullah Postperative Pulmonary Risk Score - for mech ventilation dependence >48h [employer], Ann Surg 2000, major non-cardiac surgery) Comment Score  Type of surgery - abd ao aneurysm (27), thoracic (21), neurosurgery / upper abdominal / vascular (21), neck (11) Nasal polyp surgery 5  Emergency Surgery - (11)   0  ALbumin < 3 or poor nutritional state - (9)   0  BUN > 30 -  (8)   0  Partial or completely dependent functional status - (7)   0  COPD -  (6)   6  Age - 60 to 69 (4), > 70  (6)   6  TOTAL   17  Risk Stratifcation scores  - <  10 (0.5%), 11-19 (1.8%), 20-27 (4.2%), 28-40 (10.1%), >40 (26.6%)   1.8% risk for prolonged mech ventilation        1B) GUPTA - Prolonged Mech Vent Risk Score source Risk  Guptal post op prolonged mech ventilation > 48h or reintubation < 30 days - ACS 2007-2008 dataset - http://lewis-perez.info/ 1.4 % Risk of mechanical ventilation for >48 hrs after surgery, or unplanned intubation ?30 days of surgery      2) RISK FOR POST OP PNEUMONIA Score source Risk  Lyndel Safe - Post Op Pnemounia risk  [email or web address] 1.1 % Risk of postoperative pneumonia      3) RISK FOR ANY POST-OP PULMONARY COMPLICATION Score source Risk  CANET/ARISCAT Score - risk for ANY/ALl pulmonary complications - > risk of in-hospital post-op pulmonary complications (composite including respiratory failure, respiratory infection, pleural effusion, atelectasis, pneumothorax, bronchospasm, aspiration pneumonitis)  [email or web address] - based on age, anemia, pulse ox, resp infection prior 30d, incision site, duration of surgery, and emergency v elective surgery Intermediate risk 13.3% risk of in-hospital post-op pulmonary complications (composite including respiratory failure, respiratory infection, pleural effusion, atelectasis, pneumothorax, bronchospasm, aspiration pneumonitis)     I also reviewed ACS NSQIP overall risk for serious complication calculated at 9.9%, any complication and  3.7% risk for any complication. Revised Cardiac Risk Index for Pre-Operative Risk calculated at 6.0% for 30-day risk of death,MI or cardiac arrest, up to 10.1% if CHF history is included (but no active symptoms).    ADDENDUM 09/07/20 1:03 PM: I spoke with pharmacy on 09/06/20. I was told an ID Pharmacist would call and speak with patient's daughter Amy about her antibiotic and allergies concerns, and possibly about her questions about post-operative pain medication options. I have notified Carla at Dr. Victorio Palm office of this incase Amy discusses this with him on the day of surgery. I do see that PCN has now been removed from her allergy list as Mrs. Ostlund has been able to take medications from this class recently without incident.  09/05/20 primary care office note by Collene Leyden, MD (and not Kelton Pillar, MD) reviewed and is consistent with the information given to me by Amy. 09/04/20 GI note by Dr. Michail Sermon reviewed. He noted 09/01/20 lab results. Abnormal transaminases felt likely due to either autoimmune hepatitis or drug-induced liver injury. He recommended conservative management for now, and liver biopsy not planned due to advanced age and potential complications. If LFTs rise markedly in the future then may consider another course of steroids. He noted plans to repeat HFP prior to surgery, this was done along with CBC, BMET, PT/INR this morning. This showed stable LFTs  with alk phose 319 (previously 338), AST 104 (previously 151), ALT 133 (previously 125), BUN/Creatinine improved to 22/1.27 (down from 56/1.76), CBC stable with H/H 11.7/36.6 and PLT count of 244. LFTs remain elevated but stable. Per recent GI visit, no plan for intervention at this time. Renal function improved with medications changes. I do not feel any additional preoperative labs are warranted.  Reviewed repeat LFTs and GI input with anesthesiologist Oren Bracket, MD who agrees. Anesthesia team to evaluate on the day of surgery.    VS: BP 131/61   Pulse 74   Temp (!) 36.4 C (Oral)   Resp 19   Ht 5' (1.524 m)   Wt 53.1 kg   SpO2 100%   BMI 22.85 kg/m    PROVIDERS: Kelton Pillar, MD is PCP Adrian Prows, MD is cardiologist Lauree Chandler, MD is Structural Heart cardiologist Margaretha Seeds, MD  is pulmonologist Wilford Corner, MD is GI [facility]) Dede Query, PA-C is hematology provider. Last visit 08/04/20 normocytic anemia   LABS: Preoperative labs noted. Creatinine 1.76, previously 1.42-1.78 since 05/30/20. She does not yet see a nephrologist but hematology did forward 08/04/20 Cr 1.78 to PCP to consider nephrology referral. WBC 13.9, but also recently completed a steroid taper for a rash. LFTs further elevated AST 151 (previously 56<-76) and ALT 125 (45<-48) since 05/30/20. PLT count normal. Patient recently stopped her statin due to age and concern that it contributed to a recent rash. SEE DISCUSSION.  (all labs ordered are listed, but only abnormal results are displayed)  Labs Reviewed  COMPREHENSIVE METABOLIC PANEL - Abnormal; Notable for the following components:      Result Value   Chloride 97 (*)    Glucose, Bld 150 (*)    BUN 56 (*)    Creatinine, Ser 1.76 (*)    AST 151 (*)    ALT 125 (*)    Alkaline Phosphatase 338 (*)    GFR, Estimated 28 (*)    All other components within normal limits  CBC - Abnormal; Notable for the following components:   WBC  13.9 (*)    RDW 15.7 (*)    All other components within normal limits     PFT 05/05/20: FVC 1.7 (91%) FEV1 1.26 (93%) Ratio 73  TLC 84% DLCO 43% Interpretation:  Normal spirometry. No obstructive or restrictive defect   IMAGES: CXR 09/05/20:  FINDINGS: Mediastinum is normal. Heart size normal. Prior cardiac valve replacement. No focal infiltrate. No pleural effusion or pneumothorax. Large sliding hiatal hernia again noted. Stable lower thoracic vertebral body compression fracture. IMPRESSION: 1. Prior cardiac valve replacement. Heart size normal. No acute cardiopulmonary disease. 2.  Large sliding hiatal hernia again noted.  VQ Scan 05/05/2020: IMPRESSION: Pulmonary embolism absent (normal or very low probability) per the modified perfusion-only PIOPED II criteria  CT Chest 12/23/19: IMPRESSION: 1. Signs of pulmonary emphysema. 2. Stable small pulmonary nodules over nearly 18 month interval. While this is likely benign, given that the RIGHT middle lobe nodule shows mild ground-glass changes with suggestion of part solid features would suggest a 1 year follow-up to document continued stability. LEFT lower lobe nodule has a benign appearance. 3. Signs of trans arterial aortic valve replacement. 4. Three-vessel coronary artery calcification. 5. Large hiatal hernia. 6. Nodular parenchymal pattern in the liver as before. 7. Emphysema and aortic atherosclerosis. - Aortic Atherosclerosis (ICD10-I70.0) and Emphysema (ICD10-J43.9).  CT Abd 01/19/20: IMPRESSION: 1. Stable cirrhotic changes involving the liver but no worrisome hepatic lesions to suggest dysplastic nodules or HCC. 2. Large hiatal hernia with most of the stomach up in the chest. 3. Interval enlargement of an anterior abdominal wall hernia containing fat. 4. Advanced atherosclerotic calcifications involving the aorta and iliac arteries.   EKG: 05/04/2020: Normal sinus rhythm Left axis deviation Minimal voltage  criteria for LVH, may be normal variant ( Cornell product ) Anterior infarct , age undetermined Abnormal ECG lateral st changes resolved since last tracing Confirmed by Dorie Rank [phone]) on 05/04/2020 5:35:23 PM   CV: Lexiscan Tetrofosmin Stress Test  05/15/2020: Nondiagnostic ECG stress with lexiscan infusion. Resting EKG demonstrated normal sinus rhythm with left bundle branch block (IVCD). Peak EKG revealed no significant ST-T change from baseline abnormality. Myocardial perfusion is normal. Overall LV systolic function is normal without regional wall motion abnormalities. Stress LV EF: 69%.  No change from 04/25/2017. Low risk.    Echo (Limited) 05/05/2020: IMPRESSIONS  1. Left ventricular ejection fraction, by estimation, is 55 to 60%. The  left ventricle has normal function. The left ventricle has no regional  wall motion abnormalities. There is mild left ventricular hypertrophy.  Left ventricular diastolic function  could not be evaluated.   2. Right ventricular systolic function is normal. The right ventricular  size is normal. There is normal pulmonary artery systolic pressure.   3. Well seated 23 mm Evolute Pro TAVR valve. Mild paravalvular leak. Mean  PG 14 mmhg only mildly higher than prior (9 mmHg). DVR of 0.44 suggests no  significant aortic stenosis.   4. Mild mitral leaflet thickening. Trivial mitral regurgitation.   5. Mild to moderate tricuspid regurgitation. Estimated PASP 32 mmHg.   6. Mild pulmonic regurgitation.   7. Compared to previous outpatient study on 02/15/2020, no major changes  noted.    Echocardiogram 02/15/2020:  Normal LV systolic function with visual EF 55-60%. Left ventricle cavity  is normal in size. Mild to moderate left ventricular hypertrophy. Normal  global wall motion. Doppler evidence of grade II diastolic dysfunction,  elevated LAP.  Left atrial cavity is mildly dilated.  Bioprosthetic aortic valve (TAVR 23 mm Evolute Pro 01/26/2019)  well  seated, trivial paravalvular regurgitation.   No aortic stenosis. Mild to  moderate valvular regurgitation.  Mild (Grade I) mitral regurgitation.  Moderate tricuspid regurgitation. No evidence of pulmonary hypertension.  Mild pulmonic regurgitation.  Compared to prior study dated 09/28/2019: G1DD is now G2DD with elevated  LAP otherwise no significant change.   Carotid artery duplex 02/15/2020:  Doppler velocity suggests stenosis in the right internal carotid artery  (50-69%). Stenosis in the right external carotid artery (<50%).  Doppler velocity suggests stenosis in the left internal carotid artery  (16-49%). Stenosis in the left external carotid artery (<50%).  Antegrade right vertebral artery flow. Antegrade left vertebral artery  flow.  Compared to 09/28/2019, there is progression of disease severity in right  and regression in the left carotid artery. Follow up in six months is  appropriate if clinically indicated.   Left Heart Catheterization 01/05/2019:  FINDINGS: Left Anterior Descending Prox LAD lesion is 80% stenosed. Mid LAD lesion is 40% stenosed. First Diagonal Branch 1st Diag lesion is 90% stenosed. The lesion is type C and located at the major branch. The lesion is calcified. The stenosis was measured by a visual reading. Ramus Intermedius Ramus lesion is 90% stenosed. PCI: Successful orbital atherectomy followed by stenting of the proximal LAD with a 3.0 x 18 mm resolute Onyx DES and proximal segment postdilated with a 3.5 x 8 mm Sapphire [place].  Stenosis 90% to 0%.  TIMI-3 to TIMI-3 flow.  Orbital atherectomy followed by balloon angioplasty with 2.5 x 15 mm Sapphire balloon for D1, 90% to less than 15 to 20% stenosis.  TIMI-3 to TIMI-3 flow.     Right and left heart catheterization 12/15/2018: RA 5/5, mean 5; RV 31/3, EDP 6; PA 32/14, mean 21, PA saturation 67%; PW 11/10, mean 10 mmHg.  CO 3.84, CI  2.5756. QP/QS 1.0.  Normal right heart catheterization. Mildly  elevated LVEDP at 15 mmHg.  Peak aortic valve gradient of 27, mean 23.6 mmHg with calculated aortic valve area 0.76 cm.  Findings consistent with severe calcific degenerative aortic valve stenosis. Left dominant circulation, proximal LAD 80% stenosis, large D1 with ostial 90% stenosis, medium sized RI with a proximal 90% stenosis, dominant circumflex mild disease.  RCA mild disease and nondominant. Recommendation: Patient will need to be evaluated  for CABG and aortic valve replacement.  If the TAVR team feels she is a good candidate for TAVR, we could certainly consider either medical management of CAD or PCI to proximal LAD and TAVR.      CT Coronary 01/01/2019: IMPRESSION: 1. Severe aortic stenosis. 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 2. Annular area 3.03 cm2 (perimeter 62.3 mm). No significant annular/subannular calcification noted. 3. Sufficient coronary to annulus distance. 4. Optimum Fluoroscopic Angle for Delivery: LAO 25 CAU 5    Past Medical History:  Diagnosis Date   Arthritis    Asthma    history of   CAD (coronary artery disease)    Cirrhosis (HCC)    CKD (chronic kidney disease)    Dyspnea    sometimes sitting.lying and on exertion   Emphysema lung (HCC)    GERD (gastroesophageal reflux disease)    History of hiatal hernia    History of transcatheter aortic valve replacement (TAVR) 01/26/2019   (TAVR  23 mm Evolute Pro in aortic position 01/26/2019) .    Hyperlipidemia    Hypertension    Hypothyroidism    Lipoma    left arm   Nasal polyps    Pneumonia    Pulmonary nodule    noted on pre TAVR CT   Samter's triad    Severe aortic stenosis     Past Surgical History:  Procedure Laterality Date   CHOLECYSTECTOMY  02/27/04   CORONARY ATHERECTOMY N/A 01/05/2019   Procedure: CORONARY ATHERECTOMY;  Surgeon: Adrian Prows, MD;  Location: [facility];  Service: Cardiovascular;  Laterality: N/A;   CORONARY BALLOON ANGIOPLASTY N/A  01/05/2019   Procedure: CORONARY BALLOON ANGIOPLASTY;  Surgeon: Adrian Prows, MD;  Location: [facility];  Service: Cardiovascular;  Laterality: N/A;   CORONARY STENT INTERVENTION N/A 01/05/2019   Procedure: CORONARY STENT INTERVENTION;  Surgeon: Adrian Prows, MD;  Location: [facility];  Service: Cardiovascular;  Laterality: N/A;   CORONARY STENT INTERVENTION  01/05/2019   INCONTINENCE SURGERY  09/12/09   NASAL SINUS SURGERY     several     ORIF ANKLE FRACTURE Right 09/11/2013   Procedure: OPEN REDUCTION INTERNAL FIXATION (ORIF) ANKLE FRACTURE;  Surgeon: Augustin Schooling, MD;  Location: [facility];  Service: Orthopedics;  Laterality: Right;   RIGHT/LEFT HEART CATH AND CORONARY ANGIOGRAPHY N/A 12/15/2018   Procedure: RIGHT/LEFT HEART CATH AND CORONARY ANGIOGRAPHY;  Surgeon: Adrian Prows, MD;  Location: [facility] CV LAB;  Service: Cardiovascular;  Laterality: N/A;   ROTATOR CUFF REPAIR  09/27/2003   TRANSCATHETER AORTIC VALVE REPLACEMENT, TRANSFEMORAL  01/26/2019   TRANSCATHETER AORTIC VALVE REPLACEMENT, TRANSFEMORAL N/A 01/26/2019   Procedure: TRANSCATHETER AORTIC VALVE REPLACEMENT, TRANSFEMORAL;  Surgeon: Burnell Blanks, MD;  Location: [place];  Service: Open Heart Surgery;  Laterality: N/A;   TRIGGER FINGER RELEASE  10/29/06    MEDICATIONS:  albuterol (VENTOLIN HFA) 108 (90 Base) MCG/ACT inhaler   amoxicillin (AMOXIL) 500 MG capsule   Biotin 5 MG CAPS   calcium-vitamin D (OSCAL WITH D) 250-125 MG-UNIT tablet   clopidogrel (PLAVIX) 75 MG tablet   diphenhydrAMINE (BENADRYL) 25 mg capsule   ferrous sulfate 325 (65 FE) MG EC tablet   fexofenadine (ALLEGRA) 180 MG tablet   furosemide (LASIX) 20 MG tablet   levothyroxine (SYNTHROID) 50 MCG tablet   meclizine (ANTIVERT) 25 MG tablet   mometasone (NASONEX) 50 MCG/ACT nasal spray   montelukast (SINGULAIR) 10 MG tablet   Multiple Vitamin (MULTIVITAMIN WITH MINERALS) TABS  tablet   Peak Flow Meter DEVI   Polyethyl Glycol-Propyl Glycol  (SYSTANE) 0.4-0.3 % SOLN   potassium chloride (KLOR-CON) 10 MEQ tablet   predniSONE (STERAPRED UNI-PAK 21 TAB) 10 MG (21) TBPK tablet   propranolol (INDERAL) 20 MG tablet   rosuvastatin (CRESTOR) 10 MG tablet   SALINE NASAL SPRAY NA   Tiotropium Bromide Monohydrate (SPIRIVA RESPIMAT) 2.5 MCG/ACT AERS   TURMERIC PO   No current facility-administered medications for this encounter.    Myra Gianotti, PA-C Surgical Short Stay/Anesthesiology [facility] Phone [phone] [facility] Phone [phone] 09/01/2020 7:18 PM

## 2020-09-01 NOTE — Progress Notes (Signed)
PCP - Dr. Kelton Pillar Cardiologist - Dr. Adrian Prows Pulmonologist - Dr. Loanne Drilling  PPM/ICD - n/a Device Orders -  Rep Notified -   Chest x-ray - 05/04/20 EKG - 05/05/20 Stress Test - 05/15/20 ECHO - 05/05/20 Cardiac Cath - 01/26/19 PFTs 05/05/20  Sleep Study - n/a  CPAP -   Fasting Blood Sugar - n/a Checks Blood Sugar _____ times a day  Blood Thinner Instructions: Hold plavix 7 days prior to surgery per Dr. Victorio Palm office Aspirin Instructions: patient has allergy to aspirin  ERAS Protcol - NPO p midnight PRE-SURGERY Ensure or G2-   COVID TEST- n/a, patient scheduled for ambulatory surgery   Anesthesia review: yes, significant cardiac, pulmonary history.  Daughter, Amy, present at PAT appointment, requesting to see APP - has many questions and concerns regarding surgery.  Patient denies shortness of breath, fever, cough and chest pain at PAT appointment   All instructions explained to the patient, with a verbal understanding of the material. Patient agrees to go over the instructions while at home for a better understanding. Patient also instructed to wear mask several days prior to surgery if out in public.

## 2020-09-04 ENCOUNTER — Telehealth: Payer: Self-pay | Admitting: Student

## 2020-09-04 DIAGNOSIS — N289 Disorder of kidney and ureter, unspecified: Secondary | ICD-10-CM

## 2020-09-04 NOTE — Telephone Encounter (Signed)
ON-CALL CARDIOLOGY 09/01/2020   Patient's name: [name] [name].   MRN: [id number].    DOB: [birth year] Primary care provider: Kelton Pillar, MD. Primary cardiologist: Dr. Einar Gip  Interaction regarding this patient's care today: Patient's daughter called with concerns that during preadmission testing for patient's upcoming ENT surgery CMP revealed increase of creatinine and BUN as well as decreased GFR and elevated liver enzymes.  Patient has been taking Lasix 20 mg daily.  Impression:   ICD-10-CM   1. Renal insufficiency  N28.9       No orders of the defined types were placed in this encounter.   No orders of the defined types were placed in this encounter.   Recommendations: Advised patient return to as needed Lasix dosing.  Discussed at length with patient and her daughter regarding indications for Lasix with as needed use, they both verbalized understanding agreement.  We will repeat BMP in 1 to 2 weeks.  Patient's daughter is also requesting referral to nephrology for further evaluation.  Telephone encounter total time: 12 minutes     Alethia Berthold, PA-C 09/04/2020, 10:00 AM Office: [phone]

## 2020-09-05 ENCOUNTER — Other Ambulatory Visit: Payer: Self-pay

## 2020-09-05 ENCOUNTER — Ambulatory Visit: Payer: Federal, State, Local not specified - PPO | Admitting: Primary Care

## 2020-09-05 ENCOUNTER — Ambulatory Visit (INDEPENDENT_AMBULATORY_CARE_PROVIDER_SITE_OTHER): Payer: Federal, State, Local not specified - PPO

## 2020-09-05 ENCOUNTER — Encounter: Payer: Self-pay | Admitting: Primary Care

## 2020-09-05 VITALS — BP 122/64 | HR 74 | Temp 97.8°F | Resp 16 | Ht 60.0 in | Wt 118.6 lb

## 2020-09-05 DIAGNOSIS — J339 Nasal polyp, unspecified: Secondary | ICD-10-CM | POA: Diagnosis not present

## 2020-09-05 DIAGNOSIS — Z01811 Encounter for preprocedural respiratory examination: Secondary | ICD-10-CM | POA: Diagnosis not present

## 2020-09-05 DIAGNOSIS — J432 Centrilobular emphysema: Secondary | ICD-10-CM

## 2020-09-05 NOTE — Progress Notes (Signed)
$'@Patient'z$  ID: [name] [name], female    DOB: 02/21/1931, 87 y.o.   MRN: [id number]  Chief Complaint  Patient presents with   Follow-up    Patient does not have any concerns.     Referring provider: Kelton Pillar, MD  HPI: 87 year old female, never smoked. PMH significant for centrilobular emphysema, nasal polyposis, allergic rhinitis, HTN, CAD, severe aortic stenosis. Patient of Dr. Loanne Drilling, last seen on 05/18/20. Maintained on Spiriva Respimat 2.64mg and prn albuterol hfa.   09/05/2020 Patient presents today for surgical risk assessment. She is planning for upcoming bilateral revision of endoscopic sinus surgery with fusion and intranasal polypectomy with Dr. SWilburn Corneliaon August 19th.   She had pre-op apt with Dr. SWilburn Cornelia labs showed elevated liver and kidney function. She is taking iron. Hgb was 3.96. They are worried about singular possibly causing elevation in her LFTs along with her Crestor. Her liver function did improve with steroids. She was treated for sinusitis with Augmentin x 10 days end of July.   Pulmonary function testing in April 2022 showed normal spirometry. She had no obstructive or restrictive defect. Moderately reduced diffusion capacity that correct for lung volumes.   Allergies  Allergen Reactions   Aspirin Anaphylaxis   Bee Venom Anaphylaxis   Ceclor [Cefaclor] Anaphylaxis   Pneumococcal Vac Polyvalent     Other reaction(s): swelling and erythema   Statins Other (See Comments)    Elevated liver enzymes   Crestor [Rosuvastatin] Rash   Sulfa Antibiotics Rash    Immunization History  Administered Date(s) Administered   Influenza Whole 10/27/2015   PFIZER(Purple Top)SARS-COV-2 Vaccination 02/25/2019, 03/24/2019, 11/08/2019   Pneumococcal Polysaccharide-23 01/21/2006   Tdap 10/27/2015    Past Medical History:  Diagnosis Date   Arthritis    Asthma    history of   CAD (coronary artery disease)    [place]    CKD (chronic kidney disease)     Dyspnea    sometimes sitting.lying and on exertion   Emphysema lung (HCC)    GERD (gastroesophageal reflux disease)    History of hiatal hernia    History of transcatheter aortic valve replacement (TAVR) 01/26/2019   (TAVR  23 mm Evolute Pro in aortic position 01/26/2019) .    Hyperlipidemia    Hypertension    Hypothyroidism    Lipoma    left arm   Nasal polyps    Pneumonia    Pulmonary nodule    noted on pre TAVR CT   Samter's triad    Severe aortic stenosis     Tobacco History: Social History   Tobacco Use  Smoking Status Never  Smokeless Tobacco Never   Counseling given: Not Answered   Outpatient Medications Prior to Visit  Medication Sig Dispense Refill   albuterol (VENTOLIN HFA) 108 (90 Base) MCG/ACT inhaler Inhale 2 puffs into the lungs every 6 (six) hours as needed for shortness of breath (use as needed or before activity for shortness of breath or whezzing). 8 g 6   diphenhydrAMINE (BENADRYL) 25 mg capsule Take 25 mg by mouth every 6 (six) hours as needed.     diphenhydrAMINE-zinc acetate (BENADRYL ITCH STOPPING) cream Apply 1 each topically 3 (three) times daily as needed for itching. 3 times daily.     ferrous sulfate 325 (65 FE) MG EC tablet Take 1 tablet (325 mg total) by mouth daily with breakfast. 30 tablet 3   hydrocortisone cream 1 % Apply 1 application topically 3 (three) times daily.  levothyroxine (SYNTHROID) 50 MCG tablet Take 50 mcg by mouth daily before breakfast.     meclizine (ANTIVERT) 25 MG tablet Take 25 mg by mouth 3 (three) times daily as needed for dizziness.     montelukast (SINGULAIR) 10 MG tablet Take 1 tablet (10 mg total) by mouth daily. (Patient taking differently: Take 10 mg by mouth at bedtime.) 30 tablet 6   Multiple Vitamin (MULTIVITAMIN WITH MINERALS) TABS tablet Take 1 tablet by mouth daily.     Peak Flow Meter DEVI 1 puff by Does not apply route in the morning and at bedtime. 1 each 0   Polyethyl Glycol-Propyl Glycol (SYSTANE)  0.4-0.3 % SOLN Place 1 drop into both eyes 3 (three) times daily as needed (dry/irritated eyes.).     potassium chloride (KLOR-CON) 10 MEQ tablet Take 1 tablet (10 mEq total) by mouth daily. 90 tablet 3   propranolol (INDERAL) 20 MG tablet Take 1 tablet (20 mg total) by mouth 3 (three) times daily. 90 tablet 3   SALINE NASAL SPRAY NA Place 1 spray into the nose daily as needed (congestion).     Tiotropium Bromide Monohydrate (SPIRIVA RESPIMAT) 2.5 MCG/ACT AERS Inhale 2 puffs into the lungs daily. 4 g 0   TURMERIC PO Take 538 mg by mouth daily.     amoxicillin (AMOXIL) 500 MG capsule Take 1 capsule (500 mg total) by mouth as needed. Take medication only before dental procedures. 4 capsule 4   furosemide (LASIX) 20 MG tablet PLEASE SEE ATTACHED FOR DETAILED DIRECTIONS (Patient taking differently: Take 20 mg by mouth daily. As needed.) 180 tablet 0   mometasone (NASONEX) 50 MCG/ACT nasal spray Place 2 sprays into the nose daily.     Biotin 5 MG CAPS Take 500 mg by mouth daily.     calcium-vitamin D (OSCAL WITH D) 250-125 MG-UNIT tablet Take 1 tablet by mouth daily.     clopidogrel (PLAVIX) 75 MG tablet TAKE 1 TABLET BY MOUTH EVERY DAY 90 tablet 1   fexofenadine (ALLEGRA) 180 MG tablet Take 180 mg by mouth daily as needed for allergies or rhinitis. (Patient not taking: Reported on 09/05/2020)     predniSONE (STERAPRED UNI-PAK 21 TAB) 10 MG (21) TBPK tablet Take 10 mg by mouth 4 (four) times daily. (Patient not taking: Reported on 09/05/2020)     rosuvastatin (CRESTOR) 10 MG tablet Take 1 tablet (10 mg total) by mouth daily. (Patient not taking: Reported on 09/05/2020) 90 tablet 0   No facility-administered medications prior to visit.    Review of Systems  Review of Systems  Constitutional: Negative.   Respiratory: Negative.      Physical Exam  BP 122/64 (BP Location: Left Arm, Patient Position: Sitting, Cuff Size: Normal)   Pulse 74   Temp 97.8 F (36.6 C) (Oral)   Resp 16   Ht 5' (1.524 m)    Wt 118 lb 9.6 oz (53.8 kg)   SpO2 98%   BMI 23.16 kg/m  Physical Exam HENT:     Head: Normocephalic and atraumatic.     Mouth/Throat:     Mouth: Mucous membranes are moist.     Pharynx: Oropharynx is clear.  Cardiovascular:     Rate and Rhythm: Normal rate and regular rhythm.  Skin:    General: Skin is warm and dry.  Neurological:     General: No focal deficit present.     Mental Status: She is oriented to person, place, and time. Mental status is at baseline.  Psychiatric:  Mood and Affect: Mood normal.        Behavior: Behavior normal.        Thought Content: Thought content normal.        Judgment: Judgment normal.     Lab Results:  CBC    Component Value Date/Time   WBC 8.2 09/07/2020 0942   RBC 3.89 09/07/2020 0942   HGB 11.7 (L) 09/07/2020 0942   HGB 11.1 (L) 08/04/2020 1346   HGB 9.6 (L) 02/23/2019 1159   HCT 36.6 09/07/2020 0942   HCT 30.0 (L) 02/23/2019 1159   PLT 244 09/07/2020 0942   PLT 261 08/04/2020 1346   PLT 287 02/23/2019 1159   MCV 94.1 09/07/2020 0942   MCV 91 02/23/2019 1159   MCH 30.1 09/07/2020 0942   MCHC 32.0 09/07/2020 0942   RDW 15.8 (H) 09/07/2020 0942   RDW 12.6 02/23/2019 1159   LYMPHSABS 2.2 08/04/2020 1346   MONOABS 0.8 08/04/2020 1346   EOSABS 0.1 08/04/2020 1346   BASOSABS 0.1 08/04/2020 1346    BMET    Component Value Date/Time   NA 134 (L) 09/07/2020 0942   NA 138 08/16/2020 1146   K 4.0 09/07/2020 0942   CL 100 09/07/2020 0942   CO2 26 09/07/2020 0942   GLUCOSE 152 (H) 09/07/2020 0942   BUN 22 09/07/2020 0942   BUN 25 08/16/2020 1146   CREATININE 1.27 (H) 09/07/2020 0942   CREATININE 1.78 (H) 08/04/2020 1346   CALCIUM 9.2 09/07/2020 0942   GFRNONAA 41 (L) 09/07/2020 0942   GFRNONAA 27 (L) 08/04/2020 1346   GFRAA 41 (L) 11/30/2019 1007    BNP    Component Value Date/Time   BNP 94.1 05/04/2020 1724    ProBNP No results found for: PROBNP  Imaging: DG Chest 2 View  Result Date:  09/06/2020 CLINICAL DATA:  Preoperative exam. EXAM: CHEST - 2 VIEW COMPARISON:  Chest x-ray 05/04/2020. FINDINGS: Mediastinum is normal. Heart size normal. Prior cardiac valve replacement. No focal infiltrate. No pleural effusion or pneumothorax. Large sliding hiatal hernia again noted. Stable lower thoracic vertebral body compression fracture. IMPRESSION: 1. Prior cardiac valve replacement. Heart size normal. No acute cardiopulmonary disease. 2.  Large sliding hiatal hernia again noted. Electronically Signed   By: Marcello Moores  Register M.D.   On: 09/06/2020 10:04     Assessment & Plan:   Nasal polyposis Patient will be considered low-intermediate risk for prolonged mechanical ventilator and/or post op pneumonia/pulmonary complications. Ultimate clearance is decided by surgeon and anesthesiology    Recommendations: - Stop Montelukast d.t elevated liver enzymes which can be seen in 1-2% population. Discuss restarting after surgery with Dr. Wilburn Cornelia  - Encourage early ambulation, DVT prophylaxis, continue bronchodilators and use incentive spirometer  Centrilobular emphysema (HCC) - Stable interval  - Needs Pre-op CXR today  - Continue Spiriva respimat 2.80mg two puffs once daily - Follow-up 2-3 months with Dr. ELoanne Drilling    1) RISK FOR PROLONGED MECHANICAL VENTILAION - > 48h  1A) Arozullah - Prolonged mech ventilation risk Arozullah Postperative Pulmonary Risk Score - for mech ventilation dependence >48h ([employer] Ann Surg 2000, major non-cardiac surgery) Comment Score  Type of surgery - abd ao aneurysm (27), thoracic (21), neurosurgery / upper abdominal / vascular (21), neck (11) Nasal polyp surgery 5  Emergency Surgery - (11)  0  ALbumin < 3 or poor nutritional state - (9)  0  BUN > 30 -  (8)  0  Partial or completely dependent functional status - (7)  0  COPD -  (6)  6  Age - 29 to 28 (4), > 70  (6)  6  TOTAL  17  Risk Stratifcation scores  - < 10 (0.5%), 11-19 (1.8%), 20-27  (4.2%), 28-40 (10.1%), >40 (26.6%)  1.8% risk for prolonged mech ventilation       1B) GUPTA - Prolonged Mech Vent Risk Score source Risk  Guptal post op prolonged mech ventilation > 48h or reintubation < 30 days - ACS 2007-2008 dataset - http://lewis-perez.info/ 1.4 % Risk of mechanical ventilation for >48 hrs after surgery, or unplanned intubation ?30 days of surgery    2) RISK FOR POST OP PNEUMONIA Score source Risk  Lyndel Safe - Post Op Pnemounia risk  [employer] 1.1 % Risk of postoperative pneumonia    R3) ISK FOR ANY POST-OP PULMONARY COMPLICATION Score source Risk  CANET/ARISCAT Score - risk for ANY/ALl pulmonary complications - > risk of in-hospital post-op pulmonary complications (composite including respiratory failure, respiratory infection, pleural effusion, atelectasis, pneumothorax, bronchospasm, aspiration pneumonitis) [employer] - based on age, anemia, pulse ox, resp infection prior 30d, incision site, duration of surgery, and emergency v elective surgery Intermediate risk 13.3% risk of in-hospital post-op pulmonary complications (composite including respiratory failure, respiratory infection, pleural effusion, atelectasis, pneumothorax, bronchospasm, aspiration pneumonitis)     Martyn Ehrich, NP 09/15/2020

## 2020-09-05 NOTE — Patient Instructions (Signed)
You are considered low-intermediate risk for prolonged mechanical ventilator and/or post op pneumonia/pulmonary complications. Ultimate clearance is decided by surgeon and anesthesiology   Recommendations: - Continue Spiriva respimat 2.49mg two puffs once daily - Stop Montelukast d.t elevated liver enzymes which can be seen in 1-2% population. Discuss restarting after surgery with Dr. SWilburn Cornelia - Encourage early ambulation, DVT prophylaxis, continue bronchodilators and use incentive spirometer  Orders: - Pre-op CXR today   Follow-up: - 2-3 months with Dr. ELoanne Drilling

## 2020-09-06 NOTE — Progress Notes (Signed)
Please let patient know PRE-OP CXR showed no acute cardiopulmonary disease. Large hiatal hernia. Prior cardiac valve replacement.

## 2020-09-07 ENCOUNTER — Encounter (HOSPITAL_COMMUNITY)
Admission: RE | Admit: 2020-09-07 | Discharge: 2020-09-07 | Disposition: A | Discharge status: Home / Self Care | Payer: Federal, State, Local not specified - PPO | Source: Ambulatory Visit | Attending: Otolaryngology | Admitting: Otolaryngology

## 2020-09-07 ENCOUNTER — Other Ambulatory Visit: Payer: Self-pay

## 2020-09-07 DIAGNOSIS — J339 Nasal polyp, unspecified: Secondary | ICD-10-CM | POA: Diagnosis not present

## 2020-09-07 DIAGNOSIS — Z881 Allergy status to other antibiotic agents status: Secondary | ICD-10-CM | POA: Diagnosis not present

## 2020-09-07 DIAGNOSIS — Z01812 Encounter for preprocedural laboratory examination: Secondary | ICD-10-CM | POA: Insufficient documentation

## 2020-09-07 DIAGNOSIS — Z825 Family history of asthma and other chronic lower respiratory diseases: Secondary | ICD-10-CM | POA: Diagnosis not present

## 2020-09-07 DIAGNOSIS — Z888 Allergy status to other drugs, medicaments and biological substances status: Secondary | ICD-10-CM | POA: Diagnosis not present

## 2020-09-07 DIAGNOSIS — Z882 Allergy status to sulfonamides status: Secondary | ICD-10-CM | POA: Diagnosis not present

## 2020-09-07 DIAGNOSIS — N189 Chronic kidney disease, unspecified: Secondary | ICD-10-CM | POA: Diagnosis not present

## 2020-09-07 DIAGNOSIS — J439 Emphysema, unspecified: Secondary | ICD-10-CM | POA: Diagnosis not present

## 2020-09-07 DIAGNOSIS — Z952 Presence of prosthetic heart valve: Secondary | ICD-10-CM | POA: Diagnosis not present

## 2020-09-07 DIAGNOSIS — I129 Hypertensive chronic kidney disease with stage 1 through stage 4 chronic kidney disease, or unspecified chronic kidney disease: Secondary | ICD-10-CM | POA: Diagnosis not present

## 2020-09-07 DIAGNOSIS — E785 Hyperlipidemia, unspecified: Secondary | ICD-10-CM | POA: Diagnosis not present

## 2020-09-07 DIAGNOSIS — K7460 Unspecified cirrhosis of liver: Secondary | ICD-10-CM | POA: Diagnosis not present

## 2020-09-07 DIAGNOSIS — E039 Hypothyroidism, unspecified: Secondary | ICD-10-CM | POA: Diagnosis not present

## 2020-09-07 DIAGNOSIS — Z8249 Family history of ischemic heart disease and other diseases of the circulatory system: Secondary | ICD-10-CM | POA: Diagnosis not present

## 2020-09-07 DIAGNOSIS — Z886 Allergy status to analgesic agent status: Secondary | ICD-10-CM | POA: Diagnosis not present

## 2020-09-07 DIAGNOSIS — Z9049 Acquired absence of other specified parts of digestive tract: Secondary | ICD-10-CM | POA: Diagnosis not present

## 2020-09-07 DIAGNOSIS — I2510 Atherosclerotic heart disease of native coronary artery without angina pectoris: Secondary | ICD-10-CM | POA: Diagnosis not present

## 2020-09-07 DIAGNOSIS — Z955 Presence of coronary angioplasty implant and graft: Secondary | ICD-10-CM | POA: Diagnosis not present

## 2020-09-07 DIAGNOSIS — J324 Chronic pansinusitis: Secondary | ICD-10-CM | POA: Diagnosis present

## 2020-09-07 DIAGNOSIS — Z887 Allergy status to serum and vaccine status: Secondary | ICD-10-CM | POA: Diagnosis not present

## 2020-09-07 LAB — HEPATIC FUNCTION PANEL
ALT: 133 U/L — ABNORMAL HIGH (ref 0–44)
AST: 104 U/L — ABNORMAL HIGH (ref 15–41)
Albumin: 3.2 g/dL — ABNORMAL LOW (ref 3.5–5.0)
Alkaline Phosphatase: 319 U/L — ABNORMAL HIGH (ref 38–126)
Bilirubin, Direct: 0.3 mg/dL — ABNORMAL HIGH (ref 0.0–0.2)
Indirect Bilirubin: 0.8 mg/dL (ref 0.3–0.9)
Total Bilirubin: 1.1 mg/dL (ref 0.3–1.2)
Total Protein: 6.7 g/dL (ref 6.5–8.1)

## 2020-09-07 LAB — BASIC METABOLIC PANEL
Anion gap: 8 (ref 5–15)
BUN: 22 mg/dL (ref 8–23)
CO2: 26 mmol/L (ref 22–32)
Calcium: 9.2 mg/dL (ref 8.9–10.3)
Chloride: 100 mmol/L (ref 98–111)
Creatinine, Ser: 1.27 mg/dL — ABNORMAL HIGH (ref 0.44–1.00)
GFR, Estimated: 41 mL/min — ABNORMAL LOW (ref >60)
Glucose, Bld: 152 mg/dL — ABNORMAL HIGH (ref 70–99)
Potassium: 4 mmol/L (ref 3.5–5.1)
Sodium: 134 mmol/L — ABNORMAL LOW (ref 135–145)

## 2020-09-07 LAB — CBC
HCT: 36.6 % (ref 36.0–46.0)
Hemoglobin: 11.7 g/dL — ABNORMAL LOW (ref 12.0–15.0)
MCH: 30.1 pg (ref 26.0–34.0)
MCHC: 32 g/dL (ref 30.0–36.0)
MCV: 94.1 fL (ref 80.0–100.0)
Platelets: 244 10*3/uL (ref 150–400)
RBC: 3.89 MIL/uL (ref 3.87–5.11)
RDW: 15.8 % — ABNORMAL HIGH (ref 11.5–15.5)
WBC: 8.2 10*3/uL (ref 4.0–10.5)
nRBC: 0 % (ref 0.0–0.2)

## 2020-09-07 LAB — PROTIME-INR
INR: 1 (ref 0.8–1.2)
Prothrombin Time: 13.5 seconds (ref 11.4–15.2)

## 2020-09-07 NOTE — Anesthesia Preprocedure Evaluation (Addendum)
Anesthesia Evaluation  Patient identified by MRN, date of birth, ID band Patient awake    Reviewed: Allergy & Precautions, NPO status , Patient's Chart, lab work & pertinent test results, reviewed documented beta blocker date and time   History of Anesthesia Complications Negative for: history of anesthetic complications  Airway Mallampati: II  TM Distance: >3 FB Neck ROM: Full    Dental  (+) Dental Advisory Given   Pulmonary asthma , COPD,  COPD inhaler,    Pulmonary exam normal        Cardiovascular hypertension, Pt. on medications and Pt. on home beta blockers + CAD and + Cardiac Stents  Normal cardiovascular exam   S/p TAVR  '22 Myoperfusion - Nondiagnostic ECG stress with lexiscan infusion. Resting EKG demonstrated normal sinus rhythm with left bundle branch block (IVCD). Peak EKG revealed no significant ST-T change from baseline abnormality. Myocardial perfusion is normal. Overall LV systolic function is normal without regional wall motion abnormalities. Stress LV EF: 69%. No change from 04/25/2017. Low risk.   '22 TTE - EF 55 to 60%. There is mild left ventricular hypertrophy. Well seated 23 mm Evolute Pro TAVR valve. Mild paravalvular leak. Mean PG 14 mmhg only mildly higher than prior (9 mmHg). DVR of 0.44 suggests no  significant aortic stenosis. Trivial mitral regurgitation. Mild to moderate tricuspid regurgitation. Mild pulmonic regurgitation.      Neuro/Psych TIAnegative psych ROS   GI/Hepatic hiatal hernia, GERD  Controlled,(+) Cirrhosis       ,   Endo/Other  Hypothyroidism   Renal/GU CRFRenal disease     Musculoskeletal  (+) Arthritis ,   Abdominal   Peds  Hematology  On plavix    Anesthesia Other Findings   Reproductive/Obstetrics                           Anesthesia Physical Anesthesia Plan  ASA: 3  Anesthesia Plan: General   Post-op Pain Management:     Induction: Intravenous  PONV Risk Score and Plan: 4 or greater and Treatment may vary due to age or medical condition, Ondansetron, Aprepitant and Propofol infusion  Airway Management Planned: Oral ETT  Additional Equipment: None  Intra-op Plan:   Post-operative Plan: Extubation in OR  Informed Consent: I have reviewed the patients History and Physical, chart, labs and discussed the procedure including the risks, benefits and alternatives for the proposed anesthesia with the patient or authorized representative who has indicated his/her understanding and acceptance.     Dental advisory given  Plan Discussed with: CRNA and Anesthesiologist  Anesthesia Plan Comments: (Extensive Anesthesia APP note written by Myra Gianotti, PA-C with summary below.  Daughter Amy is a pediatric NP. Patient has cardiology (Dr. Einar Gip) and pulmonary input (see 09/05/20 note by Geraldo Pitter, NP that outlines Pulmonary Perioperative Risk Assessment). Patient/family primary concerns are her cardiac, pulmonary, renal, and liver issues.  Last coronary stents in 12/2018, TAVR 01/2019 with mild paravalvular leak. Non-smoker, but with Samter's Triad and adult onset asthma/empysema with hospitalization in April. Has a large paraesophageal hernia that is likely contributing to pulmonary symptoms, but non-surgical management planned at this point as she wanted to avoid a higher risk procedure that could involve thoracic surgery and chest tubes. Her renal function has been trending up, and she is not yet followed by nephrology. Creatinine improved (Cr 1.76-->1.27) after Lasix changed from daily to as needed. 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 Recently AST/ALT 100's and re-evaluated by Dr. Michail Sermon on 09/04/20 who is monitoring for now and was not planning another course of steroids at this time unless worsening. 09/07/20  repeat labs showed stable LFTs.  ID Pharmacist was going to call Amy prior to surgery to discuss preoperative antibiotic concerns. PCN allergy recently removed since patient has been able to tolerate Amoxicillin and Augmentin. She was also going to discuss postoperative pain medication choices with Pharmacist and Dr. Wilburn Cornelia.      Reportedly, ambulatory surgery is anticipated; however, she wants to make sure ambulatory O2 sat is checked prior to discharge. Patient lives at home with her husband who has Alzheimer's.)      Anesthesia Quick Evaluation

## 2020-09-08 ENCOUNTER — Ambulatory Visit (HOSPITAL_COMMUNITY): Payer: Federal, State, Local not specified - PPO | Admitting: Vascular Surgery

## 2020-09-08 ENCOUNTER — Other Ambulatory Visit: Payer: Self-pay

## 2020-09-08 ENCOUNTER — Encounter (HOSPITAL_COMMUNITY): Payer: Self-pay | Admitting: Otolaryngology

## 2020-09-08 ENCOUNTER — Ambulatory Visit (HOSPITAL_COMMUNITY)
Admission: RE | Admit: 2020-09-08 | Discharge: 2020-09-08 | Disposition: A | Discharge status: Home / Self Care | Payer: Federal, State, Local not specified - PPO | Attending: Otolaryngology | Admitting: Otolaryngology

## 2020-09-08 ENCOUNTER — Encounter (HOSPITAL_COMMUNITY)
Admission: RE | Disposition: A | Discharge status: Home / Self Care | Payer: Self-pay | Source: Home / Self Care | Attending: Otolaryngology

## 2020-09-08 DIAGNOSIS — Z886 Allergy status to analgesic agent status: Secondary | ICD-10-CM | POA: Insufficient documentation

## 2020-09-08 DIAGNOSIS — N189 Chronic kidney disease, unspecified: Secondary | ICD-10-CM | POA: Insufficient documentation

## 2020-09-08 DIAGNOSIS — J329 Chronic sinusitis, unspecified: Secondary | ICD-10-CM

## 2020-09-08 DIAGNOSIS — Z955 Presence of coronary angioplasty implant and graft: Secondary | ICD-10-CM | POA: Insufficient documentation

## 2020-09-08 DIAGNOSIS — K7460 Unspecified cirrhosis of liver: Secondary | ICD-10-CM | POA: Insufficient documentation

## 2020-09-08 DIAGNOSIS — Z882 Allergy status to sulfonamides status: Secondary | ICD-10-CM | POA: Insufficient documentation

## 2020-09-08 DIAGNOSIS — J324 Chronic pansinusitis: Secondary | ICD-10-CM | POA: Insufficient documentation

## 2020-09-08 DIAGNOSIS — I2510 Atherosclerotic heart disease of native coronary artery without angina pectoris: Secondary | ICD-10-CM | POA: Insufficient documentation

## 2020-09-08 DIAGNOSIS — Z887 Allergy status to serum and vaccine status: Secondary | ICD-10-CM | POA: Insufficient documentation

## 2020-09-08 DIAGNOSIS — E785 Hyperlipidemia, unspecified: Secondary | ICD-10-CM | POA: Insufficient documentation

## 2020-09-08 DIAGNOSIS — Z8249 Family history of ischemic heart disease and other diseases of the circulatory system: Secondary | ICD-10-CM | POA: Insufficient documentation

## 2020-09-08 DIAGNOSIS — E039 Hypothyroidism, unspecified: Secondary | ICD-10-CM | POA: Insufficient documentation

## 2020-09-08 DIAGNOSIS — J339 Nasal polyp, unspecified: Secondary | ICD-10-CM | POA: Insufficient documentation

## 2020-09-08 DIAGNOSIS — Z9049 Acquired absence of other specified parts of digestive tract: Secondary | ICD-10-CM | POA: Insufficient documentation

## 2020-09-08 DIAGNOSIS — Z881 Allergy status to other antibiotic agents status: Secondary | ICD-10-CM | POA: Insufficient documentation

## 2020-09-08 DIAGNOSIS — Z952 Presence of prosthetic heart valve: Secondary | ICD-10-CM | POA: Insufficient documentation

## 2020-09-08 DIAGNOSIS — Z825 Family history of asthma and other chronic lower respiratory diseases: Secondary | ICD-10-CM | POA: Insufficient documentation

## 2020-09-08 DIAGNOSIS — J439 Emphysema, unspecified: Secondary | ICD-10-CM | POA: Insufficient documentation

## 2020-09-08 DIAGNOSIS — Z888 Allergy status to other drugs, medicaments and biological substances status: Secondary | ICD-10-CM | POA: Insufficient documentation

## 2020-09-08 DIAGNOSIS — I129 Hypertensive chronic kidney disease with stage 1 through stage 4 chronic kidney disease, or unspecified chronic kidney disease: Secondary | ICD-10-CM | POA: Insufficient documentation

## 2020-09-08 HISTORY — PX: SINUS ENDO WITH FUSION: SHX5329

## 2020-09-08 SURGERY — SURGERY, PARANASAL SINUS, ENDOSCOPIC, WITH NASAL SEPTOPLASTY, TURBINOPLASTY, AND MAXILLARY SINUSOTOMY
Anesthesia: General | Site: Nose | Laterality: Bilateral

## 2020-09-08 MED ORDER — STERILE WATER FOR IRRIGATION IR SOLN
Status: DC | PRN
  Administered 2020-09-08: 1000 mL

## 2020-09-08 MED ORDER — PROPOFOL 10 MG/ML IV BOLUS
INTRAVENOUS | Status: DC | PRN
  Administered 2020-09-08: 50 mg via INTRAVENOUS

## 2020-09-08 MED ORDER — ORAL CARE MOUTH RINSE: 15.0000 mL | Freq: Once | OROMUCOSAL | Status: AC

## 2020-09-08 MED ORDER — 0.9 % SODIUM CHLORIDE (POUR BTL) OPTIME
TOPICAL | Status: DC | PRN
  Administered 2020-09-08: 1000 mL

## 2020-09-08 MED ORDER — OXYMETAZOLINE HCL 0.05 % NA SOLN
NASAL | Status: DC | PRN
  Administered 2020-09-08: 2 via NASAL

## 2020-09-08 MED ORDER — FENTANYL CITRATE (PF) 100 MCG/2ML IJ SOLN: 25.0000 ug | INTRAMUSCULAR | Status: DC | PRN

## 2020-09-08 MED ORDER — TRIAMCINOLONE ACETONIDE 40 MG/ML IJ SUSP
INTRAMUSCULAR | Status: AC
  Filled 2020-09-08: qty 5

## 2020-09-08 MED ORDER — MUPIROCIN 2 % EX OINT
TOPICAL_OINTMENT | CUTANEOUS | Status: DC | PRN
  Administered 2020-09-08 (×2): 1 via NASAL

## 2020-09-08 MED ORDER — CHLORHEXIDINE GLUCONATE 0.12 % MT SOLN
OROMUCOSAL | Status: AC
  Administered 2020-09-08: 15 mL via OROMUCOSAL
  Filled 2020-09-08: qty 15

## 2020-09-08 MED ORDER — OXYCODONE HCL 5 MG/5ML PO SOLN
5.0000 mg | Freq: Once | ORAL | Status: DC | PRN
Start: 2020-09-08 — End: 2020-09-08

## 2020-09-08 MED ORDER — ONDANSETRON HCL 4 MG/2ML IJ SOLN
INTRAMUSCULAR | Status: DC | PRN
  Administered 2020-09-08: 4 mg via INTRAVENOUS

## 2020-09-08 MED ORDER — FENTANYL CITRATE (PF) 250 MCG/5ML IJ SOLN
INTRAMUSCULAR | Status: AC
  Filled 2020-09-08: qty 5

## 2020-09-08 MED ORDER — ROCURONIUM BROMIDE 10 MG/ML (PF) SYRINGE
PREFILLED_SYRINGE | INTRAVENOUS | Status: AC
  Filled 2020-09-08: qty 10

## 2020-09-08 MED ORDER — SODIUM CHLORIDE 0.9 % IR SOLN
Status: DC | PRN
  Administered 2020-09-08: 1000 mL

## 2020-09-08 MED ORDER — OXYCODONE HCL 5 MG PO TABS: 5.0000 mg | ORAL_TABLET | Freq: Once | ORAL | Status: DC | PRN

## 2020-09-08 MED ORDER — PROPOFOL 10 MG/ML IV BOLUS
INTRAVENOUS | Status: AC
  Filled 2020-09-08: qty 20

## 2020-09-08 MED ORDER — AMOXICILLIN 500 MG PO CAPS: 500.0000 mg | ORAL_CAPSULE | Freq: Three times a day (TID) | ORAL | 0 refills | Status: AC

## 2020-09-08 MED ORDER — LIDOCAINE-EPINEPHRINE 1 %-1:100000 IJ SOLN
INTRAMUSCULAR | Status: AC
  Filled 2020-09-08: qty 1

## 2020-09-08 MED ORDER — LACTATED RINGERS IV SOLN: INTRAVENOUS | Status: DC

## 2020-09-08 MED ORDER — LIDOCAINE 2% (20 MG/ML) 5 ML SYRINGE
INTRAMUSCULAR | Status: AC
  Filled 2020-09-08: qty 5

## 2020-09-08 MED ORDER — OXYMETAZOLINE HCL 0.05 % NA SOLN
NASAL | Status: DC | PRN
  Administered 2020-09-08: 1

## 2020-09-08 MED ORDER — TRIAMCINOLONE ACETONIDE 40 MG/ML IJ SUSP
INTRAMUSCULAR | Status: DC | PRN
  Administered 2020-09-08: 40 mg

## 2020-09-08 MED ORDER — APREPITANT 40 MG PO CAPS
40.0000 mg | ORAL_CAPSULE | Freq: Once | ORAL | Status: AC
  Administered 2020-09-08: 40 mg via ORAL
  Filled 2020-09-08: qty 1

## 2020-09-08 MED ORDER — VANCOMYCIN HCL IN DEXTROSE 1-5 GM/200ML-% IV SOLN
1000.0000 mg | INTRAVENOUS | Status: AC
Start: 2020-09-08 — End: 2020-09-08

## 2020-09-08 MED ORDER — MUPIROCIN 2 % EX OINT
TOPICAL_OINTMENT | CUTANEOUS | Status: AC
  Filled 2020-09-08: qty 22

## 2020-09-08 MED ORDER — DEXAMETHASONE SODIUM PHOSPHATE 10 MG/ML IJ SOLN
INTRAMUSCULAR | Status: AC
  Filled 2020-09-08: qty 1

## 2020-09-08 MED ORDER — SUGAMMADEX SODIUM 200 MG/2ML IV SOLN
INTRAVENOUS | Status: DC | PRN
Start: 2020-09-08 — End: 2020-09-08
  Administered 2020-09-08: 110 mg via INTRAVENOUS

## 2020-09-08 MED ORDER — FENTANYL CITRATE (PF) 100 MCG/2ML IJ SOLN
INTRAMUSCULAR | Status: DC | PRN
  Administered 2020-09-08: 50 ug via INTRAVENOUS
  Administered 2020-09-08: 25 ug via INTRAVENOUS

## 2020-09-08 MED ORDER — ARTIFICIAL TEARS OPHTHALMIC OINT
TOPICAL_OINTMENT | OPHTHALMIC | Status: AC
  Filled 2020-09-08: qty 3.5

## 2020-09-08 MED ORDER — VANCOMYCIN HCL IN DEXTROSE 1-5 GM/200ML-% IV SOLN
INTRAVENOUS | Status: AC
  Administered 2020-09-08: 1000 mg via INTRAVENOUS
  Filled 2020-09-08: qty 200

## 2020-09-08 MED ORDER — ROCURONIUM BROMIDE 10 MG/ML (PF) SYRINGE
PREFILLED_SYRINGE | INTRAVENOUS | Status: DC | PRN
  Administered 2020-09-08: 40 mg via INTRAVENOUS

## 2020-09-08 MED ORDER — LIDOCAINE 2% (20 MG/ML) 5 ML SYRINGE
INTRAMUSCULAR | Status: DC | PRN
  Administered 2020-09-08: 40 mg via INTRAVENOUS

## 2020-09-08 MED ORDER — ONDANSETRON HCL 4 MG/2ML IJ SOLN
INTRAMUSCULAR | Status: AC
  Filled 2020-09-08: qty 2

## 2020-09-08 MED ORDER — OXYMETAZOLINE HCL 0.05 % NA SOLN
NASAL | Status: AC
  Filled 2020-09-08: qty 60

## 2020-09-08 MED ORDER — ONDANSETRON HCL 4 MG/2ML IJ SOLN: 4.0000 mg | Freq: Once | INTRAMUSCULAR | Status: DC | PRN

## 2020-09-08 MED ORDER — LIDOCAINE-EPINEPHRINE 1 %-1:100000 IJ SOLN
INTRAMUSCULAR | Status: DC | PRN
  Administered 2020-09-08: 6 mL

## 2020-09-08 MED ORDER — ARTIFICIAL TEARS OPHTHALMIC OINT
TOPICAL_OINTMENT | OPHTHALMIC | Status: DC | PRN
  Administered 2020-09-08: 1 via OPHTHALMIC

## 2020-09-08 MED ORDER — DEXAMETHASONE SODIUM PHOSPHATE 10 MG/ML IJ SOLN
INTRAMUSCULAR | Status: DC | PRN
  Administered 2020-09-08: 4 mg via INTRAVENOUS

## 2020-09-08 MED ORDER — CHLORHEXIDINE GLUCONATE 0.12 % MT SOLN: 15.0000 mL | Freq: Once | OROMUCOSAL | Status: AC

## 2020-09-08 SURGICAL SUPPLY — 32 items
BAG COUNTER SPONGE SURGICOUNT (BAG) ×2 IMPLANT
BAG SPNG CNTER NS LX DISP (BAG) ×1
BLADE ROTATE RAD 40 4 M4 (BLADE) ×2 IMPLANT
BLADE ROTATE TRICUT 4X13 M4 (BLADE) ×2 IMPLANT
CANISTER SUCT 3000ML PPV (MISCELLANEOUS) ×4 IMPLANT
COAGULATOR SUCT SWTCH 10FR 6 (ELECTROSURGICAL) IMPLANT
DRSG NASOPORE 8CM (GAUZE/BANDAGES/DRESSINGS) ×2 IMPLANT
ELECT REM PT RETURN 9FT ADLT (ELECTROSURGICAL) ×2
ELECTRODE REM PT RTRN 9FT ADLT (ELECTROSURGICAL) ×1 IMPLANT
FILTER ARTHROSCOPY CONVERTOR (FILTER) ×2 IMPLANT
GLOVE SURG ENC TEXT LTX SZ7 (GLOVE) ×4 IMPLANT
GOWN STRL REUS W/ TWL LRG LVL3 (GOWN DISPOSABLE) ×2 IMPLANT
GOWN STRL REUS W/TWL LRG LVL3 (GOWN DISPOSABLE) ×4
IV NS 1000ML (IV SOLUTION) ×2
IV NS 1000ML BAXH (IV SOLUTION) ×1 IMPLANT
KIT BASIN OR (CUSTOM PROCEDURE TRAY) ×2 IMPLANT
KIT TURNOVER KIT B (KITS) ×2 IMPLANT
NEEDLE HYPO 25GX1X1/2 BEV (NEEDLE) ×2 IMPLANT
NS IRRIG 1000ML POUR BTL (IV SOLUTION) ×2 IMPLANT
PAD ARMBOARD 7.5X6 YLW CONV (MISCELLANEOUS) ×4 IMPLANT
SPECIMEN JAR SMALL (MISCELLANEOUS) ×2 IMPLANT
SPONGE NEURO XRAY DETECT 1X3 (DISPOSABLE) ×2 IMPLANT
SYR CONTROL 10ML LL (SYRINGE) ×2 IMPLANT
TOWEL GREEN STERILE FF (TOWEL DISPOSABLE) ×2 IMPLANT
TRACKER ENT INSTRUMENT (MISCELLANEOUS) ×2 IMPLANT
TRACKER ENT PATIENT (MISCELLANEOUS) ×2 IMPLANT
TRAY ENT MC OR (CUSTOM PROCEDURE TRAY) ×2 IMPLANT
TUBE CONNECTING 12X1/4 (SUCTIONS) ×2 IMPLANT
TUBING EXTENTION W/L.L. (IV SETS) ×2 IMPLANT
TUBING STRAIGHTSHOT EPS 5PK (TUBING) ×2 IMPLANT
WATER STERILE IRR 1000ML POUR (IV SOLUTION) ×2 IMPLANT
WIPE INSTRUMENT VISIWIPE 73X73 (MISCELLANEOUS) ×2 IMPLANT

## 2020-09-08 NOTE — Op Note (Signed)
Operative Note:  ENDOSCOPIC SINUS SURGERY WITH NAVIGATION       Patient: [name] [name]  Medical record number: [id number]  Date:09/08/2020  Pre-operative Indications: 1.  Chronic Sinusitis     2.  Nasal polyposis  Postoperative Indications: Same  Surgical Procedure: 1.  Bilateral endoscopic sinus surgery with intraoperative computer-assisted navigation consisting of: Endoscopic resection of intranasal polyps, bilateral sphenoidotomy with removal of diseased tissue, right maxillary antrostomy with removal of diseased tissue and left nasal frontal recess exploration.       Anesthesia: GET  Surgeon: Delsa Bern, M.D.  Complications: None  EBL: Less than 100 cc cc  Findings: Intranasal polypoid disease, extensive prior endoscopic sinus surgery.  Polypoid obstruction of the sphenoid sinus ostia, left nasal frontal recess and right maxillary antrum.  The patient had extensive disease in the right maxillary sinus consistent with chronic fungal sinusitis.  Kenalog/Bactroban slurry instilled in the sinuses and Nasa-pore nasal packing placed.   Brief History: The patient is a 87 y.o. female with a history of chronic sinusitis and turbinate hypertrophy. The patient has been on medical therapy to reduce nasal mucosal edema and infection including antibiotics, saline nasal spray and topical nasal steroids. Despite appropriate medical therapy the patient continues to have ongoing symptoms.  Follow-up CT scan after treatment showed opacification of the right maxillary sinus, left frontal sinus obstruction with air-fluid level and bilateral sphenoid sinus mucosal disease.  The patient's primary symptomatic complaint was left frontal headache and a chronic cough.  Given the patient's history and findings, the above surgical procedures were recommended, risks and benefits were discussed in detail with the patient may understand and agree with our plan for surgery which is scheduled at [place] in OR  under general anesthesia as an outpatient.  Surgical Procedure: The patient is brought to the operating room on 09/08/2020 and placed in supine position on the operating table. General endotracheal anesthesia was established without difficulty. When the patient was adequately anesthetized, surgical timeout was performed with correct identification of the patient and the surgical procedure. The patient's nose was then injected with 6 cc of 1% lidocaine 1:100,000 dilution epinephrine which was injected in a submucosal fashion. The patient's nose was then packed with Afrin-soaked cottonoid pledgets were left in place for approximately 10 minutes to allow for vasoconstriction and hemostasis.  The Xomed Fusion navigation headgear was applied in anatomic and surgical landmarks were identified and confirmed, navigation was used throughout the sinus component of the surgical procedure.  With the patient prepped draped and prepared for surgery, nasal nasal endoscopy was performed on bilaterally.  Using a 0 degree endoscope and a straight microdebrider, bilateral nasal polypectomy was performed, patient undergone previous extensive endoscopic sinus surgery with minimal scarring, polypoid disease was obstructing the nasal passageway and ethmoid sinuses bilaterally this was completely resected using the microdebrider creating a widely patent nasal passageway and bilateral common ethmoid passageway.  Endoscopic sinus surgery was then begun on the patient's right-hand side with identification of the natural ostium of the sphenoid sinus, sphenoidotomy was performed using through-cutting forceps and a microdebrider to enlarge the sphenoid sinus ostium within the sinus there is no evidence of significant infection but polypoid material was resected.  Attention was then turned to the lateral nasal wall where the natural opening of the right maxillary sinus was completely occluded with polypoid disease, this was resected with a  curved microdebrider.  The entire right maxillary sinus was filled with fungal appearing debris which was irrigated and completely  cleared.  The maxillary sinus was widely patent and there is no evidence of any additional infection.  The patient's left side was then inspected and a left sphenoidotomy was performed, polypoid material was resected using a straight microdebrider and the natural ostium the sinus was identified, this enlarged in a lateral and inferior direction create a widely patent sphenoid sinus ostium there was polypoid material within the sinus which was also resected with the microdebrider.  No pus or purulent drainage.  The patient's left maxillary sinus was widely patent and there is no evidence of active infection.  Attention then turned to the superior ethmoid cavity and using a 45 degree telescope and a curved microdebrider the nasal frontal recess was gently palpated and explored this was scarred and polypoid material was resected creating widely patent left frontal sinus without evidence of active infection or additional polyps.  At the conclusion of the procedure, a 50-50 mix of Kenalog 40 and Bactroban was instilled in the sinuses.  Nasa-pore nasal packing was placed in the common ethmoid cavity bilaterally.  Surgical sponge count was correct. An oral gastric tube was passed and the stomach contents were aspirated. Patient was awakened from anesthetic and transferred from the operating room to the recovery room in stable condition. There were no complications and blood loss was 100 cc.   Delsa Bern, M.D. [facility] 09/08/2020

## 2020-09-08 NOTE — H&P (Deleted)
  The note originally documented on this encounter has been moved the the encounter in which it belongs.  

## 2020-09-08 NOTE — Transfer of Care (Signed)
Immediate Anesthesia Transfer of Care Note  Patient: [name] [name]  Procedure(s) Performed: Bilateral Revision of Endoscopic Sinus Surgery with Fusion and Intranasal Polypectomy (Bilateral: Nose)  Patient Location: PACU  Anesthesia Type:General  Level of Consciousness: oriented, drowsy and patient cooperative  Airway & Oxygen Therapy: Patient Spontanous Breathing and Patient connected to nasal cannula oxygen  Post-op Assessment: Report given to RN and Post -op Vital signs reviewed and stable  Post vital signs: Reviewed  Last Vitals:  Vitals Value Taken Time  BP 189/89 09/08/20 1102  Temp    Pulse 79 09/08/20 1105  Resp 20 09/08/20 1105  SpO2 98 % 09/08/20 1105  Vitals shown include unvalidated device data.  Last Pain:  Vitals:   09/08/20 0745  TempSrc:   PainSc: 0-No pain         Complications: No notable events documented.

## 2020-09-08 NOTE — H&P (Signed)
[name] [name] is an 87 y.o. female.   Chief Complaint: Chronic sinusitis HPI: Patient with a history of chronic sinusitis and nasal polyposis.  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    Hyperlipidemia    Hypertension    Hypothyroidism    Lipoma    left arm   Nasal polyps    Pneumonia    Pulmonary nodule    noted on pre TAVR CT   Samter's triad    Severe aortic stenosis     Past Surgical History:  Procedure Laterality Date   CHOLECYSTECTOMY  02/27/04   CORONARY ATHERECTOMY N/A 01/05/2019   Procedure: CORONARY ATHERECTOMY;  Surgeon: Adrian Prows, MD;  Location: [place] [facility];  Service: Cardiovascular;  Laterality: N/A;   CORONARY BALLOON ANGIOPLASTY N/A 01/05/2019   Procedure: CORONARY BALLOON ANGIOPLASTY;  Surgeon: Adrian Prows, MD;  Location: [place] [facility];  Service: Cardiovascular;  Laterality: N/A;   CORONARY STENT INTERVENTION N/A 01/05/2019   Procedure: CORONARY STENT INTERVENTION;  Surgeon: Adrian Prows, MD;  Location: [place] [facility];  Service: Cardiovascular;  Laterality: N/A;   CORONARY STENT INTERVENTION  01/05/2019   INCONTINENCE SURGERY  09/12/09   NASAL SINUS SURGERY     several     ORIF ANKLE FRACTURE Right 09/11/2013   Procedure: OPEN REDUCTION INTERNAL FIXATION (ORIF) ANKLE FRACTURE;  Surgeon: Augustin Schooling, MD;  Location: [facility];  Service: Orthopedics;  Laterality: Right;   RIGHT/LEFT HEART CATH AND CORONARY ANGIOGRAPHY N/A 12/15/2018   Procedure: RIGHT/LEFT HEART CATH AND CORONARY ANGIOGRAPHY;  Surgeon: Adrian Prows, MD;  Location: [place] [facility];  Service:  Cardiovascular;  Laterality: N/A;   ROTATOR CUFF REPAIR  09/27/2003   TRANSCATHETER AORTIC VALVE REPLACEMENT, TRANSFEMORAL  01/26/2019   TRANSCATHETER AORTIC VALVE REPLACEMENT, TRANSFEMORAL N/A 01/26/2019   Procedure: TRANSCATHETER AORTIC VALVE REPLACEMENT, TRANSFEMORAL;  Surgeon: Burnell Blanks, MD;  Location: [place];  Service: Open Heart Surgery;  Laterality: N/A;   TRIGGER FINGER RELEASE  10/29/06    Family History  Problem Relation Age of Onset   Tuberculosis Mother    Other Father        Bronchiectasis   Cancer Maternal Grandmother        ovarian   Sarcoidosis Sister    Asthma Maternal Grandfather    Heart disease Brother    Heart attack Brother    Breast cancer Daughter    Brain cancer Daughter    Social History:  reports that she has never smoked. She has never used smokeless tobacco. She reports that she does not drink alcohol and does not use drugs.  Allergies:  Allergies  Allergen Reactions   Aspirin Anaphylaxis   Bee Venom Anaphylaxis   Ceclor [Cefaclor] Anaphylaxis   Pneumococcal Vac Polyvalent     Other reaction(s): swelling and erythema   Statins Other (See Comments)    Elevated liver enzymes   Crestor [Rosuvastatin] Rash   Sulfa Antibiotics Rash    (Not in a hospital admission)   No  results found for this or any previous visit (from the past 36 hour(s)). No results found.  Review of Systems  HENT:  Positive for congestion, sinus pressure and sinus pain.   Respiratory: Negative.     Blood pressure 131/61, pulse 74, temperature (!) 97.5 F (36.4 C), temperature source Oral, resp. rate 19, height 5' (1.524 m), weight 53.1 kg, SpO2 100 %. Physical Exam Constitutional:      Appearance: She is normal weight.  HENT:     Nose:     Comments: Nasal polyposis Cardiovascular:     Rate and Rhythm: Normal rate.  Pulmonary:     Effort: Pulmonary effort is normal.  Musculoskeletal:     Cervical back: Normal range of motion.  Neurological:     Mental  Status: She is alert.     Assessment/Plan Patient admitted for endoscopic sinus surgery with navigation under general anesthesia as an outpatient.  Jerrell Belfast, MD 09/08/2020, 8:57 AM

## 2020-09-08 NOTE — Anesthesia Procedure Notes (Signed)
Procedure Name: Intubation Date/Time: 09/08/2020 9:36 AM Performed by: Jenne Campus, CRNA Pre-anesthesia Checklist: Patient identified, Emergency Drugs available, Suction available and Patient being monitored Patient Re-evaluated:Patient Re-evaluated prior to induction Oxygen Delivery Method: Circle System Utilized Preoxygenation: Pre-oxygenation with 100% oxygen Induction Type: IV induction Ventilation: Mask ventilation without difficulty Laryngoscope Size: 2 Grade View: Grade I Tube type: Oral Tube size: 7.0 mm Number of attempts: 1 Airway Equipment and Method: Stylet and Oral airway Placement Confirmation: ETT inserted through vocal cords under direct vision, positive ETCO2 and breath sounds checked- equal and bilateral Secured at: 22 cm Tube secured with: Tape Dental Injury: Teeth and Oropharynx as per pre-operative assessment

## 2020-09-08 NOTE — Progress Notes (Signed)
Reviewed discharge instructions with patient's daughter Amy. She is adamant patient receive pain medicine prior to patient being discharged, patient states she is not having any pain and has verbally stated this multiple times during her recovery time.   Amy also insisted patient's ambulatory status prior to discharge. Patient was able to ambulate tot he bathroom with standby assistance.   Patient's daughter was not pleasant to the NT who was transporting the patient to the car.

## 2020-09-08 NOTE — Anesthesia Postprocedure Evaluation (Signed)
Anesthesia Post Note  Patient: [name] [name]  Procedure(s) Performed: Bilateral Revision of Endoscopic Sinus Surgery with Fusion and Intranasal Polypectomy (Bilateral: Nose)     Patient location during evaluation: PACU Anesthesia Type: General Level of consciousness: awake and alert Pain management: pain level controlled Vital Signs Assessment: post-procedure vital signs reviewed and stable Respiratory status: spontaneous breathing, nonlabored ventilation and respiratory function stable Cardiovascular status: stable and blood pressure returned to baseline Anesthetic complications: no   No notable events documented.  Last Vitals:  Vitals:   09/08/20 1115 09/08/20 1130  BP: (!) 169/82 (!) 176/91  Pulse: 75 68  Resp: 15 10  Temp:  (!) 36.2 C  SpO2: 99% 96%    Last Pain:  Vitals:   09/08/20 1130  TempSrc:   PainSc: 0-No pain                 Audry Pili

## 2020-09-09 ENCOUNTER — Encounter (HOSPITAL_COMMUNITY): Payer: Self-pay | Admitting: Otolaryngology

## 2020-09-10 ENCOUNTER — Other Ambulatory Visit: Payer: Self-pay | Admitting: Student

## 2020-09-10 DIAGNOSIS — I5033 Acute on chronic diastolic (congestive) heart failure: Secondary | ICD-10-CM

## 2020-09-11 ENCOUNTER — Telehealth: Payer: Self-pay | Admitting: Cardiology

## 2020-09-11 NOTE — Telephone Encounter (Signed)
Keep an eye for now and lets see how she progresses. I sent mychart message

## 2020-09-11 NOTE — Telephone Encounter (Signed)
Pt's daughter calling about Pt's symptoms post surgery; says Pt was having low bp around (90/60) & low heart rate (56 bpm). Pt had sinus surgery on Fri 8/19. Pt's daughter wanted to know if this is normal or if she should get checked out. Pts BP this morning was 124/73 and heart rate was 73. Patient stated she believes her BP and HR are coming back up. Please advise.

## 2020-09-11 NOTE — Telephone Encounter (Signed)
Pt's daughter calling about Pt's symptoms post surgery; says Pt is having low bp & low heart rate (56 bpm). Pt had sinus surgery on Fri 8/19. Pt's daughter wanted to know if this is normal or if she should get checked out.

## 2020-09-12 LAB — SURGICAL PATHOLOGY

## 2020-09-12 NOTE — Telephone Encounter (Signed)
Called and spoke to pt, pt voiced understanding and had already looked at the message on my chart.

## 2020-09-13 LAB — AEROBIC/ANAEROBIC CULTURE W GRAM STAIN (SURGICAL/DEEP WOUND): Gram Stain: NONE SEEN

## 2020-09-14 ENCOUNTER — Telehealth: Payer: Self-pay | Admitting: Pulmonary Disease

## 2020-09-14 ENCOUNTER — Telehealth: Payer: Self-pay | Admitting: Cardiology

## 2020-09-14 DIAGNOSIS — R945 Abnormal results of liver function studies: Secondary | ICD-10-CM

## 2020-09-14 DIAGNOSIS — I10 Essential (primary) hypertension: Secondary | ICD-10-CM

## 2020-09-14 DIAGNOSIS — N1832 Chronic kidney disease, stage 3b: Secondary | ICD-10-CM

## 2020-09-14 DIAGNOSIS — R7989 Other specified abnormal findings of blood chemistry: Secondary | ICD-10-CM

## 2020-09-14 NOTE — Telephone Encounter (Signed)
Pt's daughter called concerned w/pt's culture report post-op, would like for med assistant to look at culture report from surgery if you can access it, as well as post-op meds. Pt's daughter Amy would like a call back to discuss w/you. [phone].

## 2020-09-14 NOTE — Telephone Encounter (Signed)
ATC Amy patient's daughter, [facility]

## 2020-09-14 NOTE — Telephone Encounter (Signed)
Dr. Loanne Drilling is not available today , she will need to discuss with the surgeon the test results and treatment options  Chest xray done 09/05/20 did not show any spots on the lung .  Can make her an ov for further evaluation  Last seen by Volanda Napoleon NP and Loanne Drilling , will send to them for FYI  Please contact office for sooner follow up if symptoms do not improve or worsen or seek emergency care

## 2020-09-14 NOTE — Telephone Encounter (Signed)
I called and spoke with daughter regarding TP recs. She verbalized understanding and wants to make an OV with Dr. Loanne Drilling next available. She is concerned that fungus from sinuses may have to do with spots on lungs and wants to see if she needs an ABX for that. Dr. Loanne Drilling Is booked until 10/20/20 but has some held spots.  Dr. Loanne Drilling, are you ok using held spot on 10/02/20 at 12pm to see patient? Thanks!

## 2020-09-14 NOTE — Telephone Encounter (Signed)
Ok to use 10 am spots on either 9/12 or 9/13. Also ok to use 3pm on 9/14.  Please contact patient with the below information:  Based on her prior CT in 12/2019, these lung spots are NOT concerning for infection, neither bacterial or fungal, and have been stable in size since 2020 which suggests they are benign. No antibiotics are needed. Respiratory fungal infections are not contagious and did not warrant masking.

## 2020-09-14 NOTE — Telephone Encounter (Signed)
Call returned to Amy [facility]), confirmed DOB. Patient daughter reports patient recently had a sinus surgery. She had several polyps removed. Daughter reports they received the path report and the patient has some pseudomonas and fungus in her sinus. She has since been placed on 5 days of amoxicillin due to the pocket of fungus that was found in her sinus. Daughter reports the patient feels bad but is unable to state what the patient is feeling. Patient was placed on phone, when asked what symptoms she was having patient states I just feel bad. I asked whether they had reached out to the doctor that did the surgery and they have. Daughter states she is wanting to know whether these spots on her lungs could also be fungus as well. She is also wanting to know whether her mother's fungus is contagious and does she need to wear a mask. I made her aware it sounds like most of these questions might be better suited for the surgeon to answer however I would send the message. Daughter states this is concerning her lungs, he can't  help with that. Daughter is requesting further testing to ensure the spots on her lungs are not fungus related as well. Denies any increased SOB, fever, chest discomfort, congestion, or body aches.   TP please advise. :)

## 2020-09-14 NOTE — Telephone Encounter (Signed)
Pt's daughter called concerned w/pt's culture report post-op, would like for med assistant to look at culture report from surgery if you can access it, as well as post-op meds. Pt's daughter Amy would like a call back to discuss w/you. [phone].

## 2020-09-15 NOTE — Telephone Encounter (Signed)
Pt's daughter called concerned w/pt's culture report post-op, would like for med assistant to look at culture report from surgery if you can access it, as well as post-op meds. Pt's daughter Amy would like a call back to discuss w/you. [phone].

## 2020-09-15 NOTE — Telephone Encounter (Signed)
Called and spoke with pt and her daughter I have scheduled pt for 10/02/20 at 10 am ok per JE  The appt that was pending for 11/15/20 was cancelled and they are also aware of this  She was also informed of the results as stated below per JE  Nothing further needed

## 2020-09-15 NOTE — Telephone Encounter (Signed)
Spoke to her and reassured her. Renal function is now back to baseline.   Abnormal LFT stop statins.   Reviewed the culture report from the labs, no obvious pathogenic organism that she needs to be treated with antibiotics and reviewed the pulmonary notes as well with regard to pulmonary nodules.  Advised her to discontinue statins for now until LFTs become normal, patient is presently 87 years of age, we could certainly try very low-dose of a statin.  She is also being evaluated by Dr. Michail Sermon from GI standpoint.  Primary hypertension  Stage 3b chronic kidney disease (HCC)  Abnormal LFTs  I spent a total of 10 minutes in review of the charts, additional 8 to 10 minutes on the phone with the patient's daughter and patient.

## 2020-09-15 NOTE — Assessment & Plan Note (Signed)
-   Stable interval  - Needs Pre-op CXR today  - Continue Spiriva respimat 2.64mg two puffs once daily - Follow-up 2-3 months with Dr. ELoanne Drilling

## 2020-09-15 NOTE — Assessment & Plan Note (Signed)
Patient will be considered low-intermediate risk for prolonged mechanical ventilator and/or post op pneumonia/pulmonary complications. Ultimate clearance is decided by surgeon and anesthesiology   Recommendations: - Stop Montelukast d.t elevated liver enzymes which can be seen in 1-2% population. Discuss restarting after surgery with Dr. Wilburn [name]  - Encourage early ambulation, DVT prophylaxis, continue bronchodilators and use incentive spirometer

## 2020-09-18 ENCOUNTER — Telehealth: Payer: Self-pay | Admitting: *Deleted

## 2020-09-18 ENCOUNTER — Telehealth: Payer: Self-pay | Admitting: Physician Assistant

## 2020-09-18 DIAGNOSIS — D508 Other iron deficiency anemias: Secondary | ICD-10-CM

## 2020-09-18 DIAGNOSIS — D649 Anemia, unspecified: Secondary | ICD-10-CM

## 2020-09-18 NOTE — Telephone Encounter (Signed)
I spoke to Ms. Tereso Newcomer and her daughter regarding patient's hair loss. They inquired if it is due to patient's iron deficiency. I explained that iron deficiency can cause hair loss but I will need to repeat iron levels before I can confirm that is the underlying cause. Patient is currently on ferrous sulfate 325 mg once daily. She will return for labs this Wednesday at 1pm to repeat iron panel. I will call patient with the results once available. Patient and her daughter expressed understanding and satisfaction with the plan provided.

## 2020-09-18 NOTE — Telephone Encounter (Signed)
Patient called to ask a question.  Reports she is taking a multivitamin daily that has 19 mg of Iron in it.  She was also prescribed FE '325mg'$ .  Needs to know if she should take both or eliminate one of them.   Routed to [place] to please advise.

## 2020-09-19 ENCOUNTER — Telehealth: Payer: Self-pay | Admitting: Dietician

## 2020-09-19 ENCOUNTER — Other Ambulatory Visit: Payer: Self-pay | Admitting: Physician Assistant

## 2020-09-19 LAB — BASIC METABOLIC PANEL
BUN/Creatinine Ratio: 14 (ref 12–28)
BUN: 17 mg/dL (ref 8–27)
CO2: 22 mmol/L (ref 20–29)
Calcium: 8.8 mg/dL (ref 8.7–10.3)
Chloride: 96 mmol/L (ref 96–106)
Creatinine, Ser: 1.22 mg/dL — ABNORMAL HIGH (ref 0.57–1.00)
Glucose: 153 mg/dL — ABNORMAL HIGH (ref 65–99)
Potassium: 4.3 mmol/L (ref 3.5–5.2)
Sodium: 132 mmol/L — ABNORMAL LOW (ref 134–144)
eGFR: 43 mL/min/{1.73_m2} — ABNORMAL LOW (ref >59)

## 2020-09-19 NOTE — Telephone Encounter (Signed)
Returned daughter's call.  Amy, NP  Patient and daughter have questions regarding the supplements that patient is to be taking. Patient had sinus surgery on 09/08/2020.  She had gone off her medications and supplements due to this surgery.  This included Lasix.  Daughter noted improvement of renal function based on labs.  eGFR 43 09/09/2020, 28 09/01/2020, 47 05/06/2020.  Other labs reviewed as well.  Discussed possible reasons including improved hydration and off lasix and probably no correlation to her MVI and other nutrition supplements which were also stopped.  Referred Amy to [email or web address] web site which has several articles regarding supplementation with CKD.  Calcium could be continued.  Vitamin D could be continued.  Iron per MD.  Vitamin B supplements can also be continued and may wish to choose a Methyle folate, vitamin B-12 supplement option.  Patient reports improvement of her hair since starting on the Biotin but MD also noted the hair loss could have been attributed to low iron.  Patient has a follow up with myself on 10/31.  They are to call for any question.  Antonieta Iba, RD, LDN, CDCES

## 2020-09-20 ENCOUNTER — Other Ambulatory Visit: Payer: Self-pay

## 2020-09-20 ENCOUNTER — Inpatient Hospital Stay: Payer: Federal, State, Local not specified - PPO | Attending: Physician Assistant

## 2020-09-20 ENCOUNTER — Telehealth: Payer: Self-pay | Admitting: Physician Assistant

## 2020-09-20 DIAGNOSIS — D508 Other iron deficiency anemias: Secondary | ICD-10-CM

## 2020-09-20 DIAGNOSIS — D509 Iron deficiency anemia, unspecified: Secondary | ICD-10-CM | POA: Diagnosis present

## 2020-09-20 LAB — IRON AND TIBC
Iron: 73 ug/dL (ref 41–142)
Saturation Ratios: 22 % (ref 21–57)
TIBC: 334 ug/dL (ref 236–444)
UIBC: 260 ug/dL (ref 120–384)

## 2020-09-20 LAB — RETIC PANEL
Immature Retic Fract: 8.2 % (ref 2.3–15.9)
RBC.: 3.06 MIL/uL — ABNORMAL LOW (ref 3.87–5.11)
Retic Count, Absolute: 93.6 10*3/uL (ref 19.0–186.0)
Retic Ct Pct: 3.1 % (ref 0.4–3.1)
Reticulocyte Hemoglobin: 35.3 pg (ref >27.9)

## 2020-09-20 LAB — FERRITIN: Ferritin: 66 ng/mL (ref 11–307)

## 2020-09-20 NOTE — Telephone Encounter (Signed)
I called [name] [name] and her daughter to review labs from today. Ferritin is normal. Iron saturation has improved from 19% to 22%. Recommend to continue ferrous sulfate 325 mg once daily. Regarding hair loss, I suggested patient follow up with PCP to rule out other causes. Patient and her daughter expressed understanding and satisfaction with the plan provided.

## 2020-10-02 ENCOUNTER — Encounter: Payer: Self-pay | Admitting: Pulmonary Disease

## 2020-10-02 ENCOUNTER — Ambulatory Visit: Payer: Federal, State, Local not specified - PPO | Admitting: Pulmonary Disease

## 2020-10-02 ENCOUNTER — Other Ambulatory Visit: Payer: Self-pay

## 2020-10-02 VITALS — BP 116/68 | HR 72 | Temp 97.4°F | Ht 60.0 in | Wt 111.8 lb

## 2020-10-02 DIAGNOSIS — J432 Centrilobular emphysema: Secondary | ICD-10-CM | POA: Diagnosis not present

## 2020-10-02 MED ORDER — SPIRIVA RESPIMAT 2.5 MCG/ACT IN AERS: 2.0000 | INHALATION_SPRAY | Freq: Every day | RESPIRATORY_TRACT | 5 refills | Status: DC

## 2020-10-02 NOTE — Patient Instructions (Addendum)
Asthma/Emphysema --CONTINUE Spiriva 2.5 mcg TWO puffs ONCE a day. REFILL --CONTINUE Albuterol as needed for shortness of breath or wheezing. REFILL --CONTINUE Singulair 10 mg daily. REFILL --Discussed vaccination including timing recommendations   OK for covid booster now  OK for influenza   If we decide to step up in inhaler therapy - I would like to recommend Stiolto Respimat. If this is not approved, Anoro would be my next recommendation  Follow-up with me in December after 12/25/20 after your CT scan

## 2020-10-02 NOTE — Progress Notes (Signed)
Subjective:   PATIENT ID: [name] [name] GENDER: female DOB: 06-16-1931, MRN: [id number]   HPI  Chief Complaint  Patient presents with   Follow-up    Sob last night took fluid pill    Reason for Visit: Follow-up  [name] is a 87 year old female never smoker with emphysema, CAD s/p stent, AVS s/pt TAVR 01/2019, pulmonary nodules who presents for follow-up.  Synopsis:  She was referred to Pulmonary after being discharged from the hospital in 04/2020. Per review of discharge note she required 1-2L O2 in the ED. V/Q scan was negative for PE. While inpatient she was diuresed. Cardiology was consulted and plan for outpatient nuclear stress test. She was also referred to Pulmonary to consider PFT for evaluation of her emphysema. At time of discharge she did not require any oxygen. She has been on lasix and has noticed weight gain of 10lbs post-discharge. This morning is 116lb, baseline is 113-115lb.  She reports childhood asthma and respiratory complications during pregnancy in her 20-30s. She was on a nebulizer 2-3 x daily in her 50-60s and needing steroids at least twice a year. However in her 70s-80s she has not had any issues or needing any medications for her asthma and this may have been related from removing environmental issues (older home, dust, tobacco farms, gardening)  She has had multiple sinus surgery x 4 including for nasal polyp removal. She was seen by ENT yesterday. She takes zyrtec and saline rinses. She is scheduled for stress test next week with [facility].  05/18/20 Since our last visit, she has been tolerating Spiriva and feels that it is improving her shortness of breath. Her daughter is present with her daughter and wish to review PFT results.  10/02/20 Since our last visit she was seen by ENT and underwent sinus surgery (sinus fusion and intranasal polypectomy) in Aug 2022 with path returning with pseudomonas and fungus. She was treated with a course  of amoxicillin. Last ENT visit was 09/29/20 and started on nasal rinses and sprays. Not sure if this is effective yet since she recently started. She is on diuretics which she manages diligently with daily weight checks. She has been referred to a Nephrology. She tolerating the Spiriva and feels it is helping her. Uses albuterol on average once a week for shortness of breath.    Social History: Never smoker Her mother was diagnosed with tuberculosis Wood burning stove  Past Medical History:  Diagnosis Date   Arthritis    Asthma    history of   CAD (coronary artery disease)    Cirrhosis ([place])    CKD (chronic kidney disease)    Dyspnea    sometimes sitting.lying and on exertion   Emphysema lung ([facility])    GERD (gastroesophageal reflux disease)    History of hiatal hernia    History of transcatheter aortic valve replacement (TAVR) 01/26/2019   (TAVR  23 mm Evolute Pro in aortic position 01/26/2019) .    Hyperlipidemia    Hypertension    Hypothyroidism    Lipoma    left arm   Nasal polyps    Pneumonia    Pulmonary nodule    noted on pre TAVR CT   Samter's triad    Severe aortic stenosis      Allergies  Allergen Reactions   Aspirin Anaphylaxis   Bee Venom Anaphylaxis   Ceclor [Cefaclor] Anaphylaxis   Pneumococcal Vac Polyvalent     Other reaction(s): swelling and  erythema   Statins Other (See Comments)    Elevated liver enzymes   Crestor [Rosuvastatin] Rash   Sulfa Antibiotics Rash     Outpatient Medications Prior to Visit  Medication Sig Dispense Refill   albuterol (VENTOLIN HFA) 108 (90 Base) MCG/ACT inhaler Inhale 2 puffs into the lungs every 6 (six) hours as needed for shortness of breath (use as needed or before activity for shortness of breath or whezzing). 8 g 6   Biotin 5 MG CAPS Take 500 mg by mouth daily.     calcium-vitamin D (OSCAL WITH D) 250-125 MG-UNIT tablet Take 1 tablet by mouth daily.     diphenhydrAMINE (BENADRYL) 25 mg capsule Take 25 mg by mouth every  6 (six) hours as needed.     diphenhydrAMINE-zinc acetate (BENADRYL ITCH STOPPING) cream Apply 1 each topically 3 (three) times daily as needed for itching. 3 times daily.     ferrous sulfate 325 (65 FE) MG EC tablet Take 1 tablet (325 mg total) by mouth daily with breakfast. 30 tablet 3   fluticasone (FLONASE) 50 MCG/ACT nasal spray Place into both nostrils daily.     furosemide (LASIX) 20 MG tablet PLEASE SEE ATTACHED FOR DETAILED DIRECTIONS 180 tablet 0   hydrocortisone cream 1 % Apply 1 application topically 3 (three) times daily.     levothyroxine (SYNTHROID) 50 MCG tablet Take 50 mcg by mouth daily before breakfast.     meclizine (ANTIVERT) 25 MG tablet Take 25 mg by mouth 3 (three) times daily as needed for dizziness.     montelukast (SINGULAIR) 10 MG tablet Take 1 tablet (10 mg total) by mouth daily. (Patient taking differently: Take 10 mg by mouth at bedtime.) 30 tablet 6   Multiple Vitamin (MULTIVITAMIN WITH MINERALS) TABS tablet Take 1 tablet by mouth daily.     Peak Flow Meter DEVI 1 puff by Does not apply route in the morning and at bedtime. 1 each 0   Polyethyl Glycol-Propyl Glycol (SYSTANE) 0.4-0.3 % SOLN Place 1 drop into both eyes 3 (three) times daily as needed (dry/irritated eyes.).     potassium chloride (KLOR-CON) 10 MEQ tablet Take 1 tablet (10 mEq total) by mouth daily. 90 tablet 3   propranolol (INDERAL) 20 MG tablet Take 1 tablet (20 mg total) by mouth 3 (three) times daily. 90 tablet 3   SALINE NASAL SPRAY NA Place 1 spray into the nose daily as needed (congestion).     Tiotropium Bromide Monohydrate (SPIRIVA RESPIMAT) 2.5 MCG/ACT AERS Inhale 2 puffs into the lungs daily. 4 g 0   TURMERIC PO Take 538 mg by mouth daily.     No facility-administered medications prior to visit.    Review of Systems  Constitutional:  Negative for chills, diaphoresis, fever, malaise/fatigue and weight loss.  HENT:  Negative for congestion.   Respiratory:  Positive for shortness of breath.  Negative for cough, hemoptysis, sputum production and wheezing.   Cardiovascular:  Negative for chest pain, palpitations and leg swelling.    Objective:   Vitals:   10/02/20 1012  BP: 116/68  Pulse: 72  Temp: (!) 97.4 F (36.3 C)  TempSrc: Oral  SpO2: 98%  Weight: 111 lb 12.8 oz (50.7 kg)  Height: 5' (1.524 m)   SpO2: 98 % O2 Device: None (Room air)  Physical Exam: General: Well-appearing, no acute distress HENT: [place], AT Eyes: EOMI, no scleral icterus Respiratory: Diminished air entry. Clear to auscultation bilaterally.  No crackles, wheezing or rales Cardiovascular: RRR, -M/R/G, no  JVD Extremities:-Edema,-tenderness Neuro: AAO x4, CNII-XII grossly intact Psych: Normal mood, normal affect   Data Reviewed:  Imaging: CT Chest 12/23/19 - S/p SVR. Tortuous thoracic aorta. Mild centrilobular emphysema. Large hiatal hernia. Stable pulmonary nodules including RML 7x5 mm and LLL ~4.5 mm, unchanged from prior imaging  PFT: 05/05/20 FVC 1.7 (91%) FEV1 1.26 (93%) Ratio 73  TLC 84% DLCO 43% Interpretation:  Normal spirometry. No obstructive or restrictive defect  Labs: WBC 05/04/20 9.9 Absolute eos 05/04/20 200  Imaging, labs and test noted above have been reviewed independently by me.    Assessment & Plan:   Discussion: 87 year old female with asthma, emphysema on CT, hx of TB exposure who presents for follow-up. Improved symptoms on Spiriva.  Asthma/Emphysema - well controlled --CONTINUE Spiriva 2.5 mcg TWO puffs ONCE a day --CONTINUE Albuterol as needed for shortness of breath or wheezing --CONTINUE Singulair 10 mg daily --Discussed vaccination including timing recommendations   OK for covid booster now  OK for influenza   If we decide to step up in inhaler therapy - I would like to recommend Stiolto Respimat. If this is not approved, Anoro would be my next recommendation  History of TB exposure --QF-TB neg  Health Maintenance Immunization History  Administered  Date(s) Administered   Influenza Whole 10/27/2015   PFIZER(Purple Top)SARS-COV-2 Vaccination 02/25/2019, 03/24/2019, 11/08/2019   Pneumococcal Polysaccharide-23 01/21/2006   Tdap 10/27/2015   CT Lung Screen - not indicated  No orders of the defined types were placed in this encounter.  Meds ordered this encounter  Medications   Tiotropium Bromide Monohydrate (SPIRIVA RESPIMAT) 2.5 MCG/ACT AERS    Sig: Inhale 2 puffs into the lungs daily.    Dispense:  4 g    Refill:  5    Order Specific Question:   Lot Number?    Answer:   [id number]     Return in about 3 months (around 01/01/2021).  I have spent a total time of 32-minutes on the day of the appointment reviewing prior documentation, coordinating care and discussing medical diagnosis and plan with the patient/family. Past medical history, allergies, medications were reviewed. Pertinent imaging, labs and tests included in this note have been reviewed and interpreted independently by me.  [place], MD [facility] 10/02/2020 10:19 AM  Office Number [phone]

## 2020-10-06 LAB — FUNGUS CULTURE WITH STAIN

## 2020-10-06 LAB — FUNGUS CULTURE RESULT

## 2020-10-06 LAB — FUNGAL ORGANISM REFLEX

## 2020-10-11 NOTE — Progress Notes (Signed)
External labs 10/04/2020: Total cholesterol 250, triglycerides 106, HDL 179, LDL 160

## 2020-10-12 ENCOUNTER — Other Ambulatory Visit: Payer: Self-pay | Admitting: Nephrology

## 2020-10-12 DIAGNOSIS — N1832 Chronic kidney disease, stage 3b: Secondary | ICD-10-CM

## 2020-10-16 ENCOUNTER — Ambulatory Visit
Admission: RE | Admit: 2020-10-16 | Discharge: 2020-10-16 | Disposition: A | Discharge status: Home / Self Care | Payer: Federal, State, Local not specified - PPO | Source: Ambulatory Visit | Attending: Nephrology | Admitting: Nephrology

## 2020-10-16 DIAGNOSIS — N1832 Chronic kidney disease, stage 3b: Secondary | ICD-10-CM

## 2020-10-17 NOTE — Progress Notes (Signed)
Primary Physician/Referring:  Kelton Pillar, MD  Patient ID: [name] [name], female    DOB: 1931/02/27, 87 y.o.   MRN: [id number]  Chief Complaint  Patient presents with   Coronary Artery Disease   HPI:    [name] [name]  is a 87 y.o. Caucasian female with history of benign positional vertigo, chronic dyspnea, chronic back pain, hypertension, mixed hyperlipidemia and  symptomatic aortic stenosis, CAD S/P PCI to LAD/D1  on 01/05/2019. Underwent elective TAVR on 01/26/2019, mild to moderate paravalvular leak. She has aspirin anaphylaxis hence only on Plavix.  Patient presents for follow-up.  Since last office visit patient's LFTs have been elevated, she was therefore advised by Dr. Einar Gip to discontinue statin therapy at this time.  She continues to follow with nephrology, GI, and hematology.  She is recovering well from recent sinus surgery and has upcoming follow-up with ENT.  Overall patient states dyspnea remains stable with use of Spiriva inhaler, she rarely requires albuterol inhaler.   Patient saw nephrology recently who will continue to follow for stable stage IIIb CKD, and agreed with as needed Lasix dosing.  Patient denies chest pain, syncope, near syncope, palpitations, leg swelling, orthopnea, PND.  Past Medical History:  Diagnosis Date   Arthritis    Asthma    history of   CAD (coronary artery disease)    Cirrhosis ([place])    CKD (chronic kidney disease)    Dyspnea    sometimes sitting.lying and on exertion   Emphysema lung (HCC)    GERD (gastroesophageal reflux disease)    History of hiatal hernia    History of transcatheter aortic valve replacement (TAVR) 01/26/2019   (TAVR  23 mm Evolute Pro in aortic position 01/26/2019) .    Hyperlipidemia    Hypertension    Hypothyroidism    Lipoma    left arm   Nasal polyps    Pneumonia    Pulmonary nodule    noted on pre TAVR CT   Samter's triad    Severe aortic stenosis    Past Surgical History:  Procedure Laterality  Date   CHOLECYSTECTOMY  02/27/04   CORONARY ATHERECTOMY N/A 01/05/2019   Procedure: CORONARY ATHERECTOMY;  Surgeon: Adrian Prows, MD;  Location: [place] [place] [facility];  Service: Cardiovascular;  Laterality: N/A;   CORONARY BALLOON ANGIOPLASTY N/A 01/05/2019   Procedure: CORONARY BALLOON ANGIOPLASTY;  Surgeon: Adrian Prows, MD;  Location: [place] [facility];  Service: Cardiovascular;  Laterality: N/A;   CORONARY STENT INTERVENTION N/A 01/05/2019   Procedure: CORONARY STENT INTERVENTION;  Surgeon: Adrian Prows, MD;  Location: [place] [facility];  Service: Cardiovascular;  Laterality: N/A;   CORONARY STENT INTERVENTION  01/05/2019   INCONTINENCE SURGERY  09/12/09   NASAL SINUS SURGERY     several     ORIF ANKLE FRACTURE Right 09/11/2013   Procedure: OPEN REDUCTION INTERNAL FIXATION (ORIF) ANKLE FRACTURE;  Surgeon: Augustin Schooling, MD;  Location: [facility];  Service: Orthopedics;  Laterality: Right;   RIGHT/LEFT HEART CATH AND CORONARY ANGIOGRAPHY N/A 12/15/2018   Procedure: RIGHT/LEFT HEART CATH AND CORONARY ANGIOGRAPHY;  Surgeon: Adrian Prows, MD;  Location: [place] [facility];  Service: Cardiovascular;  Laterality: N/A;   ROTATOR CUFF REPAIR  09/27/2003   SINUS ENDO WITH FUSION Bilateral 09/08/2020   Procedure: Bilateral Revision of Endoscopic Sinus Surgery with Fusion and Intranasal Polypectomy;  Surgeon: Jerrell Belfast, MD;  Location: [facility];  Service: ENT;  Laterality: Bilateral;   TRANSCATHETER AORTIC VALVE REPLACEMENT, TRANSFEMORAL  01/26/2019   TRANSCATHETER AORTIC VALVE REPLACEMENT, TRANSFEMORAL N/A 01/26/2019   Procedure: TRANSCATHETER AORTIC VALVE REPLACEMENT, TRANSFEMORAL;  Surgeon: Burnell Blanks, MD;  Location: [place];  Service: Open Heart Surgery;  Laterality: N/A;   TRIGGER FINGER RELEASE  10/29/06   Family History  Problem Relation Age of Onset   Tuberculosis Mother    Other Father        Bronchiectasis   Cancer Maternal Grandmother        ovarian   Sarcoidosis Sister    Asthma  Maternal Grandfather    Heart disease Brother    Heart attack Brother    Breast cancer Daughter    Brain cancer Daughter    Social History   Tobacco Use   Smoking status: Never   Smokeless tobacco: Never  Substance Use Topics   Alcohol use: No    Marital Status: Married  ROS  Review of Systems  Constitutional: Negative for malaise/fatigue and weight gain.  Cardiovascular:  Positive for dyspnea on exertion (stable). Negative for chest pain, claudication, leg swelling, near-syncope, orthopnea, palpitations, paroxysmal nocturnal dyspnea and syncope.  Respiratory:  Positive for wheezing (occasional and chronic, stable).   Hematologic/Lymphatic: Does not bruise/bleed easily.  Musculoskeletal:  Positive for arthritis, back pain and joint pain.  Gastrointestinal:  Negative for melena.  Neurological:  Negative for dizziness and weakness.  Objective  Blood pressure 123/75, pulse 80, temperature 97.8 F (36.6 C), height 5' (1.524 m), weight 118 lb (53.5 kg), SpO2 98 %. Body mass index is 23.05 kg/m.   Vitals with BMI 10/23/2020 10/02/2020 09/08/2020  Height 5' 0" 5' 0" -  Weight 118 lbs 111 lbs 13 oz -  BMI 69.62 95.28 -  Systolic 413 244 010  Diastolic 75 68 91  Pulse 80 72 68    Physical Exam Vitals reviewed.  Constitutional:      General: She is not in acute distress.    Appearance: She is well-developed.  Neck:     Vascular: No JVD.  Cardiovascular:     Rate and Rhythm: Normal rate and regular rhythm.     Pulses: Intact distal pulses.          Carotid pulses are  on the right side with bruit and  on the left side with bruit.      Popliteal pulses are 2+ on the right side and 2+ on the left side.       Dorsalis pedis pulses are 2+ on the right side and 2+ on the left side.       Posterior tibial pulses are 2+ on the right side and 2+ on the left side.     Heart sounds: S1 normal and S2 normal. Murmur heard.  Harsh midsystolic murmur is present with a grade of 3/6 at the  upper right sternal border radiating to the neck.    No gallop.  Pulmonary:     Effort: Pulmonary effort is normal. No respiratory distress.     Breath sounds: Normal breath sounds. No wheezing, rhonchi or rales.  Musculoskeletal:     Right lower leg: No edema.     Left lower leg: No edema.  Neurological:     Mental Status: She is alert.   Laboratory examination:   Recent Labs    11/30/19 1007 05/04/20 1724 08/04/20 1346 08/16/20 1146 09/01/20 1139 09/07/20 0942 09/18/20 0959  NA 138   < > 135   < > 135 134* 132*  K 4.5   < >  4.2   < > 4.2 4.0 4.3  CL 101   < > 95*   < > 97* 100 96  CO2 24   < > 30   < > _0 GLUCOSE 104*   < > 143*   < > 150* 152* 153*  BUN 18   < > 21   < > 56* 22 17  CREATININE 1.36*   < > 1.78*   < > 1.76* 1.27* 1.22*  CALCIUM 9.7   < > 10.2   < > 9.3 9.2 8.8  GFRNONAA 35*   < > 27*  --  28* 41*  --   GFRAA 41*  --   --   --   --   --   --    < > = values in this interval not displayed.   CMP Latest Ref Rng & Units 09/18/2020 09/07/2020 09/01/2020  Glucose 65 - 99 mg/dL 153(H) 152(H) 150(H)  BUN 8 - 27 mg/dL 17 22 56(H)  Creatinine 0.57 - 1.00 mg/dL 1.22(H) 1.27(H) 1.76(H)  Sodium 134 - 144 mmol/L 132(L) 134(L) 135  Potassium 3.5 - 5.2 mmol/L 4.3 4.0 4.2  Chloride 96 - 106 mmol/L 96 100 97(L)  CO2 20 - 29 mmol/L _1 Calcium 8.7 - 10.3 mg/dL 8.8 9.2 9.3  Total Protein 6.5 - 8.1 g/dL - 6.7 7.5  Total Bilirubin 0.3 - 1.2 mg/dL - 1.1 0.7  Alkaline Phos 38 - 126 U/L - 319(H) 338(H)  AST 15 - 41 U/L - 104(H) 151(H)  ALT 0 - 44 U/L - 133(H) 125(H)   CBC Latest Ref Rng & Units 09/07/2020 09/01/2020 08/04/2020  WBC 4.0 - 10.5 K/uL 8.2 13.9(H) 9.1  Hemoglobin 12.0 - 15.0 g/dL 11.7(L) 12.0 11.1(L)  Hematocrit 36.0 - 46.0 % 36.6 37.3 34.6(L)  Platelets 150 - 400 K/uL 244 288 261   Lipid Panel Recent Labs    11/30/19 1007  CHOL 163  TRIG 83  LDLCALC 85  HDL 62    HEMOGLOBIN A1C Lab Results  Component Value Date   HGBA1C 4.9 01/21/2019    MPG 93.93 01/21/2019   TSH No results for input(s): TSH in the last 8760 hours.  External Labs:  10/09/2020: Hgb 11.0, HCT 33.1, MCV 92, platelet 293 BUN 16, creatinine 1.2, GFR 44, sodium 133, potassium four.2  02/15/2020: LFTs normal, serum glucose 113 mg, BUN 25, creatinine 1.27, EGFR 40 mL, sodium 137, potassium 3.7.  Alkaline phosphatase 518.  Hb 12.3/HCT 35.9, platelets 244.  Cholesterol, total 212.000 09/21/2019 HDL 65.000 09/21/2019 LDL-C 130.000 09/21/2019 Triglycerides 97.000 09/21/2019  A1C 4.900 01/21/2019 TSH 2.070 09/21/2019  Hemoglobin 12.300 09/21/2019  Creatinine, Serum 1.260 09/21/2019 Potassium 4.800 09/21/2019 Magnesium N/D ALT (SGPT) 59.000 09/21/2019  Hemoglobin 10.400 03/22/2019; INR 1.100 01/21/2019 Platelets 287.000 02/23/2019  Creatinine, Serum 1.250 03/22/2019 Potassium 5.100 03/22/2019 ALT (SGPT) 47.000 03/22/2019  TSH 3.200 03/22/2019  07/25/2017: Hemoglobin A1c 9%. Creatinine 1.16, EGFR 54, potassium 4.5, alkaline loss 420, AST 55, CMP otherwise normal. ( PCP has held Zetia and simvastatin for 2 months due to elevated LFTs). Hemoglobin 11.8.   Cholesterol 157, triglycerides 105, HDL 60, LDL 78.  05/27/2017: Cholesterol 208, triglycerides 84, HDL 81, LDL 110.  Allergies   Allergies  Allergen Reactions   Aspirin Anaphylaxis   Bee Venom Anaphylaxis   Ceclor [Cefaclor] Anaphylaxis   Pneumococcal Vac Polyvalent     Other reaction(s): swelling and erythema   Statins Other (See Comments)    Elevated  liver enzymes   Crestor [Rosuvastatin] Rash   Sulfa Antibiotics Rash    Medications Prior to Visit:   Outpatient Medications Prior to Visit  Medication Sig Dispense Refill   albuterol (VENTOLIN HFA) 108 (90 Base) MCG/ACT inhaler Inhale 2 puffs into the lungs every 6 (six) hours as needed for shortness of breath (use as needed or before activity for shortness of breath or whezzing). 8 g 6   Biotin 5 MG CAPS Take 500 mg by mouth daily.     calcium-vitamin D (OSCAL  WITH D) 250-125 MG-UNIT tablet Take 1 tablet by mouth daily.     diphenhydrAMINE (BENADRYL) 25 mg capsule Take 25 mg by mouth every 6 (six) hours as needed.     ferrous sulfate 325 (65 FE) MG EC tablet Take 1 tablet (325 mg total) by mouth daily with breakfast. 30 tablet 3   fluticasone (FLONASE) 50 MCG/ACT nasal spray Place into both nostrils daily.     furosemide (LASIX) 20 MG tablet PLEASE SEE ATTACHED FOR DETAILED DIRECTIONS (Patient taking differently: Take 20 mg by mouth as needed.) 180 tablet 0   hydrocortisone cream 1 % Apply 1 application topically 3 (three) times daily.     levothyroxine (SYNTHROID) 50 MCG tablet Take 50 mcg by mouth daily before breakfast.     meclizine (ANTIVERT) 25 MG tablet Take 25 mg by mouth 3 (three) times daily as needed for dizziness.     montelukast (SINGULAIR) 10 MG tablet Take 1 tablet (10 mg total) by mouth daily. (Patient taking differently: Take 10 mg by mouth at bedtime.) 30 tablet 6   Multiple Vitamin (MULTIVITAMIN WITH MINERALS) TABS tablet Take 1 tablet by mouth daily.     Peak Flow Meter DEVI 1 puff by Does not apply route in the morning and at bedtime. 1 each 0   Polyethyl Glycol-Propyl Glycol (SYSTANE) 0.4-0.3 % SOLN Place 1 drop into both eyes 3 (three) times daily as needed (dry/irritated eyes.).     potassium chloride (KLOR-CON) 10 MEQ tablet Take 1 tablet (10 mEq total) by mouth daily. 90 tablet 3   propranolol (INDERAL) 20 MG tablet Take 1 tablet (20 mg total) by mouth 3 (three) times daily. 90 tablet 3   SALINE NASAL SPRAY NA Place 1 spray into the nose daily as needed (congestion).     Tiotropium Bromide Monohydrate (SPIRIVA RESPIMAT) 2.5 MCG/ACT AERS Inhale 2 puffs into the lungs daily. 4 g 5   TURMERIC PO Take 538 mg by mouth daily.     clopidogrel (PLAVIX) 75 MG tablet Take 75 mg by mouth daily.     diphenhydrAMINE-zinc acetate (BENADRYL ITCH STOPPING) cream Apply 1 each topically 3 (three) times daily as needed for itching. 3 times daily.      No facility-administered medications prior to visit.   Final Medications at End of Visit    Current Meds  Medication Sig   albuterol (VENTOLIN HFA) 108 (90 Base) MCG/ACT inhaler Inhale 2 puffs into the lungs every 6 (six) hours as needed for shortness of breath (use as needed or before activity for shortness of breath or whezzing).   Biotin 5 MG CAPS Take 500 mg by mouth daily.   calcium-vitamin D (OSCAL WITH D) 250-125 MG-UNIT tablet Take 1 tablet by mouth daily.   diphenhydrAMINE (BENADRYL) 25 mg capsule Take 25 mg by mouth every 6 (six) hours as needed.   ferrous sulfate 325 (65 FE) MG EC tablet Take 1 tablet (325 mg total) by mouth daily with breakfast.  fluticasone (FLONASE) 50 MCG/ACT nasal spray Place into both nostrils daily.   furosemide (LASIX) 20 MG tablet PLEASE SEE ATTACHED FOR DETAILED DIRECTIONS (Patient taking differently: Take 20 mg by mouth as needed.)   hydrocortisone cream 1 % Apply 1 application topically 3 (three) times daily.   levothyroxine (SYNTHROID) 50 MCG tablet Take 50 mcg by mouth daily before breakfast.   meclizine (ANTIVERT) 25 MG tablet Take 25 mg by mouth 3 (three) times daily as needed for dizziness.   montelukast (SINGULAIR) 10 MG tablet Take 1 tablet (10 mg total) by mouth daily. (Patient taking differently: Take 10 mg by mouth at bedtime.)   Multiple Vitamin (MULTIVITAMIN WITH MINERALS) TABS tablet Take 1 tablet by mouth daily.   Peak Flow Meter DEVI 1 puff by Does not apply route in the morning and at bedtime.   Polyethyl Glycol-Propyl Glycol (SYSTANE) 0.4-0.3 % SOLN Place 1 drop into both eyes 3 (three) times daily as needed (dry/irritated eyes.).   potassium chloride (KLOR-CON) 10 MEQ tablet Take 1 tablet (10 mEq total) by mouth daily.   propranolol (INDERAL) 20 MG tablet Take 1 tablet (20 mg total) by mouth 3 (three) times daily.   SALINE NASAL SPRAY NA Place 1 spray into the nose daily as needed (congestion).   Tiotropium Bromide Monohydrate  (SPIRIVA RESPIMAT) 2.5 MCG/ACT AERS Inhale 2 puffs into the lungs daily.   TURMERIC PO Take 538 mg by mouth daily.    Radiology:   CT cardiac morphology 03/14/2019: 1. Well positioned 23 mm Evolut Pro valve with normal appearing leaflets. Good coaptation with no HALT/HAM. Although target diameters for inflow, waist, outflow in systole are slightly low the mechanism of worsening PVL appears to be primarily a large area of calcification posterior to the stent valve from the native non coronary cusp. This area of calcium measures 4.9 x 5.7 mm and is best seen on SA images at 7:00.  Extracardiac:  1. Right middle lobe nodule is stable from 09/12/2018. Additional follow-up CT chest without contrast in 12 months is recommended to ensure stability and exclude malignancy. This recommendation follows the consensus statement: Guidelines for Management of Small Pulmonary Nodules Detected on CT Images: From the Fleischner Society 2017; Radiology 2017; 284:228-243. 2.  Aortic atherosclerosis (ICD10-I70.0). 3. Large hiatal hernia.  Cardiac Studies:    Exercise myoview stress 04/25/2017: 1. The patient performed treadmill exercise using a Bruce protocol, completing 5 minutes. The patient completed an estimated workload of 4.88 METS, reaching 103% of the maximum predicted heart rate. Stress symptoms included dyspnea. Exercise capacity fair for age. Normal hemodynamic response. Possible old anteroseptal infarct. Poor R wave progression. No ischemic changes on stress electrocardiogram. 2. The overall quality of the study is excellent. There is no evidence of abnormal lung activity. Stress and rest SPECT images demonstrate homogeneous tracer distribution throughout the myocardium. Gated SPECT imaging reveals normal myocardial thickening and wall motion. The left ventricular ejection fraction was normal (77%). 3. Low risk study.  Right and left heart catheterization 12/15/2018: RA 5/5, mean 5; RV 31/3,  EDP 6; PA 32/14, mean 21, PA saturation 67%; PW 11/10, mean 10 mmHg.  CO 3.84, CI  2.5756. QP/QS 1.0.  Normal right heart catheterization. Mildly elevated LVEDP at 15 mmHg.  Peak aortic valve gradient of 27, mean 23.6 mmHg with calculated aortic valve area 0.76 cm.  Findings consistent with severe calcific degenerative aortic valve stenosis. Left dominant circulation, proximal LAD 80% stenosis, large D1 with ostial 90% stenosis, medium sized RI with a  proximal 90% stenosis, dominant circumflex mild disease.  RCA mild disease and nondominant.   Recommendation: Patient will need to be evaluated for CABG and aortic valve replacement.  If the TAVR team feels she is a good candidate for TAVR, we could certainly consider either medical management of CAD or PCI to proximal LAD and TAVR. Patient be discharged home today with outpatient follow-up.  Patient has been referred to be evaluated by Dr. Gilford Raid. 30 mL contrast utilized.  Left Heart Catheterization 01/05/2019:  Successful orbital atherectomy followed by stenting of the proximal LAD with a 3.0 x 18 mm resolute Onyx DES and proximal segment postdilated with a 3.5 x 8 mm Sapphire [place].  Stenosis 90% to 0%.  TIMI-3 to TIMI-3 flow.  Orbital atherectomy followed by balloon angioplasty with 2.5 x 15 mm Sapphire balloon for D1, 90% to less than 15 to 20% stenosis.  TIMI-3 to TIMI-3 flow.  01/26/2019 (Dr. Angelena Form): Transcatheter Aortic Valve Replacement - Transfemoral Approach             Medtronic Evolut Pro Plus THV (size 23 mm, model #[id number], serial # [id number])   Carotid artery duplex 02/15/2020: Doppler velocity suggests stenosis in the right internal carotid artery (50-69%). Stenosis in the right external carotid artery (<50%). Doppler velocity suggests stenosis in the left internal carotid artery (16-49%). Stenosis in the left external carotid artery (<50%). Antegrade right vertebral artery flow. Antegrade left vertebral artery flow. Compared  to 09/28/2019, there is progression of disease severity in right and regression in the left carotid artery. Follow up in six months is appropriate if clinically indicated. EKG 05/05/2020: Sinus rhythm 70 bpm.  Left bundle branch block.   CTA chest 03/05/2019: 1. Well positioned 23 mm Evolut Pro valve with normal appearing leaflets. Good coaptation with no HALT/HAM. Although target diameters for inflow, waist, outflow in systole are slightly low the mechanism of worsening PVL appears to be primarily a large area of calcification posterior to the stent valve from the native non coronary cusp. This area of calcium measures 4.9 x 5.7 mm and is best seen on SA images at 7:00.   Echocardiogram 05/05/2020: 1. Left ventricular ejection fraction, by estimation, is 55 to 60%. The  left ventricle has normal function. The left ventricle has no regional  wall motion abnormalities. There is mild left ventricular hypertrophy.  Left ventricular diastolic function  could not be evaluated.   2. Right ventricular systolic function is normal. The right ventricular  size is normal. There is normal pulmonary artery systolic pressure.   3. Well seated 23 mm Evolute Pro TAVR valve. Mild paravalvular leak. Mean  PG 14 mmhg only mildly higher than prior (9 mmHg). DVR of 0.44 suggests no  significant aortic stenosis.   4. Mild mitral leaflet thickening. Trivial mitral regurgitation.   5. Mild to moderate tricuspid regurgitation. Estimated PASP 32 mmHg.   6. Mild pulmonic regurgitation.   7. Compared to previous outpatient study on 02/15/2020, no major change  noted.   PCV MYOCARDIAL PERFUSION WITH LEXISCAN 05/15/2020 Nondiagnostic ECG stress with lexiscan infusion. Resting EKG demonstrated normal sinus rhythm with left bundle branch block (IVCD). Peak EKG revealed no significant ST-T change from baseline abnormality. Myocardial perfusion is normal. Overall LV systolic function is normal without regional wall  motion abnormalities. Stress LV EF: 69%. No change from 04/25/2017. Low risk.  EKG  11/09/2020: Sinus rhythm at a rate of 70 bpm.  Left atrial enlargement.  LBBB, no further analysis.  Compared to EKG  03/12/2020, no significant change.  03/12/2020: Normal sinus rhythm at rate of 84 bpm, left atrial enlargement, left bundle branch block.  No further analysis. No significant change from EKG 02/28/2020  Assessment     ICD-10-CM   1. Coronary artery disease involving native coronary artery of native heart without angina pectoris  I25.10 EKG 12-Lead    2. Abnormal LFTs  R79.89     3. Hypercholesteremia  E78.00     4. Bilateral carotid artery stenosis  I65.23 PCV CAROTID DUPLEX (BILATERAL)       No orders of the defined types were placed in this encounter.  Medications Discontinued During This Encounter  Medication Reason   diphenhydrAMINE-zinc acetate (BENADRYL ITCH STOPPING) cream Error     Recommendations:   MEGA KINKADE  is a 87 y.o. Caucasian female with history of benign positional vertigo, chronic dyspnea, chronic back pain, hypertension, mixed hyperlipidemia and  symptomatic aortic stenosis, CAD S/P PCI to LAD/D1  on 01/05/2019. Underwent elective TAVR on 01/26/2019, mild to moderate paravalvular leak.   Patient's chronic dyspnea is likely multifactorial including underlying lung pathology as it has improved with use of Spiriva.  Significant hiatal hernia may also be contributing to patient's dyspnea on exertion.   Patient presents for follow-up.  Her renal function has improved and she is now following with nephrology for stable 3B CKD.  Patient's blood pressure is well controlled.  In regard to hyperlipidemia, LDL is uncontrolled.  However given recent the elevated LFTs we will hold off on reinitiation of statin therapy until patient has followed up with Dr. Michail Sermon for further GI evaluation.  Could certainly consider reinitiating low-dose statin therapy, however given advanced  age we will hold off at this time.  Have requested patient follow-up with Dr. Hubbard Hartshorn and have records sent to our office, further recommendations regarding lipid management pending review of these records.  In regard to carotid artery stenosis, patient is due for surveillance, will therefore order at this time.  Patient is otherwise stable from a cardiovascular standpoint. Follow up in 6 months, sooner if needed.    Alethia Berthold, PA-C 10/23/2020, 1:19 PM Office: [phone]

## 2020-10-23 ENCOUNTER — Ambulatory Visit: Payer: Federal, State, Local not specified - PPO | Admitting: Student

## 2020-10-23 ENCOUNTER — Encounter: Payer: Self-pay | Admitting: Student

## 2020-10-23 ENCOUNTER — Other Ambulatory Visit: Payer: Self-pay

## 2020-10-23 VITALS — BP 123/75 | HR 80 | Temp 97.8°F | Ht 60.0 in | Wt 118.0 lb

## 2020-10-23 DIAGNOSIS — I2510 Atherosclerotic heart disease of native coronary artery without angina pectoris: Secondary | ICD-10-CM

## 2020-10-23 DIAGNOSIS — R7989 Other specified abnormal findings of blood chemistry: Secondary | ICD-10-CM

## 2020-10-23 DIAGNOSIS — I6523 Occlusion and stenosis of bilateral carotid arteries: Secondary | ICD-10-CM

## 2020-10-23 DIAGNOSIS — E7800 Pure hypercholesterolemia, unspecified: Secondary | ICD-10-CM

## 2020-10-23 NOTE — Patient Instructions (Addendum)
Make sure you have follow up with GI (Dr. Michail Sermon) and please have records sent to our office after that visit. In view of elevate LFTs currently holding statin. Is Dr. Michail Sermon comfortable with you restarting statin therapy?

## 2020-10-24 ENCOUNTER — Other Ambulatory Visit: Payer: Self-pay | Admitting: Cardiology

## 2020-10-24 DIAGNOSIS — E7800 Pure hypercholesterolemia, unspecified: Secondary | ICD-10-CM

## 2020-10-27 ENCOUNTER — Other Ambulatory Visit: Payer: Self-pay

## 2020-10-27 ENCOUNTER — Ambulatory Visit: Payer: Federal, State, Local not specified - PPO

## 2020-10-27 DIAGNOSIS — I6523 Occlusion and stenosis of bilateral carotid arteries: Secondary | ICD-10-CM

## 2020-10-30 NOTE — Progress Notes (Signed)
No evident stenosis on this study. Will consider repeating in 1 year but can discuss further at next office visit.

## 2020-10-31 NOTE — Progress Notes (Signed)
Called and spoke to pt, pt voiced understanding.

## 2020-11-06 ENCOUNTER — Other Ambulatory Visit: Payer: Self-pay | Admitting: Physician Assistant

## 2020-11-06 DIAGNOSIS — R6889 Other general symptoms and signs: Secondary | ICD-10-CM

## 2020-11-06 DIAGNOSIS — D508 Other iron deficiency anemias: Secondary | ICD-10-CM

## 2020-11-06 DIAGNOSIS — D649 Anemia, unspecified: Secondary | ICD-10-CM

## 2020-11-07 ENCOUNTER — Other Ambulatory Visit: Payer: Self-pay

## 2020-11-07 ENCOUNTER — Inpatient Hospital Stay: Payer: Federal, State, Local not specified - PPO | Attending: Physician Assistant | Admitting: Physician Assistant

## 2020-11-07 ENCOUNTER — Other Ambulatory Visit: Payer: Self-pay | Admitting: Cardiology

## 2020-11-07 ENCOUNTER — Inpatient Hospital Stay: Payer: Federal, State, Local not specified - PPO

## 2020-11-07 VITALS — BP 117/62 | HR 69 | Temp 97.7°F | Resp 16 | Ht 60.0 in | Wt 117.0 lb

## 2020-11-07 DIAGNOSIS — D631 Anemia in chronic kidney disease: Secondary | ICD-10-CM | POA: Insufficient documentation

## 2020-11-07 DIAGNOSIS — D649 Anemia, unspecified: Secondary | ICD-10-CM | POA: Diagnosis not present

## 2020-11-07 DIAGNOSIS — Z8639 Personal history of other endocrine, nutritional and metabolic disease: Secondary | ICD-10-CM | POA: Diagnosis not present

## 2020-11-07 DIAGNOSIS — K7460 Unspecified cirrhosis of liver: Secondary | ICD-10-CM | POA: Diagnosis not present

## 2020-11-07 DIAGNOSIS — N189 Chronic kidney disease, unspecified: Secondary | ICD-10-CM | POA: Insufficient documentation

## 2020-11-07 DIAGNOSIS — I129 Hypertensive chronic kidney disease with stage 1 through stage 4 chronic kidney disease, or unspecified chronic kidney disease: Secondary | ICD-10-CM | POA: Diagnosis present

## 2020-11-07 DIAGNOSIS — D508 Other iron deficiency anemias: Secondary | ICD-10-CM

## 2020-11-07 DIAGNOSIS — R6889 Other general symptoms and signs: Secondary | ICD-10-CM

## 2020-11-07 LAB — CMP (CANCER CENTER ONLY)
ALT: 56 U/L — ABNORMAL HIGH (ref 0–44)
AST: 71 U/L — ABNORMAL HIGH (ref 15–41)
Albumin: 3.7 g/dL (ref 3.5–5.0)
Alkaline Phosphatase: 328 U/L — ABNORMAL HIGH (ref 38–126)
Anion gap: 13 (ref 5–15)
BUN: 30 mg/dL — ABNORMAL HIGH (ref 8–23)
CO2: 27 mmol/L (ref 22–32)
Calcium: 10 mg/dL (ref 8.9–10.3)
Chloride: 101 mmol/L (ref 98–111)
Creatinine: 1.65 mg/dL — ABNORMAL HIGH (ref 0.44–1.00)
GFR, Estimated: 30 mL/min — ABNORMAL LOW (ref >60)
Glucose, Bld: 90 mg/dL (ref 70–99)
Potassium: 4 mmol/L (ref 3.5–5.1)
Sodium: 141 mmol/L (ref 135–145)
Total Bilirubin: 0.7 mg/dL (ref 0.3–1.2)
Total Protein: 7.6 g/dL (ref 6.5–8.1)

## 2020-11-07 LAB — CBC WITH DIFFERENTIAL (CANCER CENTER ONLY)
Abs Immature Granulocytes: 0.03 10*3/uL (ref 0.00–0.07)
Basophils Absolute: 0.1 10*3/uL (ref 0.0–0.1)
Basophils Relative: 1 %
Eosinophils Absolute: 0.2 10*3/uL (ref 0.0–0.5)
Eosinophils Relative: 2 %
HCT: 36.1 % (ref 36.0–46.0)
Hemoglobin: 11.8 g/dL — ABNORMAL LOW (ref 12.0–15.0)
Immature Granulocytes: 1 %
Lymphocytes Relative: 25 %
Lymphs Abs: 1.7 10*3/uL (ref 0.7–4.0)
MCH: 31.2 pg (ref 26.0–34.0)
MCHC: 32.7 g/dL (ref 30.0–36.0)
MCV: 95.5 fL (ref 80.0–100.0)
Monocytes Absolute: 0.6 10*3/uL (ref 0.1–1.0)
Monocytes Relative: 9 %
Neutro Abs: 4 10*3/uL (ref 1.7–7.7)
Neutrophils Relative %: 62 %
Platelet Count: 224 10*3/uL (ref 150–400)
RBC: 3.78 MIL/uL — ABNORMAL LOW (ref 3.87–5.11)
RDW: 13.7 % (ref 11.5–15.5)
WBC Count: 6.5 10*3/uL (ref 4.0–10.5)
nRBC: 0 % (ref 0.0–0.2)

## 2020-11-07 LAB — VITAMIN B12: Vitamin B-12: 513 pg/mL (ref 180–914)

## 2020-11-07 LAB — IRON AND TIBC
Iron: 92 ug/dL (ref 41–142)
Saturation Ratios: 24 % (ref 21–57)
TIBC: 379 ug/dL (ref 236–444)
UIBC: 287 ug/dL (ref 120–384)

## 2020-11-07 LAB — RETIC PANEL
Immature Retic Fract: 4.9 % (ref 2.3–15.9)
RBC.: 3.73 MIL/uL — ABNORMAL LOW (ref 3.87–5.11)
Retic Count, Absolute: 49.6 10*3/uL (ref 19.0–186.0)
Retic Ct Pct: 1.3 % (ref 0.4–3.1)
Reticulocyte Hemoglobin: 32.7 pg (ref >27.9)

## 2020-11-07 LAB — FOLATE: Folate: 38 ng/mL (ref >5.9)

## 2020-11-07 LAB — FERRITIN: Ferritin: 47 ng/mL (ref 11–307)

## 2020-11-07 MED ORDER — FERROUS SULFATE 325 (65 FE) MG PO TBEC
325.0000 mg | DELAYED_RELEASE_TABLET | Freq: Every day | ORAL | 3 refills | Status: DC
Start: 2020-11-07 — End: 2022-04-30

## 2020-11-07 NOTE — Progress Notes (Signed)
[place] [phone]   Fax:([phone] [place] NOTE  Patient Care Team: Kelton Pillar, MD as PCP - General (Family Medicine) Adrian Prows, MD as PCP - Cardiology (Cardiology) Burnell Blanks, MD as PCP - Structural Heart (Cardiology)  Hematological/Oncological History 1) Labs from PCP, Dr. Kelton Pillar, [place] [facility] 09/21/2019: WBC 7.3, Hgb 12.3, MCV 91.2, Plt 244 03/27/2020: WBC 14.9 (H), Hgb 8.5 (L), MCV 91.0, Plt 339, Iron 35 (L), Saturation 7% (L), Transferrin 335, TIBC 469 (H) 04/11/2020: WBC 16.1 (H), Hgb 9.2 (L), MCV 86.0, Plt 379 05/10/2020: WBC 7.6, Hgb 8.9 (L), MCV 65.2 (L), Plt 361  2) 05/30/2020: Establish care with Dede Query PA-C. Recommended to start ferrous sulfate 325 mg once daily.   CHIEF COMPLAINTS:  Normocytic anemia.   HISTORY OF PRESENTING ILLNESS:  [name] [name] 87 y.o. female returns today for follow-up from normocytic anemia.  Patient is unaccompanied for this visit.  She reports that her energy levels are fairly stable. She is able to complete her daily ADLS on her own. She denies any changes to her appetite or weight. She notes regular bowel movements with mild constipation with oral iron pills.  She denies easy bruising or signs of bleeding. She has chronic shortness of breath mainly with exertion secondary to emphysema. She denies any fevers, chill, night sweats, chest pain, cough, nausea, vomiting or abdominal pain.  She has no other complaints.  Rest of the 10 point ROS is below.  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    Hyperlipidemia    Hypertension    Hypothyroidism    Lipoma    left arm   Nasal polyps    Pneumonia    Pulmonary nodule    noted on pre TAVR CT   Samter's triad    Severe aortic stenosis     SURGICAL HISTORY: Past Surgical History:  Procedure Laterality Date   CHOLECYSTECTOMY  02/27/04   CORONARY ATHERECTOMY N/A 01/05/2019   Procedure: CORONARY ATHERECTOMY;  Surgeon: Adrian Prows, MD;  Location: [place] [facility];  Service: Cardiovascular;  Laterality: N/A;   CORONARY BALLOON ANGIOPLASTY N/A 01/05/2019   Procedure: CORONARY BALLOON ANGIOPLASTY;  Surgeon: Adrian Prows, MD;  Location: [place] [facility];  Service: Cardiovascular;  Laterality: N/A;   CORONARY STENT INTERVENTION N/A 01/05/2019   Procedure: CORONARY STENT INTERVENTION;  Surgeon: Adrian Prows, MD;  Location: [place] [facility];  Service: Cardiovascular;  Laterality: N/A;   CORONARY STENT INTERVENTION  01/05/2019   INCONTINENCE SURGERY  09/12/09   NASAL SINUS SURGERY     several     ORIF ANKLE FRACTURE Right 09/11/2013   Procedure: OPEN REDUCTION INTERNAL FIXATION (ORIF) ANKLE FRACTURE;  Surgeon: Augustin Schooling, MD;  Location: [facility];  Service: Orthopedics;  Laterality: Right;   RIGHT/LEFT HEART CATH AND CORONARY ANGIOGRAPHY N/A 12/15/2018   Procedure: RIGHT/LEFT HEART CATH AND CORONARY ANGIOGRAPHY;  Surgeon: Adrian Prows, MD;  Location: [place] [facility];  Service: Cardiovascular;  Laterality: N/A;   ROTATOR CUFF REPAIR  09/27/2003   SINUS ENDO WITH FUSION Bilateral 09/08/2020   Procedure: Bilateral Revision of Endoscopic Sinus Surgery with Fusion and Intranasal Polypectomy;  Surgeon: Jerrell Belfast, MD;  Location: [place];  Service: ENT;  Laterality: Bilateral;   TRANSCATHETER AORTIC VALVE REPLACEMENT, TRANSFEMORAL  01/26/2019   TRANSCATHETER AORTIC VALVE REPLACEMENT, TRANSFEMORAL N/A 01/26/2019   Procedure: TRANSCATHETER AORTIC VALVE REPLACEMENT, TRANSFEMORAL;  Surgeon:  Burnell Blanks, MD;  Location: [place];  Service: Open Heart Surgery;  Laterality: N/A;   TRIGGER FINGER RELEASE  10/29/06    SOCIAL HISTORY: Social History   Socioeconomic History   Marital status: Married    Spouse name: Not on file   Number of children: 3   Years of education: Not on file   Highest education level: Not on file  Occupational History   Not on file  Tobacco Use   Smoking status: Never   Smokeless tobacco: Never  Vaping Use   Vaping Use: Never used  Substance and Sexual Activity   Alcohol use: No   Drug use: No   Sexual activity: Not on file  Other Topics Concern   Not on file  Social History Narrative   Not on file   Social Determinants of Health   Financial Resource Strain: Not on file  Food Insecurity: Not on file  Transportation Needs: Not on file  Physical Activity: Not on file  Stress: Not on file  Social Connections: Not on file  Intimate Partner Violence: Not on file    FAMILY HISTORY: Family History  Problem Relation Age of Onset   Tuberculosis Mother    Other Father        Bronchiectasis   Cancer Maternal Grandmother        ovarian   Sarcoidosis Sister    Asthma Maternal Grandfather    Heart disease Brother    Heart attack Brother    Breast cancer Daughter    Brain cancer Daughter     ALLERGIES:  is allergic to aspirin, bee venom, ceclor [cefaclor], pneumococcal vac polyvalent, statins, crestor [rosuvastatin], and sulfa antibiotics.  MEDICATIONS:  Current Outpatient Medications  Medication Sig Dispense Refill   albuterol (VENTOLIN HFA) 108 (90 Base) MCG/ACT inhaler Inhale 2 puffs into the lungs every 6 (six) hours as needed for shortness of breath (use as needed or before activity for shortness of breath or whezzing). 8 g 6   Biotin 5 MG CAPS Take 500 mg by mouth daily.     calcium-vitamin D (OSCAL WITH D) 250-125 MG-UNIT tablet Take 1 tablet by mouth daily.     clopidogrel (PLAVIX) 75 MG tablet Take 75 mg by mouth daily.      diphenhydrAMINE (BENADRYL) 25 mg capsule Take 25 mg by mouth every 6 (six) hours as needed.     ferrous sulfate 325 (65 FE) MG EC tablet Take 1 tablet (325 mg total) by mouth daily with breakfast. 30 tablet 3   fluticasone (FLONASE) 50 MCG/ACT nasal spray Place into both nostrils daily.     furosemide (LASIX) 20 MG tablet PLEASE SEE ATTACHED FOR DETAILED DIRECTIONS (Patient taking differently: Take 20 mg by mouth as needed.) 180 tablet 0   hydrocortisone cream 1 % Apply 1 application topically 3 (three) times daily.     levothyroxine (SYNTHROID) 50 MCG tablet Take 50 mcg by mouth daily before breakfast.     meclizine (ANTIVERT) 25 MG tablet Take 25 mg by mouth 3 (three) times  daily as needed for dizziness.     montelukast (SINGULAIR) 10 MG tablet Take 1 tablet (10 mg total) by mouth daily. (Patient taking differently: Take 10 mg by mouth at bedtime.) 30 tablet 6   Multiple Vitamin (MULTIVITAMIN WITH MINERALS) TABS tablet Take 1 tablet by mouth daily.     Peak Flow Meter DEVI 1 puff by Does not apply route in the morning and at bedtime. 1 each 0   Polyethyl Glycol-Propyl Glycol (SYSTANE) 0.4-0.3 % SOLN Place 1 drop into both eyes 3 (three) times daily as needed (dry/irritated eyes.).     potassium chloride (KLOR-CON) 10 MEQ tablet Take 1 tablet (10 mEq total) by mouth daily. 90 tablet 3   propranolol (INDERAL) 20 MG tablet Take 1 tablet (20 mg total) by mouth 3 (three) times daily. 90 tablet 3   SALINE NASAL SPRAY NA Place 1 spray into the nose daily as needed (congestion).     Tiotropium Bromide Monohydrate (SPIRIVA RESPIMAT) 2.5 MCG/ACT AERS Inhale 2 puffs into the lungs daily. 4 g 5   TURMERIC PO Take 538 mg by mouth daily.     No current facility-administered medications for this visit.    REVIEW OF SYSTEMS:   Constitutional: ( - ) fevers, ( - )  chills , ( - ) night sweats Eyes: ( - ) blurriness of vision, ( - ) double vision, ( - ) watery eyes Ears, nose, mouth, throat, and face: ( -  ) mucositis, ( - ) sore throat Respiratory: ( - ) cough, ( + ) dyspnea, ( - ) wheezes Cardiovascular: ( - ) palpitation, ( - ) chest discomfort, ( - ) lower extremity swelling Gastrointestinal:  ( - ) nausea, ( - ) heartburn, ( - ) change in bowel habits Skin: ( - ) abnormal skin rashes Lymphatics: ( - ) new lymphadenopathy, ( - ) easy bruising Neurological: ( - ) numbness, ( - ) tingling, ( - ) new weaknesses Behavioral/Psych: ( - ) mood change, ( - ) new changes  All other systems were reviewed with the patient and are negative.  PHYSICAL EXAMINATION: ECOG PERFORMANCE STATUS: 1 - Symptomatic but completely ambulatory  Vitals:   11/07/20 1253  BP: 117/62  Pulse: 69  Resp: 16  Temp: 97.7 F (36.5 C)  SpO2: 98%    Filed Weights   11/07/20 1253  Weight: 117 lb (53.1 kg)     GENERAL: well appearing female in NAD  SKIN: skin color, texture, turgor are normal, no rashes or significant lesions EYES: conjunctiva are pink and non-injected, sclera clear OROPHARYNX: no exudate, no erythema; lips, buccal mucosa, and tongue normal  LUNGS: clear to auscultation and percussion with normal breathing effort HEART: regular rate & rhythm and no murmurs and no lower extremity edema ABDOMEN: soft, non-tender, non-distended, normal bowel sounds Musculoskeletal: no cyanosis of digits and no clubbing  PSYCH: alert & oriented x 3, fluent speech NEURO: no focal motor/sensory deficits  LABORATORY DATA:  I have reviewed the data as listed CBC Latest Ref Rng & Units 11/07/2020 09/07/2020 09/01/2020  WBC 4.0 - 10.5 K/uL 6.5 8.2 13.9(H)  Hemoglobin 12.0 - 15.0 g/dL 11.8(L) 11.7(L) 12.0  Hematocrit 36.0 - 46.0 % 36.1 36.6 37.3  Platelets 150 - 400 K/uL 224 244 288    CMP Latest Ref Rng & Units 11/07/2020 09/18/2020 09/07/2020  Glucose 70 - 99 mg/dL 90 153(H) 152(H)  BUN 8 - 23 mg/dL 30(H) 17 22  Creatinine 0.44 - 1.00 mg/dL 1.65(H) 1.22(H) 1.27(H)  Sodium 135 - 145 mmol/L 141 132(L) 134(L)   Potassium 3.5 - 5.1 mmol/L 4.0 4.3 4.0  Chloride 98 - 111 mmol/L 101 96 100  CO2 22 - 32 mmol/L '27 22 26  '$ Calcium 8.9 - 10.3 mg/dL 10.0 8.8 9.2  Total Protein 6.5 - 8.1 g/dL 7.6 - 6.7  Total Bilirubin 0.3 - 1.2 mg/dL 0.7 - 1.1  Alkaline Phos 38 - 126 U/L 328(H) - 319(H)  AST 15 - 41 U/L 71(H) - 104(H)  ALT 0 - 44 U/L 56(H) - 133(H)    RADIOGRAPHIC STUDIES: I have personally reviewed the radiological images as listed and agreed with the findings in the report. US RENAL  Result Date: 10/18/2020 CLINICAL DATA:  Chronic renal disease. EXAM: RENAL / URINARY TRACT ULTRASOUND COMPLETE COMPARISON:  CT 01/19/2020. FINDINGS: Right Kidney: Renal measurements: 8.1 x 3.1 x 3.7 cm = volume: 49 mL. Echogenicity within normal limits. No mass or hydronephrosis visualized. Left Kidney: Renal measurements: 8.7 x 4.1 x 3.5 cm = volume: 65 mL. Echogenicity within normal limits. No mass or hydronephrosis visualized. Bladder: Bladder is empty. Other: None. IMPRESSION: 1. No acute or focal renal abnormality identified. Renal echotexture is normal. No hydronephrosis. 2.  The bladder is empty. Electronically Signed   By: Marcello Moores  Register M.D.   On: 10/18/2020 09:21   PCV CAROTID DUPLEX (BILATERAL)  Result Date: 10/29/2020 Carotid artery duplex 10/27/2020: Duplex suggests stenosis in the right internal carotid artery (1-15%).  Duplex suggests stenosis in the right external carotid artery (<50%). Duplex suggests stenosis in the left internal carotid artery (1-15%). Antegrade right vertebral artery flow. Antegrade left vertebral artery flow. Compared to the study done on 02/15/2020, bilateral ICA stenosis not evident in the present study. Follow up in one year is appropriate if clinically indicated.   ASSESSMENT & PLAN [name] [name] is a 87 y.o. female presenting to the clinic for normocytic anemia.   # Normocytic anemia: --Likely multifactorial secondary to iron deficiency and CKD.  --Currently on ferrous sulfate 325  mg once daily. Recommend to continue but okay to take every other day to minimize constipation.  --Patient denies any bleeding at this time. She follows with Dr. Wilford [facility]). --Labs today show mild anemia with Hgb 11.8. Iron panel, vitamin B12 and folate levels were normal.  --RTC in 6 months with repeat labs.   # CKD:  --Creatinine levels oscillate. Today, creatinine is 1.65 --She is under the care of Dr. Gean Quint at [facility].   #Cirrhosis: --Liver enzymes oscillate but improved from 09/07/2020.  --Managed by Dr. Michail Sermon.   No orders of the defined types were placed in this encounter.   All questions were answered. The patient knows to call the clinic with any problems, questions or concerns.  I have spent a total of 25 minutes minutes of face-to-face and non-face-to-face time, preparing to see the patient, obtaining and/or reviewing separately obtained history, performing a medically appropriate examination, counseling and educating the patient, ordering tests, communicating with other health care professionals, documenting clinical information in the electronic health record, and care coordination.    Dede Query, PA-C Department of Hematology/Oncology  at [facility] Phone: [phone]

## 2020-11-13 ENCOUNTER — Encounter: Payer: Federal, State, Local not specified - PPO | Attending: Student | Admitting: Dietician

## 2020-11-13 ENCOUNTER — Encounter: Payer: Self-pay | Admitting: Dietician

## 2020-11-13 ENCOUNTER — Other Ambulatory Visit: Payer: Self-pay

## 2020-11-13 DIAGNOSIS — N1832 Chronic kidney disease, stage 3b: Secondary | ICD-10-CM | POA: Insufficient documentation

## 2020-11-13 NOTE — Progress Notes (Signed)
Medical Nutrition Therapy  Appointment Start time:  9509  Appointment End time:  1220 Patient is here today alone.  She was scheduled for next week but worked her in today.  Patient continues to eat balanced meals. She states she is no longer losing excessive amounts of hair.  Continues iron, biotin, MVI, calcium with vitamin D and is taking turmeric. Appetite fair and states that she desires fruits and vegetables most often, does not care of much meat but tries to include once daily. Drinks Boost if she is not hungry or eats a protein bar. She is mindful of sodium most of the time but does enjoy deli meat and occasional small portion of ham. She takes Lasix periodically as needed and took this yesterday due to shortness of breath and stated that she felt better. Weight decreased 2 lbs since 07/2020   Primary concerns today: initial visit They would like to learn max calories in less volume to improve her weight. She is drinking 1 Boost daily. Patient states that she does not have an appetite and has not had a sense of smell for years.  Referral diagnosis: abnormal weight loss Preferred learning style: no preference indicated Learning readiness: ready, change in progress   NUTRITION ASSESSMENT 08/14/2020 Nutrition focussed nutrition examination preformed: Patient with increased hair loss noted and very easily pluckable hair. Decreased tissue noted at temples. Adequate muscle at collar bone region. Fair scapular muscle. Brittle nails with increased ridging the length of the nails.  Horizontal bumps on thumbs only. Walks well, fair balance. No noted edema.  Anthropometrics  116 lbs 11/13/2020 118 lbs 08/14/2020 119 lbs 06/06/2020 135 lbs 2019/20 Lost weight with increased pulmonary and cardiac issues  Clinical Medical Hx: Patients daughter reports patient with hiatal hernia that has pushed Dottie's stomach into her chest cavity, decreased lung capacity, fatty liver, CHF due to  mitral valve issues now requiring lasix.  She has had worsening kidney function (now CKD stage 3b) and anemia.  Constipation due to iron supplement.  Vitamin D at max range.  She is on a Calcium/D supplement and MVI but has stopped the vitamin D supplement.  Expected MTHFR gene mutation due to daughter's known positive. When she eats too much she has increased shortness of breath.  Medications: includes Lasix, potassium Labs: GFR 30, BUN 30, Creatinine 1.65, Potassium 4, vitamin B-12 11/07/2020 GFR 31, AST 75, ALT 48 05/30/2020, Hgb 9.6% 04/17/2020 Notable Signs/Symptoms: appears well nourished, independent  Lifestyle & Dietary Hx Patient lives with her husband.  She does cooking from scratch.  Her husband has started having problems with aspiration (to be further worked up by SLP) and he likes 3 meals per day with meat, vegetables, starch.   She uses a small amount of sea salt.  Estimated daily fluid intake: 32 oz Supplements: MIV, calcium/D, Biotin, Iron, turmeric   24-Hr Dietary Recall Enjoys snacking on lightly salted nuts, peanut butter, fruit Breakfast:  Boost or Protein bar with clementine when not hungry OR chocolate milk, Nutrigrain bar OR cereal and Milk Lunch: homemade vegetable beef soup, cheese and crackers, clementine OR 1/2 ham and cheese sandwich, slaw or salad with olive oil dressing OR tomato sandwich with 1-2 strips bacon OR chicken salad and potato salad Dinner:  frozen meat or vegetable lasagne with salad OR meatloaf, ptatoes, baked potato with cheese and bacon or sweet potato, cabbage Beverages: (32 oz fluid daily) water, decaf tea with stevia,4 oz OJ, occasional smoothie (juice, fruit, activia yogurt), occasional cherry coke  Estimated Energy Needs Calories: 1600 Protein: 45g  NUTRITION DIAGNOSIS  NB-1.1 Food and nutrition-related knowledge deficit As related to multiple diagnosis (CKD, CHF, increased LFT's,  poor appetite).  As evidenced by diet hx and patient  report.  NUTRITION INTERVENTION  Nutrition education (E-1) on the following topics:  Protein intake, food choices, adequacy without excess Supplementation and CKD (turmeric) Sodium in processed meats and mindfulness in cooking Mindfulness of intake to continue to maintain weight Dark soda and kidneys  Handouts Provided Include (initial visit) Underweight nutrition therapy Label reading Low sodium nutrition therapy from AND Seasoning tips  Learning Style & Readiness for Change Teaching method utilized: Visual & Auditory  Demonstrated degree of understanding via: Teach Back  Barriers to learning/adherence to lifestyle change: none  Plan: Consider biotin supplement to see if this helps your hair loss.  Generally, it only helps when there is a deficiency.  Check your multi vitamin as it may be included.  Consider supplementing with the boost daily to see if this makes any difference overall in your hair loss.  When choosing a multiple vitamin, consider an option that has Methyl folate rather than folic acid (due to your probability of MTHFR gene mutation).  Continue your balanced diet. Continue to limit added salt. Continue to stay hydrated  If you begin losing weight again, begin to find ways to increase your calories such as adding olive oil, regular mayo, regular sour cream, whole milk yogurt, fruit smoothies, cranberry juice, lemonade to your day.  MONITORING & EVALUATION Dietary intake, weekly physical activity, and label reading in 3 months.  Next Steps  Patient is to call for questions as needed.

## 2020-11-13 NOTE — Patient Instructions (Signed)
Continue balanced meals. Be mindful of salt. Choose fresh meets rather than processed. Unsalted nuts rather than salted. Avoid Dark soda Continue the Boost as needed and increase to daily if you are losing weight.

## 2020-11-15 ENCOUNTER — Ambulatory Visit: Payer: Federal, State, Local not specified - PPO | Admitting: Pulmonary Disease

## 2020-11-17 ENCOUNTER — Ambulatory Visit: Payer: Federal, State, Local not specified - PPO | Admitting: Dietician

## 2020-11-20 ENCOUNTER — Ambulatory Visit: Payer: Federal, State, Local not specified - PPO | Admitting: Dietician

## 2020-11-26 ENCOUNTER — Other Ambulatory Visit: Payer: Self-pay | Admitting: Cardiology

## 2020-12-09 ENCOUNTER — Other Ambulatory Visit: Payer: Self-pay | Admitting: Student

## 2020-12-09 DIAGNOSIS — I5033 Acute on chronic diastolic (congestive) heart failure: Secondary | ICD-10-CM

## 2020-12-13 ENCOUNTER — Other Ambulatory Visit: Payer: Self-pay | Admitting: Cardiology

## 2020-12-13 DIAGNOSIS — I2510 Atherosclerotic heart disease of native coronary artery without angina pectoris: Secondary | ICD-10-CM

## 2020-12-25 ENCOUNTER — Other Ambulatory Visit: Payer: Federal, State, Local not specified - PPO

## 2020-12-25 ENCOUNTER — Ambulatory Visit: Payer: Federal, State, Local not specified - PPO | Admitting: Pulmonary Disease

## 2020-12-25 ENCOUNTER — Other Ambulatory Visit: Payer: Self-pay

## 2020-12-25 ENCOUNTER — Ambulatory Visit (HOSPITAL_COMMUNITY)
Admission: RE | Admit: 2020-12-25 | Discharge: 2020-12-25 | Disposition: A | Discharge status: Home / Self Care | Payer: Federal, State, Local not specified - PPO | Source: Ambulatory Visit | Attending: Oncology | Admitting: Oncology

## 2020-12-25 DIAGNOSIS — R911 Solitary pulmonary nodule: Secondary | ICD-10-CM | POA: Insufficient documentation

## 2020-12-27 ENCOUNTER — Telehealth: Payer: Federal, State, Local not specified - PPO | Admitting: Oncology

## 2020-12-28 ENCOUNTER — Encounter: Payer: Self-pay | Admitting: Pulmonary Disease

## 2020-12-28 ENCOUNTER — Other Ambulatory Visit: Payer: Self-pay

## 2020-12-28 ENCOUNTER — Ambulatory Visit: Payer: Federal, State, Local not specified - PPO | Admitting: Pulmonary Disease

## 2020-12-28 VITALS — BP 122/68 | HR 72 | Temp 98.0°F | Ht 60.0 in | Wt 116.0 lb

## 2020-12-28 DIAGNOSIS — R918 Other nonspecific abnormal finding of lung field: Secondary | ICD-10-CM

## 2020-12-28 DIAGNOSIS — J432 Centrilobular emphysema: Secondary | ICD-10-CM

## 2020-12-28 DIAGNOSIS — R0602 Shortness of breath: Secondary | ICD-10-CM | POA: Diagnosis not present

## 2020-12-28 MED ORDER — MONTELUKAST SODIUM 10 MG PO TABS: 10.0000 mg | ORAL_TABLET | Freq: Every day | ORAL | 3 refills | Status: DC

## 2020-12-28 NOTE — Patient Instructions (Signed)
Asthma/Emphysema - stable, symptomatic on exertion --CONTINUE Spiriva 2.5 mcg TWO puffs ONCE a day. REFILL --CONTINUE Albuterol as needed for shortness of breath or wheezing --CONTINUE Singulair 10 mg daily. REFILL  RML lung nodule Stable subcentimeter nodules --Stable 7 mm nodule since 2020 --No further follow-up needed. Patient not interested in aggressive management  Follow-up with me in 6 months

## 2020-12-28 NOTE — Progress Notes (Signed)
Subjective:   PATIENT ID: [name] [name] GENDER: female DOB: [birth year], MRN: [id number]   HPI  Chief Complaint  Patient presents with   Follow-up    Patient has no complaints. Wants doctor to look at CT results.    Reason for Visit: Follow-up  [name] is a 87 year old female never smoker with emphysema, CAD s/p stent, AVS s/pt TAVR 01/2019, pulmonary nodules who presents for follow-up.  Synopsis:  She was referred to Pulmonary after being discharged from the hospital in 04/2020. Per review of discharge note she required 1-2L O2 in the ED. V/Q scan was negative for PE. While inpatient she was diuresed. Cardiology was consulted and plan for outpatient nuclear stress test. She was also referred to Pulmonary to consider PFT for evaluation of her emphysema. At time of discharge she did not require any oxygen. She has been on lasix and has noticed weight gain of 10lbs post-discharge. This morning is 116lb, baseline is 113-115lb.  She reports childhood asthma and respiratory complications during pregnancy in her 20-30s. She was on a nebulizer 2-3 x daily in her 50-60s and needing steroids at least twice a year. However in her 70s-80s she has not had any issues or needing any medications for her asthma and this may have been related from removing environmental issues (older home, dust, tobacco farms, gardening)  She has had multiple sinus surgery x 4 including for nasal polyp removal. She was seen by ENT yesterday. She takes zyrtec and saline rinses. She is scheduled for stress test next week with [facility].  05/18/20 Since our last visit, she has been tolerating Spiriva and feels that it is improving her shortness of breath. Her daughter is present with her daughter and wish to review PFT results.  10/02/20 Since our last visit she was seen by ENT and underwent sinus surgery (sinus fusion and intranasal polypectomy) in Aug 2022 with path returning with pseudomonas and fungus.  She was treated with a course of amoxicillin. Last ENT visit was 09/29/20 and started on nasal rinses and sprays. Not sure if this is effective yet since she recently started. She is on diuretics which she manages diligently with daily weight checks. She has been referred to a Nephrology. She tolerating the Spiriva and feels it is helping her. Uses albuterol on average once a week for shortness of breath.    12/28/20 Since our last visit she reports she is overall well controlled. Her shortness of breath is stable. She is able to perform activities around the house but she does not go upstairs. Improved intermittent cough. No wheezing. Uses albuterol once a week.  Social History: Never smoker Her mother was diagnosed with tuberculosis Wood burning stove  Past Medical History:  Diagnosis Date   Arthritis    Asthma    history of   CAD (coronary artery disease)    Cirrhosis ([place])    CKD (chronic kidney disease)    Dyspnea    sometimes sitting.lying and on exertion   Emphysema lung ([facility])    GERD (gastroesophageal reflux disease)    History of hiatal hernia    History of transcatheter aortic valve replacement (TAVR) 01/26/2019   (TAVR  23 mm Evolute Pro in aortic position 01/26/2019) .    Hyperlipidemia    Hypertension    Hypothyroidism    Lipoma    left arm   Nasal polyps    Pneumonia    Pulmonary nodule    noted on  pre TAVR CT   Samter's triad    Severe aortic stenosis      Allergies  Allergen Reactions   Aspirin Anaphylaxis   Bee Venom Anaphylaxis   Ceclor [Cefaclor] Anaphylaxis   Pneumococcal Vac Polyvalent     Other reaction(s): swelling and erythema   Statins Other (See Comments)    Elevated liver enzymes   Crestor [Rosuvastatin] Rash   Sulfa Antibiotics Rash     Outpatient Medications Prior to Visit  Medication Sig Dispense Refill   albuterol (VENTOLIN HFA) 108 (90 Base) MCG/ACT inhaler Inhale 2 puffs into the lungs every 6 (six) hours as needed for shortness of  breath (use as needed or before activity for shortness of breath or whezzing). 8 g 6   Biotin 5 MG CAPS Take 500 mg by mouth daily.     calcium-vitamin D (OSCAL WITH D) 250-125 MG-UNIT tablet Take 1 tablet by mouth daily.     clopidogrel (PLAVIX) 75 MG tablet TAKE 1 TABLET BY MOUTH EVERY DAY 90 tablet 1   diphenhydrAMINE (BENADRYL) 25 mg capsule Take 25 mg by mouth every 6 (six) hours as needed.     ferrous sulfate 325 (65 FE) MG EC tablet Take 1 tablet (325 mg total) by mouth daily with breakfast. 30 tablet 3   fluticasone (FLONASE) 50 MCG/ACT nasal spray Place into both nostrils daily.     furosemide (LASIX) 20 MG tablet Take 1 tablet (20 mg total) by mouth as needed. 90 tablet 0   hydrocortisone cream 1 % Apply 1 application topically 3 (three) times daily.     levothyroxine (SYNTHROID) 50 MCG tablet Take 50 mcg by mouth daily before breakfast.     meclizine (ANTIVERT) 25 MG tablet Take 25 mg by mouth 3 (three) times daily as needed for dizziness.     montelukast (SINGULAIR) 10 MG tablet Take 1 tablet (10 mg total) by mouth daily. (Patient taking differently: Take 10 mg by mouth at bedtime.) 30 tablet 6   Multiple Vitamin (MULTIVITAMIN WITH MINERALS) TABS tablet Take 1 tablet by mouth daily.     Peak Flow Meter DEVI 1 puff by Does not apply route in the morning and at bedtime. 1 each 0   Polyethyl Glycol-Propyl Glycol (SYSTANE) 0.4-0.3 % SOLN Place 1 drop into both eyes 3 (three) times daily as needed (dry/irritated eyes.).     potassium chloride (KLOR-CON) 10 MEQ tablet Take 1 tablet (10 mEq total) by mouth daily. 90 tablet 3   propranolol (INDERAL) 20 MG tablet TAKE 1 TABLET BY MOUTH THREE TIMES A DAY 270 tablet 1   SALINE NASAL SPRAY NA Place 1 spray into the nose daily as needed (congestion).     Tiotropium Bromide Monohydrate (SPIRIVA RESPIMAT) 2.5 MCG/ACT AERS Inhale 2 puffs into the lungs daily. 4 g 5   TURMERIC PO Take 538 mg by mouth daily.     No facility-administered medications  prior to visit.    Review of Systems  Constitutional:  Negative for chills, diaphoresis, fever, malaise/fatigue and weight loss.  HENT:  Negative for congestion.   Respiratory:  Positive for cough and shortness of breath. Negative for hemoptysis, sputum production and wheezing.   Cardiovascular:  Negative for chest pain, palpitations and leg swelling.    Objective:   Vitals:   12/28/20 0947  BP: 122/68  Pulse: 72  Temp: 98 F (36.7 C)  TempSrc: Oral  SpO2: 97%  Weight: 116 lb (52.6 kg)  Height: 5' (1.524 m)   SpO2: 97 %  O2 Device: None (Room air)  Physical Exam: General: Well-appearing, no acute distress HENT: [place], AT Eyes: EOMI, no scleral icterus Respiratory: Diminished breath sounds bilaterally.  No crackles, wheezing or rales Cardiovascular: RRR, -M/R/G, no JVD Extremities:-Edema,-tenderness Neuro: AAO x4, CNII-XII grossly intact Psych: Normal mood, normal affect  Data Reviewed:  Imaging: CT Chest 12/23/19 - S/p SVR. Tortuous thoracic aorta. Mild centrilobular emphysema. Large hiatal hernia. Stable pulmonary nodules including RML 7x5 mm and LLL ~4.5 mm, unchanged from prior imaging  CT Chest 12/25/20 - Unchanged 7 mm nodule, stable subcentimeter nodules. Large hiatal hernia.   PFT: 05/05/20 FVC 1.7 (91%) FEV1 1.26 (93%) Ratio 73  TLC 84% DLCO 43% Interpretation:  Normal spirometry. No obstructive or restrictive defect  Labs: WBC 05/04/20 9.9 Absolute eos 05/04/20 200  Assessment & Plan:   Discussion: 87 year old female with asthma, emphysema on CT, hx of TB exposure who presents for follow-up. Symptoms controlled on Spiriva. Some DOE on exertion. Discussed stepping up to [facility] however she prefers to maintain current regimen  Asthma/Emphysema - stable, symptomatic on exertion --CONTINUE Spiriva 2.5 mcg TWO puffs ONCE a day. REFILL --CONTINUE Albuterol as needed for shortness of breath or wheezing --CONTINUE Singulair 10 mg daily. REFILL  If we decide to  step up in inhaler therapy - I would like to recommend Stiolto Respimat. If this is not approved, Anoro would be my next recommendation  History of TB exposure --QF-TB neg  RML lung nodule Stable subcentimeter nodules --Stable 7 mm nodule since 2020 --No further follow-up needed. Patient not interested in aggressive management  Health Maintenance Immunization History  Administered Date(s) Administered   Influenza Split 10/19/2012, 10/07/2016, 10/25/2019   Influenza Whole 10/27/2015   Influenza-Unspecified 11/08/2009, 10/18/2011, 10/10/2014, 10/27/2015, 11/05/2017   PFIZER(Purple Top)SARS-COV-2 Vaccination 02/25/2019, 03/24/2019, 11/08/2019, 06/16/2020   Pneumococcal Conjugate-13 09/21/2014, 11/24/2015   Pneumococcal Polysaccharide-23 04/29/1998, 01/21/2006, 06/05/2006, 05/10/2013   Td 06/22/2003   Tdap 09/09/2013, 10/27/2015   Zoster, Live 06/05/2006, 07/22/2017, 10/08/2017   CT Lung Screen - not indicated  No orders of the defined types were placed in this encounter.  Meds ordered this encounter  Medications   montelukast (SINGULAIR) 10 MG tablet    Sig: Take 1 tablet (10 mg total) by mouth daily.    Dispense:  90 tablet    Refill:  3   Return in about 6 months (around 06/28/2021).  I have spent a total time of 35-minutes on the day of the appointment reviewing prior documentation, coordinating care and discussing medical diagnosis and plan with the patient/family. Past medical history, allergies, medications were reviewed. Pertinent imaging, labs and tests included in this note have been reviewed and interpreted independently by me.  Bloomer, MD [facility] 12/28/2020 9:57 AM  Office Number [phone]

## 2021-01-07 ENCOUNTER — Encounter: Payer: Self-pay | Admitting: Pulmonary Disease

## 2021-01-14 ENCOUNTER — Telehealth: Payer: Self-pay | Admitting: Emergency Medicine

## 2021-01-14 MED ORDER — MOLNUPIRAVIR EUA 200MG CAPSULE: 4.0000 | ORAL_CAPSULE | Freq: Two times a day (BID) | ORAL | 0 refills | Status: AC

## 2021-01-14 NOTE — Telephone Encounter (Signed)
Fever, sleepiness. Increase in her cough. She is vaccinated. Exposed to 2 family members w COVID, now tested positive. Hasn't needed her albuterol thus far.   Molpuniravir script called in Discussed signs and sx to watch for that would prompt need to be acutely evaluated Also discussed quarantine recs.

## 2021-01-19 ENCOUNTER — Telehealth: Payer: Self-pay

## 2021-04-06 NOTE — Progress Notes (Signed)
External labs 04/03/2021: ?BUN 16, creatinine 1.27, GFR 41, sodium 134, potassium 4.5 ?TSH 2.48 ?Total cholesterol 290, HDL 74, triglycerides 87, LDL 202

## 2021-04-23 ENCOUNTER — Ambulatory Visit: Payer: Federal, State, Local not specified - PPO | Admitting: Student

## 2021-04-23 ENCOUNTER — Encounter: Payer: Self-pay | Admitting: Student

## 2021-04-23 VITALS — BP 125/69 | HR 76 | Temp 98.0°F | Resp 17 | Ht 60.0 in | Wt 114.4 lb

## 2021-04-23 DIAGNOSIS — R0609 Other forms of dyspnea: Secondary | ICD-10-CM

## 2021-04-23 DIAGNOSIS — I2510 Atherosclerotic heart disease of native coronary artery without angina pectoris: Secondary | ICD-10-CM

## 2021-04-23 DIAGNOSIS — E7800 Pure hypercholesterolemia, unspecified: Secondary | ICD-10-CM

## 2021-04-23 DIAGNOSIS — I10 Essential (primary) hypertension: Secondary | ICD-10-CM

## 2021-04-23 DIAGNOSIS — I6523 Occlusion and stenosis of bilateral carotid arteries: Secondary | ICD-10-CM

## 2021-04-23 NOTE — Progress Notes (Signed)
? ? ?Primary Physician/Referring:  Kelton Pillar, MD ? ?Patient ID: [name] [name], female    DOB: [birth year], 88 y.o.   MRN: [id number] ? ?Chief Complaint  ?Patient presents with  ? Follow-up  ?  86 MONTH  ? Hypertension  ? Coronary Artery Disease  ? HLD  ? ?HPI:   ? ?[name] [name]  is a 88 y.o. Caucasian female with history of benign positional vertigo, chronic dyspnea, chronic back pain, hypertension, mixed hyperlipidemia and  symptomatic aortic stenosis, CAD S/P PCI to LAD/D1  on 01/05/2019. Underwent elective TAVR on 01/26/2019, mild to moderate paravalvular leak. She has aspirin anaphylaxis hence only on Plavix. ? ?Patient presents for 86-month follow-up.  Since last office visit patient has undergone carotid artery surveillance which showed mild bilateral stenosis, will plan to repeat in 1 year. Patient continues to have dyspnea on exertion which is chronic and stable Her primary concern is that her legs feel tired when working around the house.  ? ?Since last office visit patient has followed up with Dr. Hubbard Hartshorn.  LFTs have improved, however he advised patient to remain off of statin therapy.  LDL is uncontrolled. ? ?Denies chest pain, palpitations, leg swelling, orthopnea, PND. ? ?Past Medical History:  ?Diagnosis Date  ? Arthritis   ? Asthma   ? history of  ? CAD (coronary artery disease)   ? Cirrhosis ([place])   ? CKD (chronic kidney disease)   ? Dyspnea   ? sometimes sitting.lying and on exertion  ? Emphysema lung ([place])   ? GERD (gastroesophageal reflux disease)   ? History of hiatal hernia   ? 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 (TAVR  23 mm Evolute Pro in aortic position 01/26/2019) .   ? Hyperlipidemia   ? Hypertension   ? Hypothyroidism   ? Lipoma   ? left arm  ? Nasal polyps   ? Pneumonia   ? Pulmonary nodule   ? noted on pre TAVR CT  ? Samter's triad   ? Severe aortic stenosis   ? ?Past Surgical History:  ?Procedure Laterality Date  ? CHOLECYSTECTOMY  02/27/04  ? CORONARY  ATHERECTOMY N/A 01/05/2019  ? Procedure: CORONARY ATHERECTOMY;  Surgeon: Adrian Prows, MD;  Location: [place] [facility];  Service: Cardiovascular;  Laterality: N/A;  ? CORONARY BALLOON ANGIOPLASTY N/A 01/05/2019  ? Procedure: CORONARY BALLOON ANGIOPLASTY;  Surgeon: Adrian Prows, MD;  Location: [place] [facility];  Service: Cardiovascular;  Laterality: N/A;  ? CORONARY STENT INTERVENTION N/A 01/05/2019  ? Procedure: CORONARY STENT INTERVENTION;  Surgeon: Adrian Prows, MD;  Location: [place] [facility];  Service: Cardiovascular;  Laterality: N/A;  ? CORONARY STENT INTERVENTION  01/05/2019  ? INCONTINENCE SURGERY  09/12/09  ? NASAL SINUS SURGERY    ? several    ? ORIF ANKLE FRACTURE Right 09/11/2013  ? Procedure: OPEN REDUCTION INTERNAL FIXATION (ORIF) ANKLE FRACTURE;  Surgeon: Augustin Schooling, MD;  Location: [facility];  Service: Orthopedics;  Laterality: Right;  ? RIGHT/LEFT HEART CATH AND CORONARY ANGIOGRAPHY N/A 12/15/2018  ? Procedure: RIGHT/LEFT HEART CATH AND CORONARY ANGIOGRAPHY;  Surgeon: Adrian Prows, MD;  Location: [place] [facility];  Service: Cardiovascular;  Laterality: N/A;  ? ROTATOR CUFF REPAIR  09/27/2003  ? SINUS ENDO WITH FUSION Bilateral 09/08/2020  ? Procedure: Bilateral Revision of Endoscopic Sinus Surgery with Fusion and Intranasal Polypectomy;  Surgeon: Jerrell Belfast, MD;  Location: [facility];  Service: ENT;  Laterality: Bilateral;  ? TRANSCATHETER AORTIC VALVE REPLACEMENT,  TRANSFEMORAL  01/26/2019  ? TRANSCATHETER AORTIC VALVE REPLACEMENT, TRANSFEMORAL N/A 01/26/2019  ? Procedure: TRANSCATHETER AORTIC VALVE REPLACEMENT, TRANSFEMORAL;  Surgeon: Burnell Blanks, MD;  Location: [place] [facility];  Service: Open Heart Surgery;  Laterality: N/A;  ? TRIGGER FINGER RELEASE  10/29/06  ? ?Family History  ?Problem Relation Age of Onset  ? Tuberculosis Mother   ? Other Father   ?     Bronchiectasis  ? Cancer Maternal Grandmother   ?     ovarian  ? Sarcoidosis Sister   ? Asthma Maternal Grandfather   ? Heart disease Brother   ?  Heart attack Brother   ? Breast cancer Daughter   ? Brain cancer Daughter   ? ?Social History  ? ?Tobacco Use  ? Smoking status: Never  ? Smokeless tobacco: Never  ?Substance Use Topics  ? Alcohol use: No  ?  ?Marital Status: Married ? ?ROS  ?Review of Systems  ?Constitutional: Negative for malaise/fatigue and weight gain.  ?Cardiovascular:  Positive for dyspnea on exertion (stable). Negative for chest pain, claudication, leg swelling, near-syncope, orthopnea, palpitations, paroxysmal nocturnal dyspnea and syncope.  ?Respiratory:  Positive for wheezing (occasional and chronic, stable).   ?Neurological:  Negative for dizziness and weakness.  ?Objective  ?Blood pressure 125/69, pulse 76, temperature 98 ?F (36.7 ?C), temperature source Temporal, resp. rate 17, height 5' (1.524 m), weight 114 lb 6.4 oz (51.9 kg), SpO2 98 %. Body mass index is 22.34 kg/m?. ?  ? ?  04/23/2021  ? 10:58 AM 04/23/2021  ? 10:45 AM 12/28/2020  ?  9:47 AM  ?Vitals with BMI  ?Height  5\' 0"  5\' 0"   ?Weight  114 lbs 6 oz 116 lbs  ?BMI  22.34 22.65  ?Systolic 573 220 254  ?Diastolic 69 83 68  ?Pulse 76 78 72  ?  ?Physical Exam ?Vitals reviewed.  ?Constitutional:   ?   General: She is not in acute distress. ?   Appearance: She is well-developed.  ?Neck:  ?   Vascular: No JVD.  ?Cardiovascular:  ?   Rate and Rhythm: Normal rate and regular rhythm.  ?   Pulses: Intact distal pulses.     ?     Carotid pulses are  on the right side with bruit and  on the left side with bruit. ?     Popliteal pulses are 2+ on the right side and 2+ on the left side.  ?     Dorsalis pedis pulses are 2+ on the right side and 1+ on the left side.  ?     Posterior tibial pulses are 2+ on the right side and 1+ on the left side.  ?   Heart sounds: S1 normal and S2 normal. Murmur heard.  ?Harsh midsystolic murmur is present with a grade of 3/6 at the upper right sternal border radiating to the neck.  ?  No gallop.  ?Pulmonary:  ?   Effort: Pulmonary effort is normal. No respiratory  distress.  ?   Breath sounds: Normal breath sounds. No wheezing, rhonchi or rales.  ?Musculoskeletal:  ?   Right lower leg: No edema.  ?   Left lower leg: No edema.  ?Neurological:  ?   Mental Status: She is alert.  ? ?Laboratory examination:  ? ?Recent Labs  ?  09/01/20 ?1139 09/07/20 ?0942 09/18/20 ?0959 11/07/20 ?1228  ?NA 135 134* 132* 141  ?K 4.2 4.0 4.3 4.0  ?CL 97* 100 96 101  ?CO2 28 26 22 27   ?  GLUCOSE 150* 152* 153* 90  ?BUN 56* 22 17 30*  ?CREATININE 1.76* 1.27* 1.22* 1.65*  ?CALCIUM 9.3 9.2 8.8 10.0  ?GFRNONAA 28* 41*  --  30*  ? ? ?  Latest Ref Rng & Units 11/07/2020  ? 12:28 PM 09/18/2020  ?  9:59 AM 09/07/2020  ?  9:42 AM  ?CMP  ?Glucose 70 - 99 mg/dL 90   153   152    ?BUN 8 - 23 mg/dL 30   17   22     ?Creatinine 0.44 - 1.00 mg/dL 1.65   1.22   1.27    ?Sodium 135 - 145 mmol/L 141   132   134    ?Potassium 3.5 - 5.1 mmol/L 4.0   4.3   4.0    ?Chloride 98 - 111 mmol/L 101   96   100    ?CO2 22 - 32 mmol/L 27   22   26     ?Calcium 8.9 - 10.3 mg/dL 10.0   8.8   9.2    ?Total Protein 6.5 - 8.1 g/dL 7.6    6.7    ?Total Bilirubin 0.3 - 1.2 mg/dL 0.7    1.1    ?Alkaline Phos 38 - 126 U/L 328    319    ?AST 15 - 41 U/L 71    104    ?ALT 0 - 44 U/L 56    133    ? ? ?  Latest Ref Rng & Units 11/07/2020  ? 12:28 PM 09/07/2020  ?  9:42 AM 09/01/2020  ? 11:39 AM  ?CBC  ?WBC 4.0 - 10.5 K/uL 6.5   8.2   13.9    ?Hemoglobin 12.0 - 15.0 g/dL 11.8   11.7   12.0    ?Hematocrit 36.0 - 46.0 % 36.1   36.6   37.3    ?Platelets 150 - 400 K/uL 224   244   288    ? ?Lipid Panel ?No results for input(s): CHOL, TRIG, LDLCALC, VLDL, HDL, CHOLHDL, LDLDIRECT in the last 8760 hours. ?  ?HEMOGLOBIN A1C ?Lab Results  ?Component Value Date  ? HGBA1C 4.9 01/21/2019  ? MPG 93.93 01/21/2019  ? ?TSH ?No results for input(s): TSH in the last 8760 hours. ? ?External Labs: ?04/03/2021: ?BUN 16, creatinine 1.27, GFR 41, sodium 134, potassium 4.5 ?TSH 2.48 ?Total cholesterol 290, HDL 74, triglycerides 87, LDL 202 ?  ?10/09/2020: ?Hgb 11.0, HCT 33.1,  MCV 92, platelet 293 ?BUN 16, creatinine 1.2, GFR 44, sodium 133, potassium four.2 ? ?Allergies  ? ?Allergies  ?Allergen Reactions  ? Aspirin Anaphylaxis  ? Bee Venom Anaphylaxis  ? Ceclor [Cefaclor] A

## 2021-04-24 LAB — BRAIN NATRIURETIC PEPTIDE: BNP: 72.2 pg/mL (ref 0.0–100.0)

## 2021-04-24 NOTE — Progress Notes (Signed)
Called and spoke with patient regarding her lab results.

## 2021-05-01 ENCOUNTER — Ambulatory Visit: Payer: Federal, State, Local not specified - PPO

## 2021-05-01 DIAGNOSIS — R202 Paresthesia of skin: Secondary | ICD-10-CM

## 2021-05-01 DIAGNOSIS — I2510 Atherosclerotic heart disease of native coronary artery without angina pectoris: Secondary | ICD-10-CM

## 2021-05-01 DIAGNOSIS — E7800 Pure hypercholesterolemia, unspecified: Secondary | ICD-10-CM

## 2021-05-07 ENCOUNTER — Other Ambulatory Visit: Payer: Self-pay | Admitting: Physician Assistant

## 2021-05-07 DIAGNOSIS — D649 Anemia, unspecified: Secondary | ICD-10-CM

## 2021-05-07 DIAGNOSIS — D508 Other iron deficiency anemias: Secondary | ICD-10-CM

## 2021-05-08 ENCOUNTER — Inpatient Hospital Stay: Payer: Federal, State, Local not specified - PPO

## 2021-05-08 ENCOUNTER — Other Ambulatory Visit: Payer: Self-pay

## 2021-05-08 ENCOUNTER — Inpatient Hospital Stay: Payer: Federal, State, Local not specified - PPO | Attending: Physician Assistant | Admitting: Physician Assistant

## 2021-05-08 VITALS — BP 115/62 | HR 66 | Temp 97.5°F | Resp 18 | Wt 113.8 lb

## 2021-05-08 DIAGNOSIS — K7460 Unspecified cirrhosis of liver: Secondary | ICD-10-CM | POA: Diagnosis not present

## 2021-05-08 DIAGNOSIS — N189 Chronic kidney disease, unspecified: Secondary | ICD-10-CM | POA: Diagnosis not present

## 2021-05-08 DIAGNOSIS — Z79899 Other long term (current) drug therapy: Secondary | ICD-10-CM | POA: Diagnosis not present

## 2021-05-08 DIAGNOSIS — D508 Other iron deficiency anemias: Secondary | ICD-10-CM

## 2021-05-08 DIAGNOSIS — D649 Anemia, unspecified: Secondary | ICD-10-CM | POA: Insufficient documentation

## 2021-05-08 LAB — CMP (CANCER CENTER ONLY)
ALT: 49 U/L — ABNORMAL HIGH (ref 0–44)
AST: 65 U/L — ABNORMAL HIGH (ref 15–41)
Albumin: 4 g/dL (ref 3.5–5.0)
Alkaline Phosphatase: 349 U/L — ABNORMAL HIGH (ref 38–126)
Anion gap: 9 (ref 5–15)
BUN: 24 mg/dL — ABNORMAL HIGH (ref 8–23)
CO2: 30 mmol/L (ref 22–32)
Calcium: 9.3 mg/dL (ref 8.9–10.3)
Chloride: 96 mmol/L — ABNORMAL LOW (ref 98–111)
Creatinine: 1.56 mg/dL — ABNORMAL HIGH (ref 0.44–1.00)
GFR, Estimated: 32 mL/min — ABNORMAL LOW (ref >60)
Glucose, Bld: 93 mg/dL (ref 70–99)
Potassium: 3.8 mmol/L (ref 3.5–5.1)
Sodium: 135 mmol/L (ref 135–145)
Total Bilirubin: 0.6 mg/dL (ref 0.3–1.2)
Total Protein: 7.8 g/dL (ref 6.5–8.1)

## 2021-05-08 LAB — IRON AND IRON BINDING CAPACITY (CC-WL,HP ONLY)
Iron: 95 ug/dL (ref 28–170)
Saturation Ratios: 26 % (ref 10.4–31.8)
TIBC: 363 ug/dL (ref 250–450)
UIBC: 268 ug/dL (ref 148–442)

## 2021-05-08 LAB — RETIC PANEL
Immature Retic Fract: 6.7 % (ref 2.3–15.9)
RBC.: 3.74 MIL/uL — ABNORMAL LOW (ref 3.87–5.11)
Retic Count, Absolute: 50.5 10*3/uL (ref 19.0–186.0)
Retic Ct Pct: 1.4 % (ref 0.4–3.1)
Reticulocyte Hemoglobin: 34.4 pg (ref >27.9)

## 2021-05-08 LAB — CBC WITH DIFFERENTIAL (CANCER CENTER ONLY)
Abs Immature Granulocytes: 0.11 10*3/uL — ABNORMAL HIGH (ref 0.00–0.07)
Basophils Absolute: 0 10*3/uL (ref 0.0–0.1)
Basophils Relative: 0 %
Eosinophils Absolute: 0.2 10*3/uL (ref 0.0–0.5)
Eosinophils Relative: 2 %
HCT: 36 % (ref 36.0–46.0)
Hemoglobin: 12.2 g/dL (ref 12.0–15.0)
Immature Granulocytes: 2 %
Lymphocytes Relative: 29 %
Lymphs Abs: 2 10*3/uL (ref 0.7–4.0)
MCH: 31.9 pg (ref 26.0–34.0)
MCHC: 33.9 g/dL (ref 30.0–36.0)
MCV: 94 fL (ref 80.0–100.0)
Monocytes Absolute: 0.6 10*3/uL (ref 0.1–1.0)
Monocytes Relative: 9 %
Neutro Abs: 4.1 10*3/uL (ref 1.7–7.7)
Neutrophils Relative %: 58 %
Platelet Count: 236 10*3/uL (ref 150–400)
RBC: 3.83 MIL/uL — ABNORMAL LOW (ref 3.87–5.11)
RDW: 13.2 % (ref 11.5–15.5)
WBC Count: 6.9 10*3/uL (ref 4.0–10.5)
nRBC: 0 % (ref 0.0–0.2)

## 2021-05-08 LAB — FERRITIN: Ferritin: 67 ng/mL (ref 11–307)

## 2021-05-08 NOTE — Progress Notes (Signed)
?[place] ?[phone]   [phone] ? ?HEMATOLOGY AND ONCOLOGY PROGRESS NOTE ? ?Patient Care Team: ?Kelton Pillar, MD as PCP - General (Family Medicine) ?Adrian Prows, MD as PCP - Cardiology (Cardiology) ?Burnell Blanks, MD as PCP - Structural Heart (Cardiology) ? ?Hematological/Oncological History ?1) Labs from PCP, Dr. Kelton Pillar, [place] [facility] ?09/21/2019: WBC 7.3, Hgb 12.3, MCV 91.2, Plt 244 ?03/27/2020: WBC 14.9 (H), Hgb 8.5 (L), MCV 91.0, Plt 339, Iron 35 (L), Saturation 7% (L), Transferrin 335, TIBC 469 (H) ?04/11/2020: WBC 16.1 (H), Hgb 9.2 (L), MCV 86.0, Plt 379 ?05/10/2020: WBC 7.6, Hgb 8.9 (L), MCV 65.2 (L), Plt 361 ? ?2) 05/30/2020: Establish care with Dede Query PA-C. Recommended to start ferrous sulfate 325 mg once daily.  ? ?CHIEF COMPLAINTS:  ?Normocytic anemia.  ? ?HISTORY OF PRESENTING ILLNESS:  ?[name] [name] 88 y.o. female returns today for follow-up from normocytic anemia.  She is unaccompanied for this visit. ? ?At today's visit, Ms. Bocek reports that her energy levels are fairly stable.  She does have fatigue but is very busy throughout the day.  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  She admits that her appetite is fair but she makes herself eat.  She predominately likes to eat vegetables and fruits but is aware she needs to eat protein.  She denies any weight loss.  She denies any nausea, vomiting or abdominal pain.  Her bowel habits are unchanged without any recurrent episodes of diarrhea or constipation.  Denies easy bruising or signs of active bleeding.  Stable shortness of breath with exertion but none at rest.  She denies any fevers, chills, night sweats, chest pain or cough.  She has no other complaints. Rest of the 10 point ROS is below. ? ?MEDICAL HISTORY:  ?Past Medical History:  ?Diagnosis Date  ? Arthritis   ? Asthma   ? history of  ? CAD (coronary artery disease)   ? Cirrhosis ([place])   ? CKD (chronic  kidney disease)   ? Dyspnea   ? sometimes sitting.lying and on exertion  ? Emphysema lung ([place])   ? GERD (gastroesophageal reflux disease)   ? History of hiatal hernia   ? 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 (TAVR  23 mm Evolute Pro in aortic position 01/26/2019) .   ? Hyperlipidemia   ? Hypertension   ? Hypothyroidism   ? Lipoma   ? left arm  ? Nasal polyps   ? Pneumonia   ? Pulmonary nodule   ? noted on pre TAVR CT  ? Samter's triad   ? Severe aortic stenosis   ? ? ?SURGICAL HISTORY: ?Past Surgical History:  ?Procedure Laterality Date  ? CHOLECYSTECTOMY  02/27/04  ? CORONARY ATHERECTOMY N/A 01/05/2019  ? Procedure: CORONARY ATHERECTOMY;  Surgeon: Adrian Prows, MD;  Location: [place] [facility];  Service: Cardiovascular;  Laterality: N/A;  ? CORONARY BALLOON ANGIOPLASTY N/A 01/05/2019  ? Procedure: CORONARY BALLOON ANGIOPLASTY;  Surgeon: Adrian Prows, MD;  Location: [place] [facility];  Service: Cardiovascular;  Laterality: N/A;  ? CORONARY STENT INTERVENTION N/A 01/05/2019  ? Procedure: CORONARY STENT INTERVENTION;  Surgeon: Adrian Prows, MD;  Location: [place] [facility];  Service: Cardiovascular;  Laterality: N/A;  ? CORONARY STENT INTERVENTION  01/05/2019  ? INCONTINENCE SURGERY  09/12/09  ? NASAL SINUS SURGERY    ? several    ? ORIF ANKLE FRACTURE Right 09/11/2013  ? Procedure: OPEN REDUCTION INTERNAL  FIXATION (ORIF) ANKLE FRACTURE;  Surgeon: Augustin Schooling, MD;  Location: [facility];  Service: Orthopedics;  Laterality: Right;  ? RIGHT/LEFT HEART CATH AND CORONARY ANGIOGRAPHY N/A 12/15/2018  ? Procedure: RIGHT/LEFT HEART CATH AND CORONARY ANGIOGRAPHY;  Surgeon: Adrian Prows, MD;  Location: [place] [facility];  Service: Cardiovascular;  Laterality: N/A;  ? ROTATOR CUFF REPAIR  09/27/2003  ? SINUS ENDO WITH FUSION Bilateral 09/08/2020  ? Procedure: Bilateral Revision of Endoscopic Sinus Surgery with Fusion and Intranasal Polypectomy;  Surgeon: Jerrell Belfast, MD;  Location: [place];  Service:  ENT;  Laterality: Bilateral;  ? TRANSCATHETER AORTIC VALVE REPLACEMENT, TRANSFEMORAL  01/26/2019  ? TRANSCATHETER AORTIC VALVE REPLACEMENT, TRANSFEMORAL N/A 01/26/2019  ? Procedure: TRANSCATHETER AORTIC VALVE REPLACEMENT, TRANSFEMORAL;  Surgeon: Burnell Blanks, MD;  Location: [place];  Service: Open Heart Surgery;  Laterality: N/A;  ? TRIGGER FINGER RELEASE  10/29/06  ? ? ?SOCIAL HISTORY: ?Social History  ? ?Socioeconomic History  ? Marital status: Married  ?  Spouse name: Not on file  ? Number of children: 3  ? Years of education: Not on file  ? Highest education level: Not on file  ?Occupational History  ? Not on file  ?Tobacco Use  ? Smoking status: Never  ? Smokeless tobacco: Never  ?Vaping Use  ? Vaping Use: Never used  ?Substance and Sexual Activity  ? Alcohol use: No  ? Drug use: No  ? Sexual activity: Not on file  ?Other Topics Concern  ? Not on file  ?Social History Narrative  ? Not on file  ? ?Social Determinants of Health  ? ?Financial Resource Strain: Not on file  ?Food Insecurity: Not on file  ?Transportation Needs: Not on file  ?Physical Activity: Not on file  ?Stress: Not on file  ?Social Connections: Not on file  ?Intimate Partner Violence: Not on file  ? ? ?FAMILY HISTORY: ?Family History  ?Problem Relation Age of Onset  ? Tuberculosis Mother   ? Other Father   ?     Bronchiectasis  ? Cancer Maternal Grandmother   ?     ovarian  ? Sarcoidosis Sister   ? Asthma Maternal Grandfather   ? Heart disease Brother   ? Heart attack Brother   ? Breast cancer Daughter   ? Brain cancer Daughter   ? ? ?ALLERGIES:  is allergic to aspirin, bee venom, ceclor [cefaclor], pneumococcal vac polyvalent, statins, crestor [rosuvastatin], and sulfa antibiotics. ? ?MEDICATIONS:  ?Current Outpatient Medications  ?Medication Sig Dispense Refill  ? albuterol (VENTOLIN HFA) 108 (90 Base) MCG/ACT inhaler Inhale 2 puffs into the lungs every 6 (six) hours as needed for shortness of breath (use as needed or before activity for  shortness of breath or whezzing). 8 g 6  ? Biotin 5 MG CAPS Take 500 mg by mouth daily.    ? calcium-vitamin D (OSCAL WITH D) 250-125 MG-UNIT tablet Take 1 tablet by mouth daily.    ? clopidogrel (PLAVIX) 75 MG tablet TAKE 1 TABLET BY MOUTH EVERY DAY 90 tablet 1  ? diphenhydrAMINE (BENADRYL) 25 mg capsule Take 25 mg by mouth every 6 (six) hours as needed.    ? ferrous sulfate 325 (65 FE) MG EC tablet Take 1 tablet (325 mg total) by mouth daily with breakfast. (Patient taking differently: Take 325 mg by mouth every other day.) 30 tablet 3  ? fluticasone (FLONASE) 50 MCG/ACT nasal spray Place into both nostrils daily.    ? furosemide (LASIX) 20 MG tablet Take 1 tablet (  20 mg total) by mouth as needed. 90 tablet 0  ? hydrocortisone cream 1 % Apply 1 application. topically 3 (three) times daily as needed.    ? levothyroxine (SYNTHROID) 50 MCG tablet Take 50 mcg by mouth as needed.    ? meclizine (ANTIVERT) 25 MG tablet Take 25 mg by mouth 3 (three) times daily as needed for dizziness.    ? Multiple Vitamin (MULTIVITAMIN WITH MINERALS) TABS tablet Take 1 tablet by mouth every other day.    ? Peak Flow Meter DEVI 1 puff by Does not apply route in the morning and at bedtime. 1 each 0  ? Polyethyl Glycol-Propyl Glycol (SYSTANE) 0.4-0.3 % SOLN Place 1 drop into both eyes 3 (three) times daily as needed (dry/irritated eyes.).    ? propranolol (INDERAL) 20 MG tablet TAKE 1 TABLET BY MOUTH THREE TIMES A DAY 270 tablet 1  ? SALINE NASAL SPRAY NA Place 1 spray into the nose daily as needed (congestion).    ? Tiotropium Bromide Monohydrate (SPIRIVA RESPIMAT) 2.5 MCG/ACT AERS Inhale 2 puffs into the lungs daily. 4 g 5  ? montelukast (SINGULAIR) 10 MG tablet Take 1 tablet (10 mg total) by mouth daily. 90 tablet 3  ? potassium chloride (KLOR-CON) 10 MEQ tablet Take 1 tablet (10 mEq total) by mouth daily. (Patient taking differently: Take 10 mEq by mouth daily. Only takes with lasix) 90 tablet 3  ? ?No current facility-administered  medications for this visit.  ? ? ?REVIEW OF SYSTEMS:   ?Constitutional: ( - ) fevers, ( - )  chills , ( - ) night sweats ?Eyes: ( - ) blurriness of vision, ( - ) double vision, ( - ) watery eyes ?Ears, nose,

## 2021-05-09 ENCOUNTER — Telehealth: Payer: Self-pay

## 2021-05-09 NOTE — Telephone Encounter (Signed)
Labs faxed to PCP Kelton Pillar, MD and [facility].  Confirmations received ?

## 2021-05-09 NOTE — Telephone Encounter (Signed)
-----   Message from Lincoln Brigham, PA-C sent at 05/08/2021  4:13 PM EDT ----- ?Can you fax the lab results to PCP and Dr. Michail Sermon from [facility] ?

## 2021-05-11 NOTE — Progress Notes (Signed)
Called and spoke to pt, pt is unsure about what she should do regarding her cholesterol due to the fact that she is not on any cholesterol medication. Her next OV with [place] is not until 10/26/2021. Please advise.

## 2021-05-14 ENCOUNTER — Ambulatory Visit: Payer: Federal, State, Local not specified - PPO | Admitting: Dietician

## 2021-05-26 ENCOUNTER — Other Ambulatory Visit: Payer: Self-pay | Admitting: Student

## 2021-05-26 DIAGNOSIS — I2510 Atherosclerotic heart disease of native coronary artery without angina pectoris: Secondary | ICD-10-CM

## 2021-06-13 ENCOUNTER — Other Ambulatory Visit: Payer: Self-pay | Admitting: Cardiology

## 2021-07-09 NOTE — Telephone Encounter (Signed)
Error

## 2021-07-19 ENCOUNTER — Telehealth: Payer: Self-pay | Admitting: Cardiology

## 2021-07-19 NOTE — Telephone Encounter (Signed)
Patient's daughter says patient has been having sob over the past 3 weeks, seems to be worsening. She says patient has refused to make an appointment because other family members have been having health issues. Patient's daughter says patient may call later today to make an appointment. If not, she's wanting to see if someone can call her and speak with her about her symptoms to make sure she is okay. She mentioned patient may need adjustment with medication.

## 2021-07-19 NOTE — Telephone Encounter (Signed)
Would you like for [place] to schedule an appointment?Please advise.

## 2021-07-20 NOTE — Telephone Encounter (Signed)
Called and spoke with patient, she will wait for appointment and said she will "take it easy" for next few days, I advised her to go to ED if symptoms worsen.

## 2021-07-20 NOTE — Telephone Encounter (Signed)
Looks like she has an appt next week, so that should be fine unless she wants to come in sooner

## 2021-07-26 ENCOUNTER — Encounter: Payer: Self-pay | Admitting: Student

## 2021-07-26 ENCOUNTER — Ambulatory Visit: Payer: Federal, State, Local not specified - PPO | Admitting: Student

## 2021-07-26 VITALS — BP 119/74 | HR 74 | Temp 97.2°F | Resp 16 | Ht 60.0 in | Wt 116.8 lb

## 2021-07-26 DIAGNOSIS — R0602 Shortness of breath: Secondary | ICD-10-CM

## 2021-07-26 DIAGNOSIS — T733XXA Exhaustion due to excessive exertion, initial encounter: Secondary | ICD-10-CM

## 2021-07-26 DIAGNOSIS — I6523 Occlusion and stenosis of bilateral carotid arteries: Secondary | ICD-10-CM

## 2021-07-26 DIAGNOSIS — I2510 Atherosclerotic heart disease of native coronary artery without angina pectoris: Secondary | ICD-10-CM

## 2021-07-26 NOTE — Progress Notes (Signed)
Primary Physician/Referring:  Kelton Pillar, MD  Patient ID: [name] [name], female    DOB: 1931/02/09, 88 y.o.   MRN: [id number]  Chief Complaint  Patient presents with   Shortness of Breath   Fatigue   HPI:    [name] [name]  is a 88 y.o. Caucasian female with history of benign positional vertigo, chronic dyspnea, chronic back pain, hypertension, mixed hyperlipidemia and  symptomatic aortic stenosis, CAD S/P PCI to LAD/D1  on 01/05/2019. Underwent elective TAVR on 01/26/2019, mild to moderate paravalvular leak. She has aspirin anaphylaxis hence only on Plavix.  Patient was last seen in the office 04/23/2021 at which time patient had opted out of additional lipid management therapy. She was complaining of dyspnea and fatigue at that time, BNP was normal., therefore no medication changes were made.  Patient now presents for urgent visit at her request with concerns of shortness of breath and fatigue continuing.  Patient reports worsening fatigue and shortness of breath over the last few months.  She also has an cough that has lasted for several months since COVID infection.  Due to elevated LFTs have remained off statin therapy. Denies chest pain, palpitations, leg swelling, orthopnea, PND.  Past Medical History:  Diagnosis Date   Arthritis    Asthma    history of   CAD (coronary artery disease)    Cirrhosis ([place])    CKD (chronic kidney disease)    Dyspnea    sometimes sitting.lying and on exertion   Emphysema lung ([facility])    GERD (gastroesophageal reflux disease)    History of hiatal hernia    History of transcatheter aortic valve replacement (TAVR) 01/26/2019   (TAVR  23 mm Evolute Pro in aortic position 01/26/2019) .    Hyperlipidemia    Hypertension    Hypothyroidism    Lipoma    left arm   Nasal polyps    Pneumonia    Pulmonary nodule    noted on pre TAVR CT   Samter's triad    Severe aortic stenosis    Past Surgical History:  Procedure Laterality Date    CHOLECYSTECTOMY  02/27/04   CORONARY ATHERECTOMY N/A 01/05/2019   Procedure: CORONARY ATHERECTOMY;  Surgeon: Adrian Prows, MD;  Location: [place] [facility];  Service: Cardiovascular;  Laterality: N/A;   CORONARY BALLOON ANGIOPLASTY N/A 01/05/2019   Procedure: CORONARY BALLOON ANGIOPLASTY;  Surgeon: Adrian Prows, MD;  Location: [place] [facility];  Service: Cardiovascular;  Laterality: N/A;   CORONARY STENT INTERVENTION N/A 01/05/2019   Procedure: CORONARY STENT INTERVENTION;  Surgeon: Adrian Prows, MD;  Location: [place];  Service: Cardiovascular;  Laterality: N/A;   CORONARY STENT INTERVENTION  01/05/2019   INCONTINENCE SURGERY  09/12/09   NASAL SINUS SURGERY     several     ORIF ANKLE FRACTURE Right 09/11/2013   Procedure: OPEN REDUCTION INTERNAL FIXATION (ORIF) ANKLE FRACTURE;  Surgeon: Augustin Schooling, MD;  Location: [facility];  Service: Orthopedics;  Laterality: Right;   RIGHT/LEFT HEART CATH AND CORONARY ANGIOGRAPHY N/A 12/15/2018   Procedure: RIGHT/LEFT HEART CATH AND CORONARY ANGIOGRAPHY;  Surgeon: Adrian Prows, MD;  Location: [place] [facility];  Service: Cardiovascular;  Laterality: N/A;   ROTATOR CUFF REPAIR  09/27/2003   SINUS ENDO WITH FUSION Bilateral 09/08/2020   Procedure: Bilateral Revision of Endoscopic Sinus Surgery with Fusion and Intranasal Polypectomy;  Surgeon: Jerrell Belfast, MD;  Location: [facility];  Service: ENT;  Laterality: Bilateral;   TRANSCATHETER AORTIC VALVE REPLACEMENT,  TRANSFEMORAL  01/26/2019   TRANSCATHETER AORTIC VALVE REPLACEMENT, TRANSFEMORAL N/A 01/26/2019   Procedure: TRANSCATHETER AORTIC VALVE REPLACEMENT, TRANSFEMORAL;  Surgeon: Burnell Blanks, MD;  Location: [place];  Service: Open Heart Surgery;  Laterality: N/A;   TRIGGER FINGER RELEASE  10/29/06   Family History  Problem Relation Age of Onset   Tuberculosis Mother    Other Father        Bronchiectasis   Sarcoidosis Sister    Dementia Sister    Heart disease Brother    Heart attack Brother     Cancer Maternal Grandmother        ovarian   Asthma Maternal Grandfather    Breast cancer Daughter    Brain cancer Daughter    Social History   Tobacco Use   Smoking status: Never   Smokeless tobacco: Never  Substance Use Topics   Alcohol use: No    Marital Status: Married  ROS  Review of Systems  Constitutional: Positive for malaise/fatigue. Negative for weight gain.  Cardiovascular:  Positive for dyspnea on exertion. Negative for chest pain, claudication, leg swelling, near-syncope, orthopnea, palpitations, paroxysmal nocturnal dyspnea and syncope.  Respiratory:  Positive for wheezing (occasional and chronic, stable).   Neurological:  Negative for dizziness and weakness.   Objective  Blood pressure 119/74, pulse 74, temperature (!) 97.2 F (36.2 C), temperature source Temporal, resp. rate 16, height 5' (1.524 m), weight 116 lb 12.8 oz (53 kg), SpO2 98 %. Body mass index is 22.81 kg/m.      07/26/2021   11:27 AM 05/08/2021    2:48 PM 04/23/2021   10:58 AM  Vitals with BMI  Height 5\' 0"     Weight 116 lbs 13 oz 113 lbs 13 oz   BMI 81.19 14.78   Systolic 295 621 308  Diastolic 74 62 69  Pulse 74 66 76    Physical Exam Vitals reviewed.  Constitutional:      Appearance: She is well-developed.  Neck:     Vascular: No JVD.  Cardiovascular:     Rate and Rhythm: Normal rate and regular rhythm.     Pulses: Intact distal pulses.          Carotid pulses are  on the right side with bruit and  on the left side with bruit.      Popliteal pulses are 2+ on the right side and 2+ on the left side.       Dorsalis pedis pulses are 2+ on the right side and 1+ on the left side.       Posterior tibial pulses are 2+ on the right side and 1+ on the left side.     Heart sounds: S1 normal and S2 normal. Murmur heard.     Harsh midsystolic murmur is present with a grade of 3/6 at the upper right sternal border radiating to the neck.     No gallop.  Pulmonary:     Effort: Pulmonary effort  is normal. No respiratory distress.     Breath sounds: Normal breath sounds. No wheezing, rhonchi or rales.  Musculoskeletal:     Right lower leg: No edema.     Left lower leg: No edema.    Laboratory examination:   Recent Labs    09/07/20 0942 09/18/20 0959 11/07/20 1228 05/08/21 1432  NA 134* 132* 141 135  K 4.0 4.3 4.0 3.8  CL 100 96 101 96*  CO2 26 22 27 30   GLUCOSE 152* 153* 90 93  BUN  22 17 30* 24*  CREATININE 1.27* 1.22* 1.65* 1.56*  CALCIUM 9.2 8.8 10.0 9.3  GFRNONAA 41*  --  30* 32*      Latest Ref Rng & Units 05/08/2021    2:32 PM 11/07/2020   12:28 PM 09/18/2020    9:59 AM  CMP  Glucose 70 - 99 mg/dL 93  90  153   BUN 8 - 23 mg/dL 24  30  17    Creatinine 0.44 - 1.00 mg/dL 1.56  1.65  1.22   Sodium 135 - 145 mmol/L 135  141  132   Potassium 3.5 - 5.1 mmol/L 3.8  4.0  4.3   Chloride 98 - 111 mmol/L 96  101  96   CO2 22 - 32 mmol/L 30  27  22    Calcium 8.9 - 10.3 mg/dL 9.3  10.0  8.8   Total Protein 6.5 - 8.1 g/dL 7.8  7.6    Total Bilirubin 0.3 - 1.2 mg/dL 0.6  0.7    Alkaline Phos 38 - 126 U/L 349  328    AST 15 - 41 U/L 65  71    ALT 0 - 44 U/L 49  56        Latest Ref Rng & Units 05/08/2021    2:32 PM 11/07/2020   12:28 PM 09/07/2020    9:42 AM  CBC  WBC 4.0 - 10.5 K/uL 6.9  6.5  8.2   Hemoglobin 12.0 - 15.0 g/dL 12.2  11.8  11.7   Hematocrit 36.0 - 46.0 % 36.0  36.1  36.6   Platelets 150 - 400 K/uL 236  224  244    Lipid Panel No results for input(s): "CHOL", "TRIG", "LDLCALC", "VLDL", "HDL", "CHOLHDL", "LDLDIRECT" in the last 8760 hours.   HEMOGLOBIN A1C Lab Results  Component Value Date   HGBA1C 4.9 01/21/2019   MPG 93.93 01/21/2019   TSH No results for input(s): "TSH" in the last 8760 hours.  External Labs: 04/03/2021: BUN 16, creatinine 1.27, GFR 41, sodium 134, potassium 4.5 TSH 2.48 Total cholesterol 290, HDL 74, triglycerides 87, LDL 202   10/09/2020: Hgb 11.0, HCT 33.1, MCV 92, platelet 293 BUN 16, creatinine 1.2, GFR 44,  sodium 133, potassium four.2  Allergies   Allergies  Allergen Reactions   Aspirin Anaphylaxis   Bee Venom Anaphylaxis   Ceclor [Cefaclor] Anaphylaxis   Pneumococcal Vac Polyvalent     Other reaction(s): swelling and erythema   Statins Other (See Comments)    Elevated liver enzymes   Crestor [Rosuvastatin] Rash   Sulfa Antibiotics Rash    Medications Prior to Visit:   Outpatient Medications Prior to Visit  Medication Sig Dispense Refill   albuterol (VENTOLIN HFA) 108 (90 Base) MCG/ACT inhaler Inhale 2 puffs into the lungs every 6 (six) hours as needed for shortness of breath (use as needed or before activity for shortness of breath or whezzing). 8 g 6   Biotin 5 MG CAPS Take 500 mg by mouth daily.     calcium-vitamin D (OSCAL WITH D) 250-125 MG-UNIT tablet Take 1 tablet by mouth daily.     clopidogrel (PLAVIX) 75 MG tablet TAKE 1 TABLET BY MOUTH EVERY DAY 90 tablet 1   ferrous sulfate 325 (65 FE) MG EC tablet Take 1 tablet (325 mg total) by mouth daily with breakfast. (Patient taking differently: Take 325 mg by mouth every other day.) 30 tablet 3   fluticasone (FLONASE) 50 MCG/ACT nasal spray Place into both nostrils daily.  furosemide (LASIX) 20 MG tablet Take 1 tablet (20 mg total) by mouth as needed. 90 tablet 0   levothyroxine (SYNTHROID) 50 MCG tablet Take 50 mcg by mouth as needed.     meclizine (ANTIVERT) 25 MG tablet Take 25 mg by mouth 3 (three) times daily as needed for dizziness.     montelukast (SINGULAIR) 10 MG tablet Take 1 tablet (10 mg total) by mouth daily. 90 tablet 3   Multiple Vitamin (MULTIVITAMIN WITH MINERALS) TABS tablet Take 1 tablet by mouth every other day.     Polyethyl Glycol-Propyl Glycol (SYSTANE) 0.4-0.3 % SOLN Place 1 drop into both eyes 3 (three) times daily as needed (dry/irritated eyes.).     potassium chloride (KLOR-CON) 10 MEQ tablet Take 1 tablet (10 mEq total) by mouth daily. (Patient taking differently: Take 10 mEq by mouth daily. Only takes  with lasix) 90 tablet 3   propranolol (INDERAL) 20 MG tablet TAKE 1 TABLET BY MOUTH THREE TIMES A DAY 270 tablet 1   SALINE NASAL SPRAY NA Place 1 spray into the nose daily as needed (congestion).     Tiotropium Bromide Monohydrate (SPIRIVA RESPIMAT) 2.5 MCG/ACT AERS Inhale 2 puffs into the lungs daily. 4 g 5   diphenhydrAMINE (BENADRYL) 25 mg capsule Take 25 mg by mouth every 6 (six) hours as needed. (Patient not taking: Reported on 07/26/2021)     hydrocortisone cream 1 % Apply 1 application. topically 3 (three) times daily as needed.     Peak Flow Meter DEVI 1 puff by Does not apply route in the morning and at bedtime. (Patient not taking: Reported on 07/26/2021) 1 each 0   Tiotropium Bromide Monohydrate (SPIRIVA RESPIMAT IN) Inhale 2 puffs into the lungs daily.     No facility-administered medications prior to visit.   Final Medications at End of Visit    Current Meds  Medication Sig   albuterol (VENTOLIN HFA) 108 (90 Base) MCG/ACT inhaler Inhale 2 puffs into the lungs every 6 (six) hours as needed for shortness of breath (use as needed or before activity for shortness of breath or whezzing).   Biotin 5 MG CAPS Take 500 mg by mouth daily.   calcium-vitamin D (OSCAL WITH D) 250-125 MG-UNIT tablet Take 1 tablet by mouth daily.   clopidogrel (PLAVIX) 75 MG tablet TAKE 1 TABLET BY MOUTH EVERY DAY   ferrous sulfate 325 (65 FE) MG EC tablet Take 1 tablet (325 mg total) by mouth daily with breakfast. (Patient taking differently: Take 325 mg by mouth every other day.)   fluticasone (FLONASE) 50 MCG/ACT nasal spray Place into both nostrils daily.   furosemide (LASIX) 20 MG tablet Take 1 tablet (20 mg total) by mouth as needed.   levothyroxine (SYNTHROID) 50 MCG tablet Take 50 mcg by mouth as needed.   meclizine (ANTIVERT) 25 MG tablet Take 25 mg by mouth 3 (three) times daily as needed for dizziness.   montelukast (SINGULAIR) 10 MG tablet Take 1 tablet (10 mg total) by mouth daily.   Multiple Vitamin  (MULTIVITAMIN WITH MINERALS) TABS tablet Take 1 tablet by mouth every other day.   Polyethyl Glycol-Propyl Glycol (SYSTANE) 0.4-0.3 % SOLN Place 1 drop into both eyes 3 (three) times daily as needed (dry/irritated eyes.).   potassium chloride (KLOR-CON) 10 MEQ tablet Take 1 tablet (10 mEq total) by mouth daily. (Patient taking differently: Take 10 mEq by mouth daily. Only takes with lasix)   propranolol (INDERAL) 20 MG tablet TAKE 1 TABLET BY MOUTH THREE TIMES  A DAY   SALINE NASAL SPRAY NA Place 1 spray into the nose daily as needed (congestion).   Tiotropium Bromide Monohydrate (SPIRIVA RESPIMAT) 2.5 MCG/ACT AERS Inhale 2 puffs into the lungs daily.    Radiology:   CT cardiac morphology 03/14/2019: 1. Well positioned 23 mm Evolut Pro valve with normal appearing leaflets. Good coaptation with no HALT/HAM. Although target diameters for inflow, waist, outflow in systole are slightly low the mechanism of worsening PVL appears to be primarily a large area of calcification posterior to the stent valve from the native non coronary cusp. This area of calcium measures 4.9 x 5.7 mm and is best seen on SA images at 7:00.  Extracardiac:  1. Right middle lobe nodule is stable from 09/12/2018. Additional follow-up CT chest without contrast in 12 months is recommended to ensure stability and exclude malignancy. This recommendation follows the consensus statement: Guidelines for Management of Small Pulmonary Nodules Detected on CT Images: From the Fleischner Society 2017; Radiology 2017; 284:228-243. 2.  Aortic atherosclerosis (ICD10-I70.0). 3. Large hiatal hernia.  Cardiac Studies:    Exercise myoview stress 04/25/2017: 1. The patient performed treadmill exercise using a Bruce protocol, completing 5 minutes. The patient completed an estimated workload of 4.88 METS, reaching 103% of the maximum predicted heart rate. Stress symptoms included dyspnea. Exercise capacity fair for age. Normal hemodynamic  response. Possible old anteroseptal infarct. Poor R wave progression. No ischemic changes on stress electrocardiogram. 2. The overall quality of the study is excellent. There is no evidence of abnormal lung activity. Stress and rest SPECT images demonstrate homogeneous tracer distribution throughout the myocardium. Gated SPECT imaging reveals normal myocardial thickening and wall motion. The left ventricular ejection fraction was normal (77%). 3. Low risk study.  Right and left heart catheterization 12/15/2018: RA 5/5, mean 5; RV 31/3, EDP 6; PA 32/14, mean 21, PA saturation 67%; PW 11/10, mean 10 mmHg.  CO 3.84, CI  2.5756. QP/QS 1.0.  Normal right heart catheterization. Mildly elevated LVEDP at 15 mmHg.  Peak aortic valve gradient of 27, mean 23.6 mmHg with calculated aortic valve area 0.76 cm.  Findings consistent with severe calcific degenerative aortic valve stenosis. Left dominant circulation, proximal LAD 80% stenosis, large D1 with ostial 90% stenosis, medium sized RI with a proximal 90% stenosis, dominant circumflex mild disease.  RCA mild disease and nondominant.   Recommendation: Patient will need to be evaluated for CABG and aortic valve replacement.  If the TAVR team feels she is a good candidate for TAVR, we could certainly consider either medical management of CAD or PCI to proximal LAD and TAVR. Patient be discharged home today with outpatient follow-up.  Patient has been referred to be evaluated by Dr. Gilford Raid. 30 mL contrast utilized.  Left Heart Catheterization 01/05/2019:  Successful orbital atherectomy followed by stenting of the proximal LAD with a 3.0 x 18 mm resolute Onyx DES and proximal segment postdilated with a 3.5 x 8 mm Sapphire [place].  Stenosis 90% to 0%.  TIMI-3 to TIMI-3 flow.  Orbital atherectomy followed by balloon angioplasty with 2.5 x 15 mm Sapphire balloon for D1, 90% to less than 15 to 20% stenosis.  TIMI-3 to TIMI-3 flow.  01/26/2019 (Dr.  Angelena Form): Transcatheter Aortic Valve Replacement - Transfemoral Approach             Medtronic Evolut Pro Plus THV (size 23 mm, model #[id number], serial # [id number])   EKG 05/05/2020: Sinus rhythm 70 bpm.  Left bundle branch block.   CTA chest  03/05/2019: 1. Well positioned 23 mm Evolut Pro valve with normal appearing leaflets. Good coaptation with no HALT/HAM. Although target diameters for inflow, waist, outflow in systole are slightly low the mechanism of worsening PVL appears to be primarily a large area of calcification posterior to the stent valve from the native non coronary cusp. This area of calcium measures 4.9 x 5.7 mm and is best seen on SA images at 7:00.   Echocardiogram 05/05/2020: 1. Left ventricular ejection fraction, by estimation, is 55 to 60%. The  left ventricle has normal function. The left ventricle has no regional  wall motion abnormalities. There is mild left ventricular hypertrophy.  Left ventricular diastolic function  could not be evaluated.   2. Right ventricular systolic function is normal. The right ventricular  size is normal. There is normal pulmonary artery systolic pressure.   3. Well seated 23 mm Evolute Pro TAVR valve. Mild paravalvular leak. Mean  PG 14 mmhg only mildly higher than prior (9 mmHg). DVR of 0.44 suggests no  significant aortic stenosis.   4. Mild mitral leaflet thickening. Trivial mitral regurgitation.   5. Mild to moderate tricuspid regurgitation. Estimated PASP 32 mmHg.   6. Mild pulmonic regurgitation.   7. Compared to previous outpatient study on 02/15/2020, no major change  noted.   PCV MYOCARDIAL PERFUSION WITH LEXISCAN 05/15/2020 Nondiagnostic ECG stress with lexiscan infusion. Resting EKG demonstrated normal sinus rhythm with left bundle branch block (IVCD). Peak EKG revealed no significant ST-T change from baseline abnormality. Myocardial perfusion is normal. Overall LV systolic function is normal without regional wall  motion abnormalities. Stress LV EF: 69%. No change from 04/25/2017. Low risk.  Carotid artery duplex 10/27/2020:  Duplex suggests stenosis in the right internal carotid artery (1-15%).    Duplex suggests stenosis in the right external carotid artery (<50%).  Duplex suggests stenosis in the left internal carotid artery (1-15%).  Antegrade right vertebral artery flow. Antegrade left vertebral artery flow.  Compared to the study done on 02/15/2020, bilateral ICA stenosis not  evident in the present study. Follow up in one year is appropriate if  clinically indicated.  EKG  07/26/2021: Sinus rhythm at a rate of 75 bpm.  Left atrial enlargement.  Left bundle branch block, no further analysis.  Compared EKG/03/2021, no significant change.  03/12/2020: Normal sinus rhythm at rate of 84 bpm, left atrial enlargement, left bundle branch block.  No further analysis. No significant change from EKG 02/28/2020  Assessment     ICD-10-CM   1. Coronary artery disease involving native coronary artery of native heart without angina pectoris  I25.10 PCV ECHOCARDIOGRAM COMPLETE    PCV MYOCARDIAL PERFUSION WITH LEXISCAN    CBC    Basic metabolic panel    Brain natriuretic peptide    2. Bilateral carotid artery stenosis  I65.23     3. Shortness of breath  R06.02 EKG 12-Lead    PCV ECHOCARDIOGRAM COMPLETE    PCV MYOCARDIAL PERFUSION WITH LEXISCAN    CBC    Basic metabolic panel    Brain natriuretic peptide    4. Fatigue due to excessive exertion, initial encounter  T73.3XXA        No orders of the defined types were placed in this encounter.  Medications Discontinued During This Encounter  Medication Reason   Tiotropium Bromide Monohydrate (SPIRIVA RESPIMAT IN)      Recommendations:   [name] [name]  is a 88 y.o. Caucasian female with history of benign positional vertigo, chronic dyspnea, chronic  back pain, hypertension, mixed hyperlipidemia and  symptomatic aortic stenosis, CAD S/P PCI to LAD/D1   on 01/05/2019. Underwent elective TAVR on 01/26/2019, mild to moderate paravalvular leak.   Patient's chronic dyspnea is likely multifactorial including underlying lung pathology as it has improved with use of Spiriva.  Significant hiatal hernia may also be contributing to patient's dyspnea on exertion.   Patient was last seen in the office 04/23/2021 at which time patient had opted out of additional lipid management therapy. She was complaining of dyspnea and fatigue at that time, BNP was normal., therefore no medication changes were made.  Patient now presents for urgent visit at her request with concerns of shortness of breath and fatigue continuing.  Given progressive shortness of breath and fatigue as well as underlying CAD shared decision was to proceed with further evaluation.  We will obtain BMP, BNP, and CBC.  We will also obtain repeat echocardiogram and Lexiscan nuclear stress test as patient is not a treadmill candidate given significant dyspnea.  Follow-up in 6 to 8 weeks, sooner if needed.   Alethia Berthold, PA-C 07/26/2021, 1:16 PM Office: [phone]

## 2021-07-27 LAB — CBC
Hematocrit: 36.7 % (ref 34.0–46.6)
Hemoglobin: 12.2 g/dL (ref 11.1–15.9)
MCH: 31 pg (ref 26.6–33.0)
MCHC: 33.2 g/dL (ref 31.5–35.7)
MCV: 93 fL (ref 79–97)
Platelets: 209 10*3/uL (ref 150–450)
RBC: 3.94 x10E6/uL (ref 3.77–5.28)
RDW: 12.5 % (ref 11.7–15.4)
WBC: 7.8 10*3/uL (ref 3.4–10.8)

## 2021-07-27 LAB — BASIC METABOLIC PANEL
BUN/Creatinine Ratio: 19 (ref 12–28)
BUN: 27 mg/dL (ref 8–27)
CO2: 25 mmol/L (ref 20–29)
Calcium: 9.4 mg/dL (ref 8.7–10.3)
Chloride: 95 mmol/L — ABNORMAL LOW (ref 96–106)
Creatinine, Ser: 1.39 mg/dL — ABNORMAL HIGH (ref 0.57–1.00)
Glucose: 91 mg/dL (ref 70–99)
Potassium: 4.7 mmol/L (ref 3.5–5.2)
Sodium: 132 mmol/L — ABNORMAL LOW (ref 134–144)
eGFR: 37 mL/min/{1.73_m2} — ABNORMAL LOW (ref >59)

## 2021-07-27 LAB — BRAIN NATRIURETIC PEPTIDE: BNP: 154.6 pg/mL — ABNORMAL HIGH (ref 0.0–100.0)

## 2021-08-03 NOTE — Progress Notes (Signed)
Called pt to inform her about her lab results. Informed her to take lasix 20mg  for 3 days and then as needed. Pt understood

## 2021-08-06 ENCOUNTER — Ambulatory Visit: Payer: Federal, State, Local not specified - PPO

## 2021-08-06 DIAGNOSIS — I2510 Atherosclerotic heart disease of native coronary artery without angina pectoris: Secondary | ICD-10-CM

## 2021-08-06 DIAGNOSIS — R0602 Shortness of breath: Secondary | ICD-10-CM

## 2021-08-17 ENCOUNTER — Encounter: Payer: Self-pay | Admitting: Pulmonary Disease

## 2021-08-17 ENCOUNTER — Ambulatory Visit: Payer: Federal, State, Local not specified - PPO | Admitting: Pulmonary Disease

## 2021-08-17 VITALS — BP 114/62 | HR 74 | Ht 59.75 in | Wt 116.4 lb

## 2021-08-17 DIAGNOSIS — J432 Centrilobular emphysema: Secondary | ICD-10-CM | POA: Diagnosis not present

## 2021-08-17 MED ORDER — STIOLTO RESPIMAT 2.5-2.5 MCG/ACT IN AERS: 2.0000 | INHALATION_SPRAY | Freq: Every day | RESPIRATORY_TRACT | 5 refills | Status: DC

## 2021-08-17 NOTE — Patient Instructions (Signed)
  Asthma/Emphysema - worsening shortness of breath, mild cough --STOP Spiriva  --START Stiolto 2.5/2.5 mcg TWO puffs ONCE a day --CONTINUE Albuterol as needed for shortness of breath or wheezing --CONTINUE Singulair 10 mg daily  Follow-up with me in 3 months

## 2021-08-17 NOTE — Progress Notes (Unsigned)
Subjective:   PATIENT ID: [name] [name] GENDER: female DOB: 06-07-1931, MRN: [id number]   HPI  Chief Complaint  Patient presents with   Follow-up   Reason for Visit: Follow-up  [name] [name] is a 88 year old female never smoker with emphysema, CAD s/p stent, AVS s/pt TAVR 01/2019, pulmonary nodules who presents for follow-up.  Synopsis:  She was referred to Pulmonary after being discharged from the hospital in 04/2020. Per review of discharge note she required 1-2L O2 in the ED. V/Q scan was negative for PE. While inpatient she was diuresed. Cardiology was consulted and plan for outpatient nuclear stress test. She was also referred to Pulmonary to consider PFT for evaluation of her emphysema. At time of discharge she did not require any oxygen. She has been on lasix and has noticed weight gain of 10lbs post-discharge. This morning is 116lb, baseline is 113-115lb.  She reports childhood asthma and respiratory complications during pregnancy in her 20-30s. She was on a nebulizer 2-3 x daily in her 50-60s and needing steroids at least twice a year. However in her 70s-80s she has not had any issues or needing any medications for her asthma and this may have been related from removing environmental issues (older home, dust, tobacco farms, gardening)  She has had multiple sinus surgery x 4 including for nasal polyp removal. She was seen by ENT yesterday. She takes zyrtec and saline rinses. She is scheduled for stress test next week with [facility].  05/18/20 Since our last visit, she has been tolerating Spiriva and feels that it is improving her shortness of breath. Her daughter is present with her daughter and wish to review PFT results.  10/02/20 Since our last visit she was seen by ENT and underwent sinus surgery (sinus fusion and intranasal polypectomy) in Aug 2022 with path returning with pseudomonas and fungus. She was treated with a course of amoxicillin. Last ENT visit was  09/29/20 and started on nasal rinses and sprays. Not sure if this is effective yet since she recently started. She is on diuretics which she manages diligently with daily weight checks. She has been referred to a Nephrology. She tolerating the Spiriva and feels it is helping her. Uses albuterol on average once a week for shortness of breath.    12/28/20 Since our last visit she reports she is overall well controlled. Her shortness of breath is stable. She is able to perform activities around the house but she does not go upstairs. Improved intermittent cough. No wheezing. Uses albuterol once a week.  08/17/21 She reports worsening shortness of breath and has been evaluated by Cardiology. Stress test negative. She reports worsening ability to take a deep breath. Has been taking her albuterol daily more frequently. She is active at baseline. Occasional cough since her COVID infection at Christmas. Compliant with Spiriva. Daughter on the phone reports she isn't compliant with her lasix for   Social History: Never smoker Her mother was diagnosed with tuberculosis [place] burning stove  Past Medical History:  Diagnosis Date   Arthritis    Asthma    history of   CAD (coronary artery disease)    Cirrhosis ([place])    CKD (chronic kidney disease)    Dyspnea    sometimes sitting.lying and on exertion   Emphysema lung (HCC)    GERD (gastroesophageal reflux disease)    History of hiatal hernia    History of transcatheter aortic valve replacement (TAVR) 01/26/2019   (TAVR  23 mm Evolute Pro in aortic position 01/26/2019) .    Hyperlipidemia    Hypertension    Hypothyroidism    Lipoma    left arm   Nasal polyps    Pneumonia    Pulmonary nodule    noted on pre TAVR CT   Samter's triad    Severe aortic stenosis      Allergies  Allergen Reactions   Aspirin Anaphylaxis   Bee Venom Anaphylaxis   Ceclor [Cefaclor] Anaphylaxis   Pneumococcal Vac Polyvalent     Other reaction(s): swelling and  erythema   Statins Other (See Comments)    Elevated liver enzymes   Crestor [Rosuvastatin] Rash   Sulfa Antibiotics Rash     Outpatient Medications Prior to Visit  Medication Sig Dispense Refill   albuterol (VENTOLIN HFA) 108 (90 Base) MCG/ACT inhaler Inhale 2 puffs into the lungs every 6 (six) hours as needed for shortness of breath (use as needed or before activity for shortness of breath or whezzing). 8 g 6   Biotin 5 MG CAPS Take 500 mg by mouth daily.     calcium-vitamin D (OSCAL WITH D) 250-125 MG-UNIT tablet Take 1 tablet by mouth daily.     clopidogrel (PLAVIX) 75 MG tablet TAKE 1 TABLET BY MOUTH EVERY DAY 90 tablet 1   diphenhydrAMINE (BENADRYL) 25 mg capsule Take 25 mg by mouth every 6 (six) hours as needed.     ferrous sulfate 325 (65 FE) MG EC tablet Take 1 tablet (325 mg total) by mouth daily with breakfast. (Patient taking differently: Take 325 mg by mouth every other day.) 30 tablet 3   fluticasone (FLONASE) 50 MCG/ACT nasal spray Place into both nostrils daily.     furosemide (LASIX) 20 MG tablet Take 1 tablet (20 mg total) by mouth as needed. 90 tablet 0   hydrocortisone cream 1 % Apply 1 application. topically 3 (three) times daily as needed.     levothyroxine (SYNTHROID) 50 MCG tablet Take 50 mcg by mouth as needed. In am     meclizine (ANTIVERT) 25 MG tablet Take 25 mg by mouth 3 (three) times daily as needed for dizziness.     Multiple Vitamin (MULTIVITAMIN WITH MINERALS) TABS tablet Take 1 tablet by mouth every other day.     Peak Flow Meter DEVI 1 puff by Does not apply route in the morning and at bedtime. 1 each 0   Polyethyl Glycol-Propyl Glycol (SYSTANE) 0.4-0.3 % SOLN Place 1 drop into both eyes 3 (three) times daily as needed (dry/irritated eyes.).     propranolol (INDERAL) 20 MG tablet TAKE 1 TABLET BY MOUTH THREE TIMES A DAY 270 tablet 1   SALINE NASAL SPRAY NA Place 1 spray into the nose daily as needed (congestion).     Tiotropium Bromide Monohydrate (SPIRIVA  RESPIMAT) 2.5 MCG/ACT AERS Inhale 2 puffs into the lungs daily. 4 g 5   montelukast (SINGULAIR) 10 MG tablet Take 1 tablet (10 mg total) by mouth daily. 90 tablet 3   potassium chloride (KLOR-CON) 10 MEQ tablet Take 1 tablet (10 mEq total) by mouth daily. (Patient taking differently: Take 10 mEq by mouth daily. Only takes with lasix) 90 tablet 3   No facility-administered medications prior to visit.    Review of Systems  Constitutional:  Negative for chills, diaphoresis, fever, malaise/fatigue and weight loss.  HENT:  Negative for congestion.   Respiratory:  Positive for shortness of breath. Negative for cough, hemoptysis, sputum production and wheezing.  Cardiovascular:  Negative for chest pain, palpitations and leg swelling.     Objective:   Vitals:   08/17/21 1608  BP: 114/62  Pulse: 74  SpO2: 97%  Weight: 116 lb 6.4 oz (52.8 kg)  Height: 4' 11.75" (1.518 m)   SpO2: 97 % O2 Device: None (Room air)  Physical Exam: General: Elderly-appearing, no acute distress HENT: [place], AT Eyes: EOMI, no scleral icterus Respiratory: Diminished breath sounds to auscultation bilaterally.  No crackles, wheezing or rales Cardiovascular: RRR, SEM, no JVD Extremities:-Edema,-tenderness Neuro: AAO x4, CNII-XII grossly intact Psych: Normal mood, normal affect  Data Reviewed:  Imaging: CT Chest 12/23/19 - S/p SVR. Tortuous thoracic aorta. Mild centrilobular emphysema. Large hiatal hernia. Stable pulmonary nodules including RML 7x5 mm and LLL ~4.5 mm, unchanged from prior imaging  CT Chest 12/25/20 - Unchanged 7 mm nodule, stable subcentimeter nodules. Large hiatal hernia.   PFT: 05/05/20 FVC 1.7 (91%) FEV1 1.26 (93%) Ratio 73  TLC 84% DLCO 43% Interpretation:  Normal spirometry. No obstructive or restrictive defect  Labs: WBC 05/04/20 9.9 Absolute eos 05/04/20 200  Assessment & Plan:   Discussion: 88 year old female with asthma, emphysema on CT, hx of TB exposure who presents for  follow-up. Symptoms controlled on Spiriva. Some DOE on exertion. Discussed stepping up to [facility] however she prefers to maintain current regimen  Asthma/Emphysema - worsening shortness of breath, mild cough. May be related to underlying cardiac.  --STOP Spiriva  --START Stiolto 2.5/2.5 mcg TWO puffs ONCE a day --CONTINUE Albuterol as needed for shortness of breath or wheezing --CONTINUE Singulair 10 mg daily  History of TB exposure --QF-TB neg  RML lung nodule Stable subcentimeter nodules --Stable 7 mm nodule since 2020 --No further follow-up needed. Patient not interested in aggressive management  Health Maintenance Immunization History  Administered Date(s) Administered   Influenza Split 10/19/2012, 10/07/2016, 10/25/2019   Influenza Whole 10/27/2015   Influenza-Unspecified 11/08/2009, 10/18/2011, 10/10/2014, 10/27/2015, 11/05/2017   PFIZER(Purple Top)SARS-COV-2 Vaccination 02/25/2019, 03/24/2019, 11/08/2019, 06/16/2020   Pneumococcal Conjugate-13 09/21/2014, 11/24/2015   Pneumococcal Polysaccharide-23 04/29/1998, 01/21/2006, 06/05/2006, 05/10/2013   Td 06/22/2003   Tdap 09/09/2013, 10/27/2015   Zoster, Live 06/05/2006, 07/22/2017, 10/08/2017   CT Lung Screen - not indicated  No orders of the defined types were placed in this encounter.  Meds ordered this encounter  Medications   Tiotropium Bromide-Olodaterol (STIOLTO RESPIMAT) 2.5-2.5 MCG/ACT AERS    Sig: Inhale 2 puffs into the lungs daily.    Dispense:  4 g    Refill:  5   No follow-ups on file.  I have spent a total time of 45-minutes on the day of the appointment including chart review, data review, collecting history, coordinating care and discussing medical diagnosis and plan with the patient/family. Past medical history, allergies, medications were reviewed. Pertinent imaging, labs and tests included in this note have been reviewed and interpreted independently by me.  [place], MD [facility] 08/17/2021 4:39 PM  Office Number [phone]

## 2021-08-20 ENCOUNTER — Encounter: Payer: Self-pay | Admitting: Pulmonary Disease

## 2021-09-06 ENCOUNTER — Ambulatory Visit: Payer: Federal, State, Local not specified - PPO

## 2021-09-06 DIAGNOSIS — I2510 Atherosclerotic heart disease of native coronary artery without angina pectoris: Secondary | ICD-10-CM

## 2021-09-06 DIAGNOSIS — R0602 Shortness of breath: Secondary | ICD-10-CM

## 2021-10-01 ENCOUNTER — Other Ambulatory Visit: Payer: Self-pay | Admitting: Pulmonary Disease

## 2021-10-20 IMAGING — DX DG CHEST 1V PORT
1 series · 1 of 1 positions shown · non-contrast
Comparison: Chest x-ray 01/21/2019.

CLINICAL DATA: Status post trans catheter aortic valve replacement.

EXAM:
PORTABLE CHEST 1 VIEW

[chest ap]
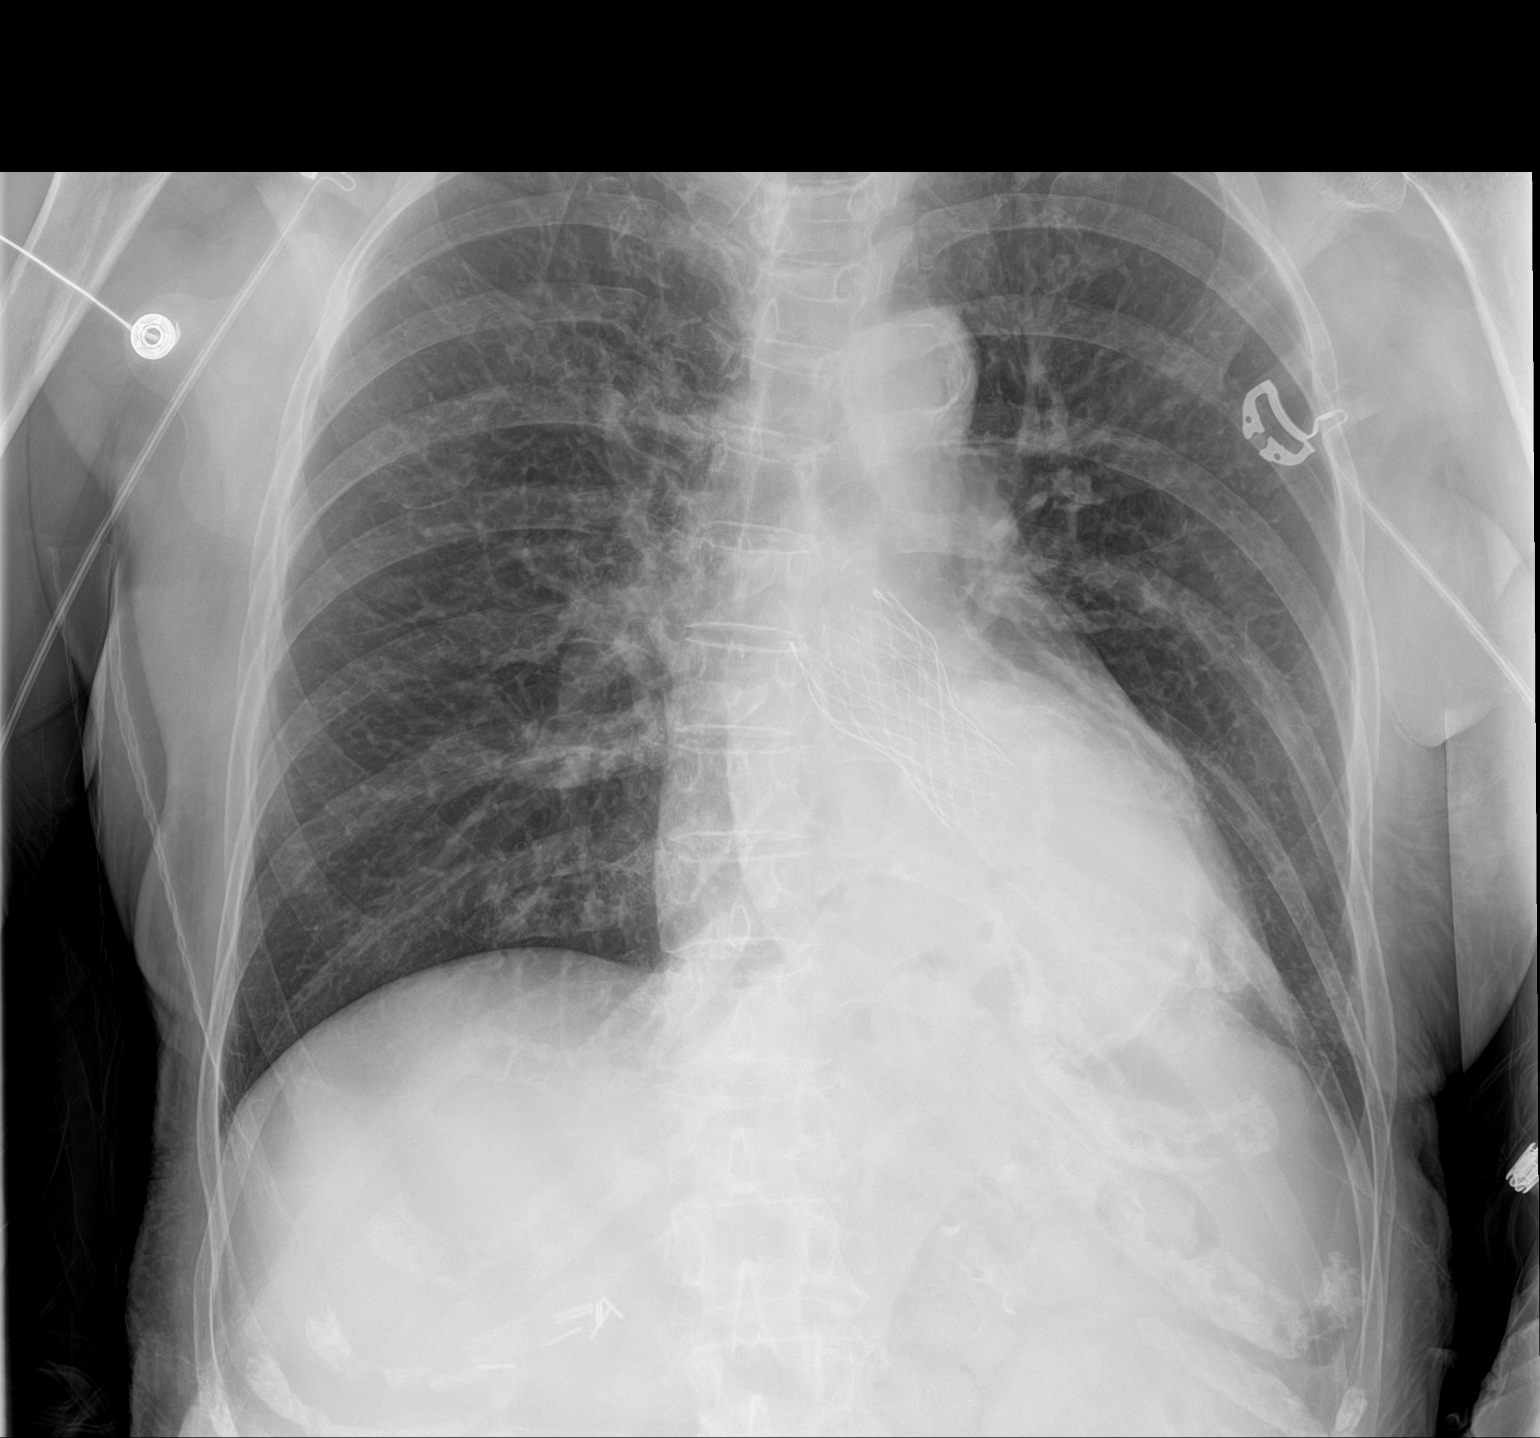

[1 of 1 positions shown; findings below may reference images not displayed]

FINDINGS: Aortic valve replacement. Heart size stable. No pulmonary venous
congestion. The lungs are clear. No pleural effusion or
pneumothorax. Large sliding hiatal hernia again noted.
IMPRESSION: 1. Aortic valve replacement. Heart size stable. No pulmonary venous
congestion.

2. No acute pulmonary disease. Large sliding hiatal hernia again
noted.

## 2021-10-22 ENCOUNTER — Ambulatory Visit: Payer: Federal, State, Local not specified - PPO | Admitting: Pulmonary Disease

## 2021-10-22 ENCOUNTER — Encounter: Payer: Self-pay | Admitting: Pulmonary Disease

## 2021-10-22 ENCOUNTER — Ambulatory Visit (INDEPENDENT_AMBULATORY_CARE_PROVIDER_SITE_OTHER): Payer: Federal, State, Local not specified - PPO

## 2021-10-22 VITALS — BP 112/60 | HR 75 | Ht 59.0 in | Wt 116.4 lb

## 2021-10-22 DIAGNOSIS — R0602 Shortness of breath: Secondary | ICD-10-CM | POA: Diagnosis not present

## 2021-10-22 NOTE — Patient Instructions (Addendum)
  Asthma/Emphysema - stable shortness of breath. Resolved cough --CONTINUE Stiolto 2.5/2.5 mcg TWO puffs ONCE a day --CONTINUE Albuterol as needed for shortness of breath or wheezing --CONTINUE Singulair 10 mg daily  Follow-up with me in 4 months

## 2021-10-22 NOTE — Progress Notes (Signed)
Subjective:   PATIENT ID: [name] [name] GENDER: female DOB: January 13, 1932, MRN: [id number]   HPI  Chief Complaint  Patient presents with   Follow-up    Not coughing as much on stiolto states not helping sob   Reason for Visit: Follow-up  [name] is a 88 year old female never smoker with emphysema, CAD s/p stent, AVS s/pt TAVR 01/2019, pulmonary nodules who presents for follow-up.  Synopsis:  She was referred to Pulmonary after being discharged from the hospital in 04/2020. Per review of discharge note she required 1-2L O2 in the ED. V/Q scan was negative for PE. While inpatient she was diuresed. Cardiology was consulted and plan for outpatient nuclear stress test. She was also referred to Pulmonary to consider PFT for evaluation of her emphysema. At time of discharge she did not require any oxygen. She has been on lasix and has noticed weight gain of 10lbs post-discharge. This morning is 116lb, baseline is 113-115lb.  She reports childhood asthma and respiratory complications during pregnancy in her 20-30s. She was on a nebulizer 2-3 x daily in her 50-60s and needing steroids at least twice a year. However in her 70s-80s she has not had any issues or needing any medications for her asthma and this may have been related from removing environmental issues (older home, dust, tobacco farms, gardening)  She has had multiple sinus surgery x 4 including for nasal polyp removal. She was seen by ENT yesterday. She takes zyrtec and saline rinses. She is scheduled for stress test next week with [facility].  05/18/20 Since our last visit, she has been tolerating Spiriva and feels that it is improving her shortness of breath. Her daughter is present with her daughter and wish to review PFT results.  10/02/20 Since our last visit she was seen by ENT and underwent sinus surgery (sinus fusion and intranasal polypectomy) in Aug 2022 with path returning with pseudomonas and fungus. She was  treated with a course of amoxicillin. Last ENT visit was 09/29/20 and started on nasal rinses and sprays. Not sure if this is effective yet since she recently started. She is on diuretics which she manages diligently with daily weight checks. She has been referred to a Nephrology. She tolerating the Spiriva and feels it is helping her. Uses albuterol on average once a week for shortness of breath.    12/28/20 Since our last visit she reports she is overall well controlled. Her shortness of breath is stable. She is able to perform activities around the house but she does not go upstairs. Improved intermittent cough. No wheezing. Uses albuterol once a week.  08/17/21 She reports worsening shortness of breath and has been evaluated by Cardiology. Stress test negative. She reports worsening ability to take a deep breath. Has been taking her albuterol daily more frequently. She is active at baseline. Occasional cough since her COVID infection at Christmas. Compliant with Spiriva. Daughter on the phone reports she isn't compliant with her lasix   10/22/21 Since our last visit she reports that she has some good days and some bad days for shortness of breath. Compliant with Stiolto which is improving cough. She is taking lasix as needed and not currently having any increased weight or leg swelling. She is using albuterol inhaler once a week but thinks she should be using it more. Not very active at baseline. Has to care for her husband and feels her plate is full.  Social History: Never smoker Her mother  was diagnosed with tuberculosis [place] burning stove  Past Medical History:  Diagnosis Date   Arthritis    Asthma    history of   CAD (coronary artery disease)    Cirrhosis ([place])    CKD (chronic kidney disease)    Dyspnea    sometimes sitting.lying and on exertion   Emphysema lung (HCC)    GERD (gastroesophageal reflux disease)    History of hiatal hernia    History of transcatheter aortic valve  replacement (TAVR) 01/26/2019   (TAVR  23 mm Evolute Pro in aortic position 01/26/2019) .    Hyperlipidemia    Hypertension    Hypothyroidism    Lipoma    left arm   Nasal polyps    Pneumonia    Pulmonary nodule    noted on pre TAVR CT   Samter's triad    Severe aortic stenosis      Allergies  Allergen Reactions   Aspirin Anaphylaxis   Bee Venom Anaphylaxis   Ceclor [Cefaclor] Anaphylaxis   Pneumococcal Vac Polyvalent     Other reaction(s): swelling and erythema   Statins Other (See Comments)    Elevated liver enzymes   Crestor [Rosuvastatin] Rash   Sulfa Antibiotics Rash     Outpatient Medications Prior to Visit  Medication Sig Dispense Refill   albuterol (VENTOLIN HFA) 108 (90 Base) MCG/ACT inhaler INHALE 2 PUFFS INTO THE LUNGS EVERY 6 (SIX) HOURS AS NEEDED FOR SHORTNESS OF BREATH (USE AS NEEDED OR BEFORE ACTIVITY FOR SHORTNESS OF BREATH OR WHEZZING) 8.5 each 6   Biotin 5 MG CAPS Take 500 mg by mouth daily.     calcium-vitamin D (OSCAL WITH D) 250-125 MG-UNIT tablet Take 1 tablet by mouth daily.     clopidogrel (PLAVIX) 75 MG tablet TAKE 1 TABLET BY MOUTH EVERY DAY 90 tablet 1   diphenhydrAMINE (BENADRYL) 25 mg capsule Take 25 mg by mouth every 6 (six) hours as needed.     ferrous sulfate 325 (65 FE) MG EC tablet Take 1 tablet (325 mg total) by mouth daily with breakfast. (Patient taking differently: Take 325 mg by mouth every other day.) 30 tablet 3   fluticasone (FLONASE) 50 MCG/ACT nasal spray Place into both nostrils daily.     furosemide (LASIX) 20 MG tablet Take 1 tablet (20 mg total) by mouth as needed. 90 tablet 0   levothyroxine (SYNTHROID) 50 MCG tablet Take 50 mcg by mouth as needed. In am     meclizine (ANTIVERT) 25 MG tablet Take 25 mg by mouth 3 (three) times daily as needed for dizziness.     Multiple Vitamin (MULTIVITAMIN WITH MINERALS) TABS tablet Take 1 tablet by mouth every other day.     Peak Flow Meter DEVI 1 puff by Does not apply route in the morning  and at bedtime. 1 each 0   Polyethyl Glycol-Propyl Glycol (SYSTANE) 0.4-0.3 % SOLN Place 1 drop into both eyes daily as needed (dry/irritated eyes.).     propranolol (INDERAL) 20 MG tablet TAKE 1 TABLET BY MOUTH THREE TIMES A DAY 270 tablet 1   SALINE NASAL SPRAY NA Place 1 spray into the nose daily as needed (congestion).     Tiotropium Bromide-Olodaterol (STIOLTO RESPIMAT) 2.5-2.5 MCG/ACT AERS Inhale 2 puffs into the lungs daily. 4 g 5   hydrocortisone cream 1 % Apply 1 application. topically 3 (three) times daily as needed.     montelukast (SINGULAIR) 10 MG tablet Take 1 tablet (10 mg total) by mouth daily. [place]  tablet 3   potassium chloride (KLOR-CON) 10 MEQ tablet Take 1 tablet (10 mEq total) by mouth daily. (Patient taking differently: Take 10 mEq by mouth daily. Only takes with lasix) 90 tablet 3   No facility-administered medications prior to visit.    Review of Systems  Constitutional:  Negative for chills, diaphoresis, fever, malaise/fatigue and weight loss.  HENT:  Negative for congestion.   Respiratory:  Positive for shortness of breath. Negative for cough, hemoptysis, sputum production and wheezing.   Cardiovascular:  Negative for chest pain, palpitations and leg swelling.     Objective:   Vitals:   10/22/21 1041  BP: 112/60  Pulse: 75  SpO2: 97%  Weight: 116 lb 6.4 oz (52.8 kg)  Height: 4\' 11"  (1.499 m)   SpO2: 97 % O2 Device: None (Room air)  Physical Exam: General: Well-appearing, no acute distress HENT: [place], AT Eyes: EOMI, no scleral icterus Respiratory: Diminished but clear to auscultation bilaterally.  No crackles, wheezing or rales Cardiovascular: RRR, -M/R/G, no JVD Extremities:-Edema,-tenderness Neuro: AAO x4, CNII-XII grossly intact Psych: Normal mood, normal affect  Data Reviewed:  Imaging: CT Chest 12/23/19 - S/p SVR. Tortuous thoracic aorta. Mild centrilobular emphysema. Large hiatal hernia. Stable pulmonary nodules including RML 7x5 mm and LLL ~4.5  mm, unchanged from prior imaging  CT Chest 12/25/20 - Unchanged 7 mm nodule, stable subcentimeter nodules. Large hiatal hernia.   CXR 10/22/21 - No acute infiltrate, effusion or edema. Large hiatal hernia  PFT: 05/05/20 FVC 1.7 (91%) FEV1 1.26 (93%) Ratio 73  TLC 84% DLCO 43% Interpretation:  Normal spirometry. No obstructive or restrictive defect  Labs: WBC 05/04/20 9.9 Absolute eos 05/04/20 200  Assessment & Plan:   Discussion: 88 year old with asthma, emphysema on CT, hx of TB exposure who presents for follow-up. Unchanged dyspnea. Cough improved on LAMA/LABA. Discussed clinical course and management of emphysema/asthma including bronchodilator regimen and action plan for exacerbation. Recommended pulmonary rehab however patient declined due to schedule.  Prior Inhalers Spiriva - good except for cough Stiolto - improved cough control  Asthma/Emphysema - stable shortness of breath. Resolved cough --CONTINUE Stiolto 2.5/2.5 mcg TWO puffs ONCE a day --CONTINUE Albuterol as needed for shortness of breath or wheezing --CONTINUE Singulair 10 mg daily  History of TB exposure --QF-TB neg  RML lung nodule Stable subcentimeter nodules --Stable 7 mm nodule since 2020 --No further follow-up needed. Patient not interested in aggressive management  Health Maintenance Immunization History  Administered Date(s) Administered   Influenza Split 10/19/2012, 10/07/2016, 10/25/2019   Influenza Whole 10/27/2015   Influenza-Unspecified 11/08/2009, 10/18/2011, 10/10/2014, 10/27/2015, 11/05/2017   PFIZER Comirnaty(Gray Top)Covid-19 Tri-Sucrose Vaccine 10/12/2021   PFIZER(Purple Top)SARS-COV-2 Vaccination 02/25/2019, 03/24/2019, 11/08/2019, 06/16/2020   Pfizer Covid-19 Vaccine Bivalent Booster 29yrs & up 10/09/2021   Pneumococcal Conjugate-13 09/21/2014, 11/24/2015   Pneumococcal Polysaccharide-23 04/29/1998, 01/21/2006, 06/05/2006, 05/10/2013   Td 06/22/2003   Tdap 09/09/2013, 10/27/2015    Zoster, Live 06/05/2006, 07/22/2017, 10/08/2017   CT Lung Screen - not indicated  Orders Placed This Encounter  Procedures   DG Chest 2 View    Standing Status:   Future    Number of Occurrences:   1    Standing Expiration Date:   10/23/2022    Order Specific Question:   Reason for Exam (SYMPTOM  OR DIAGNOSIS REQUIRED)    Answer:   shortness of breath    Order Specific Question:   Preferred imaging location?    Answer:   Internal   No orders of the  defined types were placed in this encounter.  Return in about 4 months (around 02/22/2022).  I have spent a total time of 32-minutes on the day of the appointment including chart review, data review, collecting history, coordinating care and discussing medical diagnosis and plan with the patient/family. Past medical history, allergies, medications were reviewed. Pertinent imaging, labs and tests included in this note have been reviewed and interpreted independently by me.  [place], MD [place] [facility] 10/22/2021  Office Number [phone]

## 2021-10-23 ENCOUNTER — Ambulatory Visit: Payer: Federal, State, Local not specified - PPO | Admitting: Pulmonary Disease

## 2021-10-26 ENCOUNTER — Ambulatory Visit: Payer: Federal, State, Local not specified - PPO | Admitting: Student

## 2021-10-26 ENCOUNTER — Ambulatory Visit: Payer: Federal, State, Local not specified - PPO | Admitting: Cardiology

## 2021-10-29 ENCOUNTER — Encounter: Payer: Self-pay | Admitting: Cardiology

## 2021-10-29 ENCOUNTER — Ambulatory Visit: Payer: Federal, State, Local not specified - PPO | Admitting: Cardiology

## 2021-10-29 VITALS — BP 130/68 | HR 76 | Temp 97.2°F | Resp 16 | Ht 59.0 in | Wt 116.4 lb

## 2021-10-29 DIAGNOSIS — R0602 Shortness of breath: Secondary | ICD-10-CM

## 2021-10-29 DIAGNOSIS — I2510 Atherosclerotic heart disease of native coronary artery without angina pectoris: Secondary | ICD-10-CM

## 2021-10-29 DIAGNOSIS — Z952 Presence of prosthetic heart valve: Secondary | ICD-10-CM

## 2021-10-29 DIAGNOSIS — E7800 Pure hypercholesterolemia, unspecified: Secondary | ICD-10-CM

## 2021-10-29 MED ORDER — EZETIMIBE-SIMVASTATIN 10-40 MG PO TABS: 1.0000 | ORAL_TABLET | Freq: Every day | ORAL | 2 refills | Status: DC

## 2021-10-29 NOTE — Progress Notes (Unsigned)
Primary Physician/Referring:  Mckinley Jewel, MD  Patient ID: [name] [name], female    DOB: November 05, 1931, 88 y.o.   MRN: [id number]  Chief Complaint  Patient presents with   Coronary Artery Disease   Hyperlipidemia   Follow-up    6 months   HPI:    [name] [name]  is a 88 y.o. Caucasian female with history of benign positional vertigo, chronic dyspnea, chronic back pain, hypertension, mixed hyperlipidemia, CAD S/P PCI stent to LAD and balloon angioplasty D1  on 01/05/2019, residual high-grade stenosis of RI which is moderate-sized. Underwent elective TAVR on 01/26/2019.  Patient presents for follow-up of shortness of breath.  She is also being evaluated by pulmonary medicine. Patient reports worsening fatigue and shortness of breath over the last few months.  She also has an cough that has lasted for several months since COVID infection.  Denies chest pain, palpitations, leg swelling, orthopnea, PND.  Past Medical History:  Diagnosis Date   Arthritis    Asthma    history of   CAD (coronary artery disease)    Cirrhosis ([place])    CKD (chronic kidney disease)    Dyspnea    sometimes sitting.lying and on exertion   Emphysema lung ([facility])    GERD (gastroesophageal reflux disease)    History of hiatal hernia    History of transcatheter aortic valve replacement (TAVR) 01/26/2019   (TAVR  23 mm Evolute Pro in aortic position 01/26/2019) .    Hyperlipidemia    Hypertension    Hypothyroidism    Lipoma    left arm   Nasal polyps    Pneumonia    Pulmonary nodule    noted on pre TAVR CT   Samter's triad    Severe aortic stenosis    Social History   Tobacco Use   Smoking status: Never   Smokeless tobacco: Never  Substance Use Topics   Alcohol use: No    Marital Status: Married  ROS  Review of Systems  Cardiovascular:  Positive for dyspnea on exertion. Negative for chest pain and leg swelling.   Objective  Blood pressure 130/68, pulse 76, temperature (!) 97.2 F (36.2 C),  temperature source Temporal, resp. rate 16, height 4\' 11"  (1.499 m), weight 116 lb 6.4 oz (52.8 kg), SpO2 97 %. Body mass index is 23.51 kg/m.      10/29/2021    2:22 PM 10/22/2021   10:41 AM 08/17/2021    4:08 PM  Vitals with BMI  Height 4\' 11"  4\' 11"  4' 11.75"  Weight 116 lbs 6 oz 116 lbs 6 oz 116 lbs 6 oz  BMI 23.5 37.1 69.67  Systolic 893 810 175  Diastolic 68 60 62  Pulse 76 75 74    Physical Exam Vitals reviewed.  Constitutional:      Appearance: She is well-developed.  Neck:     Vascular: No JVD.  Cardiovascular:     Rate and Rhythm: Normal rate and regular rhythm.     Pulses: Intact distal pulses.          Carotid pulses are  on the right side with bruit and  on the left side with bruit.      Popliteal pulses are 2+ on the right side and 2+ on the left side.       Dorsalis pedis pulses are 2+ on the right side and 1+ on the left side.       Posterior tibial pulses are 2+ on the  right side and 1+ on the left side.     Heart sounds: S1 normal and S2 normal. Murmur heard.     Harsh midsystolic murmur is present with a grade of 3/6 at the upper right sternal border radiating to the neck.     No gallop.  Pulmonary:     Effort: Pulmonary effort is normal. No respiratory distress.     Breath sounds: Normal breath sounds. No wheezing, rhonchi or rales.  Musculoskeletal:     Right lower leg: No edema.     Left lower leg: No edema.    Laboratory examination:   Recent Labs    11/07/20 1228 05/08/21 1432 07/26/21 1222  NA 141 135 132*  K 4.0 3.8 4.7  CL 101 96* 95*  CO2 27 30 25   GLUCOSE 90 93 91  BUN 30* 24* 27  CREATININE 1.65* 1.56* 1.39*  CALCIUM 10.0 9.3 9.4  GFRNONAA 30* 32*  --       Latest Ref Rng & Units 07/26/2021   12:22 PM 05/08/2021    2:32 PM 11/07/2020   12:28 PM  CMP  Glucose 70 - 99 mg/dL 91  93  90   BUN 8 - 27 mg/dL 27  24  30    Creatinine 0.57 - 1.00 mg/dL 1.39  1.56  1.65   Sodium 134 - 144 mmol/L 132  135  141   Potassium 3.5 - 5.2 mmol/L  4.7  3.8  4.0   Chloride 96 - 106 mmol/L 95  96  101   CO2 20 - 29 mmol/L 25  30  27    Calcium 8.7 - 10.3 mg/dL 9.4  9.3  10.0   Total Protein 6.5 - 8.1 g/dL  7.8  7.6   Total Bilirubin 0.3 - 1.2 mg/dL  0.6  0.7   Alkaline Phos 38 - 126 U/L  349  328   AST 15 - 41 U/L  65  71   ALT 0 - 44 U/L  49  56       Latest Ref Rng & Units 07/26/2021   12:22 PM 05/08/2021    2:32 PM 11/07/2020   12:28 PM  CBC  WBC 3.4 - 10.8 x10E3/uL 7.8  6.9  6.5   Hemoglobin 11.1 - 15.9 g/dL 12.2  12.2  11.8   Hematocrit 34.0 - 46.6 % 36.7  36.0  36.1   Platelets 150 - 450 x10E3/uL 209  236  224     HEMOGLOBIN A1C Lab Results  Component Value Date   HGBA1C 4.9 01/21/2019   MPG 93.93 01/21/2019   TSH No results for input(s): "TSH" in the last 8760 hours.  External Labs:   Cholesterol, total 284.000 m 10/09/2021 HDL 74.000 mg 10/09/2021 LDL 195.000 m 10/09/2021 Triglycerides 91.000 mg 10/09/2021  A1C 6.000 % 10/09/2021 TSH 2.600 10/09/2021  Hemoglobin 12.500 g/d 10/09/2021  Creatinine, Serum 1.460 mg/ 10/09/2021 Potassium 4.200 mm 10/09/2021 ALT (SGPT) 50.000 U/L 10/11/2021  Allergies   Allergies  Allergen Reactions   Aspirin Anaphylaxis   Bee Venom Anaphylaxis   Ceclor [Cefaclor] Anaphylaxis   Pneumococcal Vac Polyvalent     Other reaction(s): swelling and erythema   Statins Other (See Comments)    Elevated liver enzymes   Crestor [Rosuvastatin] Rash   Sulfa Antibiotics Rash     Final Medications at End of Visit     Current Outpatient Medications:    albuterol (VENTOLIN HFA) 108 (90 Base) MCG/ACT inhaler, INHALE 2 PUFFS INTO THE LUNGS EVERY  6 (SIX) HOURS AS NEEDED FOR SHORTNESS OF BREATH (USE AS NEEDED OR BEFORE ACTIVITY FOR SHORTNESS OF BREATH OR WHEZZING), Disp: 8.5 each, Rfl: 6   Biotin 5 MG CAPS, Take 500 mg by mouth daily., Disp: , Rfl:    calcium-vitamin D (OSCAL WITH D) 250-125 MG-UNIT tablet, Take 1 tablet by mouth daily., Disp: , Rfl:    clopidogrel (PLAVIX) 75 MG tablet, TAKE  1 TABLET BY MOUTH EVERY DAY, Disp: 90 tablet, Rfl: 1   diphenhydrAMINE (BENADRYL) 25 mg capsule, Take 25 mg by mouth every 6 (six) hours as needed., Disp: , Rfl:    ezetimibe-simvastatin (VYTORIN) 10-40 MG tablet, Take 1 tablet by mouth daily at 6 PM., Disp: 30 tablet, Rfl: 2   ferrous sulfate 325 (65 FE) MG EC tablet, Take 1 tablet (325 mg total) by mouth daily with breakfast. (Patient taking differently: Take 325 mg by mouth every other day.), Disp: 30 tablet, Rfl: 3   fluticasone (FLONASE) 50 MCG/ACT nasal spray, Place into both nostrils daily., Disp: , Rfl:    furosemide (LASIX) 20 MG tablet, Take 1 tablet (20 mg total) by mouth as needed., Disp: 90 tablet, Rfl: 0   levothyroxine (SYNTHROID) 50 MCG tablet, Take 50 mcg by mouth as needed. In am, Disp: , Rfl:    montelukast (SINGULAIR) 10 MG tablet, Take 1 tablet (10 mg total) by mouth daily., Disp: 90 tablet, Rfl: 3   Multiple Vitamin (MULTIVITAMIN WITH MINERALS) TABS tablet, Take 1 tablet by mouth every other day., Disp: , Rfl:    Peak Flow Meter DEVI, 1 puff by Does not apply route in the morning and at bedtime., Disp: 1 each, Rfl: 0   Polyethyl Glycol-Propyl Glycol (SYSTANE) 0.4-0.3 % SOLN, Place 1 drop into both eyes daily as needed (dry/irritated eyes.)., Disp: , Rfl:    potassium chloride (KLOR-CON) 10 MEQ tablet, Take 1 tablet (10 mEq total) by mouth daily. (Patient taking differently: Take 10 mEq by mouth daily. Only takes with lasix), Disp: 90 tablet, Rfl: 3   propranolol (INDERAL) 20 MG tablet, TAKE 1 TABLET BY MOUTH THREE TIMES A DAY (Patient taking differently: Take 20 mg by mouth 2 (two) times daily.), Disp: 270 tablet, Rfl: 1   SALINE NASAL SPRAY NA, Place 1 spray into the nose daily., Disp: , Rfl:    Tiotropium Bromide-Olodaterol (STIOLTO RESPIMAT) 2.5-2.5 MCG/ACT AERS, Inhale 2 puffs into the lungs daily., Disp: 4 g, Rfl: 5   meclizine (ANTIVERT) 25 MG tablet, Take 25 mg by mouth 3 (three) times daily as needed for dizziness.  (Patient not taking: Reported on 10/29/2021), Disp: , Rfl:     Radiology:   CT cardiac morphology 03/14/2019: 1. Well positioned 23 mm Evolut Pro valve with normal appearing leaflets. Good coaptation with no HALT/HAM. Although target diameters for inflow, waist, outflow in systole are slightly low the mechanism of worsening PVL appears to be primarily a large area of calcification posterior to the stent valve from the native non coronary cusp. This area of calcium measures 4.9 x 5.7 mm and is best seen on SA images at 7:00.  Extracardiac:  1. Right middle lobe nodule is stable from 09/12/2018. Additional follow-up CT chest without contrast in 12 months is recommended to ensure stability and exclude malignancy. This recommendation follows the consensus statement: Guidelines for Management of Small Pulmonary Nodules Detected on CT Images: From the Fleischner Society 2017; Radiology 2017; 284:228-243. 2.  Aortic atherosclerosis (ICD10-I70.0). 3. Large hiatal hernia.  CXR 10/22/21 - No acute infiltrate,  effusion or edema. Large hiatal hernia  Cardiac Studies:   Left Heart Catheterization 01/05/2019:  Successful orbital atherectomy followed by stenting of the proximal LAD with a 3.0 x 18 mm resolute Onyx DES and proximal segment postdilated with a 3.5 x 8 mm Sapphire [place].  Stenosis 90% to 0%.  TIMI-3 to TIMI-3 flow.  Orbital atherectomy followed by balloon angioplasty with 2.5 x 15 mm Sapphire balloon for D1, 90% to less than 15 to 20% stenosis.  TIMI-3 to TIMI-3 flow.   01/26/2019 (Dr. Angelena Form): Transcatheter Aortic Valve Replacement - Transfemoral Approach             Medtronic Evolut Pro Plus THV (size 23 mm, model #[id number], serial # [id number])   Carotid artery duplex 10/27/2020:  Duplex suggests stenosis in the right internal carotid artery (1-15%).    Duplex suggests stenosis in the right external carotid artery (<50%).  Duplex suggests stenosis in the left internal carotid artery  (1-15%).  Antegrade right vertebral artery flow. Antegrade left vertebral artery flow.  Compared to the study done on 02/15/2020, bilateral ICA stenosis not  evident in the present study. Follow up in one year is appropriate if  clinically indicated.  PCV ECHOCARDIOGRAM COMPLETE 09/06/2021  Narrative Echocardiogram 09/06/2021: Normal LV systolic function with visual EF 55-60%. Left ventricle cavity is normal in size. Mild concentric hypertrophy of the left ventricle. Normal global wall motion. Doppler evidence of grade I (impaired) diastolic dysfunction, normal LAP. Calculated EF 55%. Bioprosthetic trileaflet aortic valve.  Mild (Grade I) aortic regurgitation. Trace mitral regurgitation. Severe mitral valve leaflet calcification. Moderately restricted mitral valve leaflets. Mild mitral valve stenosis. Structurally normal tricuspid valve.  Moderate tricuspid regurgitation. No evidence of pulmonary hypertension. Structurally normal pulmonic valve.  Mild pulmonic regurgitation.   PCV MYOCARDIAL PERFUSION WITH LEXISCAN 08/06/2021  Narrative Lexiscan Nuclear stress test 08/06/2021: Nondiagnostic ECG stress. The heart rate response was consistent with Regadenoson. Myocardial perfusion is normal. Overall LV systolic function is normal without regional wall motion abnormalities. Stress LV EF: 60%. Compared to the study done on 05/15/2020, no significant change.  Low risk.   EKG   EKG 10/29/2021: Normal sinus rhythm at rate of 76 bpm, left bundle branch block.  No further analysis.  No significant change from 07/26/2021.    Assessment     ICD-10-CM   1. Coronary artery disease involving native coronary artery of native heart without angina pectoris  I25.10 EKG 12-Lead    2. S/P TAVR (transcatheter aortic valve replacement)  Z95.2     3. Hypercholesteremia  E78.00 ezetimibe-simvastatin (VYTORIN) 10-40 MG tablet    Lipid Panel With LDL/HDL Ratio    Hepatic function panel    4. Shortness of  breath  R06.02       Meds ordered this encounter  Medications   ezetimibe-simvastatin (VYTORIN) 10-40 MG tablet    Sig: Take 1 tablet by mouth daily at 6 PM.    Dispense:  30 tablet    Refill:  2   There are no discontinued medications.   Recommendations:   [name] [name]  is a 88 y.o. Caucasian female with history of benign positional vertigo, chronic dyspnea, chronic back pain, hypertension, mixed hyperlipidemia, CAD S/P PCI stent to LAD and balloon angioplasty D1  on 01/05/2019, residual high-grade stenosis of RI which is moderate-sized. Underwent elective TAVR on 01/26/2019.  Patient presents for follow-up of shortness of breath.  She is also being evaluated by pulmonary medicine.  Patient's chronic dyspnea is likely multifactorial including underlying  lung pathology/COPD/bronchiectasis, very large hiatal hernia and deconditioning.  Echocardiogram reviewed, nuclear stress test reviewed, no evidence of ischemia and LVEF is normal, very mild mitral stenosis and TAVR with very mild aortic regurgitation.  Do not think her symptoms are related to heart failure.  .  She is presently angina free, no change in physical exam or underlying left bundle branch block on EKG.  Reviewed her labs, LDL is markedly elevated.  Previously she has not been able to tolerate statins, however she is willing to try simvastatin/ezetimibe combination, 40/10 mg started today and will check lipids in 6 weeks.  Previously she has also had mild elevation in LFTs.  We will also do LFTs in neck 6 weeks.  Suspect she will need PCSK9 inhibitors to get her LDL to goal.  She is presently 88 years of age but could still consider given her fairly active lifestyle and otherwise relatively good health overall.  I reviewed her echocardiogram and also nuclear stress test and reassured her.  Advised her to increase her physical activity as tolerated.  I will see her back in 6 months for follow-up.    Adrian Prows, PA-C 10/30/2021, 1:48  PM Office: [phone]

## 2021-11-08 ENCOUNTER — Inpatient Hospital Stay: Payer: Federal, State, Local not specified - PPO

## 2021-11-08 ENCOUNTER — Inpatient Hospital Stay: Payer: Federal, State, Local not specified - PPO | Admitting: Hematology and Oncology

## 2021-11-23 ENCOUNTER — Inpatient Hospital Stay (HOSPITAL_BASED_OUTPATIENT_CLINIC_OR_DEPARTMENT_OTHER): Payer: Federal, State, Local not specified - PPO | Admitting: Hematology and Oncology

## 2021-11-23 ENCOUNTER — Other Ambulatory Visit: Payer: Self-pay

## 2021-11-23 ENCOUNTER — Other Ambulatory Visit: Payer: Self-pay | Admitting: Hematology and Oncology

## 2021-11-23 ENCOUNTER — Inpatient Hospital Stay: Payer: Federal, State, Local not specified - PPO | Attending: Hematology and Oncology

## 2021-11-23 VITALS — BP 128/72 | HR 69 | Temp 97.5°F | Resp 14 | Wt 115.9 lb

## 2021-11-23 DIAGNOSIS — D508 Other iron deficiency anemias: Secondary | ICD-10-CM

## 2021-11-23 DIAGNOSIS — D649 Anemia, unspecified: Secondary | ICD-10-CM | POA: Insufficient documentation

## 2021-11-23 DIAGNOSIS — K7460 Unspecified cirrhosis of liver: Secondary | ICD-10-CM | POA: Diagnosis not present

## 2021-11-23 DIAGNOSIS — N189 Chronic kidney disease, unspecified: Secondary | ICD-10-CM | POA: Diagnosis not present

## 2021-11-23 LAB — CMP (CANCER CENTER ONLY)
ALT: 43 U/L (ref 0–44)
AST: 75 U/L — ABNORMAL HIGH (ref 15–41)
Albumin: 4 g/dL (ref 3.5–5.0)
Alkaline Phosphatase: 322 U/L — ABNORMAL HIGH (ref 38–126)
Anion gap: 7 (ref 5–15)
BUN: 20 mg/dL (ref 8–23)
CO2: 30 mmol/L (ref 22–32)
Calcium: 9.3 mg/dL (ref 8.9–10.3)
Chloride: 99 mmol/L (ref 98–111)
Creatinine: 1.52 mg/dL — ABNORMAL HIGH (ref 0.44–1.00)
GFR, Estimated: 33 mL/min — ABNORMAL LOW (ref >60)
Glucose, Bld: 122 mg/dL — ABNORMAL HIGH (ref 70–99)
Potassium: 4 mmol/L (ref 3.5–5.1)
Sodium: 136 mmol/L (ref 135–145)
Total Bilirubin: 0.6 mg/dL (ref 0.3–1.2)
Total Protein: 7.9 g/dL (ref 6.5–8.1)

## 2021-11-23 LAB — CBC WITH DIFFERENTIAL (CANCER CENTER ONLY)
Abs Immature Granulocytes: 0.07 10*3/uL (ref 0.00–0.07)
Basophils Absolute: 0.1 10*3/uL (ref 0.0–0.1)
Basophils Relative: 1 %
Eosinophils Absolute: 0.1 10*3/uL (ref 0.0–0.5)
Eosinophils Relative: 2 %
HCT: 36.7 % (ref 36.0–46.0)
Hemoglobin: 12.1 g/dL (ref 12.0–15.0)
Immature Granulocytes: 1 %
Lymphocytes Relative: 30 %
Lymphs Abs: 2.1 10*3/uL (ref 0.7–4.0)
MCH: 31.8 pg (ref 26.0–34.0)
MCHC: 33 g/dL (ref 30.0–36.0)
MCV: 96.3 fL (ref 80.0–100.0)
Monocytes Absolute: 0.6 10*3/uL (ref 0.1–1.0)
Monocytes Relative: 8 %
Neutro Abs: 4.3 10*3/uL (ref 1.7–7.7)
Neutrophils Relative %: 58 %
Platelet Count: 216 10*3/uL (ref 150–400)
RBC: 3.81 MIL/uL — ABNORMAL LOW (ref 3.87–5.11)
RDW: 13.2 % (ref 11.5–15.5)
WBC Count: 7.2 10*3/uL (ref 4.0–10.5)
nRBC: 0 % (ref 0.0–0.2)

## 2021-11-23 LAB — RETIC PANEL
Immature Retic Fract: 8.2 % (ref 2.3–15.9)
RBC.: 3.72 MIL/uL — ABNORMAL LOW (ref 3.87–5.11)
Retic Count, Absolute: 61.8 10*3/uL (ref 19.0–186.0)
Retic Ct Pct: 1.7 % (ref 0.4–3.1)
Reticulocyte Hemoglobin: 34.7 pg (ref >27.9)

## 2021-11-23 LAB — IRON AND IRON BINDING CAPACITY (CC-WL,HP ONLY)
Iron: 83 ug/dL (ref 28–170)
Saturation Ratios: 24 % (ref 10.4–31.8)
TIBC: 347 ug/dL (ref 250–450)
UIBC: 264 ug/dL (ref 148–442)

## 2021-11-23 LAB — FERRITIN: Ferritin: 119 ng/mL (ref 11–307)

## 2021-11-23 NOTE — Progress Notes (Signed)
[place] [phone]   [phone] [place] NOTE  Patient Care Team: Mckinley Jewel, MD as PCP - General (Internal Medicine) Adrian Prows, MD as PCP - Cardiology (Cardiology) Burnell Blanks, MD as PCP - Structural Heart (Cardiology)  Hematological/Oncological History 1) Labs from PCP, Dr. Kelton Pillar, [place] [facility] 09/21/2019: WBC 7.3, Hgb 12.3, MCV 91.2, Plt 244 03/27/2020: WBC 14.9 (H), Hgb 8.5 (L), MCV 91.0, Plt 339, Iron 35 (L), Saturation 7% (L), Transferrin 335, TIBC 469 (H) 04/11/2020: WBC 16.1 (H), Hgb 9.2 (L), MCV 86.0, Plt 379 05/10/2020: WBC 7.6, Hgb 8.9 (L), MCV 65.2 (L), Plt 361  2) 05/30/2020: Establish care with Dede Query PA-C. Recommended to start ferrous sulfate 325 mg once daily.   CHIEF COMPLAINTS:  Normocytic anemia.   HISTORY OF PRESENTING ILLNESS:  [name] [name] 88 y.o. female returns today for follow-up from normocytic anemia.  She is unaccompanied for this visit.  At today's visit, [name] [name] reports she continues to follow with multiple specialist nephrology, pulmonology, and cardiology.  She also notes that she continues to take her iron pills but is not taking them every day.  She is drinking protein shakes approximately 3-4 of these per week.  She notes that she is not having any issues with bleeding, bruising, or dark stools.  She is also not having any nausea, vomiting, or diarrhea.  She has been struggling with some occasional bouts of constipation.  She notes her weight is been steady and that she is "not a big eater".  She notes that she does not care for meats and does not eat much in the way of red meat, preferring chicken.  She notes otherwise her energy levels are stable and she is at her baseline level of health.  She denies any fevers, chills, night sweats, chest pain or cough.  She has no other complaints. Rest of the 10 point ROS is below.  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    Hyperlipidemia    Hypertension    Hypothyroidism    Lipoma    left arm   Nasal polyps    Pneumonia    Pulmonary nodule    noted on pre TAVR CT   Samter's triad    Severe aortic stenosis     SURGICAL HISTORY: Past Surgical History:  Procedure Laterality Date   CHOLECYSTECTOMY  02/27/04   CORONARY ATHERECTOMY N/A 01/05/2019   Procedure: CORONARY ATHERECTOMY;  Surgeon: Adrian Prows, MD;  Location: [place] [facility];  Service: Cardiovascular;  Laterality: N/A;   CORONARY BALLOON ANGIOPLASTY N/A 01/05/2019   Procedure: CORONARY BALLOON ANGIOPLASTY;  Surgeon: Adrian Prows, MD;  Location: [place] [facility];  Service: Cardiovascular;  Laterality: N/A;   CORONARY STENT INTERVENTION N/A 01/05/2019   Procedure: CORONARY STENT INTERVENTION;  Surgeon: Adrian Prows, MD;  Location: [place] [facility];  Service: Cardiovascular;  Laterality: N/A;   CORONARY STENT INTERVENTION  01/05/2019   INCONTINENCE  SURGERY  09/12/09   NASAL SINUS SURGERY     several     ORIF ANKLE FRACTURE Right 09/11/2013   Procedure: OPEN REDUCTION INTERNAL FIXATION (ORIF) ANKLE FRACTURE;  Surgeon: Augustin Schooling, MD;  Location: [facility];  Service: Orthopedics;  Laterality: Right;   RIGHT/LEFT HEART CATH AND CORONARY ANGIOGRAPHY N/A 12/15/2018   Procedure: RIGHT/LEFT HEART CATH AND CORONARY ANGIOGRAPHY;  Surgeon: Adrian Prows, MD;  Location: [place] [facility];  Service: Cardiovascular;  Laterality: N/A;   ROTATOR CUFF REPAIR  09/27/2003   SINUS ENDO WITH FUSION Bilateral 09/08/2020   Procedure: Bilateral  Revision of Endoscopic Sinus Surgery with Fusion and Intranasal Polypectomy;  Surgeon: Jerrell Belfast, MD;  Location: [place];  Service: ENT;  Laterality: Bilateral;   TRANSCATHETER AORTIC VALVE REPLACEMENT, TRANSFEMORAL  01/26/2019   TRANSCATHETER AORTIC VALVE REPLACEMENT, TRANSFEMORAL N/A 01/26/2019   Procedure: TRANSCATHETER AORTIC VALVE REPLACEMENT, TRANSFEMORAL;  Surgeon: Burnell Blanks, MD;  Location: [place];  Service: Open Heart Surgery;  Laterality: N/A;   TRIGGER FINGER RELEASE  10/29/06    SOCIAL HISTORY: Social History   Socioeconomic History   Marital status: Married    Spouse name: Not on file   Number of children: 3   Years of education: Not on file   Highest education level: Not on file  Occupational History   Not on file  Tobacco Use   Smoking status: Never   Smokeless tobacco: Never  Vaping Use   Vaping Use: Never used  Substance and Sexual Activity   Alcohol use: No   Drug use: No   Sexual activity: Not on file  Other Topics Concern   Not on file  Social History Narrative   Not on file   Social Determinants of Health   Financial Resource Strain: Not on file  Food Insecurity: Not on file  Transportation Needs: Not on file  Physical Activity: Not on file  Stress: Not on file  Social Connections: Not on file  Intimate Partner Violence: Not on file    FAMILY HISTORY: Family History  Problem Relation Age of Onset   Tuberculosis Mother    Other Father        Bronchiectasis   Sarcoidosis Sister    Dementia Sister    Heart disease Brother    Heart attack Brother    Cancer Maternal Grandmother        ovarian   Asthma Maternal Grandfather    Breast cancer Daughter    Brain cancer Daughter     ALLERGIES:  is allergic to aspirin, bee venom, ceclor [cefaclor], pneumococcal vac polyvalent, statins, crestor [rosuvastatin], and sulfa antibiotics.  MEDICATIONS:  Current Outpatient Medications  Medication Sig Dispense Refill   albuterol (VENTOLIN  HFA) 108 (90 Base) MCG/ACT inhaler INHALE 2 PUFFS INTO THE LUNGS EVERY 6 (SIX) HOURS AS NEEDED FOR SHORTNESS OF BREATH (USE AS NEEDED OR BEFORE ACTIVITY FOR SHORTNESS OF BREATH OR WHEZZING) 8.5 each 6   Biotin 5 MG CAPS Take 500 mg by mouth daily.     calcium-vitamin D (OSCAL WITH D) 250-125 MG-UNIT tablet Take 1 tablet by mouth daily.     clopidogrel (PLAVIX) 75 MG tablet TAKE 1 TABLET BY MOUTH EVERY DAY 90 tablet 1   diphenhydrAMINE (BENADRYL) 25 mg capsule Take 25 mg by mouth every 6 (six) hours as needed.     ezetimibe-simvastatin (VYTORIN) 10-40 MG tablet Take 1 tablet by mouth daily at 6 PM. 30 tablet 2   ferrous sulfate 325 (65 FE) MG  EC tablet Take 1 tablet (325 mg total) by mouth daily with breakfast. (Patient taking differently: Take 325 mg by mouth every other day.) 30 tablet 3   fluticasone (FLONASE) 50 MCG/ACT nasal spray Place into both nostrils daily.     furosemide (LASIX) 20 MG tablet Take 1 tablet (20 mg total) by mouth as needed. 90 tablet 0   levothyroxine (SYNTHROID) 50 MCG tablet Take 50 mcg by mouth as needed. In am     meclizine (ANTIVERT) 25 MG tablet Take 25 mg by mouth 3 (three) times daily as needed for dizziness. (Patient not taking: Reported on 10/29/2021)     montelukast (SINGULAIR) 10 MG tablet Take 1 tablet (10 mg total) by mouth daily. 90 tablet 3   Multiple Vitamin (MULTIVITAMIN WITH MINERALS) TABS tablet Take 1 tablet by mouth every other day.     Peak Flow Meter DEVI 1 puff by Does not apply route in the morning and at bedtime. 1 each 0   Polyethyl Glycol-Propyl Glycol (SYSTANE) 0.4-0.3 % SOLN Place 1 drop into both eyes daily as needed (dry/irritated eyes.).     potassium chloride (KLOR-CON) 10 MEQ tablet Take 1 tablet (10 mEq total) by mouth daily. (Patient taking differently: Take 10 mEq by mouth daily. Only takes with lasix) 90 tablet 3   propranolol (INDERAL) 20 MG tablet TAKE 1 TABLET BY MOUTH THREE TIMES A DAY (Patient taking differently: Take 20 mg by mouth  2 (two) times daily.) 270 tablet 1   SALINE NASAL SPRAY NA Place 1 spray into the nose daily.     Tiotropium Bromide-Olodaterol (STIOLTO RESPIMAT) 2.5-2.5 MCG/ACT AERS Inhale 2 puffs into the lungs daily. 4 g 5   No current facility-administered medications for this visit.    REVIEW OF SYSTEMS:   Constitutional: ( - ) fevers, ( - )  chills , ( - ) night sweats Eyes: ( - ) blurriness of vision, ( - ) double vision, ( - ) watery eyes Ears, nose, mouth, throat, and face: ( - ) mucositis, ( - ) sore throat Respiratory: ( - ) cough, ( + ) dyspnea, ( - ) wheezes Cardiovascular: ( - ) palpitation, ( - ) chest discomfort, ( - ) lower extremity swelling Gastrointestinal:  ( - ) nausea, ( - ) heartburn, ( - ) change in bowel habits Skin: ( - ) abnormal skin rashes Lymphatics: ( - ) new lymphadenopathy, ( - ) easy bruising Neurological: ( - ) numbness, ( - ) tingling, ( - ) new weaknesses Behavioral/Psych: ( - ) mood change, ( - ) new changes  All other systems were reviewed with the patient and are negative.  PHYSICAL EXAMINATION: ECOG PERFORMANCE STATUS: 1 - Symptomatic but completely ambulatory  Vitals:   11/23/21 1447  BP: 128/72  Pulse: 69  Resp: 14  Temp: (!) 97.5 F (36.4 C)  SpO2: 98%    Filed Weights   11/23/21 1447  Weight: 115 lb 14.4 oz (52.6 kg)     GENERAL: well appearing female in NAD  SKIN: skin color, texture, turgor are normal, no rashes or significant lesions EYES: conjunctiva are pink and non-injected, sclera clear LUNGS: clear to auscultation and percussion with normal breathing effort HEART: regular rate & rhythm and no murmurs and no lower extremity edema Musculoskeletal: no cyanosis of digits and no clubbing  PSYCH: alert & oriented x 3, fluent speech NEURO: no focal motor/sensory deficits  LABORATORY DATA:  I have reviewed the data as listed    Latest  Ref Rng & Units 11/23/2021    2:35 PM 07/26/2021   12:22 PM 05/08/2021    2:32 PM  CBC  WBC 4.0 -  10.5 K/uL 7.2  7.8  6.9   Hemoglobin 12.0 - 15.0 g/dL 12.1  12.2  12.2   Hematocrit 36.0 - 46.0 % 36.7  36.7  36.0   Platelets 150 - 400 K/uL 216  209  236        Latest Ref Rng & Units 11/23/2021    2:35 PM 07/26/2021   12:22 PM 05/08/2021    2:32 PM  CMP  Glucose 70 - 99 mg/dL 122  91  93   BUN 8 - 23 mg/dL 20  27  24    Creatinine 0.44 - 1.00 mg/dL 1.52  1.39  1.56   Sodium 135 - 145 mmol/L 136  132  135   Potassium 3.5 - 5.1 mmol/L 4.0  4.7  3.8   Chloride 98 - 111 mmol/L 99  95  96   CO2 22 - 32 mmol/L 30  25  30    Calcium 8.9 - 10.3 mg/dL 9.3  9.4  9.3   Total Protein 6.5 - 8.1 g/dL 7.9   7.8   Total Bilirubin 0.3 - 1.2 mg/dL 0.6   0.6   Alkaline Phos 38 - 126 U/L 322   349   AST 15 - 41 U/L 75   65   ALT 0 - 44 U/L 43   49     RADIOGRAPHIC STUDIES: I have personally reviewed the radiological images as listed and agreed with the findings in the report. No results found.  ASSESSMENT & PLAN [name] [name] is a 88 y.o. female presenting to the clinic for normocytic anemia.   # Normocytic anemia: --Likely multifactorial secondary to iron deficiency and CKD.  --Currently on ferrous sulfate 325 mg once every other day to minimize constipation.  --Patient denies any bleeding at this time. She follows with Dr. Wilford Corner [facility]). --Labs today show no evidence of anemia with a hemoglobin of 12.1.  Iron panel shows no evidence of deficiency. --No further intervention is required at this time.  Continue to monitor. --RTC in 6 months with repeat labs.   # CKD:  --Creatinine levels stable. Today, creatinine is 1.52 --She is under the care of Dr. Gean Quint at [facility].   #Cirrhosis: --Liver enzymes stable since October 2022.  --Managed by Dr. Michail Sermon.   No orders of the defined types were placed in this encounter.  All questions were answered. The patient knows to call the clinic with any problems, questions or concerns.  I have spent a total of  25 minutes minutes of face-to-face and non-face-to-face time, preparing to see the patient, performing a medically appropriate examination, counseling and educating the patient, ordering tests, communicating with other health care professionals, documenting clinical information in the electronic health record, and care coordination.    Dede Query, PA-C Department of Hematology/Oncology [place] at [facility] Phone: [phone]

## 2021-12-21 ENCOUNTER — Other Ambulatory Visit: Payer: Self-pay

## 2021-12-21 DIAGNOSIS — I2510 Atherosclerotic heart disease of native coronary artery without angina pectoris: Secondary | ICD-10-CM

## 2021-12-21 MED ORDER — CLOPIDOGREL BISULFATE 75 MG PO TABS: 75.0000 mg | ORAL_TABLET | Freq: Every day | ORAL | 3 refills | Status: DC

## 2021-12-25 LAB — HEPATIC FUNCTION PANEL
ALT: 58 IU/L — ABNORMAL HIGH (ref 0–32)
AST: 97 IU/L — ABNORMAL HIGH (ref 0–40)
Albumin: 4.4 g/dL (ref 3.7–4.7)
Alkaline Phosphatase: 485 IU/L — ABNORMAL HIGH (ref 44–121)
Bilirubin Total: 0.8 mg/dL (ref 0.0–1.2)
Bilirubin, Direct: 0.35 mg/dL (ref 0.00–0.40)
Total Protein: 7.7 g/dL (ref 6.0–8.5)

## 2021-12-25 NOTE — Progress Notes (Signed)
Please advise ber to cut Simvastatin/Zetia dose in 1/2. She has to f/u with her PCP for chronically elevated liver enzymes

## 2021-12-26 NOTE — Progress Notes (Signed)
Called patient to inform her about her test results. Patient understood.

## 2022-01-24 ENCOUNTER — Other Ambulatory Visit: Payer: Self-pay | Admitting: Cardiology

## 2022-01-24 DIAGNOSIS — E7800 Pure hypercholesterolemia, unspecified: Secondary | ICD-10-CM

## 2022-02-22 ENCOUNTER — Other Ambulatory Visit: Payer: Self-pay | Admitting: Cardiology

## 2022-02-23 ENCOUNTER — Other Ambulatory Visit: Payer: Self-pay | Admitting: Pulmonary Disease

## 2022-02-26 ENCOUNTER — Other Ambulatory Visit: Payer: Self-pay | Admitting: Pulmonary Disease

## 2022-03-12 ENCOUNTER — Other Ambulatory Visit: Payer: Self-pay | Admitting: Gastroenterology

## 2022-03-12 DIAGNOSIS — R748 Abnormal levels of other serum enzymes: Secondary | ICD-10-CM

## 2022-04-08 ENCOUNTER — Encounter (HOSPITAL_BASED_OUTPATIENT_CLINIC_OR_DEPARTMENT_OTHER): Payer: Self-pay | Admitting: Pulmonary Disease

## 2022-04-08 ENCOUNTER — Ambulatory Visit (HOSPITAL_BASED_OUTPATIENT_CLINIC_OR_DEPARTMENT_OTHER): Payer: Federal, State, Local not specified - PPO | Admitting: Pulmonary Disease

## 2022-04-08 VITALS — BP 110/62 | HR 67 | Ht 59.0 in | Wt 115.6 lb

## 2022-04-08 DIAGNOSIS — J432 Centrilobular emphysema: Secondary | ICD-10-CM

## 2022-04-08 NOTE — Progress Notes (Signed)
Subjective:   PATIENT ID: [name] [name] GENDER: female DOB: Jun 19, 1931, MRN: [id number]   HPI  Chief Complaint  Patient presents with   Follow-up    Believes she's sob when she takes laxis   Reason for Visit: Follow-up  [name] is a 89 year old female never smoker with emphysema, CAD s/p stent, AVS s/pt TAVR 01/2019, pulmonary nodules who presents for follow-up.  Synopsis:  She was referred to Pulmonary after being discharged from the hospital in 04/2020. Per review of discharge note she required 1-2L O2 in the ED. V/Q scan was negative for PE. While inpatient she was diuresed. Cardiology was consulted and plan for outpatient nuclear stress test. She was also referred to Pulmonary to consider PFT for evaluation of her emphysema. At time of discharge she did not require any oxygen. She has been on lasix and has noticed weight gain of 10lbs post-discharge. This morning is 116lb, baseline is 113-115lb.  She reports childhood asthma and respiratory complications during pregnancy in her 20-30s. She was on a nebulizer 2-3 x daily in her 50-60s and needing steroids at least twice a year. However in her 70s-80s she has not had any issues or needing any medications for her asthma and this may have been related from removing environmental issues (older home, dust, tobacco farms, gardening)  She has had multiple sinus surgery x 4 including for nasal polyp removal. She was seen by ENT yesterday. She takes zyrtec and saline rinses. She is scheduled for stress test next week with [facility].  05/18/20 Since our last visit, she has been tolerating Spiriva and feels that it is improving her shortness of breath. Her daughter is present with her daughter and wish to review PFT results.  10/02/20 Since our last visit she was seen by ENT and underwent sinus surgery (sinus fusion and intranasal polypectomy) in Aug 2022 with path returning with pseudomonas and fungus. She was treated with a  course of amoxicillin. Last ENT visit was 09/29/20 and started on nasal rinses and sprays. Not sure if this is effective yet since she recently started. She is on diuretics which she manages diligently with daily weight checks. She has been referred to a Nephrology. She tolerating the Spiriva and feels it is helping her. Uses albuterol on average once a week for shortness of breath.    12/28/20 Since our last visit she reports she is overall well controlled. Her shortness of breath is stable. She is able to perform activities around the house but she does not go upstairs. Improved intermittent cough. No wheezing. Uses albuterol once a week.  08/17/21 She reports worsening shortness of breath and has been evaluated by Cardiology. Stress test negative. She reports worsening ability to take a deep breath. Has been taking her albuterol daily more frequently. She is active at baseline. Occasional cough since her COVID infection at Christmas. Compliant with Spiriva. Daughter on the phone reports she isn't compliant with her lasix   10/22/21 Since our last visit she reports that she has some good days and some bad days for shortness of breath. Compliant with Stiolto which is improving cough. She is taking lasix as needed and not currently having any increased weight or leg swelling. She is using albuterol inhaler once a week but thinks she should be using it more. Not very active at baseline. Has to care for her husband and feels her plate is full.  04/08/22 Since our last visit she is compliant with  her Stiolto with improved cough. Will occur while laying down which she attributes to her hiatal hernia. She uses albuterol three times a month. Denies wheezing.On days she does have shortness of breath, lasix will resolve this. Her appetite is not as robust and drinking boost 3-4 days a week. Eats mainly fruits and vegetables. Avoids beef.   Social History: Never smoker Her mother was diagnosed with  tuberculosis Wood burning stove  Past Medical History:  Diagnosis Date   Arthritis    Asthma    history of   CAD (coronary artery disease)    Cirrhosis ([place])    CKD (chronic kidney disease)    Dyspnea    sometimes sitting.lying and on exertion   Emphysema lung (H[facility])    GERD (gastroesophageal reflux disease)    History of hiatal hernia    History of transcatheter aortic valve replacement (TAVR) 01/26/2019   (TAVR  23 mm Evolute Pro in aortic position 01/26/2019) .    Hyperlipidemia    Hypertension    Hypothyroidism    Lipoma    left arm   Nasal polyps    Pneumonia    Pulmonary nodule    noted on pre TAVR CT   Samter's triad    Severe aortic stenosis      Allergies  Allergen Reactions   Aspirin Anaphylaxis   Bee Venom Anaphylaxis   Ceclor [Cefaclor] Anaphylaxis   Pneumococcal Vac Polyvalent     Other reaction(s): swelling and erythema   Statins Other (See Comments)    Elevated liver enzymes   Crestor [Rosuvastatin] Rash   Sulfa Antibiotics Rash     Outpatient Medications Prior to Visit  Medication Sig Dispense Refill   albuterol (VENTOLIN HFA) 108 (90 Base) MCG/ACT inhaler INHALE 2 PUFFS INTO THE LUNGS EVERY 6 (SIX) HOURS AS NEEDED FOR SHORTNESS OF BREATH (USE AS NEEDED OR BEFORE ACTIVITY FOR SHORTNESS OF BREATH OR WHEZZING) 8.5 each 6   clopidogrel (PLAVIX) 75 MG tablet Take 1 tablet (75 mg total) by mouth daily. 90 tablet 3   diphenhydrAMINE (BENADRYL) 25 mg capsule Take 25 mg by mouth every 6 (six) hours as needed.     ferrous sulfate 325 (65 FE) MG EC tablet Take 1 tablet (325 mg total) by mouth daily with breakfast. (Patient taking differently: Take 325 mg by mouth every other day.) 30 tablet 3   fluticasone (FLONASE) 50 MCG/ACT nasal spray Place into both nostrils daily.     furosemide (LASIX) 20 MG tablet Take 1 tablet (20 mg total) by mouth as needed. 90 tablet 0   levothyroxine (SYNTHROID) 50 MCG tablet Take 50 mcg by mouth as needed. In am     meclizine  (ANTIVERT) 25 MG tablet Take 25 mg by mouth 3 (three) times daily as needed for dizziness.     montelukast (SINGULAIR) 10 MG tablet TAKE 1 TABLET BY MOUTH EVERY DAY 90 tablet 3   Multiple Vitamin (MULTIVITAMIN WITH MINERALS) TABS tablet Take 1 tablet by mouth every other day.     Peak Flow Meter DEVI 1 puff by Does not apply route in the morning and at bedtime. 1 each 0   Polyethyl Glycol-Propyl Glycol (SYSTANE) 0.4-0.3 % SOLN Place 1 drop into both eyes daily as needed (dry/irritated eyes.).     potassium chloride (KLOR-CON) 10 MEQ tablet Take 1 tablet (10 mEq total) by mouth daily. (Patient taking differently: Take 10 mEq by mouth daily. Only takes with lasix) 90 tablet 3   propranolol (INDERAL) 20 MG  tablet TAKE 1 TABLET BY MOUTH THREE TIMES A DAY 270 tablet 1   SALINE NASAL SPRAY NA Place 1 spray into the nose daily.     Tiotropium Bromide-Olodaterol (STIOLTO RESPIMAT) 2.5-2.5 MCG/ACT AERS INHALE 2 PUFFS BY MOUTH INTO THE LUNGS DAILY 4 g 5   Biotin 5 MG CAPS Take 500 mg by mouth daily.     calcium-vitamin D (OSCAL WITH D) 250-125 MG-UNIT tablet Take 1 tablet by mouth daily.     ezetimibe-simvastatin (VYTORIN) 10-40 MG tablet TAKE 1 TABLET BY MOUTH DAILY AT 6 PM. (Patient not taking: Reported on 04/08/2022) 90 tablet 1   No facility-administered medications prior to visit.    Review of Systems  Constitutional:  Negative for chills, diaphoresis, fever, malaise/fatigue and weight loss.  HENT:  Negative for congestion.   Respiratory:  Positive for cough. Negative for hemoptysis, sputum production, shortness of breath and wheezing.   Cardiovascular:  Negative for chest pain, palpitations and leg swelling.     Objective:   Vitals:   04/08/22 0949  BP: 110/62  Pulse: 67  SpO2: 96%  Weight: 115 lb 9.6 oz (52.4 kg)  Height: 4\' 11"  (1.499 m)   SpO2: 96 % O2 Device: None (Room air)  Physical Exam: General: Well-appearing, no acute distress HENT: [place], AT Eyes: EOMI, no scleral  icterus Respiratory: Clear to auscultation bilaterally.  No crackles, wheezing or rales Cardiovascular: RRR, -M/R/G, no JVD Extremities:-Edema,-tenderness Neuro: AAO x4, CNII-XII grossly intact Psych: Normal mood, normal affect  Data Reviewed:  Imaging: CT Chest 12/23/19 - S/p SVR. Tortuous thoracic aorta. Mild centrilobular emphysema. Large hiatal hernia. Stable pulmonary nodules including RML 7x5 mm and LLL ~4.5 mm, unchanged from prior imaging  CT Chest 12/25/20 - Unchanged 7 mm nodule, stable subcentimeter nodules. Large hiatal hernia.   CXR 10/22/21 - No acute infiltrate, effusion or edema. Large hiatal hernia  PFT: 05/05/20 FVC 1.7 (91%) FEV1 1.26 (93%) Ratio 73  TLC 84% DLCO 43% Interpretation:  Normal spirometry. No obstructive or restrictive defect  Labs: WBC 05/04/20 9.9 Absolute eos 05/04/20 200  Assessment & Plan:   Discussion: 89 year old with asthma, emphysema on CT who presents for follow-up. Cough improved on Stiolto. Discussed clinical course and management of emphysema/asthma including bronchodilator regimen, preventive care including vaccinations and action plan for exacerbation. Previously recommended pulmonary rehab however patient declined due to schedule.  Prior Inhalers Spiriva - good except for cough Stiolto - improved cough control  Asthma/Emphysema - stable shortness of breath. Resolved cough --CONTINUE Stiolto 2.5/2.5 mcg TWO puffs ONCE a day --CONTINUE Albuterol as needed for shortness of breath or wheezing --CONTINUE Singulair 10 mg daily  History of TB exposure --QF-TB neg  RML lung nodule Stable subcentimeter nodules --Stable 7 mm nodule since 2020 --No further follow-up needed. Patient not interested in aggressive management  Health Maintenance Immunization History  Administered Date(s) Administered   Fluad Quad(high Dose 65+) 11/05/2021   Influenza Split 10/19/2012, 10/07/2016, 10/25/2019   Influenza Whole 10/27/2015    Influenza-Unspecified 11/08/2009, 10/18/2011, 10/10/2014, 10/27/2015, 11/05/2017   PFIZER Comirnaty(Gray Top)Covid-19 Tri-Sucrose Vaccine 10/12/2021   PFIZER(Purple Top)SARS-COV-2 Vaccination 02/25/2019, 03/24/2019, 11/08/2019, 06/16/2020   Pfizer Covid-19 Vaccine Bivalent Booster 10yrs & up 10/09/2021   Pneumococcal Conjugate-13 09/21/2014, 11/24/2015   Pneumococcal Polysaccharide-23 04/29/1998, 01/21/2006, 06/05/2006, 05/10/2013   Td 06/22/2003   Tdap 09/09/2013, 10/27/2015   Zoster, Live 06/05/2006, 07/22/2017, 10/08/2017   CT Lung Screen - not indicated  No orders of the defined types were placed in this encounter.  No orders of the defined types were placed in this encounter.  Return in about 5 months (around 09/08/2022).  I have spent a total time of 30-minutes on the day of the appointment including chart review, data review, collecting history, coordinating care and discussing medical diagnosis and plan with the patient/family. Past medical history, allergies, medications were reviewed. Pertinent imaging, labs and tests included in this note have been reviewed and interpreted independently by me.  [place], MD [facility] 04/08/2022  Office Number [phone]

## 2022-04-08 NOTE — Patient Instructions (Signed)
Asthma/Emphysema - stable shortness of breath. Resolved cough --CONTINUE Stiolto 2.5/2.5 mcg TWO puffs ONCE a day --CONTINUE Albuterol as needed for shortness of breath or wheezing --CONTINUE Singulair 10 mg daily  Follow-up with me in 5 months

## 2022-04-09 ENCOUNTER — Other Ambulatory Visit: Payer: Federal, State, Local not specified - PPO

## 2022-04-10 ENCOUNTER — Other Ambulatory Visit (HOSPITAL_BASED_OUTPATIENT_CLINIC_OR_DEPARTMENT_OTHER): Payer: Self-pay | Admitting: Pulmonary Disease

## 2022-04-11 ENCOUNTER — Other Ambulatory Visit: Payer: Self-pay

## 2022-04-11 MED ORDER — PROPRANOLOL HCL 20 MG PO TABS: 20.0000 mg | ORAL_TABLET | Freq: Three times a day (TID) | ORAL | 1 refills | Status: DC

## 2022-04-24 ENCOUNTER — Ambulatory Visit
Admission: RE | Admit: 2022-04-24 | Discharge: 2022-04-24 | Disposition: A | Discharge status: Home / Self Care | Payer: Federal, State, Local not specified - PPO | Source: Ambulatory Visit | Attending: Gastroenterology | Admitting: Gastroenterology

## 2022-04-24 DIAGNOSIS — R748 Abnormal levels of other serum enzymes: Secondary | ICD-10-CM

## 2022-04-30 ENCOUNTER — Encounter: Payer: Self-pay | Admitting: Cardiology

## 2022-04-30 ENCOUNTER — Ambulatory Visit: Payer: Federal, State, Local not specified - PPO | Admitting: Cardiology

## 2022-04-30 VITALS — BP 111/62 | HR 82 | Resp 16 | Ht 59.0 in | Wt 117.0 lb

## 2022-04-30 DIAGNOSIS — I2510 Atherosclerotic heart disease of native coronary artery without angina pectoris: Secondary | ICD-10-CM

## 2022-04-30 DIAGNOSIS — I10 Essential (primary) hypertension: Secondary | ICD-10-CM

## 2022-04-30 DIAGNOSIS — E7800 Pure hypercholesterolemia, unspecified: Secondary | ICD-10-CM

## 2022-04-30 DIAGNOSIS — Z952 Presence of prosthetic heart valve: Secondary | ICD-10-CM

## 2022-04-30 MED ORDER — ATORVASTATIN CALCIUM 10 MG PO TABS: 5.0000 mg | ORAL_TABLET | Freq: Every day | ORAL | 3 refills | Status: DC

## 2022-04-30 NOTE — Progress Notes (Signed)
Primary Physician/Referring:  Ollen Bowl, MD  Patient ID: [name] [name], female    DOB: November 15, 1931, 89 y.o.   MRN: [id number]  No chief complaint on file.  HPI:    [name] [name]  is a 89 y.o. 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 Underwent elective TAVR on 01/26/2019.  This is a 87-month office visit, presently doing well and remains asymptomatic. Denies chest pain, palpitations, leg swelling, orthopnea, PND.  Past Medical History:  Diagnosis Date   Arthritis    Asthma    history of   CAD (coronary artery disease)    Cirrhosis (HCC)    CKD (chronic kidney disease)    Dyspnea    sometimes sitting.lying and on exertion   Emphysema lung (HCC)    GERD (gastroesophageal reflux disease)    History of hiatal hernia    History of transcatheter aortic valve replacement (TAVR) 01/26/2019   (TAVR  23 mm Evolute Pro in aortic position 01/26/2019) .    Hyperlipidemia    Hypertension    Hypothyroidism    Lipoma    left arm   Nasal polyps    Pneumonia    Pulmonary nodule    noted on pre TAVR CT   Samter's triad    Severe aortic stenosis    Social History   Tobacco Use   Smoking status: Never   Smokeless tobacco: Never  Substance Use Topics   Alcohol use: No    Marital Status: Married  ROS  Review of Systems  Cardiovascular:  Positive for dyspnea on exertion. Negative for chest pain and leg swelling.   Objective  There were no vitals taken for this visit. There is no height or weight on file to calculate BMI.      04/08/2022    9:49 AM 11/23/2021    2:47 PM 87/09/2021    2:22 PM  Vitals with BMI  Height 4\' 11"   4\' 11"   Weight 115 lbs 10 oz 115 lbs 14 oz 116 lbs 6 oz  BMI 23.34  23.5   Systolic 110 128 409  Diastolic 62 72 68  Pulse 67 69 76    Physical Exam Vitals reviewed.  Neck:     Vascular: Carotid bruit (soft conducted bruit bilateral) present. No JVD.  Cardiovascular:     Rate and Rhythm: Normal rate and regular rhythm.     Pulses: Normal pulses and intact distal pulses.     Heart sounds: S1 normal and S2 normal. Murmur heard.     Harsh midsystolic murmur is present with a grade of 3/6 at the upper right sternal border radiating to the neck.     No gallop.  Pulmonary:     Effort: Pulmonary effort is normal.     Breath sounds: Normal breath sounds.  Abdominal:     General: Bowel sounds are normal.     Palpations: Abdomen is soft.  Musculoskeletal:     Right lower leg: No edema.     Left lower leg: No edema.    Laboratory examination:   Recent Labs    05/08/21 1432 07/26/21 1222 11/23/21 1435  NA 135 132* 136  K 3.8 4.7 4.0  CL 96* 95* 99  CO2 30 25 30   GLUCOSE 93  91 122*  BUN 24* 27 20  CREATININE 1.56* 1.39* 1.52*  CALCIUM 9.3 9.4 9.3  GFRNONAA 32*  --  33*       Latest Ref Rng & Units 12/24/2021    3:55 PM 11/23/2021    2:35 PM 07/26/2021   12:22 PM  CMP  Glucose 70 - 99 mg/dL  333  91   BUN 8 - 23 mg/dL  20  27   Creatinine 8.32 - 1.00 mg/dL  9.19  1.66   Sodium 060 - 145 mmol/L  136  132   Potassium 3.5 - 5.1 mmol/L  4.0  4.7   Chloride 98 - 111 mmol/L  99  95   CO2 22 - 32 mmol/L  30  25   Calcium 8.9 - 10.3 mg/dL  9.3  9.4   Total Protein 6.0 - 8.5 g/dL 7.7  7.9    Total Bilirubin 0.0 - 1.2 mg/dL 0.8  0.6    Alkaline Phos 44 - 121 IU/L 485  322    AST 0 - 40 IU/L 97  75    ALT 0 - 32 IU/L 58  43        Latest Ref Rng & Units 11/23/2021    2:35 PM 07/26/2021   12:22 PM 05/08/2021    2:32 PM  CBC  WBC 4.0 - 10.5 K/uL 7.2  7.8  6.9   Hemoglobin 12.0 - 15.0 g/dL 04.5  99.7  74.1   Hematocrit 36.0 - 46.0 % 36.7  36.7  36.0   Platelets 150 - 400 K/uL 216  209  236    External Labs:  Labs 10/10/2021:  Hb 12.5/HCT 37.3,  platelets 235, normal indicis.  LFTs normal.  Total cholesterol 284, triglycerides 91, HDL 74, LDL 195.  Non-HDL cholesterol 210.  TSH normal at 2.60.  A1c 6.0%.  Serum glucose: 9 mg, BUN 29, creatinine 1.46, EGFR 34 mL, potassium 4.2.    Radiology:   CT cardiac morphology 03/14/2019: 1. Well positioned 23 mm Evolut Pro valve with normal appearing leaflets. Good coaptation with no HALT/HAM. Although target diameters for inflow, waist, outflow in systole are slightly low the mechanism of worsening PVL appears to be primarily a large area of calcification posterior to the stent valve from the native non coronary cusp. This area of calcium measures 4.9 x 5.7 mm and is best seen on SA images at 7:00.  Extracardiac:  1. Right middle lobe nodule is stable from 09/12/2018. Additional follow-up CT chest without contrast in 12 months is recommended to ensure stability and exclude malignancy. This recommendation follows the consensus statement: Guidelines for Management of Small Pulmonary Nodules Detected on CT Images: From the Fleischner Society 2017; Radiology 2017; 284:228-243. 2.  Aortic atherosclerosis (ICD10-I70.0). 3. Large hiatal hernia.  CXR 10/22/21 - No acute infiltrate, effusion or edema. Large hiatal hernia  Cardiac Studies:   Left Heart Catheterization 01/05/2019:  Successful orbital atherectomy followed by stenting of the proximal LAD with a 3.0 x 18 mm resolute Onyx DES and proximal segment postdilated with a 3.5 x 8 mm Sapphire [place].  Stenosis 90% to 0%.  TIMI-3 to TIMI-3 flow.  Orbital atherectomy followed by balloon angioplasty with 2.5 x 15 mm Sapphire balloon for D1, 90% to less than 15 to 20% stenosis.  TIMI-3 to TIMI-3 flow.   01/26/2019 (Dr. Clifton James): Transcatheter Aortic Valve Replacement - Transfemoral Approach             Medtronic Evolut Pro Plus THV (size  23 mm, model #[id number], serial # [id number])   PCV MYOCARDIAL PERFUSION WITH LEXISCAN  08/06/2021  Narrative Lexiscan Nuclear stress test 08/06/2021: Nondiagnostic ECG stress. The heart rate response was consistent with Regadenoson. Myocardial perfusion is normal. Overall LV systolic function is normal without regional wall motion abnormalities. Stress LV EF: 60%. Compared to the study done on 05/15/2020, no significant change.  Low risk.   PCV ECHOCARDIOGRAM COMPLETE 09/06/2021  Narrative Echocardiogram 09/06/2021: Normal LV systolic function with visual EF 55-60%. Left ventricle cavity is normal in size. Mild concentric hypertrophy of the left ventricle. Normal global wall motion. Doppler evidence of grade I (impaired) diastolic dysfunction, normal LAP. Calculated EF 55%. Bioprosthetic trileaflet aortic valve.  Mild (Grade I) aortic regurgitation. Trace mitral regurgitation. Severe mitral valve leaflet calcification. Moderately restricted mitral valve leaflets. Mild mitral valve stenosis. Structurally normal tricuspid valve.  Moderate tricuspid regurgitation. No evidence of pulmonary hypertension. Structurally normal pulmonic valve.  Mild pulmonic regurgitation.   EKG   EKG 04/30/2022: Normal sinus rhythm at rate of 79 bpm, left bundle branch block.  No further analysis.  Compared to 10/29/2021, no significant change.    Allergies and medications   Allergies  Allergen Reactions   Aspirin Anaphylaxis   Bee Venom Anaphylaxis   Ceclor [Cefaclor] Anaphylaxis   Pneumococcal Vac Polyvalent     Other reaction(s): swelling and erythema   Statins Other (See Comments)    Elevated liver enzymes   Crestor [Rosuvastatin] Rash   Sulfa Antibiotics Rash     Current Outpatient Medications:    albuterol (VENTOLIN HFA) 108 (90 Base) MCG/ACT inhaler, INHALE 2 PUFFS INTO THE LUNGS EVERY 6 (SIX) HOURS AS NEEDED FOR SHORTNESS OF BREATH (USE AS NEEDED OR BEFORE ACTIVITY FOR SHORTNESS OF BREATH OR WHEZZING), Disp: 8.5 each, Rfl: 6   Biotin 5 MG CAPS, Take 500 mg by mouth daily., Disp:  , Rfl:    calcium-vitamin D (OSCAL WITH D) 250-125 MG-UNIT tablet, Take 1 tablet by mouth daily., Disp: , Rfl:    clopidogrel (PLAVIX) 75 MG tablet, Take 1 tablet (75 mg total) by mouth daily., Disp: 90 tablet, Rfl: 3   diphenhydrAMINE (BENADRYL) 25 mg capsule, Take 25 mg by mouth every 6 (six) hours as needed., Disp: , Rfl:    fluticasone (FLONASE) 50 MCG/ACT nasal spray, Place into both nostrils daily., Disp: , Rfl:    furosemide (LASIX) 20 MG tablet, Take 1 tablet (20 mg total) by mouth as needed., Disp: 90 tablet, Rfl: 0   levothyroxine (SYNTHROID) 50 MCG tablet, Take 50 mcg by mouth as needed. In am, Disp: , Rfl:    meclizine (ANTIVERT) 25 MG tablet, Take 25 mg by mouth 3 (three) times daily as needed for dizziness., Disp: , Rfl:    montelukast (SINGULAIR) 10 MG tablet, TAKE 1 TABLET BY MOUTH EVERY DAY, Disp: 90 tablet, Rfl: 3   Multiple Vitamin (MULTIVITAMIN WITH MINERALS) TABS tablet, Take 1 tablet by mouth every other day., Disp: , Rfl:    Peak Flow Meter DEVI, 1 puff by Does not apply route in the morning and at bedtime., Disp: 1 each, Rfl: 0   Polyethyl Glycol-Propyl Glycol (SYSTANE) 0.4-0.3 % SOLN, Place 1 drop into both eyes daily as needed (dry/irritated eyes.)., Disp: , Rfl:    potassium chloride (KLOR-CON) 10 MEQ tablet, Take 1 tablet (10 mEq total) by mouth daily. (Patient taking differently: Take 10 mEq by mouth daily. Only takes with lasix), Disp: 90 tablet, Rfl: 3   propranolol (INDERAL) 20 MG tablet, Take  1 tablet (20 mg total) by mouth 3 (three) times daily., Disp: 270 tablet, Rfl: 1   SALINE NASAL SPRAY NA, Place 1 spray into the nose daily., Disp: , Rfl:    Tiotropium Bromide-Olodaterol (STIOLTO RESPIMAT) 2.5-2.5 MCG/ACT AERS, INHALE 2 PUFFS BY MOUTH INTO THE LUNGS DAILY, Disp: 4 g, Rfl: 5   Assessment     ICD-10-CM   1. Coronary artery disease involving native coronary artery of native heart without angina pectoris  I25.10     2. S/P TAVR (transcatheter aortic valve  replacement)  Z95.2     3. Primary hypertension  I10     4. Hypercholesteremia  E78.00       No orders of the defined types were placed in this encounter.  There are no discontinued medications.   Recommendations:   [name] [name]  is a 89 y.o. 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 Underwent elective TAVR on 01/26/2019.  1. Coronary artery disease involving native coronary artery of native heart without angina pectoris Patient with known coronary artery disease with residual ramus intermediate artery that has not returned but has a proximal LAD stent.  She is presently doing well with no changes in the EKG which has underlying left bundle branch block.  She has markedly elevated LDL, and high intensity statin is not utilized in view of elevated liver enzymes and cirrhosis of the liver.  She was tolerating simvastatin 20 mg however she started developing alopecia hence had to be discontinued.  I would like to try 5 mg of atorvastatin daily.  - EKG 12-Lead - PCV ECHOCARDIOGRAM COMPLETE; Future - atorvastatin (LIPITOR) 10 MG tablet; Take 0.5 tablets (5 mg total) by mouth daily.  Dispense: 45 tablet; Refill: 3  2. S/P TAVR : 01/26/2019: Medtronic Evolut Pro Plus THV (size 23 mm) No change in the aortic systolic flow murmur, will continue annual surveillance echocardiogram.  Patient is aware that she needs endocarditis prophylaxis. - PCV ECHOCARDIOGRAM COMPLETE; Future  3. Primary hypertension Blood pressure under excellent control.  No changes in the medications were done today.  Renal function has remained stable.  4. Hypercholesteremia I have started her on low-dose atorvastatin in view of cirrhosis of liver and  chronically elevated LFTs. - atorvastatin (LIPITOR) 10 MG tablet; Take 0.5 tablets (5 mg total) by mouth daily.  Dispense: 45 tablet; Refill: 3   I will see her back in 6 months for follow-up.    Yates Decamp, PA-C 04/30/2022, 2:53 PM Office: [phone]

## 2022-06-29 ENCOUNTER — Other Ambulatory Visit (HOSPITAL_BASED_OUTPATIENT_CLINIC_OR_DEPARTMENT_OTHER): Payer: Self-pay | Admitting: Pulmonary Disease

## 2022-07-05 ENCOUNTER — Other Ambulatory Visit: Payer: Self-pay

## 2022-07-05 DIAGNOSIS — I5033 Acute on chronic diastolic (congestive) heart failure: Secondary | ICD-10-CM

## 2022-07-05 MED ORDER — FUROSEMIDE 20 MG PO TABS
20.0000 mg | ORAL_TABLET | ORAL | 3 refills | Status: DC | PRN
Start: 2022-07-05 — End: 2023-04-01

## 2022-09-22 ENCOUNTER — Observation Stay (HOSPITAL_COMMUNITY): Payer: Federal, State, Local not specified - PPO

## 2022-09-22 ENCOUNTER — Encounter (HOSPITAL_COMMUNITY): Payer: Self-pay

## 2022-09-22 ENCOUNTER — Emergency Department (HOSPITAL_COMMUNITY): Payer: Federal, State, Local not specified - PPO

## 2022-09-22 ENCOUNTER — Other Ambulatory Visit: Payer: Self-pay

## 2022-09-22 ENCOUNTER — Observation Stay (HOSPITAL_COMMUNITY)
Admission: EM | Admit: 2022-09-22 | Payer: Federal, State, Local not specified - PPO | Source: Home / Self Care | Attending: Emergency Medicine | Admitting: Emergency Medicine

## 2022-09-22 ENCOUNTER — Telehealth: Payer: Self-pay | Admitting: Cardiology

## 2022-09-22 DIAGNOSIS — J45909 Unspecified asthma, uncomplicated: Secondary | ICD-10-CM | POA: Diagnosis not present

## 2022-09-22 DIAGNOSIS — K7460 Unspecified cirrhosis of liver: Secondary | ICD-10-CM | POA: Diagnosis present

## 2022-09-22 DIAGNOSIS — Z7902 Long term (current) use of antithrombotics/antiplatelets: Secondary | ICD-10-CM | POA: Insufficient documentation

## 2022-09-22 DIAGNOSIS — E039 Hypothyroidism, unspecified: Secondary | ICD-10-CM | POA: Diagnosis present

## 2022-09-22 DIAGNOSIS — H3410 Central retinal artery occlusion, unspecified eye: Secondary | ICD-10-CM

## 2022-09-22 DIAGNOSIS — E785 Hyperlipidemia, unspecified: Secondary | ICD-10-CM | POA: Diagnosis present

## 2022-09-22 DIAGNOSIS — Z952 Presence of prosthetic heart valve: Secondary | ICD-10-CM | POA: Diagnosis present

## 2022-09-22 DIAGNOSIS — N1832 Chronic kidney disease, stage 3b: Secondary | ICD-10-CM | POA: Diagnosis present

## 2022-09-22 DIAGNOSIS — J449 Chronic obstructive pulmonary disease, unspecified: Secondary | ICD-10-CM | POA: Insufficient documentation

## 2022-09-22 DIAGNOSIS — H53132 Sudden visual loss, left eye: Secondary | ICD-10-CM | POA: Diagnosis present

## 2022-09-22 DIAGNOSIS — H547 Unspecified visual loss: Secondary | ICD-10-CM

## 2022-09-22 DIAGNOSIS — Z79899 Other long term (current) drug therapy: Secondary | ICD-10-CM | POA: Diagnosis not present

## 2022-09-22 DIAGNOSIS — H3412 Central retinal artery occlusion, left eye: Principal | ICD-10-CM | POA: Diagnosis present

## 2022-09-22 DIAGNOSIS — Z955 Presence of coronary angioplasty implant and graft: Secondary | ICD-10-CM | POA: Insufficient documentation

## 2022-09-22 DIAGNOSIS — J432 Centrilobular emphysema: Secondary | ICD-10-CM | POA: Diagnosis present

## 2022-09-22 DIAGNOSIS — I2510 Atherosclerotic heart disease of native coronary artery without angina pectoris: Secondary | ICD-10-CM | POA: Diagnosis present

## 2022-09-22 DIAGNOSIS — I129 Hypertensive chronic kidney disease with stage 1 through stage 4 chronic kidney disease, or unspecified chronic kidney disease: Secondary | ICD-10-CM | POA: Insufficient documentation

## 2022-09-22 LAB — I-STAT CHEM 8, ED
BUN: 22 mg/dL (ref 8–23)
Calcium, Ion: 1.09 mmol/L — ABNORMAL LOW (ref 1.15–1.40)
Chloride: 100 mmol/L (ref 98–111)
Creatinine, Ser: 1.3 mg/dL — ABNORMAL HIGH (ref 0.44–1.00)
Glucose, Bld: 92 mg/dL (ref 70–99)
HCT: 36 % (ref 36.0–46.0)
Hemoglobin: 12.2 g/dL (ref 12.0–15.0)
Potassium: 3.8 mmol/L (ref 3.5–5.1)
Sodium: 135 mmol/L (ref 135–145)
TCO2: 26 mmol/L (ref 22–32)

## 2022-09-22 LAB — COMPREHENSIVE METABOLIC PANEL
ALT: 71 U/L — ABNORMAL HIGH (ref 0–44)
AST: 105 U/L — ABNORMAL HIGH (ref 15–41)
Albumin: 3.2 g/dL — ABNORMAL LOW (ref 3.5–5.0)
Alkaline Phosphatase: 347 U/L — ABNORMAL HIGH (ref 38–126)
Anion gap: 14 (ref 5–15)
BUN: 19 mg/dL (ref 8–23)
CO2: 21 mmol/L — ABNORMAL LOW (ref 22–32)
Calcium: 8.6 mg/dL — ABNORMAL LOW (ref 8.9–10.3)
Chloride: 98 mmol/L (ref 98–111)
Creatinine, Ser: 1.21 mg/dL — ABNORMAL HIGH (ref 0.44–1.00)
GFR, Estimated: 43 mL/min — ABNORMAL LOW (ref >60)
Glucose, Bld: 92 mg/dL (ref 70–99)
Potassium: 3.7 mmol/L (ref 3.5–5.1)
Sodium: 133 mmol/L — ABNORMAL LOW (ref 135–145)
Total Bilirubin: 0.8 mg/dL (ref 0.3–1.2)
Total Protein: 7.1 g/dL (ref 6.5–8.1)

## 2022-09-22 LAB — DIFFERENTIAL
Abs Immature Granulocytes: 0.07 10*3/uL (ref 0.00–0.07)
Basophils Absolute: 0.1 10*3/uL (ref 0.0–0.1)
Basophils Relative: 1 %
Eosinophils Absolute: 0.1 10*3/uL (ref 0.0–0.5)
Eosinophils Relative: 2 %
Immature Granulocytes: 1 %
Lymphocytes Relative: 24 %
Lymphs Abs: 2.1 10*3/uL (ref 0.7–4.0)
Monocytes Absolute: 0.8 10*3/uL (ref 0.1–1.0)
Monocytes Relative: 10 %
Neutro Abs: 5.5 10*3/uL (ref 1.7–7.7)
Neutrophils Relative %: 62 %

## 2022-09-22 LAB — CBC
HCT: 36 % (ref 36.0–46.0)
Hemoglobin: 11.6 g/dL — ABNORMAL LOW (ref 12.0–15.0)
MCH: 31 pg (ref 26.0–34.0)
MCHC: 32.2 g/dL (ref 30.0–36.0)
MCV: 96.3 fL (ref 80.0–100.0)
Platelets: 209 10*3/uL (ref 150–400)
RBC: 3.74 MIL/uL — ABNORMAL LOW (ref 3.87–5.11)
RDW: 14.4 % (ref 11.5–15.5)
WBC: 8.7 10*3/uL (ref 4.0–10.5)
nRBC: 0 % (ref 0.0–0.2)

## 2022-09-22 LAB — ETHANOL: Alcohol, Ethyl (B): <10 mg/dL (ref <10)

## 2022-09-22 LAB — APTT: aPTT: 36 s (ref 24–36)

## 2022-09-22 LAB — PROTIME-INR
INR: 1.2 (ref 0.8–1.2)
Prothrombin Time: 14.9 s (ref 11.4–15.2)

## 2022-09-22 LAB — CBG MONITORING, ED
Glucose-Capillary: 79 mg/dL (ref 70–99)
Glucose-Capillary: 137 mg/dL — ABNORMAL HIGH (ref 70–99)

## 2022-09-22 MED ORDER — FLUTICASONE PROPIONATE 50 MCG/ACT NA SUSP
1.0000 | Freq: Every day | NASAL | Status: DC
  Administered 2022-09-23: 1 via NASAL
  Filled 2022-09-22: qty 16

## 2022-09-22 MED ORDER — ARFORMOTEROL TARTRATE 15 MCG/2ML IN NEBU
15.0000 ug | INHALATION_SOLUTION | Freq: Two times a day (BID) | RESPIRATORY_TRACT | Status: DC
  Administered 2022-09-23: 15 ug via RESPIRATORY_TRACT
  Filled 2022-09-22: qty 2

## 2022-09-22 MED ORDER — ONDANSETRON HCL 4 MG/2ML IJ SOLN: 4.0000 mg | Freq: Four times a day (QID) | INTRAMUSCULAR | Status: DC | PRN

## 2022-09-22 MED ORDER — ALBUTEROL SULFATE (2.5 MG/3ML) 0.083% IN NEBU: 3.0000 mL | INHALATION_SOLUTION | Freq: Four times a day (QID) | RESPIRATORY_TRACT | Status: DC | PRN

## 2022-09-22 MED ORDER — ACETAMINOPHEN 325 MG PO TABS: 650.0000 mg | ORAL_TABLET | ORAL | Status: DC | PRN

## 2022-09-22 MED ORDER — UMECLIDINIUM BROMIDE 62.5 MCG/ACT IN AEPB
1.0000 | INHALATION_SPRAY | Freq: Every day | RESPIRATORY_TRACT | Status: DC
  Administered 2022-09-23: 1 via RESPIRATORY_TRACT
  Filled 2022-09-22: qty 7

## 2022-09-22 MED ORDER — ATORVASTATIN CALCIUM 10 MG PO TABS
5.0000 mg | ORAL_TABLET | Freq: Every day | ORAL | Status: DC
  Administered 2022-09-23: 5 mg via ORAL
  Filled 2022-09-22: qty 1

## 2022-09-22 MED ORDER — POTASSIUM CHLORIDE CRYS ER 10 MEQ PO TBCR
10.0000 meq | EXTENDED_RELEASE_TABLET | ORAL | Status: DC
  Administered 2022-09-23: 10 meq via ORAL
  Filled 2022-09-22 (×2): qty 1

## 2022-09-22 MED ORDER — SENNOSIDES-DOCUSATE SODIUM 8.6-50 MG PO TABS
1.0000 | ORAL_TABLET | Freq: Every evening | ORAL | Status: DC | PRN
  Administered 2022-09-22: 1 via ORAL

## 2022-09-22 MED ORDER — SODIUM CHLORIDE 0.9% FLUSH: 3.0000 mL | Freq: Once | INTRAVENOUS | Status: DC

## 2022-09-22 MED ORDER — SALINE SPRAY 0.65 % NA SOLN
1.0000 | Freq: Every day | NASAL | Status: DC
  Administered 2022-09-23: 1 via NASAL
  Filled 2022-09-22: qty 44

## 2022-09-22 MED ORDER — STROKE: EARLY STAGES OF RECOVERY BOOK
Freq: Once | Status: AC
  Filled 2022-09-22: qty 1

## 2022-09-22 MED ORDER — HEPARIN SODIUM (PORCINE) 5000 UNIT/ML IJ SOLN
5000.0000 [IU] | Freq: Three times a day (TID) | INTRAMUSCULAR | Status: DC
  Administered 2022-09-22 – 2022-09-23 (×3): 5000 [IU] via SUBCUTANEOUS
  Filled 2022-09-22 (×3): qty 1

## 2022-09-22 MED ORDER — ACETAMINOPHEN 650 MG RE SUPP: 650.0000 mg | RECTAL | Status: DC | PRN

## 2022-09-22 MED ORDER — CLOPIDOGREL BISULFATE 75 MG PO TABS
75.0000 mg | ORAL_TABLET | Freq: Every day | ORAL | Status: DC
  Administered 2022-09-23: 75 mg via ORAL
  Filled 2022-09-22: qty 1

## 2022-09-22 MED ORDER — LEVOTHYROXINE SODIUM 50 MCG PO TABS
50.0000 ug | ORAL_TABLET | Freq: Every day | ORAL | Status: DC
  Administered 2022-09-23: 50 ug via ORAL
  Filled 2022-09-22: qty 1

## 2022-09-22 MED ORDER — ACETAMINOPHEN 160 MG/5ML PO SOLN: 650.0000 mg | ORAL | Status: DC | PRN

## 2022-09-22 MED ORDER — IOHEXOL 350 MG/ML SOLN
75.0000 mL | Freq: Once | INTRAVENOUS | Status: AC | PRN
  Administered 2022-09-22: 75 mL via INTRAVENOUS

## 2022-09-22 MED ORDER — MONTELUKAST SODIUM 10 MG PO TABS
10.0000 mg | ORAL_TABLET | Freq: Every day | ORAL | Status: DC
  Administered 2022-09-22: 10 mg via ORAL
  Filled 2022-09-22: qty 1

## 2022-09-22 MED ORDER — FUROSEMIDE 20 MG PO TABS
20.0000 mg | ORAL_TABLET | ORAL | Status: DC
  Administered 2022-09-23: 20 mg via ORAL
  Filled 2022-09-22: qty 1

## 2022-09-22 MED ORDER — PROPRANOLOL HCL 20 MG PO TABS
20.0000 mg | ORAL_TABLET | Freq: Three times a day (TID) | ORAL | Status: DC
  Administered 2022-09-22 – 2022-09-23 (×2): 20 mg via ORAL
  Filled 2022-09-22 (×4): qty 1

## 2022-09-22 NOTE — ED Triage Notes (Signed)
Pt arrives from eye doctor with concerns of central retinal artery occlusion. Pt noticed at 15:00 yesterday afternoon that the vision field in her left eye was cut in half and the top portion was a grayish color and the bottom portion she can see out of normally. Pt was seen by her eye doctor and sent here for stroke workup. Pt denies focal weakness, headache, slurred speech, or dizziness. Per pt, she has had a lot of stress lately with her husbands health issues.

## 2022-09-22 NOTE — Hospital Course (Signed)
[name] [name] is a 89 y.o. female with medical history significant for CAD s/p PCI on Plavix, severe AS s/p TAVR 2021, asthma/emphysema, hepatic cirrhosis with chronic transaminitis, CKD stage IIIb, HTN, HLD, hypothyroidism who is admitted for evaluation of CRAO of the left eye.

## 2022-09-22 NOTE — Telephone Encounter (Signed)
ON-CALL CARDIOLOGY 09/22/22  Patient's name: [name] [name].   MRN: [id number].    DOB: Nov 12, 1931 Primary care provider: Ollen Bowl, MD. Primary cardiologist: Dr. Yates Decamp  Interaction regarding this patient's care today: Dr. Charlotte Sanes from [facility] ophthalmology reached out regarding new diagnosis of central retinal artery occlusion  Patient has been experiencing left eye vision loss as of 09/21/2022  She wanted to discuss her prior imaging studies and comorbidities.  Impression:   ICD-10-CM   1. Vision loss  H54.7     2. Central retinal artery occlusion, unspecified laterality  H34.10       No orders of the defined types were placed in this encounter.   No orders of the defined types were placed in this encounter.   Recommendations: Reviewed the patient's last office visit note.  Reviewed the last 2 carotid duplex studies.  Per Dr. Charlotte Sanes no focal neurological deficits but favored ER visit due to the new findings of central retinal artery occlusion.  Telephone encounter total time: 7 minutes.   Tessa Lerner, [place], [facility]  Pager: [phone] Office: [phone]

## 2022-09-22 NOTE — H&P (Signed)
History and Physical    [name] [name] OZH:[id number] DOB: Oct 08, 1931 DOA: 09/22/2022  PCP: Ollen Bowl, MD  Patient coming from: Home  I have personally briefly reviewed patient's old medical records in [facility]  Chief Complaint: Left eye vision loss  HPI: [name] [name] is a 89 y.o. female with medical history significant for CAD s/p PCI on Plavix, severe AS s/p TAVR 2021, asthma/emphysema, hepatic cirrhosis with chronic transaminitis, CKD stage IIIb, HTN, HLD, hypothyroidism, chronic rhinosinusitis who presented to the ED from her ophthalmologist office for evaluation of a diagnosis of a central retinal artery occlusion involving the left eye.  Patient states that yesterday (8/31) around 3 PM when she was visiting her husband who is currently residing in a SNF she developed a new change in vision.  She says the upper half of her visual field had a grayish appearance.  She tried her Systane drops at that time and again at night however she had no improvement this morning therefore called [facility] ophthalmology.  She was able to be seen by Dr. Charlotte Sanes in office and she was diagnosed with central retinal artery occlusion of the left eye.  Per report there was concern that this was related to cholesterol emboli.  Patient was sent to the ED for further evaluation.  Patient has not had any headaches or temporal pain.  She denies any chest pain, palpitations, dyspnea, nausea, vomiting.  She has not had any focal weakness in her arms or legs or new numbness/tingling.  Patient has had issues with statins due to intolerance and her elevated liver enzymes however has been started on low-dose atorvastatin 5 mg earlier this year.  She also has an allergy to aspirin but has tolerated Plavix.  ED Course  Labs/Imaging on admission: I have personally reviewed following labs and imaging studies.  Initial vitals showed BP 145/76, pulse 76, RR 16, temp 98.3 F, SpO2 97% on room air.  Labs show WBC  8.7, hemoglobin 11.6, platelets 209,000, sodium 133, potassium 3.7, bicarb 21, BUN 19, creatinine 1.21, serum glucose 92, AST 105, ALT 71, alk phos 347, total bilirubin 0.8, serum ethanol <10.  CTA head/neck negative for LVO or proximal hemodynamically significant stenosis.  EDP consulted neurology who recommended medical admission for further workup.  The hospitalist service was consulted to admit for further evaluation and management.  Review of Systems: All systems reviewed and are negative except as documented in history of present illness above.   Past Medical History:  Diagnosis Date   Arthritis    Asthma    history of   CAD (coronary artery disease)    Cirrhosis (HCC)    CKD (chronic kidney disease)    Dyspnea    sometimes sitting.lying and on exertion   Emphysema lung (HCC)    GERD (gastroesophageal reflux disease)    History of hiatal hernia    History of transcatheter aortic valve replacement (TAVR) 01/26/2019   (TAVR  23 mm Evolute Pro in aortic position 01/26/2019) .    Hyperlipidemia    Hypertension    Hypothyroidism    Lipoma    left arm   Nasal polyps    Pneumonia    Pulmonary nodule    noted on pre TAVR CT   Samter's triad    Severe aortic stenosis     Past Surgical History:  Procedure Laterality Date   CHOLECYSTECTOMY  02/27/04   CORONARY ATHERECTOMY N/A 01/05/2019   Procedure: CORONARY ATHERECTOMY;  Surgeon: Yates Decamp, MD;  Location: [facility] INVASIVE CV LAB;  Service: Cardiovascular;  Laterality: N/A;   CORONARY BALLOON ANGIOPLASTY N/A 01/05/2019   Procedure: CORONARY BALLOON ANGIOPLASTY;  Surgeon: Yates Decamp, MD;  Location: [facility] INVASIVE CV LAB;  Service: Cardiovascular;  Laterality: N/A;   CORONARY STENT INTERVENTION N/A 01/05/2019   Procedure: CORONARY STENT INTERVENTION;  Surgeon: Yates Decamp, MD;  Location: [facility] INVASIVE CV LAB;  Service: Cardiovascular;  Laterality: N/A;   CORONARY STENT INTERVENTION  01/05/2019   INCONTINENCE SURGERY  09/12/09   NASAL  SINUS SURGERY     several     ORIF ANKLE FRACTURE Right 09/11/2013   Procedure: OPEN REDUCTION INTERNAL FIXATION (ORIF) ANKLE FRACTURE;  Surgeon: Verlee Rossetti, MD;  Location: [facility] ORS;  Service: Orthopedics;  Laterality: Right;   RIGHT/LEFT HEART CATH AND CORONARY ANGIOGRAPHY N/A 12/15/2018   Procedure: RIGHT/LEFT HEART CATH AND CORONARY ANGIOGRAPHY;  Surgeon: Yates Decamp, MD;  Location: [facility] INVAS[facility] CV [facility];  Service: Cardiovascular;  Laterality: N/A;   ROTATOR CUFF REPAIR  09/27/2003   SINUS ENDO WITH FUSION Bilateral 09/08/2020   Procedure: Bilateral Revision of Endoscopic Sinus Surgery with Fusion and Intranasal Polypectomy;  Surgeon: Osborn Coho, MD;  Location: [facility] OR;  Service: ENT;  Laterality: Bilateral;   TRANSCATHETER AORTIC VALVE REPLACEMENT, TRANSFEMORAL  01/26/2019   TRANSCATHETER AORTIC VALVE REPLACEMENT, TRANSFEMORAL N/A 01/26/2019   Procedure: TRANSCATHETER AORTIC VALVE REPLACEMENT, TRANSFEMORAL;  Surgeon: Kathleene Hazel, MD;  Location: [facility] OR;  Service: Open Heart Surgery;  Laterality: N/A;   TRIGGER FINGER RELEASE  10/29/06    Social History:  reports that she has never smoked. She has never used smokeless tobacco. She reports that she does not drink alcohol and does not use drugs.  Allergies  Allergen Reactions   Aspirin Anaphylaxis   Bee Venom Anaphylaxis   Ceclor [Cefaclor] Anaphylaxis   Simvastatin Other (See Comments)    Hair loss   Pneumococcal Vac Polyvalent     Other reaction(s): swelling and erythema   Statins Other (See Comments)    Elevated liver enzymes   Crestor [Rosuvastatin] Rash   Sulfa Antibiotics Rash    Family History  Problem Relation Age of Onset   Tuberculosis Mother    Other Father        Bronchiectasis   Sarcoidosis Sister    Dementia Sister    Heart disease Brother    Heart attack Brother    Cancer Maternal Grandmother        ovarian   Asthma Maternal Grandfather    Breast cancer Daughter    Brain cancer Daughter       Prior to Admission medications   Medication Sig Start Date End Date Taking? Authorizing Provider  albuterol (VENTOLIN HFA) 108 (90 Base) MCG/ACT inhaler INHALE 2 PUFFS INTO THE LUNGS EVERY 6 (SIX) HOURS AS NEEDED FOR SHORTNESS OF BREATH (USE AS NEEDED OR BEFORE ACTIVITY FOR SHORTNESS OF BREATH OR WHEZZING) 10/01/21   Luciano Cutter, MD  atorvastatin (LIPITOR) 10 MG tablet Take 0.5 tablets (5 mg total) by mouth daily. 04/30/22 04/25/23  Yates Decamp, MD  Biotin 5 MG CAPS Take 500 mg by mouth daily.    [provider]  calcium-vitamin D (OSCAL WITH D) 250-125 MG-UNIT tablet Take 1 tablet by mouth daily.    [provider]  clopidogrel (PLAVIX) 75 MG tablet Take 1 tablet (75 mg total) by mouth daily. 12/21/21   Yates Decamp, MD  diphenhydrAMINE (BENADRYL) 25 mg capsule Take 25 mg by mouth every 6 (six) hours as needed.  [provider]  fluticasone (FLONASE) 50 MCG/ACT nasal spray Place into both nostrils daily.    [provider]  furosemide (LASIX) 20 MG tablet Take 1 tablet (20 mg total) by mouth as needed. 07/05/22   Yates Decamp, MD  levothyroxine (SYNTHROID) 50 MCG tablet Take 50 mcg by mouth as needed. In am    [provider]  meclizine (ANTIVERT) 25 MG tablet Take 25 mg by mouth 3 (three) times daily as needed for dizziness.    [provider]  montelukast (SINGULAIR) 10 MG tablet TAKE 1 TABLET BY MOUTH EVERY DAY 02/25/22   Luciano Cutter, MD  Multiple Vitamin (MULTIVITAMIN WITH MINERALS) TABS tablet Take 1 tablet by mouth every other day.    [provider]  Peak Flow Meter DEVI 1 puff by Does not apply route in the morning and at bedtime. 05/12/20   Luciano Cutter, MD  Polyethyl Glycol-Propyl Glycol (SYSTANE) 0.4-0.3 % SOLN Place 1 drop into both eyes daily as needed (dry/irritated eyes.).    [provider]  potassium chloride (KLOR-CON) 10 MEQ tablet Take 1 tablet (10 mEq total) by mouth daily. Patient taking  differently: Take 10 mEq by mouth daily. Only takes with lasix 05/12/20 04/30/22  Cantwell, Celeste C, PA-C  propranolol (INDERAL) 20 MG tablet Take 1 tablet (20 mg total) by mouth 3 (three) times daily. 04/11/22   Yates Decamp, MD  SALINE NASAL SPRAY NA Place 1 spray into the nose daily.    [provider]  Tiotropium Bromide-Olodaterol (STIOLTO RESPIMAT) 2.5-2.5 MCG/ACT AERS INHALE 2 PUFFS BY MOUTH INTO THE LUNGS DAILY 02/26/22   Luciano Cutter, MD    Physical Exam: Vitals:   09/22/22 1546 09/22/22 1700 09/22/22 1715 09/22/22 1730  BP:  127/69 120/71 124/70  Pulse:  84 87 83  Resp:      Temp:      TempSrc:      SpO2:  99% 99% 100%  Weight: 52.2 kg     Height: 4\' 11"  (1.499 m)      Constitutional: Resting in bed with head elevated, NAD, calm, comfortable Eyes: Pupils remain dilated bilaterally after recent ophtho exam, lids and conjunctivae normal ENMT: Mucous membranes are moist. Posterior pharynx clear of any exudate or lesions.Normal dentition.  Neck: normal, supple, no masses. Respiratory: clear to auscultation bilaterally, no wheezing, no crackles. Normal respiratory effort. No accessory muscle use.  Cardiovascular: Regular rate and rhythm, systolic murmur present. No extremity edema. 2+ pedal pulses. Abdomen: no tenderness, no masses palpated.  Musculoskeletal: no clubbing / cyanosis. No joint deformity upper and lower extremities. Good ROM, no contractures. Normal muscle tone.  Skin: no rashes, lesions, ulcers. No induration Neurologic: CN 2-12 grossly intact. Sensation intact. Strength 5/5 in all 4.  Psychiatric: Normal judgment and insight. Alert and oriented x 3. Normal mood.   EKG: Personally reviewed. Sinus rhythm, rate 76, RAD.  Assessment/Plan Principal Problem:   CRAO (central retinal artery occlusion), left Active Problems:   CAD (coronary artery disease), native coronary artery   Hyperlipidemia   History of transcatheter aortic valve replacement (TAVR)    Centrilobular emphysema (HCC)   Hepatic cirrhosis (HCC)   Hypothyroidism   Chronic kidney disease, stage 3b (HCC)   [name] [name] is a 89 y.o. female with medical history significant for CAD s/p PCI on Plavix, severe AS s/p TAVR 2021, asthma/emphysema, hepatic cirrhosis with chronic transaminitis, CKD stage IIIb, HTN, HLD, hypothyroidism who is admitted for evaluation of CRAO of the  left eye.  Assessment and Plan: Central retinal artery occlusion of the left eye: Diagnosed in ophthalmology clinic by Dr. Charlotte Sanes and admitted for CVA workup. -Neurology to follow -CTA head/neck negative for LVO or significant stenosis -MRI brain negative for acute intracranial normality -Obtain echocardiogram -Continue atorvastatin 5 mg daily -Continue Plavix 75 mg daily (allergy to aspirin) -Check A1c and lipid panel -Keep on telemetry, continue neurochecks  Coronary artery disease s/p PCI: Stable, denies chest pain.  Continue atorvastatin, propranolol, and Plavix.  Asthma/emphysema: Stable without wheezing on admission.  Continue Stiolto, Singulair, and albuterol as needed.  Cirrhosis with chronically elevated LFTs: Stable without evidence of decompensated cirrhosis.  Follows with GI Dr. Bosie Clos.  CKD stage IIIb: Renal function stable.  Follows with CKD Dr. Thedore Mins.  Continue home Lasix 20 mg MWF.  Severe AS s/p TAVR 2021: Follows with cardiology Dr. Jacinto Halim.  Continue Plavix.  Hyperlipidemia: Continue atorvastatin 5 mg daily.  Hypothyroidism: Continue Synthroid.   DVT prophylaxis: heparin injection 5,000 Units Start: 09/22/22 2200 Code Status: DNR, confirmed with patient on admission Family Communication: Daughter at bedside Disposition Plan: From home and likely discharge to home pending clinical progress Consults called: Neurology Severity of Illness: The appropriate patient status for this patient is OBSERVATION. Observation status is judged to be reasonable and necessary in order to  provide the required intensity of service to ensure the patient's safety. The patient's presenting symptoms, physical exam findings, and initial radiographic and laboratory data in the context of their medical condition is felt to place them at decreased risk for further clinical deterioration. Furthermore, it is anticipated that the patient will be medically stable for discharge from the hospital within 2 midnights of admission.   Darreld Mclean MD [facility]  If 7PM-7AM, please contact night-coverage [email or web address]  09/22/2022, 8:26 PM

## 2022-09-22 NOTE — ED Provider Notes (Signed)
[place] EMERGENCY DEPARTMENT AT [facility] Provider Note   CSN: [id number] Arrival date & time: 09/22/22  1537     History  Chief Complaint  Patient presents with   Eye Problem    MIYOKO MOHSENI is a 89 y.o. female.  89 year old female coming from her ophthalmologist office for a diagnosis of a central retinal artery occlusion.  She started having vision trouble yesterday afternoon.   Eye Problem      Home Medications Prior to Admission medications   Medication Sig Start Date End Date Taking? Authorizing Provider  albuterol (VENTOLIN HFA) 108 (90 Base) MCG/ACT inhaler INHALE 2 PUFFS INTO THE LUNGS EVERY 6 (SIX) HOURS AS NEEDED FOR SHORTNESS OF BREATH (USE AS NEEDED OR BEFORE ACTIVITY FOR SHORTNESS OF BREATH OR WHEZZING) 10/01/21   Luciano Cutter, MD  atorvastatin (LIPITOR) 10 MG tablet Take 0.5 tablets (5 mg total) by mouth daily. 04/30/22 04/25/23  Yates Decamp, MD  Biotin 5 MG CAPS Take 500 mg by mouth daily.    [provider]  calcium-vitamin D (OSCAL WITH D) 250-125 MG-UNIT tablet Take 1 tablet by mouth daily.    [provider]  clopidogrel (PLAVIX) 75 MG tablet Take 1 tablet (75 mg total) by mouth daily. 12/21/21   Yates Decamp, MD  diphenhydrAMINE (BENADRYL) 25 mg capsule Take 25 mg by mouth every 6 (six) hours as needed.    [provider]  fluticasone (FLONASE) 50 MCG/ACT nasal spray Place into both nostrils daily.    [provider]  furosemide (LASIX) 20 MG tablet Take 1 tablet (20 mg total) by mouth as needed. 07/05/22   Yates Decamp, MD  levothyroxine (SYNTHROID) 50 MCG tablet Take 50 mcg by mouth as needed. In am    [provider]  meclizine (ANTIVERT) 25 MG tablet Take 25 mg by mouth 3 (three) times daily as needed for dizziness.    [provider]  montelukast (SINGULAIR) 10 MG tablet TAKE 1 TABLET BY MOUTH EVERY DAY 02/25/22   Luciano Cutter, MD  Multiple Vitamin (MULTIVITAMIN WITH MINERALS) TABS tablet  Take 1 tablet by mouth every other day.    [provider]  Peak Flow Meter DEVI 1 puff by Does not apply route in the morning and at bedtime. 05/12/20   Luciano Cutter, MD  Polyethyl Glycol-Propyl Glycol (SYSTANE) 0.4-0.3 % SOLN Place 1 drop into both eyes daily as needed (dry/irritated eyes.).    [provider]  potassium chloride (KLOR-CON) 10 MEQ tablet Take 1 tablet (10 mEq total) by mouth daily. Patient taking differently: Take 10 mEq by mouth daily. Only takes with lasix 05/12/20 04/30/22  Cantwell, Celeste C, PA-C  propranolol (INDERAL) 20 MG tablet Take 1 tablet (20 mg total) by mouth 3 (three) times daily. 04/11/22   Yates Decamp, MD  SALINE NASAL SPRAY NA Place 1 spray into the nose daily.    [provider]  Tiotropium Bromide-Olodaterol (STIOLTO RESPIMAT) 2.5-2.5 MCG/ACT AERS INHALE 2 PUFFS BY MOUTH INTO THE LUNGS DAILY 02/26/22   Luciano Cutter, MD      Allergies    Aspirin, Bee venom, Ceclor [cefaclor], Simvastatin, Pneumococcal vac polyvalent, Statins, Crestor [rosuvastatin], and Sulfa antibiotics    Review of Systems   Review of Systems  Physical Exam Updated Vital Signs BP 124/70   Pulse 83   Temp 98.3 F (36.8 C) (Oral)   Resp 16   Ht 4\' 11"  (1.499 m)   Wt 52.2 kg  SpO2 100%   BMI 23.23 kg/m  Physical Exam Vitals reviewed.  Constitutional:      Appearance: Normal appearance.  HENT:     Head: Normocephalic and atraumatic.  Skin:    General: Skin is warm and dry.  Neurological:     Mental Status: She is alert.     ED Results / Procedures / Treatments   Labs (all labs ordered are listed, but only abnormal results are displayed) Labs Reviewed  CBC - Abnormal; Notable for the following components:      Result Value   RBC 3.74 (*)    Hemoglobin 11.6 (*)    All other components within normal limits  COMPREHENSIVE METABOLIC PANEL - Abnormal; Notable for the following components:   Sodium 133 (*)    CO2 21 (*)    Creatinine, Ser  1.21 (*)    Calcium 8.6 (*)    Albumin 3.2 (*)    AST 105 (*)    ALT 71 (*)    Alkaline Phosphatase 347 (*)    GFR, Estimated 43 (*)    All other components within normal limits  I-STAT CHEM 8, ED - Abnormal; Notable for the following components:   Creatinine, Ser 1.30 (*)    Calcium, Ion 1.09 (*)    All other components within normal limits  PROTIME-INR  APTT  DIFFERENTIAL  ETHANOL  CBG MONITORING, ED    EKG None  Radiology No results found.  Procedures Procedures    Medications Ordered in ED Medications  sodium chloride flush (NS) 0.9 % injection 3 mL (3 mLs Intravenous Not Given 09/22/22 1657)  iohexol (OMNIPAQUE) 350 MG/ML injection 75 mL (75 mLs Intravenous Contrast Given 09/22/22 1702)    ED Course/ Medical Decision Making/ A&P                                 Medical Decision Making 89 year old female here today with a diagnosis of a central retinal artery occlusion by an ophthalmologist outpatient.  Plan-upon my assessment the patient, she does have some visual field deficits in her left eye in the inferior portions.  Spoke with the patient's ophthalmologist, Dr. Charlotte Sanes.  She says that it was a visible central retinal artery occlusion.  She is concerned for cholesterol emboli.  1 patient sent to the emergency department for stroke evaluation and to assess for the cause of the patient's occlusive process.  Have sent a neurology consultation.  Patient is outside of window for meaningful intervention.  Reassessment-spoke with neurology who recommended medical admission for stroke workup.  Will admit.  Amount and/or Complexity of Data Reviewed Labs: ordered. Radiology: ordered.  Risk Prescription drug management.           Final Clinical Impression(s) / ED Diagnoses Final diagnoses:  Central retinal artery occlusion of left eye    Rx / DC Orders ED Discharge Orders     None         Arletha Pili, DO 09/22/22 1815

## 2022-09-22 NOTE — Telephone Encounter (Signed)
She has had mixed Carotid artery duplex reports and maybe best evaluated by CTA. Will see

## 2022-09-22 NOTE — Consult Note (Incomplete)
Neurology Consultation Reason for Consult: Central retinal artery occlusion Referring Physician: Terrilee Croak  CC: Vision change  History is obtained from:***  HPI: [name] [name] is a 89 y.o. female ***   LKW: *** tnk given?: no, *** Premorbid modified rankin scale: *** ICH Score: ***   Past Medical History:  Diagnosis Date   Arthritis    Asthma    history of   CAD (coronary artery disease)    Cirrhosis (HCC)    CKD (chronic kidney disease)    Dyspnea    sometimes sitting.lying and on exertion   Emphysema lung (HCC)    GERD (gastroesophageal reflux disease)    History of hiatal hernia    History of transcatheter aortic valve replacement (TAVR) 01/26/2019   (TAVR  23 mm Evolute Pro in aortic position 01/26/2019) .    Hyperlipidemia    Hypertension    Hypothyroidism    Lipoma    left arm   Nasal polyps    Pneumonia    Pulmonary nodule    noted on pre TAVR CT   Samter's triad    Severe aortic stenosis      Family History  Problem Relation Age of Onset   Tuberculosis Mother    Other Father        Bronchiectasis   Sarcoidosis Sister    Dementia Sister    Heart disease Brother    Heart attack Brother    Cancer Maternal Grandmother        ovarian   Asthma Maternal Grandfather    Breast cancer Daughter    Brain cancer Daughter      Social History:  reports that she has never smoked. She has never used smokeless tobacco. She reports that she does not drink alcohol and does not use drugs.   Exam: Current vital signs: BP (!) 140/73 (BP Location: Right Arm)   Pulse 83   Temp 98 F (36.7 C) (Oral)   Resp 18   Ht 4\' 11"  (1.499 m)   Wt 52.2 kg   SpO2 99%   BMI 23.23 kg/m  Vital signs in last 24 hours: Temp:  [98 F (36.7 C)-98.3 F (36.8 C)] 98 F (36.7 C) (09/01 2113) Pulse Rate:  [76-87] 83 (09/01 2113) Resp:  [16-18] 18 (09/01 2113) BP: (120-145)/(69-76) 140/73 (09/01 2113) SpO2:  [97 %-100 %] 99 % (09/01 2113) Weight:  [52.2 kg] 52.2 kg (09/01  1546)   Physical Exam  Appears well-developed and well-nourished.   Neuro: Mental Status: Patient is awake, alert, oriented to person, place, month, year, and situation.*** Patient is able to give a clear and coherent history.*** No signs of aphasia or neglect*** Cranial Nerves: II: Visual Fields are full. Pupils are equal, round, and reactive to light.  *** III,IV, VI: EOMI without ptosis or diploplia.  V: Facial sensation is symmetric to temperature VII: Facial movement is symmetric.  VIII: hearing is intact to voice X: Uvula elevates symmetrically XI: Shoulder shrug is symmetric. XII: tongue is midline without atrophy or fasciculations.  Motor: Tone is normal. Bulk is normal. 5/5 strength was present in all four extremities. *** Sensory: Sensation is symmetric to light touch and temperature in the arms and legs.*** Deep Tendon Reflexes: 2+ and symmetric in the biceps and patellae. *** Plantars: Toes are downgoing bilaterally. *** Cerebellar: FNF and HKS are intact bilaterally***      I have reviewed labs in epic and the results pertinent to this consultation are: Cr 1.21  I have  reviewed the images obtained:CTA - negative  MRI - negative  Impression: ***  Recommendations: 1) ***   Ritta Slot, MD [facility] [phone]  If 7pm- 7am, please page neurology on call as listed in AMION.

## 2022-09-23 ENCOUNTER — Observation Stay (HOSPITAL_COMMUNITY): Payer: Federal, State, Local not specified - PPO

## 2022-09-23 DIAGNOSIS — H3412 Central retinal artery occlusion, left eye: Principal | ICD-10-CM

## 2022-09-23 LAB — LIPID PANEL
Cholesterol: 175 mg/dL (ref 0–200)
HDL: 63 mg/dL (ref >40)
LDL Cholesterol: 99 mg/dL (ref 0–99)
Total CHOL/HDL Ratio: 2.8 ratio
Triglycerides: 65 mg/dL (ref <150)
VLDL: 13 mg/dL (ref 0–40)

## 2022-09-23 LAB — BASIC METABOLIC PANEL
Anion gap: 8 (ref 5–15)
BUN: 16 mg/dL (ref 8–23)
CO2: 27 mmol/L (ref 22–32)
Calcium: 8.5 mg/dL — ABNORMAL LOW (ref 8.9–10.3)
Chloride: 98 mmol/L (ref 98–111)
Creatinine, Ser: 1.21 mg/dL — ABNORMAL HIGH (ref 0.44–1.00)
GFR, Estimated: 43 mL/min — ABNORMAL LOW (ref >60)
Glucose, Bld: 92 mg/dL (ref 70–99)
Potassium: 3.7 mmol/L (ref 3.5–5.1)
Sodium: 133 mmol/L — ABNORMAL LOW (ref 135–145)

## 2022-09-23 LAB — ECHOCARDIOGRAM COMPLETE
AR max vel: 1.9 cm2
AV Area VTI: 2.01 cm2
AV Area mean vel: 1.94 cm2
AV Mean grad: 9 mmHg
AV Peak grad: 14.6 mmHg
Ao pk vel: 1.91 m/s
Area-P 1/2: 3.08 cm2
Height: 59 in
MV M vel: 1.17 m/s
MV Peak grad: 5.5 mmHg
S' Lateral: 1.9 cm
Weight: 1777.79 oz

## 2022-09-23 LAB — CBC
HCT: 34.6 % — ABNORMAL LOW (ref 36.0–46.0)
Hemoglobin: 11.3 g/dL — ABNORMAL LOW (ref 12.0–15.0)
MCH: 31.4 pg (ref 26.0–34.0)
MCHC: 32.7 g/dL (ref 30.0–36.0)
MCV: 96.1 fL (ref 80.0–100.0)
Platelets: 176 10*3/uL (ref 150–400)
RBC: 3.6 MIL/uL — ABNORMAL LOW (ref 3.87–5.11)
RDW: 14.3 % (ref 11.5–15.5)
WBC: 7.3 10*3/uL (ref 4.0–10.5)
nRBC: 0 % (ref 0.0–0.2)

## 2022-09-23 LAB — HEMOGLOBIN A1C
Hgb A1c MFr Bld: 6.2 % — ABNORMAL HIGH (ref 4.8–5.6)
Mean Plasma Glucose: 131.24 mg/dL

## 2022-09-23 LAB — SEDIMENTATION RATE: Sed Rate: 76 mm/h — ABNORMAL HIGH (ref 0–22)

## 2022-09-23 LAB — C-REACTIVE PROTEIN: CRP: 0.9 mg/dL (ref <1.0)

## 2022-09-23 MED ORDER — ATORVASTATIN CALCIUM 10 MG PO TABS: 20.0000 mg | ORAL_TABLET | Freq: Every day | ORAL | Status: DC

## 2022-09-23 MED ORDER — ACETAMINOPHEN 325 MG PO TABS: 650.0000 mg | ORAL_TABLET | Freq: Four times a day (QID) | ORAL | Status: DC | PRN

## 2022-09-23 MED ORDER — ATORVASTATIN CALCIUM 20 MG PO TABS: 20.0000 mg | ORAL_TABLET | Freq: Every day | ORAL | 2 refills | Status: DC

## 2022-09-23 NOTE — Progress Notes (Signed)
Transition of Care [facility]) - Inpatient Brief Assessment   Patient Details  Name: [name] [name] MRN: [id number] Date of Birth: [birth year]  Transition of [facility]) CM/SW Contact:    Janae Bridgeman, RN Phone Number: 09/23/2022, 3:19 PM   Clinical Narrative: Patient admitted for Central Retinal artery occlusion.  No TOC needs.  Patient will discharge home with family today.   Transition of Care Asessment: Insurance and Status: (P) Insurance coverage has been reviewed Patient has primary care physician: (P) Yes Home environment has been reviewed: (P) from home   Prior/Current Home Services: (P) No current home services Social Determinants of Health Reivew: (P) SDOH reviewed no interventions necessary Readmission risk has been reviewed: (P) Yes Transition of care needs: (P) no transition of care needs at this time

## 2022-09-23 NOTE — Progress Notes (Signed)
PT Cancellation Note  Patient Details Name: [name] [name] MRN: [id number] DOB: 1931-06-13   Cancelled Treatment:    Reason Eval/Treat Not Completed: PT screened, no needs identified, will sign off (per OT pt Mod I and does not require PT at this time, will screen)   Elayne Gruver B Hermann Dottavio 09/23/2022, 9:57 AM Merryl Hacker, PT Acute Rehabilitation Services Office: [phone]

## 2022-09-23 NOTE — Evaluation (Signed)
Occupational Therapy Evaluation Patient Details Name: [name] [name] MRN: [id number] DOB: Nov 14, 1931 Today's Date: 09/23/2022   History of Present Illness 89 yo female admitted 9/1 after visit to ophthalmologist with central retinal artery occlusion for stroke workup with CTA and MRI negative. PMhx: HTN, HLD, AS s/p TAVR, hiatal hernia, LUE lipoma, GERD, CKD, Cirrhosis, CAD   Clinical Impression   PTA, pt lived with her husband whom she provided supervision assist for ADL and assisted with all IADL. Upon eval, pt with decr visual acuity in L upper quadrant of L eye in which pt describes as a hazy gray appearance. Pt with good natural use of compensatory techniques; able to read with increased time for locating starting place and going to next line. Performing 4 step path finding in hall.  Pt reports she feels the space of gray haze is smaller today. Eye mobility otherwise [facility] When discussing routines/roles, reporting poor activity tolerance. Will bring education in another session as time allows on energy conservation and continue review of compensatory techniques.Recommended follow up with ophthalmologist. No OT follow up recommended at discharge unless ophthalmologist would need pt to see a driving specialist prior to return to drive.       If plan is discharge home, recommend the following: Assist for transportation;Other (comment) (on request; suggested ask MD about return to drive)    Functional Status Assessment  Patient has had a recent decline in their functional status and demonstrates the ability to make significant improvements in function in a reasonable and predictable amount of time.  Equipment Recommendations  None recommended by OT    Recommendations for Other Services Other (comment) (opthalmologist follow up after dc)     Precautions / Restrictions        Mobility Bed Mobility Overal bed mobility: Modified Independent                  Transfers Overall  transfer level: Modified independent Equipment used: None                      Balance Overall balance assessment: Independent                                         ADL either performed or assessed with clinical judgement   ADL Overall ADL's : Modified independent                                       General ADL Comments: Mod I for increased time and use of compensatory techniques     Vision Baseline Vision/History: 1 Wears glasses (R eye central visual impairment at baseline.) Ability to See in Adequate Light: 1 Impaired Patient Visual Report: Other (comment);Peripheral vision impairment (Pt reports L eye upper temporal field decresaed acuity described as gray/hazy) Vision Assessment?: Vision impaired- to be further tested in functional context;Yes Eye Alignment: Within Functional Limits Ocular Range of Motion: Within Functional Limits Tracking/Visual Pursuits: Able to track stimulus in all quads without difficulty Saccades:  (with incresaed time) Visual Fields: Other (comment) (Pt able to detect stimulus in all quads, but reports less clear in upper L field in L eye only.) Additional Comments: recommended further follow up with opthalmologist prior to return to drive unless otherwise instructed. Provided education regarding visual compensatory techniques for ADL  and IADL as well as using comfort settings on computer screen and compensatory techniques     Perception Perception: Not tested       Praxis Praxis: Not tested       Pertinent Vitals/Pain Pain Assessment Pain Assessment: Faces Faces Pain Scale: No hurt Pain Intervention(s): Monitored during session     Extremity/Trunk Assessment Upper Extremity Assessment Upper Extremity Assessment: Generalized weakness   Lower Extremity Assessment Lower Extremity Assessment: Generalized weakness   Cervical / Trunk Assessment Cervical / Trunk Assessment: Normal   Communication  Communication Communication: No apparent difficulties   Cognition Arousal: Alert Behavior During Therapy: WFL for tasks assessed/performed Overall Cognitive Status: Within Functional Limits for tasks assessed                                       General Comments  VSS    Exercises     Shoulder Instructions      Home Living Family/patient expects to be discharged to:: Private residence Living Arrangements: Spouse/significant other (spouse with advanced dementia; is at [facility] for rehab currently) Available Help at Discharge: Family;Available PRN/intermittently (children) Type of Home: House Home Access: Other (comment) (threshold)     Home Layout: Two level;Able to live on main level with bedroom/bathroom     Bathroom Shower/Tub: Walk-in shower ("small")   Bathroom Toilet: Standard     Home Equipment: Wheelchair - manual;BSC/3in1 (knee scooter)   Additional Comments: 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; sets out his clothing, assists with all IADL, needing frequent supervision      Prior Functioning/Environment Prior Level of Function : Independent/Modified Independent;Driving             Mobility Comments: no AD ADLs Comments: Mod I needing rest breaks throughout the day        OT Problem List: Impaired vision/perception      OT Treatment/Interventions: Self-care/ADL training;Energy conservation;Therapeutic activities;Patient/family education;Balance training    OT Goals(Current goals can be found in the care plan section) Acute Rehab OT Goals Patient Stated Goal: better vision OT Goal Formulation: With patient Time For Goal Achievement: 10/07/22 Potential to Achieve Goals: Good  OT Frequency: Other (comment) (one more session for education on energy conservation and compensatory techniques for vision during IADL)    Co-evaluation              AM-PAC OT "6 Clicks" Daily Activity     Outcome Measure Help from  another person eating meals?: None Help from another person taking care of personal grooming?: None Help from another person toileting, which includes using toliet, bedpan, or urinal?: None Help from another person bathing (including washing, rinsing, drying)?: None Help from another person to put on and taking off regular upper body clothing?: None Help from another person to put on and taking off regular lower body clothing?: None 6 Click Score: 24   End of Session Equipment Utilized During Treatment: Gait belt Nurse Communication: Mobility status  Activity Tolerance: Patient tolerated treatment well Patient left: in bed;with call bell/phone within reach  OT Visit Diagnosis: Muscle weakness (generalized) (M62.81);Low vision, both eyes (H54.2)                Time: 0932-3557 OT Time Calculation (min): 32 min Charges:  OT General Charges $OT Visit: 1 Visit OT Evaluation $OT Eval Low Complexity: 1 Low OT Treatments $Self Care/Home Management : 8-22 mins  Tyler Deis, OTR/L [facility] Acute Rehabilitation Office: [phone]   Myrla Halsted 09/23/2022, 10:07 AM

## 2022-09-23 NOTE — Progress Notes (Addendum)
STROKE TEAM PROGRESS NOTE   BRIEF HPI [name] is a 89 y.o. female with history of CAD status post PCI on Plavix 75 mg, severe AAS status post catheter 2021, COPD, hepatic cirrhosis, CKD stage IIIb, hypertension, hyperlipidemia, hypothyroidism who presented to the ED on referral from ophthalmologist office after being discovered to have a central retinal occlusion of left eye.  MRI brain negative for intracranial abnormality, CTA head and neck negative for carotid stenosis/or occlusion.  Denies concurrent headache, palpitations, jaw pain, dizziness, confusion, difficulty speaking.  SIGNIFICANT HOSPITAL EVENTS 9/2: MRI brain, CTA head and neck negative.  Echo: No significant change from 2022 echo.  Moderate dilation of left atrium.  EKG: NSR.  INTERIM HISTORY/SUBJECTIVE  No acute events overnight.  PRNs: Senna docusate 1 tablet x1 for mild constipation.  No medication refusals.  On interview, patient is sitting up in a chair speaking with her daughter on speakerphone.  Patient describes sitting in passenger car seat when suddenly she lost some of her vision in her left eye.  Describes vision loss as "grayish", with a distribution shown below:  Patient denies any concurrent symptoms at time of vision loss, including: Pain, dizziness, confused thinking, headache, or jaw pain/stiffness.  Patient and daughter both voiced worry of embolic source-this is the reason she presented directly from her ophthalmologist.  Patient takes her medications regularly, and never misses a dose.   OBJECTIVE  CBC    Component Value Date/Time   WBC 7.3 09/23/2022 0224   RBC 3.60 (L) 09/23/2022 0224   HGB 11.3 (L) 09/23/2022 0224   HGB 12.1 11/23/2021 1435   HGB 12.2 07/26/2021 1222   HCT 34.6 (L) 09/23/2022 0224   HCT 36.7 07/26/2021 1222   PLT 176 09/23/2022 0224   PLT 216 11/23/2021 1435   PLT 209 07/26/2021 1222   MCV 96.1 09/23/2022 0224   MCV 93 07/26/2021 1222   MCH 31.4 09/23/2022 0224    MCHC 32.7 09/23/2022 0224   RDW 14.3 09/23/2022 0224   RDW 12.5 07/26/2021 1222   LYMPHSABS 2.1 09/22/2022 1547   MONOABS 0.8 09/22/2022 1547   EOSABS 0.1 09/22/2022 1547   BASOSABS 0.1 09/22/2022 1547    BMET    Component Value Date/Time   NA 133 (L) 09/23/2022 0224   NA 132 (L) 07/26/2021 1222   K 3.7 09/23/2022 0224   CL 98 09/23/2022 0224   CO2 27 09/23/2022 0224   GLUCOSE 92 09/23/2022 0224   BUN 16 09/23/2022 0224   BUN 27 07/26/2021 1222   CREATININE 1.21 (H) 09/23/2022 0224   CREATININE 1.52 (H) 11/23/2021 1435   CALCIUM 8.5 (L) 09/23/2022 0224   EGFR 37 (L) 07/26/2021 1222   GFRNONAA 43 (L) 09/23/2022 0224   GFRNONAA 33 (L) 11/23/2021 1435    IMAGING past 24 hours ECHOCARDIOGRAM COMPLETE  Result Date: 09/23/2022    ECHOCARDIOGRAM REPORT   Patient Name:   [name] [name] Date of Exam: 09/23/2022 Medical Rec #:  [id number]     Height:       59.0 in Accession #:    [id number]    Weight:       111.1 lb Date of Birth:  [birth year]     BSA:          1.437 m Patient Age:    89 years      BP:           133/82 mmHg Patient Gender: F  HR:           74 bpm. Exam Location:  Inpatient Procedure: 2D Echo, Cardiac Doppler and Color Doppler Indications:     Stroke I63.9  History:         Patient has prior history of Echocardiogram examinations, most                  recent 05/05/2020. CAD, Cirrhosis, Aortic Valve Disease; Risk                  Factors:Dyslipidemia and Non-Smoker.                  Aortic Valve: 23 mm CoreValve-EvolutR prosthetic, stented                  (TAVR) valve is present in the aortic position. Procedure Date:                  01/26/19.  Sonographer:     Aron Baba Referring Phys:  Charlsie Quest Diagnosing Phys: Yates Decamp MD  Sonographer Comments: Suboptimal subcostal window. Image acquisition challenging due to respiratory motion. IMPRESSIONS  1. Diastology may not be accurate in presence of MV and MV annular calcification. Left ventricular ejection fraction, by  estimation, is 60 to 65%. The left ventricle has normal function. The left ventricle has no regional wall motion abnormalities. There is mild left ventricular hypertrophy. Left ventricular diastolic parameters are consistent with Grade II diastolic dysfunction (pseudonormalization). Elevated left ventricular end-diastolic pressure.  2. Right ventricular systolic function is normal. The right ventricular size is normal. There is normal pulmonary artery systolic pressure. The estimated right ventricular systolic pressure is 26.3 mmHg.  3. Left atrial size was moderately dilated.  4. The mitral valve is degenerative. Mild mitral valve regurgitation. No evidence of mitral stenosis. Moderate mitral annular calcification.  5. Normal TAVR function. The aortic valve is tricuspid. Aortic valve regurgitation is not visualized. Aortic valve sclerosis is present, with no evidence of aortic valve stenosis. There is a 23 mm CoreValve-EvolutR prosthetic (TAVR) valve present in the  aortic position. Procedure Date: 01/26/19. Comparison(s): Compared to the study 05/05/2020, Amild AV perivalvular leak not seen in the present study. FINDINGS  Left Ventricle: Diastology may not be accurate in presence of MV and MV annular calcification. Left ventricular ejection fraction, by estimation, is 60 to 65%. The left ventricle has normal function. The left ventricle has no regional wall motion abnormalities. The left ventricular internal cavity size was normal in size. There is mild left ventricular hypertrophy. Left ventricular diastolic parameters are consistent with Grade II diastolic dysfunction (pseudonormalization). Elevated left ventricular end-diastolic pressure. Right Ventricle: The right ventricular size is normal. No increase in right ventricular wall thickness. Right ventricular systolic function is normal. There is normal pulmonary artery systolic pressure. The tricuspid regurgitant velocity is 2.14 m/s, and  with an assumed right  atrial pressure of 8 mmHg, the estimated right ventricular systolic pressure is 26.3 mmHg. Left Atrium: Left atrial size was moderately dilated. Right Atrium: Right atrial size was normal in size. Pericardium: There is no evidence of pericardial effusion. Mitral Valve: The mitral valve is degenerative in appearance. There is mild thickening of the mitral valve leaflet(s). There is mild calcification of the mitral valve leaflet(s). Moderate mitral annular calcification. Mild mitral valve regurgitation. No evidence of mitral valve stenosis. Tricuspid Valve: The tricuspid valve is normal in structure. Tricuspid valve regurgitation is mild . No evidence of tricuspid stenosis. Aortic Valve:  Normal TAVR function. The aortic valve is tricuspid. Aortic valve regurgitation is not visualized. Aortic valve sclerosis is present, with no evidence of aortic valve stenosis. Aortic valve mean gradient measures 9.0 mmHg. Aortic valve peak  gradient measures 14.6 mmHg. Aortic valve area, by VTI measures 2.01 cm. There is a 23 mm CoreValve-EvolutR prosthetic, stented (TAVR) valve present in the aortic position. Procedure Date: 01/26/19. Pulmonic Valve: The pulmonic valve was normal in structure. Pulmonic valve regurgitation is trivial. No evidence of pulmonic stenosis. Aorta: The aortic root is normal in size and structure. IAS/Shunts: No atrial level shunt detected by color flow Doppler.  LEFT VENTRICLE PLAX 2D LVIDd:         2.90 cm   Diastology LVIDs:         1.90 cm   LV e' medial:    6.20 cm/s LV PW:         0.90 cm   LV E/e' medial:  17.7 LV IVS:        0.70 cm   LV e' lateral:   4.24 cm/s LVOT diam:     2.20 cm   LV E/e' lateral: 25.9 LV SV:         78 LV SV Index:   54 LVOT Area:     3.80 cm  RIGHT VENTRICLE RV S prime:     12.40 cm/s TAPSE (M-mode): 2.0 cm LEFT ATRIUM           Index        RIGHT ATRIUM           Index LA diam:      2.40 cm 1.67 cm/m   RA Area:     11.90 cm LA Vol (A2C): 18.3 ml 12.74 ml/m  RA Volume:    21.90 ml  15.24 ml/m LA Vol (A4C): 42.3 ml 29.44 ml/m  AORTIC VALVE                     PULMONIC VALVE AV Area (Vmax):    1.90 cm      PR End Diast Vel: 4.39 msec AV Area (Vmean):   1.94 cm AV Area (VTI):     2.01 cm AV Vmax:           191.25 cm/s AV Vmean:          133.625 cm/s AV VTI:            0.388 m AV Peak Grad:      14.6 mmHg AV Mean Grad:      9.0 mmHg LVOT Vmax:         95.40 cm/s LVOT Vmean:        68.100 cm/s LVOT VTI:          0.205 m LVOT/AV VTI ratio: 0.53  AORTA Ao Root diam: 2.50 cm Ao Asc diam:  3.20 cm MITRAL VALVE                TRICUSPID VALVE MV Area (PHT): 3.08 cm     TR Peak grad:   18.3 mmHg MV Decel Time: 246 msec     TR Vmax:        214.00 cm/s MR Peak grad: 5.5 mmHg MR Vmax:      117.00 cm/s   SHUNTS MV E velocity: 110.00 cm/s  Systemic VTI:  0.20 m MV A velocity: 140.00 cm/s  Systemic Diam: 2.20 cm MV E/A ratio:  0.79 Yates Decamp MD Electronically signed by Yates Decamp  MD Signature Date/Time: 09/23/2022/12:31:44 PM    Final    MR BRAIN WO CONTRAST  Result Date: 09/22/2022 CLINICAL DATA:  Initial evaluation for neuro deficit, stroke suspected. EXAM: MRI HEAD WITHOUT CONTRAST TECHNIQUE: Multiplanar, multiecho pulse sequences of the brain and surrounding structures were obtained without intravenous contrast. COMPARISON:  Prior CT from earlier the same day. FINDINGS: Brain: Mild age-related cerebral atrophy. Patchy T2/FLAIR hyperintensity involving the periventricular deep white matter both cerebral hemispheres, consistent with chronic small vessel ischemic disease, mild for age. No evidence for acute or subacute ischemia. Gray-white matter differentiation maintained. No acute intracranial hemorrhage. Few punctate chronic micro hemorrhages noted, likely small vessel/hypertensive in nature. No mass lesion, midline shift or mass effect. No hydrocephalus or extra-axial fluid collection. Pituitary gland suprasellar region within normal limits. Vascular: Major intracranial vascular flow voids  are maintained. Skull and upper cervical spine: Craniocervical junction within normal limits. Bone marrow signal intensity normal. No scalp soft tissue abnormality. Sinuses/Orbits: Prior bilateral ocular lens replacement. Changes of chronic pansinusitis with sequelae of prior endoscopic sinonasal surgery noted. No mastoid effusion. Other: None. IMPRESSION: 1. No acute intracranial abnormality. 2. Mild age-related cerebral atrophy with chronic small vessel ischemic disease. 3. Changes of chronic pansinusitis with sequelae of prior endoscopic sinonasal surgery. Electronically Signed   By: Rise Mu M.D.   On: 09/22/2022 19:56   CT ANGIO HEAD NECK W WO CM  Result Date: 09/22/2022 CLINICAL DATA:  Stroke/TIA, determine embolic source EXAM: CT ANGIOGRAPHY HEAD AND NECK WITH AND WITHOUT CONTRAST TECHNIQUE: Multidetector CT imaging of the head and neck was performed using the standard protocol during bolus administration of intravenous contrast. Multiplanar CT image reconstructions and MIPs were obtained to evaluate the vascular anatomy. Carotid stenosis measurements (when applicable) are obtained utilizing NASCET criteria, using the distal internal carotid diameter as the denominator. RADIATION DOSE REDUCTION: This exam was performed according to the departmental dose-optimization program which includes automated exposure control, adjustment of the mA and/or kV according to patient size and/or use of iterative reconstruction technique. CONTRAST:  75mL OMNIPAQUE IOHEXOL 350 MG/ML SOLN COMPARISON:  None Available. FINDINGS: CT HEAD FINDINGS Brain: No evidence of acute large vascular territory infarction, hemorrhage, hydrocephalus, extra-axial collection or mass lesion/mass effect. Vascular: See below. Skull: No acute fracture. Sinuses/Orbits: Prior endoscopic sinus surgery. Maxillary sinus mucosal thickening and near complete opacification of the sphenoid sinuses. No acute orbital findings. Other: No mastoid  effusions. Review of the MIP images confirms the above findings CTA NECK FINDINGS Aortic arch: Great vessel origins are patent without significant stenosis. Aortic atherosclerosis. Right carotid system: No evidence of dissection, stenosis (50% or greater), or occlusion. Motion limited evaluation. Left carotid system: No evidence of dissection, stenosis (50% or greater), or occlusion. Motion limited evaluation. Vertebral arteries: Codominant. No evidence of dissection, stenosis (50% or greater), or occlusion. Skeleton: No acute abnormality on limited assessment. Multilevel degenerative change in the visualized spine. Other neck: No acute abnormality on motion limited assessment. Subcentimeter left thyroid nodule which does not require further imaging follow-up (ref: J Am Coll Radiol. 2015 Feb;12(2): 143-50). Upper chest: Biapical pleuroparenchymal scarring Review of the MIP images confirms the above findings CTA HEAD FINDINGS Anterior circulation: Bilateral intracranial ICAs, MCAs and ACAs are patent without proximal hemodynamically significant stenosis. Posterior circulation: Bilateral intradural vertebral arteries, basilar artery and bilateral posterior cerebral arteries are patent without proximal hemodynamically significant stenosis. Venous sinuses: As permitted by contrast timing, patent. Review of the MIP images confirms the above findings IMPRESSION: No large vessel occlusion or proximal  hemodynamically significant stenosis. Electronically Signed   By: Feliberto Harts M.D.   On: 09/22/2022 18:25    Vitals:   09/23/22 0405 09/23/22 0740 09/23/22 0925 09/23/22 1400  BP: 137/76 133/82  118/74  Pulse: 78 80 81   Resp: 18 18 16    Temp: 98.2 F (36.8 C) 97.6 F (36.4 C)  97.6 F (36.4 C)  TempSrc: Oral Oral  Oral  SpO2: 98% 96% 97% 100%  Weight: 50.4 kg     Height:         PHYSICAL EXAM General:  Alert, well-nourished, well-developed patient in no acute distress Psych:  Mood and affect  appropriate for situation CV: Regular rate and rhythm on monitor Respiratory:  Regular, unlabored respirations on room air GI: Abdomen soft and nontender   NEURO:  Mental Status: AA&Ox3, patient is able to give clear and coherent history Speech/Language: speech is without dysarthria or aphasia.  Naming, repetition, fluency, and comprehension intact.  Cranial Nerves:  II: PERRL. Visual fields full.  III, IV, VI: EOMI. Eyelids elevate symmetrically.  V: Sensation is intact to light touch and symmetrical to face.  VII: Face is symmetrical resting and smiling VIII: hearing intact to voice. IX, X: Palate elevates symmetrically. Phonation is normal.  NF:[id number] shrug 5/5. XII: tongue is midline without fasciculations. Motor: 5/5 strength to all muscle groups tested.  Tone: is normal and bulk is normal Sensation: Intact to light touch bilaterally. Extinction absent to light touch to DSS.   Coordination: FTN intact bilaterally, HKS: no ataxia in BLE.No drift.  Gait: Deferred   ASSESSMENT/PLAN  Left branch retinal artery occlusion, etiology likely focal retinal artery atherosclerosis  Patient denies headache, diplopia, scalp, jaw claudication CTA head and neck unremarkable MRI brain negative for acute process, mild age-related cerebral atrophy with chronic small vessel ischemic disease. LDL 99 HgbA1c 6.2 ESR 76 CRP negative VTE prophylaxis -heparin subcu Plavix prior to admission, now on home Plavix in the setting of aspirin allergy Therapy recommendations:  No follow up needed Disposition: Home  Hypertension Home meds: Propranolol 20 mg 3 times daily Stable Long-term BP goal normotensive  Hyperlipidemia Home meds: Atorvastatin 5 mg daily LDL 99, goal < 70 Increased Lipitor to 20 mg daily Continue new statin dose statin at discharge  Other Stroke Risk Factors Advanced age Coronary artery disease: Continue beta-blocker, statin, Plavix.  Other Active Problems COPD,  well-controlled Cirrhosis: Well-controlled CKD stage IIIb: Renal function at baseline Severe aortic stenosis status post TAVR: No change Hypothyroidism: Continue Synthroid  Hospital day # 0   ATTENDING NOTE: I reviewed above note and agree with the assessment and plan. Pt was seen and examined.   Daughter is on the phone. Pt sitting in chair, neuro exam normal except left eye upper half visual field blurry vision. Patient denies headache, diplopia, scalp, jaw claudication, although ESR 76, but CRP negative, no concern for temporal arteritis at this time.  Etiology for patient left BRAO likely from focal retinal artery atherosclerosis.  Continue home Plavix, increase Lipitor from 5 to 20.  Continue follow-up with ophthalmology as outpatient.  For detailed assessment and plan, please refer to above/below as I have made changes wherever appropriate.   Neurology will sign off. Please call with questions. Pt will follow up with stroke clinic NP at [facility] in about 4 weeks. Thanks for the consult.   Marvel Plan, MD PhD Stroke Neurology 09/23/2022 5:41 PM   To contact Stroke Continuity provider, please refer to [email or web address]. After hours, contact General Neurology

## 2022-09-23 NOTE — Discharge Summary (Signed)
DISCHARGE SUMMARY  CHRISS JANISH  MR#: [id number]  DOB:Dec 30, 1931  Date of Admission: 09/22/2022 Date of Discharge: 09/23/2022  Attending Physician:Burleigh Brockmann Silvestre Gunner, MD  Patient's EXB:[id number], Kasandra Knudsen, MD  Disposition: D/C home   Follow-up Appts:  Follow-up Information     Pahwani, Rinka R, MD Follow up in 10 day(s).   Specialty: Internal Medicine Contact information: [address] [place] [address] [phone]         Maris Berger, MD Follow up in 7 day(s).   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 She developed acute visual changes the day prior around 3 PM and presented to her ophthalmologist the following day when her symptoms persisted. Exam was most consistent with CRAO with concern for cholesterol emboli, and the patient was therefore referred to the ER.   Hospital Course:  Branch versus Central retinal artery occlusion OS Diagnosed in the ophthalmology clinic by Dr. 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 Charlotte Sanes as outpatient in 7-10 days    CVA evaluation  -no obvious source of embolization found - no clear  source of CVA apart from small vessel disease  -MRI brain negative for acute intracranial abnormality -CTa head and neck negative for LVO or significant stenosis -TTE noted preserved EF with normal LV function and no WMA with grade 2 DD, normal TAVR function without stenosis, and no intracardiac source of potential embolization appreciated -medical treatment: Continue Plavix in patient who is allergic to aspirin -LDL 99 - continue atorvastatin, with attempt to increase dose to 20mg  (discusssed w/ patient) -A1c 6.2 -PT/OT feel patient does not need ongoing services post-D/C    CAD with history of PCI Continue beta-blocker, statin, and Plavix -asymptomatic   COPD Well-controlled during this admission   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 Jacinto Halim - no evidence of decompensation -valve appears stable TTE   HLD Attempt to increase Lipitor dose to 20 mg as noted above   Hypothyroidism Continue usual Synthroid dose  Allergies as of 09/23/2022       Reactions   Aspirin Anaphylaxis   Bee Venom Anaphylaxis   Ceclor [cefaclor] Anaphylaxis   Simvastatin Other (See Comments)   Hair loss   Pneumococcal Vac Polyvalent    Other reaction(s): swelling and erythema   Statins Other (See Comments)   Elevated liver enzymes   Crestor [rosuvastatin] Rash   Sulfa Antibiotics Rash        Medication List     TAKE these medications  albuterol 108 (90 Base) MCG/ACT inhaler Commonly known as: VENTOLIN HFA INHALE 2 PUFFS INTO THE LUNGS EVERY 6 (SIX) HOURS AS NEEDED FOR SHORTNESS OF BREATH (USE AS NEEDED OR BEFORE ACTIVITY FOR SHORTNESS OF BREATH OR WHEZZING)   atorvastatin 20 MG tablet Commonly known as: LIPITOR Take 1 tablet (20 mg total) by mouth daily. Start taking on: September 24, 2022 What changed:  medication  strength how much to take   Biotin 5 MG Caps Take 500 mg by mouth daily.   clopidogrel 75 MG tablet Commonly known as: PLAVIX Take 1 tablet (75 mg total) by mouth daily.   diphenhydrAMINE 25 mg capsule Commonly known as: BENADRYL Take 25 mg by mouth every 6 (six) hours as needed.   fluticasone 50 MCG/ACT nasal spray Commonly known as: FLONASE Place into both nostrils daily.   furosemide 20 MG tablet Commonly known as: LASIX Take 1 tablet (20 mg total) by mouth as needed. What changed: when to take this   levothyroxine 50 MCG tablet Commonly known as: SYNTHROID Take 50 mcg by mouth as needed. In am   meclizine 25 MG tablet Commonly known as: ANTIVERT Take 25 mg by mouth 3 (three) times daily as needed for dizziness.   montelukast 10 MG tablet Commonly known as: SINGULAIR TAKE 1 TABLET BY MOUTH EVERY DAY What changed: when to take this   multivitamin with minerals Tabs tablet Take 1 tablet by mouth every other day.   Peak Flow Meter Devi 1 puff by Does not apply route in the morning and at bedtime.   potassium chloride 10 MEQ tablet Commonly known as: KLOR-CON Take 1 tablet (10 mEq total) by mouth daily. What changed: additional instructions   propranolol 20 MG tablet Commonly known as: INDERAL Take 1 tablet (20 mg total) by mouth 3 (three) times daily.   SALINE NASAL SPRAY NA Place 1 spray into the nose daily.   Stiolto Respimat 2.5-2.5 MCG/ACT Aers Generic drug: Tiotropium Bromide-Olodaterol INHALE 2 PUFFS BY MOUTH INTO THE LUNGS DAILY   Systane 0.4-0.3 % Soln Generic drug: Polyethyl Glycol-Propyl Glycol Place 1 drop into both eyes daily as needed (dry/irritated eyes.).        Day of Discharge BP 118/74 (BP Location: Right Arm)   Pulse 81   Temp 97.6 F (36.4 C) (Oral)   Resp 16   Ht 4\' 11"  (1.499 m)   Wt 50.4 kg   SpO2 100%   BMI 22.44 kg/m   Physical Exam: General: No acute respiratory distress Lungs: Clear to auscultation bilaterally  without wheezes or crackles Cardiovascular: Regular rate and rhythm without murmur gallop or rub normal S1 and S2 Abdomen: Nontender, nondistended, soft, bowel sounds positive, no rebound, no ascites, no appreciable mass Extremities: No significant cyanosis, clubbing, or edema bilateral lower extremities  Basic Metabolic Panel: Recent Labs  Lab 09/22/22 1547 09/22/22 1607 09/23/22 0224  NA 133* 135 133*  K 3.7 3.8 3.7  CL 98 100 98  CO2 21*  --  27  GLUCOSE 92 92 92  BUN 19 22 16   CREATININE 1.21* 1.30* 1.21*  CALCIUM 8.6*  --  8.5*    CBC: Recent Labs  Lab 09/22/22 1547 09/22/22 1607 09/23/22 0224  WBC 8.7  --  7.3  NEUTROABS 5.5  --   --   HGB 11.6* 12.2 11.3*  HCT 36.0 36.0 34.6*  MCV 96.3  --  96.1  PLT 209  --  176    Time spent in discharge (includes decision making &  examination of pt): 30 minutes  09/23/2022, 3:12 PM   Lonia Blood, MD [facility] Office  [phone]

## 2022-09-23 NOTE — Plan of Care (Signed)
  Problem: Ischemic Stroke/TIA Tissue Perfusion: Goal: Complications of ischemic stroke/TIA will be minimized Outcome: Progressing   Problem: Coping: Goal: Will verbalize positive feelings about self Outcome: Progressing   Problem: Activity: Goal: Risk for activity intolerance will decrease Outcome: Progressing   Problem: Nutrition: Goal: Adequate nutrition will be maintained Outcome: Progressing   Problem: Skin Integrity: Goal: Risk for impaired skin integrity will decrease Outcome: Progressing

## 2022-09-23 NOTE — Progress Notes (Signed)
  Echocardiogram 2D Echocardiogram has been performed.  [name] [name] 09/23/2022, 11:10 AM

## 2022-09-23 NOTE — Consult Note (Incomplete)
Neurology Consultation Reason for Consult: Central retinal artery occlusion Referring Physician: Terrilee Croak  CC: Vision change  History is obtained from: Patient  HPI: [name] [name] is a 89 y.o. female who was in her normal state of health until she had abrupt change in her vision yesterday afternoon.  She states that she noticed the change rather abruptly, and it has been gradually improving since that time.  Due to her deficits she saw an ophthalmologist today, Dr. Charlotte Sanes, who diagnosed her with retinal artery occlusion and referred her to the emergency department.  In the chart, it states central retinal artery occlusion, but given the partial nature, I wonder if this was a branch retinal artery occlusion.  In any case, she was referred to the emergency department for consideration of stroke workup.  She denies headache, neck pain, jaw claudication.  LKW: 8/31, early afternoon tnk given?: no, outside of window  Past Medical History:  Diagnosis Date  . Arthritis   . Asthma    history of  . CAD (coronary artery disease)   . Cirrhosis (HCC)   . CKD (chronic kidney disease)   . Dyspnea    sometimes sitting.lying and on exertion  . Emphysema lung (HCC)   . GERD (gastroesophageal reflux disease)   . History of hiatal hernia   . History of transcatheter aortic valve replacement (TAVR) 01/26/2019   (TAVR  23 mm Evolute Pro in aortic position 01/26/2019) .   Marland Kitchen Hyperlipidemia   . Hypertension   . Hypothyroidism   . Lipoma    left arm  . Nasal polyps   . Pneumonia   . Pulmonary nodule    noted on pre TAVR CT  . Samter's triad   . Severe aortic stenosis      Family History  Problem Relation Age of Onset  . Tuberculosis Mother   . Other Father        Bronchiectasis  . Sarcoidosis Sister   . Dementia Sister   . Heart disease Brother   . Heart attack Brother   . Cancer Maternal Grandmother        ovarian  . Asthma Maternal Grandfather   . Breast cancer Daughter   . Brain  cancer Daughter      Social History:  reports that she has never smoked. She has never used smokeless tobacco. She reports that she does not drink alcohol and does not use drugs.   Exam: Current vital signs: BP (!) 140/73 (BP Location: Right Arm)   Pulse 83   Temp 98 F (36.7 C) (Oral)   Resp 18   Ht 4\' 11"  (1.499 m)   Wt 52.2 kg   SpO2 99%   BMI 23.23 kg/m  Vital signs in last 24 hours: Temp:  [98 F (36.7 C)-98.3 F (36.8 C)] 98 F (36.7 C) (09/01 2113) Pulse Rate:  [76-87] 83 (09/01 2113) Resp:  [16-18] 18 (09/01 2113) BP: (120-145)/(69-76) 140/73 (09/01 2113) SpO2:  [97 %-100 %] 99 % (09/01 2113) Weight:  [52.2 kg] 52.2 kg (09/01 1546)   Physical Exam  Appears well-developed and well-nourished.   Neuro: Mental Status: Patient is awake, alert, oriented to person, place, month, year, and situation. Patient is able to give a clear and coherent history. No signs of aphasia or neglect Cranial Nerves: II: Visual Fields are full. Pupils are large, and though there is some reaction, I suspect there is a lingering effect of her dilation earlier today. III,IV, VI: EOMI without ptosis or diploplia.  V: Facial sensation is symmetric to temperature VII: Facial movement is symmetric.  VIII: hearing is intact to voice X: Uvula elevates symmetrically XI: Shoulder shrug is symmetric. XII: tongue is midline without atrophy or fasciculations.  Motor: Tone is normal. Bulk is normal. 5/5 strength was present in all four extremities.  Sensory: Sensation is symmetric to light touch and temperature in the arms and legs. Deep Tendon Reflexes: 2+ and symmetric in the biceps and patellae.  Plantars: Toes are downgoing bilaterally.  Cerebellar: FNF and HKS are intact bilaterally    I have reviewed labs in epic and the results pertinent to this consultation are: Cr 1.21  I have reviewed the images obtained:CTA - negative  MRI - negative  Impression: 89 year old female with  retinal artery occlusion referred to the emergency department by ophthalmology for stroke workup.  She will need further evaluation for an embolic source  Recommendations: 1) ***   Ritta Slot, MD [facility] [phone]  If 7pm- 7am, please page neurology on call as listed in [facility].

## 2022-10-28 ENCOUNTER — Telehealth: Payer: Self-pay | Admitting: Cardiology

## 2022-10-28 NOTE — Telephone Encounter (Signed)
No. Thank you.

## 2022-10-28 NOTE — Telephone Encounter (Signed)
New Message:     Patient is scheduled to have an Echo in October per Dr Jacinto Halim. I see where patient just had an Echo on 09-23-22 by another [facility] provider. Does Dr Jacinto Halim still want her to do this one in October?

## 2022-10-29 ENCOUNTER — Encounter (HOSPITAL_BASED_OUTPATIENT_CLINIC_OR_DEPARTMENT_OTHER): Payer: Self-pay | Admitting: Pulmonary Disease

## 2022-10-29 ENCOUNTER — Ambulatory Visit (HOSPITAL_BASED_OUTPATIENT_CLINIC_OR_DEPARTMENT_OTHER): Payer: Federal, State, Local not specified - PPO | Admitting: Pulmonary Disease

## 2022-10-29 VITALS — BP 108/68 | HR 63 | Ht 59.0 in | Wt 111.0 lb

## 2022-10-29 DIAGNOSIS — R29898 Other symptoms and signs involving the musculoskeletal system: Secondary | ICD-10-CM | POA: Diagnosis not present

## 2022-10-29 DIAGNOSIS — J432 Centrilobular emphysema: Secondary | ICD-10-CM | POA: Diagnosis not present

## 2022-10-29 DIAGNOSIS — J4530 Mild persistent asthma, uncomplicated: Secondary | ICD-10-CM

## 2022-10-29 MED ORDER — STIOLTO RESPIMAT 2.5-2.5 MCG/ACT IN AERS: 2.0000 | INHALATION_SPRAY | Freq: Every day | RESPIRATORY_TRACT | 1 refills | Status: DC

## 2022-10-29 MED ORDER — MONTELUKAST SODIUM 10 MG PO TABS: 10.0000 mg | ORAL_TABLET | Freq: Every day | ORAL | 3 refills | Status: DC

## 2022-10-29 NOTE — Progress Notes (Signed)
Subjective:   PATIENT ID: [name] [name] GENDER: female DOB: [birth year], MRN: [id number]   HPI  Chief Complaint  Patient presents with   Emphysema   Shortness of Breath   Reason for Visit: Follow-up  [name] is a 89 year old female never smoker with emphysema, CAD s/p stent, AVS s/pt TAVR 01/2019, pulmonary nodules who presents for follow-up.  Synopsis:  She was referred to Pulmonary after being discharged from the hospital in 04/2020. Per review of discharge note she required 1-2L O2 in the ED. V/Q scan was negative for PE. While inpatient she was diuresed. Cardiology was consulted and plan for outpatient nuclear stress test. She was also referred to Pulmonary to consider PFT for evaluation of her emphysema. At time of discharge she did not require any oxygen. She has been on lasix and has noticed weight gain of 10lbs post-discharge. This morning is 116lb, baseline is 113-115lb.  She reports childhood asthma and respiratory complications during pregnancy in her 20-30s. She was on a nebulizer 2-3 x daily in her 50-60s and needing steroids at least twice a year. However in her 70s-80s she has not had any issues or needing any medications for her asthma and this may have been related from removing environmental issues (older home, dust, tobacco farms, gardening)  She has had multiple sinus surgery x 4 including for nasal polyp removal. She was seen by ENT yesterday. She takes zyrtec and saline rinses. She is scheduled for stress test next week with [facility].  05/18/20 Since our last visit, she has been tolerating Spiriva and feels that it is improving her shortness of breath. Her daughter is present with her daughter and wish to review PFT results.  10/02/20 Since our last visit she was seen by ENT and underwent sinus surgery (sinus fusion and intranasal polypectomy) in Aug 2022 with path returning with pseudomonas and fungus. She was treated with a course of amoxicillin.  Last ENT visit was 09/29/20 and started on nasal rinses and sprays. Not sure if this is effective yet since she recently started. She is on diuretics which she manages diligently with daily weight checks. She has been referred to a Nephrology. She tolerating the Spiriva and feels it is helping her. Uses albuterol on average once a week for shortness of breath.    12/28/20 Since our last visit she reports she is overall well controlled. Her shortness of breath is stable. She is able to perform activities around the house but she does not go upstairs. Improved intermittent cough. No wheezing. Uses albuterol once a week.  08/17/21 She reports worsening shortness of breath and has been evaluated by Cardiology. Stress test negative. She reports worsening ability to take a deep breath. Has been taking her albuterol daily more frequently. She is active at baseline. Occasional cough since her COVID infection at Christmas. Compliant with Spiriva. Daughter on the phone reports she isn't compliant with her lasix   10/22/21 Since our last visit she reports that she has some good days and some bad days for shortness of breath. Compliant with Stiolto which is improving cough. She is taking lasix as needed and not currently having any increased weight or leg swelling. She is using albuterol inhaler once a week but thinks she should be using it more. Not very active at baseline. Has to care for her husband and feels her plate is full.  04/08/22 Since our last visit she is compliant with her Stiolto with improved cough.  Will occur while laying down which she attributes to her hiatal hernia. She uses albuterol three times a month. Denies wheezing.On days she does have shortness of breath, lasix will resolve this. Her appetite is not as robust and drinking boost 3-4 days a week. Eats mainly fruits and vegetables. Avoids beef.   10/29/22 Since our last visit her husband has had some health issues and starting long term care  assistance. She also had a central retinal occlusion that has affected her vision. She has been fatigued lately. She has been feeling short of breath with activity. Her husband has been participating in physical therapy and she has noticed that she is unable to keep up with him. Occasional cough. No wheezing. She is compliant with Stiolto. Uses albuterol 1-2 times a week.  Social History: Never smoker Her mother was diagnosed with tuberculosis Wood burning stove  Past Medical History:  Diagnosis Date   Arthritis    Asthma    history of   CAD (coronary artery disease)    Cirrhosis (HCC)    CKD (chronic kidney disease)    Dyspnea    sometimes sitting.lying and on exertion   Emphysema lung (HCC)    GERD (gastroesophageal reflux disease)    History of hiatal hernia    History of transcatheter aortic valve replacement (TAVR) 01/26/2019   (TAVR  23 mm Evolute Pro in aortic position 01/26/2019) .    Hyperlipidemia    Hypertension    Hypothyroidism    Lipoma    left arm   Nasal polyps    Pneumonia    Pulmonary nodule    noted on pre TAVR CT   Samter's triad    Severe aortic stenosis      Allergies  Allergen Reactions   Aspirin Anaphylaxis   Bee Venom Anaphylaxis   Ceclor [Cefaclor] Anaphylaxis   Simvastatin Other (See Comments)    Hair loss   Pneumococcal Vac Polyvalent     Other reaction(s): swelling and erythema   Statins Other (See Comments)    Elevated liver enzymes   Crestor [Rosuvastatin] Rash   Sulfa Antibiotics Rash     Outpatient Medications Prior to Visit  Medication Sig Dispense Refill   albuterol (VENTOLIN HFA) 108 (90 Base) MCG/ACT inhaler INHALE 2 PUFFS INTO THE LUNGS EVERY 6 (SIX) HOURS AS NEEDED FOR SHORTNESS OF BREATH (USE AS NEEDED OR BEFORE ACTIVITY FOR SHORTNESS OF BREATH OR WHEZZING) 8.5 each 6   atorvastatin (LIPITOR) 20 MG tablet Take 1 tablet (20 mg total) by mouth daily. 30 tablet 2   Biotin 5 MG CAPS Take 500 mg by mouth daily.     clopidogrel  (PLAVIX) 75 MG tablet Take 1 tablet (75 mg total) by mouth daily. 90 tablet 3   fexofenadine (ALLEGRA) 180 MG tablet Take 180 mg by mouth as needed for allergies or rhinitis. Usually 3 times a week per patient     furosemide (LASIX) 20 MG tablet Take 1 tablet (20 mg total) by mouth as needed. (Patient taking differently: Take 20 mg by mouth every Monday, Wednesday, and Friday.) 90 tablet 3   levothyroxine (SYNTHROID) 50 MCG tablet Take 50 mcg by mouth as needed. In am     meclizine (ANTIVERT) 25 MG tablet Take 25 mg by mouth 3 (three) times daily as needed for dizziness.     mometasone (NASONEX) 50 MCG/ACT nasal spray Place 2 sprays into the nose daily.     Multiple Vitamin (MULTIVITAMIN WITH MINERALS) TABS tablet Take 1 tablet  by mouth every other day.     Peak Flow Meter DEVI 1 puff by Does not apply route in the morning and at bedtime. 1 each 0   Polyethyl Glycol-Propyl Glycol (SYSTANE) 0.4-0.3 % SOLN Place 1 drop into both eyes daily as needed (dry/irritated eyes.).     potassium chloride (KLOR-CON) 10 MEQ tablet Take 1 tablet (10 mEq total) by mouth daily. (Patient taking differently: Take 10 mEq by mouth daily. Only takes with lasix) 90 tablet 3   propranolol (INDERAL) 20 MG tablet Take 1 tablet (20 mg total) by mouth 3 (three) times daily. 270 tablet 1   SALINE NASAL SPRAY NA Place 1 spray into the nose daily.     montelukast (SINGULAIR) 10 MG tablet TAKE 1 TABLET BY MOUTH EVERY DAY (Patient taking differently: Take 10 mg by mouth at bedtime.) 90 tablet 3   Tiotropium Bromide-Olodaterol (STIOLTO RESPIMAT) 2.5-2.5 MCG/ACT AERS INHALE 2 PUFFS BY MOUTH INTO THE LUNGS DAILY 4 g 5   diphenhydrAMINE (BENADRYL) 25 mg capsule Take 25 mg by mouth every 6 (six) hours as needed. (Patient not taking: Reported on 10/29/2022)     fluticasone (FLONASE) 50 MCG/ACT nasal spray Place into both nostrils daily.     No facility-administered medications prior to visit.    Review of Systems  Constitutional:   Positive for malaise/fatigue. Negative for chills, diaphoresis, fever and weight loss.  HENT:  Negative for congestion.   Respiratory:  Positive for shortness of breath. Negative for cough, hemoptysis, sputum production and wheezing.   Cardiovascular:  Negative for chest pain, palpitations and leg swelling.     Objective:   Vitals:   10/29/22 1257  BP: 108/68  Pulse: 63  SpO2: 98%  Weight: 111 lb (50.3 kg)  Height: 4\' 11"  (1.499 m)   SpO2: 98 %  Physical Exam: General: Frail and elderly-appearing, no acute distress HENT: [place], AT Eyes: EOMI, no scleral icterus Respiratory: Clear to auscultation bilaterally.  No crackles, wheezing or rales Cardiovascular: RRR, -M/R/G, no JVD Extremities:-Edema,-tenderness Neuro: AAO x4, CNII-XII grossly intact Psych: Normal mood, normal affect  Data Reviewed:  Imaging: CT Chest 12/23/19 - S/p SVR. Tortuous thoracic aorta. Mild centrilobular emphysema. Large hiatal hernia. Stable pulmonary nodules including RML 7x5 mm and LLL ~4.5 mm, unchanged from prior imaging  CT Chest 12/25/20 - Unchanged 7 mm nodule, stable subcentimeter nodules. Large hiatal hernia.   CXR 10/22/21 - No acute infiltrate, effusion or edema. Large hiatal hernia  PFT: 05/05/20 FVC 1.7 (91%) FEV1 1.26 (93%) Ratio 73  TLC 84% DLCO 43% Interpretation:  Normal spirometry. No obstructive or restrictive defect  Labs: WBC 05/04/20 9.9 Absolute eos 05/04/20 200  Assessment & Plan:   Discussion: 89 year old female with asthma, emphysema on CT who presents for follow-up. No exacerbations on Stiolto. Worsening dyspnea does not correlate with worsening respiratory symptoms. Suspect deconditioning. Discussed clinical course and management of emphysema/asthma including bronchodilator regimen, preventive care including vaccinations and action plan for exacerbation.  Prior Inhalers Spiriva - good except for cough Stiolto - improved cough control  Asthma/Emphysema - worsening  shortness of breath. Suspect deconditioning is contributing --CONTINUE Stiolto 2.5/2.5 mcg TWO puffs ONCE a day. REFILL --CONTINUE Albuterol as needed for shortness of breath or wheezing --CONTINUE Singulair 10 mg daily. REFILL --Refer to home physical therapy. Unable to participate in pulmonary rehab due to limited activity  History of TB exposure --QF-TB neg  RML lung nodule Stable subcentimeter nodules --Stable 7 mm nodule since 2020 --No further follow-up  needed. Patient not interested in aggressive management  Health Maintenance Immunization History  Administered Date(s) Administered   Fluad Quad(high Dose 65+) 11/05/2021   Influenza Split 10/19/2012, 10/07/2016, 10/25/2019   Influenza Whole 10/27/2015   Influenza, High Dose Seasonal PF 11/08/2009, 10/18/2011, 10/10/2014, 10/27/2015, 11/05/2017, 10/08/2022   Influenza-Unspecified 11/08/2009, 10/18/2011, 10/10/2014, 10/27/2015, 11/05/2017   Moderna Sars-Covid-2 Vaccination 02/25/2019   PFIZER Comirnaty(Gray Top)Covid-19 Tri-Sucrose Vaccine 10/12/2021   PFIZER(Purple Top)SARS-COV-2 Vaccination 02/25/2019, 03/24/2019, 11/08/2019, 06/16/2020   Pfizer Covid-19 Vaccine Bivalent Booster 42yrs & up 10/05/2020, 10/09/2021, 09/17/2022   Pneumococcal Conjugate-13 09/21/2014, 11/24/2015   Pneumococcal Polysaccharide-23 04/29/1998, 01/21/2006, 06/05/2006, 05/10/2013   Td 06/22/2003   Td (Adult) 06/22/2003   Tdap 09/09/2013, 10/27/2015   Zoster, Live 06/05/2006, 07/22/2017, 10/08/2017   CT Lung Screen - not indicated  Orders Placed This Encounter  Procedures   Ambulatory referral to Home Health    Referral Priority:   Routine    Referral Type:   Home Health Care    Referral Reason:   Specialty Services Required    Requested Specialty:   Home Health Services    Number of Visits Requested:   1   Meds ordered this encounter  Medications   Tiotropium Bromide-Olodaterol (STIOLTO RESPIMAT) 2.5-2.5 MCG/ACT AERS    Sig: Inhale 2 puffs  into the lungs daily.    Dispense:  12 g    Refill:  1    Dispense 3 inhalers   montelukast (SINGULAIR) 10 MG tablet    Sig: Take 1 tablet (10 mg total) by mouth at bedtime.    Dispense:  90 tablet    Refill:  3   Return in about 6 months (around 04/29/2023).  I have spent a total time of 30-minutes on the day of the appointment including chart review, data review, collecting history, coordinating care and discussing medical diagnosis and plan with the patient/family. Past medical history, allergies, medications were reviewed. Pertinent imaging, labs and tests included in this note have been reviewed and interpreted independently by me.  Shineka Auble Mechele Collin, MD [facility] 10/29/2022  Office Number [phone]

## 2022-10-29 NOTE — Patient Instructions (Signed)
Asthma/Emphysema - worsening shortness of breath. Suspect deconditioning is contributing Deconditioning --CONTINUE Stiolto 2.5/2.5 mcg TWO puffs ONCE a day. REFILL --CONTINUE Albuterol as needed for shortness of breath or wheezing --CONTINUE Singulair 10 mg daily. REFILL --Refer to home physical therapy. Unable to participate in pulmonary rehab due to limited activity

## 2022-10-30 ENCOUNTER — Other Ambulatory Visit: Payer: Federal, State, Local not specified - PPO

## 2022-11-08 ENCOUNTER — Other Ambulatory Visit: Payer: Self-pay | Admitting: Gastroenterology

## 2022-11-08 DIAGNOSIS — R748 Abnormal levels of other serum enzymes: Secondary | ICD-10-CM

## 2022-11-08 DIAGNOSIS — R7989 Other specified abnormal findings of blood chemistry: Secondary | ICD-10-CM

## 2022-11-11 ENCOUNTER — Ambulatory Visit: Payer: Self-pay | Admitting: Cardiology

## 2022-11-15 ENCOUNTER — Other Ambulatory Visit: Payer: Federal, State, Local not specified - PPO

## 2022-11-15 ENCOUNTER — Ambulatory Visit
Admission: RE | Admit: 2022-11-15 | Discharge: 2022-11-15 | Disposition: A | Discharge status: Home / Self Care | Payer: Federal, State, Local not specified - PPO | Source: Ambulatory Visit | Attending: Gastroenterology | Admitting: Gastroenterology

## 2022-11-15 DIAGNOSIS — R7989 Other specified abnormal findings of blood chemistry: Secondary | ICD-10-CM

## 2022-11-15 DIAGNOSIS — R748 Abnormal levels of other serum enzymes: Secondary | ICD-10-CM

## 2022-11-15 MED ORDER — IOPAMIDOL (ISOVUE-300) INJECTION 61%
80.0000 mL | Freq: Once | INTRAVENOUS | Status: AC | PRN
  Administered 2022-11-15: 80 mL via INTRAVENOUS

## 2022-11-23 ENCOUNTER — Other Ambulatory Visit: Payer: Self-pay | Admitting: Cardiology

## 2022-11-23 DIAGNOSIS — I2510 Atherosclerotic heart disease of native coronary artery without angina pectoris: Secondary | ICD-10-CM

## 2022-11-26 ENCOUNTER — Emergency Department (HOSPITAL_BASED_OUTPATIENT_CLINIC_OR_DEPARTMENT_OTHER)
Admission: EM | Admit: 2022-11-26 | Discharge: 2022-11-26 | Disposition: A | Discharge status: Home / Self Care | Payer: Federal, State, Local not specified - PPO | Attending: Emergency Medicine | Admitting: Emergency Medicine

## 2022-11-26 ENCOUNTER — Emergency Department (HOSPITAL_BASED_OUTPATIENT_CLINIC_OR_DEPARTMENT_OTHER): Payer: Federal, State, Local not specified - PPO

## 2022-11-26 ENCOUNTER — Encounter (HOSPITAL_BASED_OUTPATIENT_CLINIC_OR_DEPARTMENT_OTHER): Payer: Self-pay | Admitting: Emergency Medicine

## 2022-11-26 DIAGNOSIS — N189 Chronic kidney disease, unspecified: Secondary | ICD-10-CM | POA: Diagnosis not present

## 2022-11-26 DIAGNOSIS — R1031 Right lower quadrant pain: Secondary | ICD-10-CM

## 2022-11-26 DIAGNOSIS — K4090 Unilateral inguinal hernia, without obstruction or gangrene, not specified as recurrent: Secondary | ICD-10-CM | POA: Insufficient documentation

## 2022-11-26 DIAGNOSIS — I2510 Atherosclerotic heart disease of native coronary artery without angina pectoris: Secondary | ICD-10-CM | POA: Insufficient documentation

## 2022-11-26 DIAGNOSIS — Z7902 Long term (current) use of antithrombotics/antiplatelets: Secondary | ICD-10-CM | POA: Insufficient documentation

## 2022-11-26 DIAGNOSIS — Z79899 Other long term (current) drug therapy: Secondary | ICD-10-CM | POA: Diagnosis not present

## 2022-11-26 DIAGNOSIS — R7401 Elevation of levels of liver transaminase levels: Secondary | ICD-10-CM | POA: Diagnosis not present

## 2022-11-26 LAB — COMPREHENSIVE METABOLIC PANEL
ALT: 65 U/L — ABNORMAL HIGH (ref 0–44)
AST: 119 U/L — ABNORMAL HIGH (ref 15–41)
Albumin: 3.8 g/dL (ref 3.5–5.0)
Alkaline Phosphatase: 410 U/L — ABNORMAL HIGH (ref 38–126)
Anion gap: 7 (ref 5–15)
BUN: 17 mg/dL (ref 8–23)
CO2: 27 mmol/L (ref 22–32)
Calcium: 9.3 mg/dL (ref 8.9–10.3)
Chloride: 97 mmol/L — ABNORMAL LOW (ref 98–111)
Creatinine, Ser: 1.39 mg/dL — ABNORMAL HIGH (ref 0.44–1.00)
GFR, Estimated: 36 mL/min — ABNORMAL LOW (ref >60)
Glucose, Bld: 103 mg/dL — ABNORMAL HIGH (ref 70–99)
Potassium: 4.5 mmol/L (ref 3.5–5.1)
Sodium: 131 mmol/L — ABNORMAL LOW (ref 135–145)
Total Bilirubin: 1 mg/dL (ref <1.2)
Total Protein: 7.1 g/dL (ref 6.5–8.1)

## 2022-11-26 LAB — URINALYSIS, ROUTINE W REFLEX MICROSCOPIC
Bacteria, UA: NONE SEEN
Bilirubin Urine: NEGATIVE
Glucose, UA: NEGATIVE mg/dL
Hgb urine dipstick: NEGATIVE
Ketones, ur: NEGATIVE mg/dL
Nitrite: NEGATIVE
Protein, ur: NEGATIVE mg/dL
Specific Gravity, Urine: 1.014 (ref 1.005–1.030)
pH: 7 (ref 5.0–8.0)

## 2022-11-26 LAB — CBC
HCT: 36.2 % (ref 36.0–46.0)
Hemoglobin: 12 g/dL (ref 12.0–15.0)
MCH: 32.2 pg (ref 26.0–34.0)
MCHC: 33.1 g/dL (ref 30.0–36.0)
MCV: 97.1 fL (ref 80.0–100.0)
Platelets: 176 10*3/uL (ref 150–400)
RBC: 3.73 MIL/uL — ABNORMAL LOW (ref 3.87–5.11)
RDW: 14.5 % (ref 11.5–15.5)
WBC: 8 10*3/uL (ref 4.0–10.5)
nRBC: 0 % (ref 0.0–0.2)

## 2022-11-26 LAB — LIPASE, BLOOD: Lipase: 64 U/L — ABNORMAL HIGH (ref 11–51)

## 2022-11-26 MED ORDER — FENTANYL CITRATE PF 50 MCG/ML IJ SOSY: 25.0000 ug | PREFILLED_SYRINGE | Freq: Once | INTRAMUSCULAR | Status: DC

## 2022-11-26 MED ORDER — IOHEXOL 300 MG/ML  SOLN
100.0000 mL | Freq: Once | INTRAMUSCULAR | Status: AC | PRN
  Administered 2022-11-26: 65 mL via INTRAVENOUS

## 2022-11-26 MED ORDER — MORPHINE SULFATE (PF) 2 MG/ML IV SOLN
2.0000 mg | Freq: Once | INTRAVENOUS | Status: AC
  Administered 2022-11-26: 2 mg via INTRAVENOUS
  Filled 2022-11-26: qty 1

## 2022-11-26 NOTE — Discharge Instructions (Signed)
You were seen for your hernia in the emergency department.   At home, please Tylenol for any discomfort that you have.    Check your MyChart online for the results of any tests that had not resulted by the time you left the emergency department.   Follow-up with your the general surgeons in 2-3 days regarding your visit to see if you require an operation.    Return immediately to the emergency department if you experience any of the following: Worsening pain, vomiting, or any other concerning symptoms.    Thank you for visiting our Emergency Department. It was a pleasure taking care of you today.

## 2022-11-26 NOTE — ED Triage Notes (Signed)
Abdo pain.  Noticed "lump" in RLQ. Tender and painful. Noticed today at 5pm Denies n/v. Reports last BM today

## 2022-11-26 NOTE — ED Notes (Signed)
Discharge instructions reviewed with patient. Patient questions answered and opportunity for education reviewed. Patient voices understanding of discharge instructions with no further questions. Patient ambulatory with steady gait to lobby.  

## 2022-11-26 NOTE — ED Notes (Signed)
Patient to restroom to attempt urine sample.

## 2022-11-26 NOTE — ED Provider Notes (Signed)
[place] EMERGENCY DEPARTMENT AT [facility] Provider Note   CSN: [id number] Arrival date & time: 11/26/22  1901     History {Add pertinent medical, surgical, social history, OB history to HPI:1} Chief Complaint  Patient presents with   Abdominal Pain    [name] [name] is a 89 y.o. female.  Umbilical hernia repair Hysterectomy with bladder tack Difficulty peeing On lasix for chf CT 11/15/22  Started having pains in her lower abdomen at 530 pm,    Abdo pain.  Noticed "lump" in RLQ. Tender and painful. Noticed today at 5pm Denies n/v. Reports last BM today  Past Medical History: No date: Arthritis No date: Asthma     Comment:  history of No date: CAD (coronary artery disease) No date: Cirrhosis (HCC) No date: CKD (chronic kidney disease) No date: Dyspnea     Comment:  sometimes sitting.lying and on exertion No date: Emphysema lung (HCC) No date: GERD (gastroesophageal reflux disease) No date: History of hiatal hernia 01/26/2019: History of transcatheter aortic valve replacement (TAVR)     Comment:  (TAVR  23 mm Evolute Pro in aortic position 01/26/2019)               .  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 Authorizing Provider  albuterol (VENTOLIN HFA) 108 (90 Base) MCG/ACT inhaler INHALE 2 PUFFS INTO THE LUNGS EVERY 6 (SIX) HOURS AS NEEDED FOR SHORTNESS OF BREATH (USE AS NEEDED OR BEFORE ACTIVITY FOR SHORTNESS OF BREATH OR WHEZZING) 10/01/21   Luciano Cutter, MD  atorvastatin (LIPITOR) 20 MG tablet Take 1 tablet (20 mg total) by mouth daily. 09/24/22   Lonia Blood, MD  Biotin 5 MG CAPS Take 500 mg by mouth  daily.    [provider]  clopidogrel (PLAVIX) 75 MG tablet TAKE 1 TABLET BY MOUTH EVERY DAY 11/25/22   Yates Decamp, MD  diphenhydrAMINE (BENADRYL) 25 mg capsule Take 25 mg by mouth every 6 (six) hours as needed. Patient not taking: Reported on 10/29/2022    [provider]  fexofenadine (ALLEGRA) 180 MG tablet Take 180 mg by mouth as needed for allergies or rhinitis. Usually 3 times a week per patient    [provider]  furosemide (LASIX) 20 MG tablet Take 1 tablet (20 mg total) by mouth as needed. Patient taking differently: Take 20 mg by mouth every Monday, Wednesday, and Friday. 07/05/22   Yates Decamp, MD  levothyroxine (SYNTHROID) 50 MCG tablet Take 50 mcg by mouth as needed. In am    [provider]  meclizine (ANTIVERT) 25 MG tablet Take 25 mg by mouth 3 (three) times daily as needed for dizziness.    [provider]  mometasone (NASONEX) 50 MCG/ACT nasal spray Place 2 sprays into the nose daily.    [provider]  montelukast (SINGULAIR) 10 MG tablet Take 1 tablet (10 mg total) by mouth at bedtime. 10/29/22   Luciano Cutter, MD  Multiple Vitamin (MULTIVITAMIN WITH MINERALS) TABS tablet Take 1 tablet by mouth every other day.    [provider]  Peak Flow Meter DEVI 1  puff by Does not apply route in the morning and at bedtime. 05/12/20   Luciano Cutter, MD  Polyethyl Glycol-Propyl Glycol (SYSTANE) 0.4-0.3 % SOLN Place 1 drop into both eyes daily as needed (dry/irritated eyes.).    [provider]  potassium chloride (KLOR-CON) 10 MEQ tablet Take 1 tablet (10 mEq total) by mouth daily. Patient taking differently: Take 10 mEq by mouth daily. Only takes with lasix 05/12/20   Cantwell, Celeste C, PA-C  propranolol (INDERAL) 20 MG tablet Take 1 tablet (20 mg total) by mouth 3 (three) times daily. 04/11/22   Yates Decamp, MD  SALINE NASAL SPRAY NA Place 1 spray into the nose daily.    [provider]  Tiotropium  Bromide-Olodaterol (STIOLTO RESPIMAT) 2.5-2.5 MCG/ACT AERS Inhale 2 puffs into the lungs daily. 10/29/22   Luciano Cutter, MD      Allergies    Aspirin, Bee venom, Ceclor [cefaclor], Simvastatin, Pneumococcal vac polyvalent, Statins, Crestor [rosuvastatin], and Sulfa antibiotics    Review of Systems   Review of Systems  Physical Exam Updated Vital Signs BP (!) 149/73   Pulse 72   Temp 97.9 F (36.6 C)   Resp 13   SpO2 100%  Physical Exam  ED Results / Procedures / Treatments   Labs (all labs ordered are listed, but only abnormal results are displayed) Labs Reviewed  LIPASE, BLOOD - Abnormal; Notable for the following components:      Result Value   Lipase 64 (*)    All other components within normal limits  COMPREHENSIVE METABOLIC PANEL - Abnormal; Notable for the following components:   Sodium 131 (*)    Chloride 97 (*)    Glucose, Bld 103 (*)    Creatinine, Ser 1.39 (*)    AST 119 (*)    ALT 65 (*)    Alkaline Phosphatase 410 (*)    GFR, Estimated 36 (*)    All other components within normal limits  CBC - Abnormal; Notable for the following components:   RBC 3.73 (*)    All other components within normal limits  URINALYSIS, ROUTINE W REFLEX MICROSCOPIC - Abnormal; Notable for the following components:   Leukocytes,Ua SMALL (*)    All other components within normal limits    EKG None  Radiology No results found.  Procedures Procedures  {Document cardiac monitor, telemetry assessment procedure when appropriate:1}  Medications Ordered in ED Medications - No data to display  ED Course/ Medical Decision Making/ A&P   {   Click here for ABCD2, HEART and other calculatorsREFRESH Note before signing :1}                              Medical Decision Making Amount and/or Complexity of Data Reviewed Labs: ordered.   ***  {Document critical care time when appropriate:1} {Document review of labs and clinical decision tools ie heart score, Chads2Vasc2  etc:1}  {Document your independent review of radiology images, and any outside records:1} {Document your discussion with family members, caretakers, and with consultants:1} {Document social determinants of health affecting pt's care:1} {Document your decision making why or why not admission, treatments were needed:1} Final Clinical Impression(s) / ED Diagnoses Final diagnoses:  None    Rx / DC Orders ED Discharge Orders     None

## 2022-11-28 ENCOUNTER — Encounter: Payer: Self-pay | Admitting: Neurology

## 2022-11-28 ENCOUNTER — Ambulatory Visit: Payer: Federal, State, Local not specified - PPO | Admitting: Neurology

## 2022-11-28 VITALS — BP 125/65 | HR 63 | Ht 60.0 in | Wt 112.5 lb

## 2022-11-28 DIAGNOSIS — H547 Unspecified visual loss: Secondary | ICD-10-CM

## 2022-11-28 DIAGNOSIS — H3412 Central retinal artery occlusion, left eye: Secondary | ICD-10-CM
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
## 2022-11-29 LAB — ANA W/REFLEX IF POSITIVE
Anti JO-1: <0.2 AI (ref 0.0–0.9)
Anti Nuclear Antibody (ANA): POSITIVE — AB
Centromere Ab Screen: <0.2 AI (ref 0.0–0.9)
Chromatin Ab SerPl-aCnc: 0.3 AI (ref 0.0–0.9)
ENA RNP Ab: <0.2 AI (ref 0.0–0.9)
ENA SM Ab Ser-aCnc: <0.2 AI (ref 0.0–0.9)
Scleroderma (Scl-70) (ENA) Antibody, IgG: <0.2 AI (ref 0.0–0.9)
dsDNA Ab: <1 [IU]/mL (ref 0–9)

## 2022-11-29 LAB — C-REACTIVE PROTEIN: CRP: 9 mg/L (ref 0–10)

## 2022-11-29 LAB — TSH: TSH: 3.78 u[IU]/mL (ref 0.450–4.500)

## 2022-11-29 LAB — SEDIMENTATION RATE: Sed Rate: 23 mm/h (ref 0–40)

## 2022-11-29 LAB — SJOGRENS SYNDROME-A EXTRACTABLE NUCLEAR ANTIBODY: ENA SSA (RO) Ab: 3.9 AI — ABNORMAL HIGH (ref 0.0–0.9)

## 2022-11-29 LAB — VITAMIN B12: Vitamin B-12: 929 pg/mL (ref 232–1245)

## 2022-11-29 LAB — RPR: RPR Ser Ql: NONREACTIVE

## 2022-11-29 LAB — FOLATE: Folate: 14.5 ng/mL (ref >3.0)

## 2022-11-29 LAB — HIGH SENSITIVITY CRP: CRP, High Sensitivity: 8.8 mg/L — ABNORMAL HIGH (ref 0.00–3.00)

## 2022-11-29 LAB — HGB A1C W/O EAG: Hgb A1c MFr Bld: 5.9 % — ABNORMAL HIGH (ref 4.8–5.6)

## 2022-11-29 LAB — SJOGRENS SYNDROME-B EXTRACTABLE NUCLEAR ANTIBODY: ENA SSB (LA) Ab: <0.2 AI (ref 0.0–0.9)

## 2022-11-29 LAB — HOMOCYSTEINE: Homocysteine: 16.3 umol/L (ref 0.0–21.3)

## 2022-12-04 ENCOUNTER — Telehealth: Payer: Self-pay | Admitting: Neurology

## 2022-12-04 NOTE — Telephone Encounter (Signed)
I called patient, she is overall doing well, no worsening of her left vision, pending ophthalmology evaluation soon  Laboratory evaluation on November 29, 2022 showed elevated high-sensitivity C-reactive protein, but normal ESR C-reactive protein, much improved compared to previous test in September  She denies jaw claudication, no scalp pain, no headache, no chewing difficulties,  Laboratory evaluation also showed positive ANA, SSA antibody, she had long history of dry eye, dry mouth, is using eyedrops  Advised her if she has worsening symptoms, may contact her primary care physician, I also forward the results to Dr. Jacqulyn Bath, Kasandra Knudsen, MD

## 2022-12-25 ENCOUNTER — Other Ambulatory Visit: Payer: Self-pay | Admitting: *Deleted

## 2022-12-25 MED ORDER — ATORVASTATIN CALCIUM 20 MG PO TABS: 20.0000 mg | ORAL_TABLET | Freq: Every day | ORAL | 1 refills | Status: DC

## 2023-01-30 ENCOUNTER — Emergency Department (HOSPITAL_COMMUNITY)
Admission: EM | Admit: 2023-01-30 | Discharge: 2023-01-31 | Disposition: A | Discharge status: Home / Self Care | Payer: Federal, State, Local not specified - PPO | Attending: Emergency Medicine | Admitting: Emergency Medicine

## 2023-01-30 ENCOUNTER — Encounter (HOSPITAL_COMMUNITY): Payer: Self-pay | Admitting: *Deleted

## 2023-01-30 ENCOUNTER — Other Ambulatory Visit: Payer: Self-pay

## 2023-01-30 DIAGNOSIS — K4091 Unilateral inguinal hernia, without obstruction or gangrene, recurrent: Secondary | ICD-10-CM | POA: Insufficient documentation

## 2023-01-30 DIAGNOSIS — I2510 Atherosclerotic heart disease of native coronary artery without angina pectoris: Secondary | ICD-10-CM | POA: Diagnosis not present

## 2023-01-30 DIAGNOSIS — Z7902 Long term (current) use of antithrombotics/antiplatelets: Secondary | ICD-10-CM | POA: Insufficient documentation

## 2023-01-30 DIAGNOSIS — R1031 Right lower quadrant pain: Secondary | ICD-10-CM | POA: Diagnosis present

## 2023-01-30 LAB — CBC
HCT: 37.4 % (ref 36.0–46.0)
Hemoglobin: 11.8 g/dL — ABNORMAL LOW (ref 12.0–15.0)
MCH: 31.2 pg (ref 26.0–34.0)
MCHC: 31.6 g/dL (ref 30.0–36.0)
MCV: 98.9 fL (ref 80.0–100.0)
Platelets: 189 10*3/uL (ref 150–400)
RBC: 3.78 MIL/uL — ABNORMAL LOW (ref 3.87–5.11)
RDW: 14.6 % (ref 11.5–15.5)
WBC: 8 10*3/uL (ref 4.0–10.5)
nRBC: 0 % (ref 0.0–0.2)

## 2023-01-30 LAB — COMPREHENSIVE METABOLIC PANEL
ALT: 60 U/L — ABNORMAL HIGH (ref 0–44)
AST: 106 U/L — ABNORMAL HIGH (ref 15–41)
Albumin: 3.5 g/dL (ref 3.5–5.0)
Alkaline Phosphatase: 417 U/L — ABNORMAL HIGH (ref 38–126)
Anion gap: 9 (ref 5–15)
BUN: 22 mg/dL (ref 8–23)
CO2: 25 mmol/L (ref 22–32)
Calcium: 8.9 mg/dL (ref 8.9–10.3)
Chloride: 100 mmol/L (ref 98–111)
Creatinine, Ser: 1.37 mg/dL — ABNORMAL HIGH (ref 0.44–1.00)
GFR, Estimated: 37 mL/min — ABNORMAL LOW (ref >60)
Glucose, Bld: 115 mg/dL — ABNORMAL HIGH (ref 70–99)
Potassium: 3.5 mmol/L (ref 3.5–5.1)
Sodium: 134 mmol/L — ABNORMAL LOW (ref 135–145)
Total Bilirubin: 1.2 mg/dL (ref 0.0–1.2)
Total Protein: 7.8 g/dL (ref 6.5–8.1)

## 2023-01-30 LAB — LIPASE, BLOOD: Lipase: 66 U/L — ABNORMAL HIGH (ref 11–51)

## 2023-01-30 NOTE — ED Provider Triage Note (Signed)
 Emergency Medicine Provider Triage Evaluation Note  [name] [name] , a 89 y.o. female  was evaluated in triage.  Pt complains of abd pain. Pt has a right inguinal hernia that pops out today and she was unable to reduce it.  She endorse sharp beruning pain.  No n/v/d, no fever, chills, no urinary discomfort  Review of Systems  Positive: As above Negative: As above  Physical Exam  BP (!) 154/93 (BP Location: Right Arm)   Pulse 81   Temp 98.6 F (37 C) (Oral)   Resp 18   SpO2 99%  Gen:   Awake, no distress   Resp:  Normal effort  MSK:   Moves extremities without difficulty  Other:  R inguinal hernia that was reducible by me by applying steady pressure for 2 minutes.  Pt felt better  Medical Decision Making  Medically screening exam initiated at 7:37 PM.  Appropriate orders placed.  [name] [name] was informed that the remainder of the evaluation will be completed by another provider, this initial triage assessment does not replace that evaluation, and the importance of remaining in the ED until their evaluation is complete.  Hernia reduction  Date/Time: 01/30/2023 7:38 PM  Performed by: Nivia Colon, PA-C Authorized by: Nivia Colon, PA-C  Local anesthesia used: no  Anesthesia: Local anesthesia used: no  Sedation: Patient sedated: no  Patient tolerance: patient tolerated the procedure well with no immediate complications Comments: R inguinal hernia that was reducible by me by applying steady pressure for 2 minutes.  Pt felt better       Nivia Colon, PA-C 01/30/23 1939

## 2023-01-30 NOTE — ED Triage Notes (Signed)
 Pt arrives from home via [facility] with reports of abdominal pain. Per report, abdominal pain, 9/10, sharp hx of umbilical, iguinal and hiatal hernial. Increased right inguinal swelling. She was doing laundry when she had sudden onset of pain. Vitals 150/74, hr 74, 98% RA.

## 2023-01-30 NOTE — ED Triage Notes (Signed)
 Pt reporting around 1730 she had onset of right lower abdominal pain, she says around that time she noticed a bulging of her hernia. The pain radiates up through her abdomen. No n/v. Says she did have some difficulty urinating after the pain started.

## 2023-01-31 LAB — URINALYSIS, ROUTINE W REFLEX MICROSCOPIC
Bilirubin Urine: NEGATIVE
Glucose, UA: NEGATIVE mg/dL
Hgb urine dipstick: NEGATIVE
Ketones, ur: NEGATIVE mg/dL
Nitrite: NEGATIVE
Protein, ur: 30 mg/dL — AB
Specific Gravity, Urine: 1.023 (ref 1.005–1.030)
pH: 7 (ref 5.0–8.0)

## 2023-01-31 NOTE — ED Provider Notes (Signed)
 [place] EMERGENCY DEPARTMENT AT [facility] Provider Note   CSN: [id number] Arrival date & time: 01/30/23  1918     History  Chief Complaint  Patient presents with   Abdominal Pain    [name] [name] is a 89 y.o. female.  The history is provided by the patient.  Patient history of CAD, cirrhosis, previous hernias presents with abdominal pain and return of her hernia. Patient reports around 6 PM she began having sudden onset of right lower quadrant pain and her hernia popped out.  No vomiting, she had had a normal bowel movement just prior to this.  She does report decreased urine output Prior to my evaluation, patient had her hernia reduced by the previous provider     Home Medications Prior to Admission medications   Medication Sig Start Date End Date Taking? Authorizing Provider  albuterol  (VENTOLIN  HFA) 108 (90 Base) MCG/ACT inhaler INHALE 2 PUFFS INTO THE LUNGS EVERY 6 (SIX) HOURS AS NEEDED FOR SHORTNESS OF BREATH (USE AS NEEDED OR BEFORE ACTIVITY FOR SHORTNESS OF BREATH OR WHEZZING) 10/01/21   Kassie Acquanetta Bradley, MD  atorvastatin  (LIPITOR) 20 MG tablet Take 1 tablet (20 mg total) by mouth daily. 12/25/22   Ladona Heinz, MD  Biotin 5 MG CAPS Take 500 mg by mouth daily.    [provider]  clopidogrel  (PLAVIX ) 75 MG tablet TAKE 1 TABLET BY MOUTH EVERY DAY 11/25/22   Ladona Heinz, MD  diphenhydrAMINE  (BENADRYL ) 25 mg capsule Take 25 mg by mouth every 6 (six) hours as needed.    [provider]  fexofenadine (ALLEGRA) 180 MG tablet Take 180 mg by mouth as needed for allergies or rhinitis. Usually 3 times a week per patient    [provider]  furosemide  (LASIX ) 20 MG tablet Take 1 tablet (20 mg total) by mouth as needed. Patient taking differently: Take 20 mg by mouth every Monday, Wednesday, and Friday. 07/05/22   Ladona Heinz, MD  levothyroxine  (SYNTHROID ) 50 MCG tablet Take 50 mcg by mouth as needed. In am    [provider]  meclizine   (ANTIVERT ) 25 MG tablet Take 25 mg by mouth 3 (three) times daily as needed for dizziness.    [provider]  mometasone (NASONEX) 50 MCG/ACT nasal spray Place 2 sprays into the nose daily.    [provider]  montelukast  (SINGULAIR ) 10 MG tablet Take 1 tablet (10 mg total) by mouth at bedtime. 10/29/22   Kassie Acquanetta Bradley, MD  Multiple Vitamin (MULTIVITAMIN WITH MINERALS) TABS tablet Take 1 tablet by mouth every other day.    [provider]  Peak Flow Meter DEVI 1 puff by Does not apply route in the morning and at bedtime. 05/12/20   Kassie Acquanetta Bradley, MD  Polyethyl Glycol-Propyl Glycol (SYSTANE) 0.4-0.3 % SOLN Place 1 drop into both eyes daily as needed (dry/irritated eyes.).    [provider]  potassium chloride  (KLOR-CON ) 10 MEQ tablet Take 1 tablet (10 mEq total) by mouth daily. Patient taking differently: Take 10 mEq by mouth daily. Only takes with lasix  05/12/20   Cantwell, Celeste C, PA-C  propranolol  (INDERAL ) 20 MG tablet Take 1 tablet (20 mg total) by mouth 3 (three) times daily. 04/11/22   Ladona Heinz, MD  SALINE NASAL SPRAY NA Place 1 spray into the nose daily.    [provider]  Tiotropium Bromide-Olodaterol (STIOLTO RESPIMAT ) 2.5-2.5 MCG/ACT AERS Inhale 2 puffs into the lungs daily. 10/29/22   Kassie Acquanetta Bradley, MD  Allergies    Aspirin , Bee venom, Ceclor [cefaclor], Simvastatin , Pneumococcal vac polyvalent, Statins, Crestor  [rosuvastatin ], and Sulfa antibiotics    Review of Systems   Review of Systems  Constitutional:  Negative for fever.  Gastrointestinal:  Positive for abdominal pain. Negative for vomiting.    Physical Exam Updated Vital Signs BP 133/70   Pulse 82   Temp 98.2 F (36.8 C) (Oral)   Resp 18   SpO2 100%  Physical Exam CONSTITUTIONAL: Elderly, no acute distress HEAD: Normocephalic/atraumatic EYES: EOMI ABDOMEN: soft, nontender, no rebound or guarding, bowel sounds noted throughout abdomen Easily reducible  periumbilical hernia GU:no cva tenderness NEURO: Pt is awake/alert/appropriate, moves all extremitiesx4.  No facial droop.   EXTREMITIES: full ROM SKIN: warm, color normal PSYCH: no abnormalities of mood noted, alert and oriented to situation  ED Results / Procedures / Treatments   Labs (all labs ordered are listed, but only abnormal results are displayed) Labs Reviewed  LIPASE, BLOOD - Abnormal; Notable for the following components:      Result Value   Lipase 66 (*)    All other components within normal limits  COMPREHENSIVE METABOLIC PANEL - Abnormal; Notable for the following components:   Sodium 134 (*)    Glucose, Bld 115 (*)    Creatinine, Ser 1.37 (*)    AST 106 (*)    ALT 60 (*)    Alkaline Phosphatase 417 (*)    GFR, Estimated 37 (*)    All other components within normal limits  CBC - Abnormal; Notable for the following components:   RBC 3.78 (*)    Hemoglobin 11.8 (*)    All other components within normal limits  URINALYSIS, ROUTINE W REFLEX MICROSCOPIC - Abnormal; Notable for the following components:   Color, Urine AMBER (*)    APPearance HAZY (*)    Protein, ur 30 (*)    Leukocytes,Ua TRACE (*)    Bacteria, UA RARE (*)    All other components within normal limits    EKG None  Radiology No results found.  Procedures Procedures    Medications Ordered in ED Medications - No data to display  ED Course/ Medical Decision Making/ A&P Clinical Course as of 01/31/23 0344  Fri Jan 31, 2023  0224 By the time of my evaluation, patient was overall feeling improved.  The hernia had already been reduced.  Labs been reviewed and largely at her baseline with renal insufficiency and mild liver dysfunction  No signs of incarcerated or strangulated hernia at this time.  Will give p.o. challenge and reassess [DW]  0343 Patient improved.  She is drinking fluids without difficulty.  No vomiting.  She is walking around in no distress.  No recurrence of her hernia.  Will  refer back to general surgery. Discussed strategies with patient to avoid overexertion to avoid further hernia complications [DW]    Clinical Course User Index [DW] Midge Golas, MD                                 Medical Decision Making Amount and/or Complexity of Data Reviewed Labs: ordered.           Final Clinical Impression(s) / ED Diagnoses Final diagnoses:  Unilateral recurrent inguinal hernia without obstruction or gangrene    Rx / DC Orders ED Discharge Orders     None         Midge Golas, MD 01/31/23 [phone]

## 2023-02-03 ENCOUNTER — Other Ambulatory Visit: Payer: Self-pay

## 2023-02-03 MED ORDER — PROPRANOLOL HCL 20 MG PO TABS: 20.0000 mg | ORAL_TABLET | Freq: Three times a day (TID) | ORAL | 0 refills | Status: DC

## 2023-02-06 ENCOUNTER — Telehealth: Payer: Self-pay | Admitting: Pulmonary Disease

## 2023-02-07 NOTE — Telephone Encounter (Signed)
Received fax from [facility] regarding surgery. Per letter fax to Santiago Glad, CMA at [phone]  Also phone number if there are any questions [phone]

## 2023-02-07 NOTE — Telephone Encounter (Signed)
Peri-operative Assessment of Pulmonary Risk for Non-Thoracic Surgery (Hernia repair):  For [name] [name], risk of perioperative pulmonary complications is increased by:  [ X]Age greater than 65 years  [ X]COPD  [ ] Serum albumin <3.5  [ ] Smoking  [ ] Obstructive sleep apnea  [ ] NYHA Class II Pulmonary Hypertension  ARISCAT: Intermediate risk (age and upper abdominal surgical incision increase risk) 13.3% risk of in-hospital post-op pulmonary complications (composite including respiratory failure, respiratory infection, pleural effusion, atelectasis, pneumothorax, bronchospasm, aspiration pneumonitis)  Respiratory complications generally occur in 1% of ASA Class I patients, 5% of ASA Class II and 10% of ASA Class III-IV patients These complications rarely result in mortality and include postoperative pneumonia, atelectasis, pulmonary embolism, ARDS and increased time requiring postoperative mechanical ventilation.  Overall, I recommend proceeding with the surgery if the risk for respiratory complications are outweighed by the potential benefits. This will need to be discussed between the patient and surgeon.  To reduce risks of respiratory complications, I recommend: --Pre- and post-operative incentive spirometry performed frequently while awake --Continue current bronchodilators including Stiolto --Defer to Cardiology or PCP regarding holding medications including Plavix prior to procedure  [name] [name], M.D. [facility] Pulmonary/Critical Care Medicine 02/07/2023 7:36 AM

## 2023-02-07 NOTE — Telephone Encounter (Signed)
Patient daughter calling for the 3rd time since yesterday. Advised patient we have received the fax from [facility] and our surgical clearance team is working on this.

## 2023-02-10 NOTE — Telephone Encounter (Signed)
I have faxed over Dr George Hugh risk assessment on this pt to [facility]  I called daughter, Amy, ok per DPR and left her a detailed msg Nothing further needed

## 2023-02-11 ENCOUNTER — Telehealth: Payer: Self-pay

## 2023-02-11 NOTE — Telephone Encounter (Signed)
   Pre-operative Risk Assessment    Patient Name: [name] [name]  DOB: 14-Sep-1931 MRN: [id number]   Date of last office visit: 04/30/2022 Dr. Yates Decamp MD Date of next office visit: NONE   Request for Surgical Clearance    Procedure:   Hernia Surgery  Date of Surgery:  Clearance TBD                                Surgeon:  Dr. Feliciana Rossetti MD Surgeon's Group or Practice Name:  [facility]  Phone number:  [phone] Fax number:  [phone] Attn: Santiago Glad CMA   Type of Clearance Requested:   - Medical  - Pharmacy:  Hold Clopidogrel (Plavix)     Type of Anesthesia:  General    Additional requests/questions:    Luellen Pucker   02/11/2023, 12:40 PM

## 2023-02-11 NOTE — Telephone Encounter (Signed)
ADDENDUM: CLEARANCE FORM DOES STATE: URGENT

## 2023-02-12 ENCOUNTER — Ambulatory Visit: Payer: Self-pay | Admitting: General Surgery

## 2023-02-12 NOTE — Patient Instructions (Addendum)
SURGICAL WAITING ROOM VISITATION Patients having surgery or a procedure may have no more than 2 support people in the waiting area - these visitors may rotate.    Children under the age of 24 must have an adult with them who is not the patient.  Due to an increase in RSV and influenza rates and associated hospitalizations, children ages 46 and under may not visit patients in [facility] hospitals.   If the patient needs to stay at the hospital during part of their recovery, the visitor guidelines for inpatient rooms apply. Pre-op nurse will coordinate an appropriate time for 1 support person to accompany patient in pre-op.  This support person may not rotate.    Please refer to the [facility] website for the visitor guidelines for Inpatients (after your surgery is over and you are in a regular room).       Your procedure is scheduled on: 02-17-23   Report to [facility] Main Entrance    Report to admitting at 10:45 AM   Call this number if you have problems the morning of surgery [phone]   Do not eat food :After Midnight.   After Midnight you may have the following liquids until 10:00 AM DAY OF SURGERY  Water Non-Citrus Juices (without pulp, NO RED-Apple, White grape, White cranberry) Black Coffee (NO MILK/CREAM OR CREAMERS, sugar ok)  Clear Tea (NO MILK/CREAM OR CREAMERS, sugar ok) regular and decaf                             Plain Jell-O (NO RED)                                           Fruit ices (not with fruit pulp, NO RED)                                     Popsicles (NO RED)                                                               Sports drinks like Gatorade (NO RED)                      If you have questions, please contact your surgeon's office.   FOLLOW  ANY ADDITIONAL PRE OP INSTRUCTIONS YOU RECEIVED FROM YOUR SURGEON'S OFFICE!!!     Oral Hygiene is also important to reduce your risk of infection.                                    Remember  - BRUSH YOUR TEETH THE MORNING OF SURGERY WITH YOUR REGULAR TOOTHPASTE   Do NOT smoke after Midnight   Take these medicines the morning of surgery with A SIP OF WATER:   Atorvastatin  Allegra  Levothyroxine  Propranolol  Okay to use inhalers and nasal spray  Meclizine if needed  Stop all vitamins and herbal supplements 7 days before surgery  Bring CPAP  mask and tubing day of surgery.                              You may not have any metal on your body including hair pins, jewelry, and body piercing             Do not wear make-up, lotions, powders, perfumes or deodorant  Do not wear nail polish including gel and S&S, artificial/acrylic nails, or any other type of covering on natural nails including finger and toenails. If you have artificial nails, gel coating, etc. that needs to be removed by a nail salon please have this removed prior to surgery or surgery may need to be canceled/ delayed if the surgeon/ anesthesia feels like they are unable to be safely monitored.   Do not shave  48 hours prior to surgery.    Do not bring valuables to the hospital. [facility] IS NOT RESPONSIBLE   FOR VALUABLES.   Contacts, dentures or bridgework may not be worn into surgery.   Bring small overnight bag day of surgery.   DO NOT BRING YOUR HOME MEDICATIONS TO THE HOSPITAL. PHARMACY WILL DISPENSE MEDICATIONS LISTED ON YOUR MEDICATION LIST TO YOU DURING YOUR ADMISSION IN THE HOSPITAL!   Special Instructions: Bring a copy of your healthcare power of attorney and living will documents the day of surgery if you haven't scanned them before.              Please read over the following fact sheets you were given: IF YOU HAVE QUESTIONS ABOUT YOUR PRE-OP INSTRUCTIONS PLEASE CALL [phone] G[facility]  If you received a COVID test during your pre-op visit  it is requested that you wear a mask when out in public, stay away from anyone that may not be feeling well and notify your surgeon if you develop  symptoms. If you test positive for Covid or have been in contact with anyone that has tested positive in the last 10 days please notify you surgeon.  [facility] - Preparing for Surgery Before surgery, you can play an important role.  Because skin is not sterile, your skin needs to be as free of germs as possible.  You can reduce the number of germs on your skin by washing with CHG (chlorahexidine gluconate) soap before surgery.  CHG is an antiseptic cleaner which kills germs and bonds with the skin to continue killing germs even after washing. Please DO NOT use if you have an allergy to CHG or antibacterial soaps.  If your skin becomes reddened/irritated stop using the CHG and inform your nurse when you arrive at Short Stay. Do not shave (including legs and underarms) for at least 48 hours prior to the first CHG shower.  You may shave your face/neck.  Please follow these instructions carefully:  1.  Shower with CHG Soap the night before surgery and the  morning of surgery.  2.  If you choose to wash your hair, wash your hair first as usual with your normal  shampoo.  3.  After you shampoo, rinse your hair and body thoroughly to remove the shampoo.                             4.  Use CHG as you would any other liquid soap.  You can apply chg directly to the skin and wash.  Gently with a scrungie or clean  washcloth.  5.  Apply the CHG Soap to your body ONLY FROM THE NECK DOWN.   Do   not use on face/ open                           Wound or open sores. Avoid contact with eyes, ears mouth and   genitals (private parts).                       Wash face,  Genitals (private parts) with your normal soap.             6.  Wash thoroughly, paying special attention to the area where your    surgery  will be performed.  7.  Thoroughly rinse your body with warm water from the neck down.  8.  DO NOT shower/wash with your normal soap after using and rinsing off the CHG Soap.                9.  Pat yourself dry  with a clean towel.            10.  Wear clean pajamas.            11.  Place clean sheets on your bed the night of your first shower and do not  sleep with pets. Day of Surgery : Do not apply any lotions/deodorants the morning of surgery.  Please wear clean clothes to the hospital/surgery center.  FAILURE TO FOLLOW THESE INSTRUCTIONS MAY RESULT IN THE CANCELLATION OF YOUR SURGERY  PATIENT SIGNATURE_________________________________  NURSE SIGNATURE__________________________________  ________________________________________________________________________

## 2023-02-12 NOTE — Progress Notes (Addendum)
COVID Vaccine Completed:  Yes  Date of COVID positive in last 90 days:  PCP - Ardean Larsen, MD Cardiologist - Sherilyn Banker, MD Pulmonologist - Chi Mechele Collin, MD  Chest x-ray -  EKG - 09-24-22 Epic Stress Test - 08-06-21 Epic ECHO - 09-23-22 Epic Cardiac Cath - 12-15-18 Epic Pacemaker/ICD device last checked: Spinal Cord Stimulator: Coronary CT - 03-05-19 Epic  Bowel Prep -   Sleep Study -  CPAP -   Fasting Blood Sugar -  Checks Blood Sugar _____ times a day  Last dose of GLP1 agonist-  N/A GLP1 instructions:  Hold 7 days before surgery    Last dose of SGLT-2 inhibitors-  N/A SGLT-2 instructions:  Hold 3 days before surgery    Blood Thinner Instructions:  Plavix Aspirin Instructions: Last Dose:  Activity level:  Can go up a flight of stairs and perform activities of daily living without stopping and without symptoms of chest pain or shortness of breath.  Able to exercise without symptoms  Unable to go up a flight of stairs without symptoms of     Anesthesia review:  CAD, severe aortic stenosis with TAVR, TIA, hepatic cirrhosis, CKD  AST 106, ALT 60 and alk phosphatase 417  Patient denies shortness of breath, fever, cough and chest pain at PAT appointment  Patient verbalized understanding of instructions that were given to them at the PAT appointment. Patient was also instructed that they will need to review over the PAT instructions again at home before surgery.

## 2023-02-13 ENCOUNTER — Encounter (HOSPITAL_COMMUNITY)
Admission: RE | Admit: 2023-02-13 | Discharge: 2023-02-13 | Disposition: A | Discharge status: Home / Self Care | Payer: Federal, State, Local not specified - PPO | Source: Ambulatory Visit | Attending: General Surgery | Admitting: General Surgery

## 2023-02-13 ENCOUNTER — Encounter (INDEPENDENT_AMBULATORY_CARE_PROVIDER_SITE_OTHER): Payer: Federal, State, Local not specified - PPO

## 2023-02-13 ENCOUNTER — Other Ambulatory Visit: Payer: Self-pay

## 2023-02-13 ENCOUNTER — Telehealth: Payer: Self-pay | Admitting: *Deleted

## 2023-02-13 ENCOUNTER — Encounter (HOSPITAL_COMMUNITY): Payer: Self-pay

## 2023-02-13 DIAGNOSIS — R0609 Other forms of dyspnea: Secondary | ICD-10-CM | POA: Insufficient documentation

## 2023-02-13 DIAGNOSIS — K429 Umbilical hernia without obstruction or gangrene: Secondary | ICD-10-CM | POA: Diagnosis not present

## 2023-02-13 DIAGNOSIS — I129 Hypertensive chronic kidney disease with stage 1 through stage 4 chronic kidney disease, or unspecified chronic kidney disease: Secondary | ICD-10-CM | POA: Diagnosis not present

## 2023-02-13 DIAGNOSIS — Z8673 Personal history of transient ischemic attack (TIA), and cerebral infarction without residual deficits: Secondary | ICD-10-CM | POA: Diagnosis not present

## 2023-02-13 DIAGNOSIS — K7460 Unspecified cirrhosis of liver: Secondary | ICD-10-CM | POA: Diagnosis not present

## 2023-02-13 DIAGNOSIS — Z01812 Encounter for preprocedural laboratory examination: Secondary | ICD-10-CM | POA: Insufficient documentation

## 2023-02-13 DIAGNOSIS — Z01810 Encounter for preprocedural cardiovascular examination: Secondary | ICD-10-CM | POA: Diagnosis not present

## 2023-02-13 DIAGNOSIS — N183 Chronic kidney disease, stage 3 unspecified: Secondary | ICD-10-CM | POA: Insufficient documentation

## 2023-02-13 DIAGNOSIS — J439 Emphysema, unspecified: Secondary | ICD-10-CM | POA: Diagnosis not present

## 2023-02-13 DIAGNOSIS — E039 Hypothyroidism, unspecified: Secondary | ICD-10-CM | POA: Diagnosis not present

## 2023-02-13 DIAGNOSIS — I447 Left bundle-branch block, unspecified: Secondary | ICD-10-CM | POA: Diagnosis not present

## 2023-02-13 DIAGNOSIS — K449 Diaphragmatic hernia without obstruction or gangrene: Secondary | ICD-10-CM | POA: Insufficient documentation

## 2023-02-13 DIAGNOSIS — I2510 Atherosclerotic heart disease of native coronary artery without angina pectoris: Secondary | ICD-10-CM | POA: Diagnosis not present

## 2023-02-13 DIAGNOSIS — Z9861 Coronary angioplasty status: Secondary | ICD-10-CM | POA: Insufficient documentation

## 2023-02-13 DIAGNOSIS — K4090 Unilateral inguinal hernia, without obstruction or gangrene, not specified as recurrent: Secondary | ICD-10-CM | POA: Insufficient documentation

## 2023-02-13 HISTORY — DX: Transient cerebral ischemic attack, unspecified: G45.9

## 2023-02-13 HISTORY — DX: Anemia, unspecified: D64.9

## 2023-02-13 NOTE — Telephone Encounter (Signed)
Left message for the pt to call back ASAP as she is going to need to be added on today per preop APP Robin Searing, NP. Surgeon office sent secure chat checking on clearance.

## 2023-02-13 NOTE — Progress Notes (Signed)
Virtual Visit via Telephone Note   Because of [name] [name]'s co-morbid illnesses, she is at least at moderate risk for complications without adequate follow up.  This format is felt to be most appropriate for this patient at this time.  The patient did not have access to video technology/had technical difficulties with video requiring transitioning to audio format only (telephone).  All issues noted in this document were discussed and addressed.  No physical exam could be performed with this format.  Please refer to the patient's chart for her consent to telehealth for [facility].  Evaluation Performed:  Preoperative cardiovascular risk assessment _____________   Date:  02/13/2023   Patient ID:  [name] [name], DOB 1931-05-02, MRN [id number] Patient Location:  Home Provider location:   Office  Primary Care Provider:  Ollen Bowl, MD Primary Cardiologist:  Yates Decamp, MD  Chief Complaint / Patient Profile   89 y.o. y/o female with a h/o CKD stage IIIb, cirrhosis of the liver, chronic dyspnea, CAD s/p PCI to LAD and angioplasty of D1 12/2018, severe aortic stenosis s/p TAVR completed on 01/2019, hypothyroidism who is pending hernia surgery and presents today for telephonic preoperative cardiovascular risk assessment.  History of Present Illness    [name] [name] is a 89 y.o. female who presents via audio/video conferencing for a telehealth visit today.  Pt was last seen in cardiology clinic on 04/30/2022 by Dr.Ganji.  At that time [name] [name] was doing well and remained asymptomatic.  She completed a 2D echo that showed stable valve function. The patient is now pending procedure as outlined above. Since her last visit, she has been doing well with no new cardiac complaints.  She is able to complete majority of her ADLs but does have home health coming into help assist due to her husband having Alzheimer's.  She is compliant with her medications and blood pressures have been  stable and well-controlled at 123/70.  She denies chest pain, shortness of breath, lower extremity edema, fatigue, palpitations, melena, hematuria, hemoptysis, diaphoresis, weakness, presyncope, syncope, orthopnea, and PND.    Past Medical History    Past Medical History:  Diagnosis Date   Anemia    Arthritis    Asthma    history of   CAD (coronary artery disease)    Cirrhosis (HCC)    CKD (chronic kidney disease)    Dyspnea    sometimes sitting.lying and on exertion   Emphysema lung (HCC)    GERD (gastroesophageal reflux disease)    History of hiatal hernia    History of transcatheter aortic valve replacement (TAVR) 01/26/2019   (TAVR  23 mm Evolute Pro in aortic position 01/26/2019) .    Hyperlipidemia    Hypertension    Hypothyroidism    Lipoma    left arm   Nasal polyps    Pneumonia    Pulmonary nodule    noted on pre TAVR CT   Samter's triad    Severe aortic stenosis    TIA (transient ischemic attack)    Past Surgical History:  Procedure Laterality Date   CHOLECYSTECTOMY  02/27/04   CORONARY ATHERECTOMY N/A 01/05/2019   Procedure: CORONARY ATHERECTOMY;  Surgeon: Yates Decamp, MD;  Location: [facility] INVASIVE CV LAB;  Service: Cardiovascular;  Laterality: N/A;   CORONARY BALLOON ANGIOPLASTY N/A 01/05/2019   Procedure: CORONARY BALLOON ANGIOPLASTY;  Surgeon: Yates Decamp, MD;  Location: [facility] INVASIVE CV LAB;  Service: Cardiovascular;  Laterality: N/A;   CORONARY STENT INTERVENTION  N/A 01/05/2019   Procedure: CORONARY STENT INTERVENTION;  Surgeon: Yates Decamp, MD;  Location: [facility] INVASIVE CV LAB;  Service: Cardiovascular;  Laterality: N/A;   CORONARY STENT INTERVENTION  01/05/2019   INCONTINENCE SURGERY  09/12/09   NASAL SINUS SURGERY     several     ORIF ANKLE FRACTURE Right 09/11/2013   Procedure: OPEN REDUCTION INTERNAL FIXATION (ORIF) ANKLE FRACTURE;  Surgeon: Verlee Rossetti, MD;  Location: [facility] ORS;  Service: Orthopedics;  Laterality: Right;   RIGHT/LEFT HEART CATH AND CORONARY  ANGIOGRAPHY N/A 12/15/2018   Procedure: RIGHT/LEFT HEART CATH AND CORONARY ANGIOGRAPHY;  Surgeon: Yates Decamp, MD;  Location: [facility] INVASIVE CV LAB;  Service: Cardiovascular;  Laterality: N/A;   ROTATOR CUFF REPAIR  09/27/2003   SINUS ENDO WITH FUSION Bilateral 09/08/2020   Procedure: Bilateral Revision of Endoscopic Sinus Surgery with Fusion and Intranasal Polypectomy;  Surgeon: Osborn Coho, MD;  Location: [facility] OR;  Service: ENT;  Laterality: Bilateral;   TRANSCATHETER AORTIC VALVE REPLACEMENT, TRANSFEMORAL  01/26/2019   TRANSCATHETER AORTIC VALVE REPLACEMENT, TRANSFEMORAL N/A 01/26/2019   Procedure: TRANSCATHETER AORTIC VALVE REPLACEMENT, TRANSFEMORAL;  Surgeon: Kathleene Hazel, MD;  Location: [facility] OR;  Service: Open Heart Surgery;  Laterality: N/A;   TRIGGER FINGER RELEASE  10/29/06    Allergies  Allergies  Allergen Reactions   Aspirin Anaphylaxis   Bee Venom Anaphylaxis   Ceclor [Cefaclor] Anaphylaxis   Simvastatin Other (See Comments)    Hair loss   Pneumococcal Vac Polyvalent Swelling    Other reaction(s): swelling and erythema   Statins Other (See Comments)    Elevated liver enzymes   Crestor [Rosuvastatin] Rash   Sulfa Antibiotics Rash    Home Medications    Prior to Admission medications   Medication Sig Start Date End Date Taking? Authorizing Provider  albuterol (VENTOLIN HFA) 108 (90 Base) MCG/ACT inhaler INHALE 2 PUFFS INTO THE LUNGS EVERY 6 (SIX) HOURS AS NEEDED FOR SHORTNESS OF BREATH (USE AS NEEDED OR BEFORE ACTIVITY FOR SHORTNESS OF BREATH OR WHEZZING) 10/01/21   Luciano Cutter, MD  Artificial Saliva (BIOTENE DRY MOUTH) LOZG Use as directed 1 Piece in the mouth or throat as needed (dry mouth).    [provider]  atorvastatin (LIPITOR) 20 MG tablet Take 1 tablet (20 mg total) by mouth daily. 12/25/22   Yates Decamp, MD  clopidogrel (PLAVIX) 75 MG tablet TAKE 1 TABLET BY MOUTH EVERY DAY 11/25/22   Yates Decamp, MD  diphenhydrAMINE (BENADRYL) 25 mg capsule Take  25 mg by mouth every 6 (six) hours as needed for allergies.    [provider]  fexofenadine (ALLEGRA) 180 MG tablet Take 180 mg by mouth daily as needed for allergies or rhinitis.    [provider]  fluticasone (FLONASE) 50 MCG/ACT nasal spray Place 2 sprays into both nostrils at bedtime.    [provider]  furosemide (LASIX) 20 MG tablet Take 1 tablet (20 mg total) by mouth as needed. Patient taking differently: Take 20 mg by mouth every Monday, Wednesday, and Friday. 07/05/22   Yates Decamp, MD  levothyroxine (SYNTHROID) 50 MCG tablet Take 50 mcg by mouth daily before breakfast. In am    [provider]  montelukast (SINGULAIR) 10 MG tablet Take 1 tablet (10 mg total) by mouth at bedtime. 10/29/22   Luciano Cutter, MD  Multiple Vitamin (MULTIVITAMIN WITH MINERALS) TABS tablet Take 1 tablet by mouth every other day.    [provider]  Peak Flow Meter DEVI 1 puff by  Does not apply route in the morning and at bedtime. 05/12/20   Luciano Cutter, MD  Polyethyl Glycol-Propyl Glycol (SYSTANE) 0.4-0.3 % SOLN Place 1 drop into both eyes daily as needed (dry/irritated eyes.).    [provider]  potassium chloride (KLOR-CON) 10 MEQ tablet Take 1 tablet (10 mEq total) by mouth daily. Patient taking differently: Take 10 mEq by mouth every Monday, Wednesday, and Friday. 05/12/20   Cantwell, Celeste C, PA-C  propranolol (INDERAL) 20 MG tablet Take 1 tablet (20 mg total) by mouth 3 (three) times daily. Patient taking differently: Take 20 mg by mouth 2 (two) times daily. 02/03/23   Yates Decamp, MD  sodium chloride (OCEAN) 0.65 % nasal spray Place 1 spray into the nose every other day.    [provider]  Tiotropium Bromide-Olodaterol (STIOLTO RESPIMAT) 2.5-2.5 MCG/ACT AERS Inhale 2 puffs into the lungs daily. 10/29/22   Luciano Cutter, MD    Physical Exam    Vital Signs:  [name] [name] does not have vital signs available for review  today.123/70  Given telephonic nature of communication, physical exam is limited. AAOx3. NAD. Normal affect.  Speech and respirations are unlabored.  Accessory Clinical Findings    None  Assessment & Plan    1.  Preoperative Cardiovascular Risk Assessment: -Patient's RCRI score is 6.6% The patient affirms she has been doing well without any new cardiac symptoms. They are able to achieve 5 METS without cardiac limitations. Therefore, based on ACC/AHA guidelines, the patient would be at acceptable risk for the planned procedure without further cardiovascular testing. The patient was advised that if she develops new symptoms prior to surgery to contact our office to arrange for a follow-up visit, and she verbalized understanding.   The patient was advised that if she develops new symptoms prior to surgery to contact our office to arrange for a follow-up visit, and she verbalized understanding.  Patient can hold Plavix for 5 days prior to procedure and should restart postprocedure when surgically safe and hemostasis is achieved.  A copy of this note will be routed to requesting surgeon.  Time:   Today, I have spent 7 minutes with the patient with telehealth technology discussing medical history, symptoms, and management plan.     Napoleon Form, Leodis Rains, NP  02/13/2023, 11:24 AM

## 2023-02-13 NOTE — Telephone Encounter (Signed)
I s/w the pt and she has confirmed the tele preop appt 3 pm today. Med rec and consent are done.

## 2023-02-13 NOTE — Telephone Encounter (Signed)
I s/w the pt and she has confirmed the tele preop appt 3 pm today. Med rec and consent are done.      Patient Consent for Virtual Visit        [name] [name] has provided verbal consent on 02/13/2023 for a virtual visit (video or telephone).   CONSENT FOR VIRTUAL VISIT FOR:  [name] [name]  By participating in this virtual visit I agree to the following:  I hereby voluntarily request, consent and authorize [facility] and its employed or contracted physicians, physician assistants, nurse practitioners or other licensed health care professionals (the Practitioner), to provide me with telemedicine health care services (the "Services") as deemed necessary by the treating Practitioner. I acknowledge and consent to receive the Services by the Practitioner via telemedicine. I understand that the telemedicine visit will involve communicating with the Practitioner through live audiovisual communication technology and the disclosure of certain medical information by electronic transmission. I acknowledge that I have been given the opportunity to request an in-person assessment or other available alternative prior to the telemedicine visit and am voluntarily participating in the telemedicine visit.  I understand that I have the right to withhold or withdraw my consent to the use of telemedicine in the course of my care at any time, without affecting my right to future care or treatment, and that the Practitioner or I may terminate the telemedicine visit at any time. I understand that I have the right to inspect all information obtained and/or recorded in the course of the telemedicine visit and may receive copies of available information for a reasonable fee.  I understand that some of the potential risks of receiving the Services via telemedicine include:  Delay or interruption in medical evaluation due to technological equipment failure or disruption; Information transmitted may not be sufficient  (e.g. poor resolution of images) to allow for appropriate medical decision making by the Practitioner; and/or  In rare instances, security protocols could fail, causing a breach of personal health information.  Furthermore, I acknowledge that it is my responsibility to provide information about my medical history, conditions and care that is complete and accurate to the best of my ability. I acknowledge that Practitioner's advice, recommendations, and/or decision may be based on factors not within their control, such as incomplete or inaccurate data provided by me or distortions of diagnostic images or specimens that may result from electronic transmissions. I understand that the practice of medicine is not an exact science and that Practitioner makes no warranties or guarantees regarding treatment outcomes. I acknowledge that a copy of this consent can be made available to me via my patient portal [facility] MyChart), or I can request a printed copy by calling the office of [facility].    I understand that my insurance will be billed for this visit.   I have read or had this consent read to me. I understand the contents of this consent, which adequately explains the benefits and risks of the Services being provided via telemedicine.  I have been provided ample opportunity to ask questions regarding this consent and the Services and have had my questions answered to my satisfaction. I give my informed consent for the services to be provided through the use of telemedicine in my medical care

## 2023-02-13 NOTE — Progress Notes (Addendum)
Case: [id number] Date/Time: 02/17/23 1245   Procedures:      LAPAROSCOPIC RIGHT INGUINAL HERNIA REPAIR WITH MESH (Right)     OPEN UMBILICAL HERNIA REPAIR WITH MESH   Anesthesia type: General   Pre-op diagnosis:      RIGHT INGUINAL HERNIA      UMBILICAL HERNIA   Location: [facility] ROOM 02 / [facility]   Surgeons: Kinsinger, De Blanch, MD       DISCUSSION: [name] [name] is an 89 yo female who presents to PAT prior to surgery above. PMH of HTN, CAD s/p PCI (2020), severe AS s/p TAVR (2021) c/b mild-mod paravalvular leak, LBBB, chronic DOE, COPD, asthma, hx of CVA/TIA (09/2022), GERD, large hiatal hernia, cirrhosis, CKD stage 3, hypothyroidism.  Patient went to the ED for recurrent acute hernia pain on 11/26/22 and 01/31/23. It was successfully reduced at beside. No signs of strangulation or incarceration. Now scheduled for repair.  Patient follows with Cardiology for severe AS now s/p TAVR in 2021 and CAD s/p PCI to LAD in 2020. She has residual high-grade stenosis of RI artery. She also has a mild to moderate paravalvular leak after her TAVR. Last seen by Dr. Jacinto Halim on 04/30/2022. EKG shows chronic LBBB. She is on a statin. Cannot take ASA due to allergy but does take Plavix. BP has been controlled. Patient evaluated for pre op clearance on 02/13/23. She denied any cardiac symptoms. Cleared for surgery:  "Preoperative Cardiovascular Risk Assessment: -Patient's RCRI score is 6.6% The patient affirms she has been doing well without any new cardiac symptoms. They are able to achieve 5 METS without cardiac limitations. Therefore, based on ACC/AHA guidelines, the patient would be at acceptable risk for the planned procedure without further cardiovascular testing. The patient was advised that if she develops new symptoms prior to surgery to contact our office to arrange for a follow-up visit, and she verbalized understanding. The patient was advised that if she develops new symptoms prior to surgery to contact our  office to arrange for a follow-up visit, and she verbalized understanding. Patient can hold Plavix for 5 days prior to procedure and should restart postprocedure when surgically safe and hemostasis is achieved."  Patient follows with Pulmonology for COPD. Last seen on 10/29/22 and breathing noted to be stable. She is on inhalers. 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 Admitted from 9/1-9/2. No obvious source of embolization found after CVA w/u. Advised continue Plavix and statin. Seen by Neurology in follow up on 11/28/22 where she reported mild improvement in her vision. She had lab evaluation for temporal arteritis which was negative. Advised f/u with ophthalmology.   VS: BP 124/66   Pulse 78   Temp 36.6 C (Oral)   Resp 20   Ht 5' (1.524 m)   Wt 50.8 kg   SpO2 99%   BMI 21.87 kg/m   PROVIDERS: Ollen Bowl, MD Cardiologist - Sherilyn Banker, MD Pulmonologist - Chi Mechele Collin, MD Neurology: Levert Feinstein, MD  LABS: Labs reviewed: Acceptable for surgery. CKD stable. Transaminitis stable.Mild anemia noted. No thrombocytopenia. (all labs ordered are listed, but only abnormal results are displayed)  Labs Reviewed - No data to display   IMAGES: MRI brain 09/22/22:  IMPRESSION: 1. No acute intracranial abnormality. 2. Mild age-related cerebral atrophy with chronic small vessel ischemic disease. 3. Changes of chronic pansinusitis with sequelae of prior endoscopic sinonasal  surgery.  CTA H/N 09/22/22:  IMPRESSION: No large vessel occlusion or proximal hemodynamically significant stenosis.  CT chest 12/25/20:  IMPRESSION: 1. Stable appearance of small bilateral pulmonary compatible with a benign process. No new or enlarging lung nodules identified. 2. Large hiatal hernia. 3. Morphologic features of the liver compatible with cirrhosis. 4.  Aortic Atherosclerosis (ICD10-I70.0) and Emphysema (ICD10-J43.9). Coronary artery calcifications.  EKG:   CV:  Echo 09/23/22:  IMPRESSIONS    1. Diastology may not be accurate in presence of MV and MV annular calcification. Left ventricular ejection fraction, by estimation, is 60 to 65%. The left ventricle has normal function. The left ventricle has no regional wall motion abnormalities. There is mild left ventricular hypertrophy. Left ventricular diastolic parameters are consistent with Grade II diastolic dysfunction (pseudonormalization). Elevated left ventricular end-diastolic pressure.  2. Right ventricular systolic function is normal. The right ventricular size is normal. There is normal pulmonary artery systolic pressure. The estimated right ventricular systolic pressure is 26.3 mmHg.  3. Left atrial size was moderately dilated.  4. The mitral valve is degenerative. Mild mitral valve regurgitation. No evidence of mitral stenosis. Moderate mitral annular calcification.  5. Normal TAVR function. The aortic valve is tricuspid. Aortic valve regurgitation is not visualized. Aortic valve sclerosis is present, with no evidence of aortic valve stenosis. There is a 23 mm CoreValve-EvolutR prosthetic (TAVR) valve present in the  aortic position. Procedure Date: 01/26/19.  Lexiscan Nuclear stress test 08/06/2021:  Nondiagnostic ECG stress. The heart rate response was consistent with Regadenoson.  Myocardial perfusion is normal. Overall LV systolic function is normal without regional wall motion abnormalities. Stress LV EF: 60%.  Compared to the study done on 05/15/2020, no significant change.  Low risk.  CTA chest/tavr 03/05/2019:  IMPRESSION: 1. Well positioned 23 mm Evolut Pro valve with normal appearing leaflets. Good coaptation with no HALT/HAM. Although target diameters for inflow, waist, outflow in systole are slightly low the mechanism of worsening PVL appears to be primarily  a large area of calcification posterior to the stent valve from the native non coronary cusp. This area of calcium measures 4.9 x 5.7 mm and is best seen on SA images at 7:00.  LHC 01/05/2019:  Successful orbital atherectomy followed by stenting of the proximal LAD with a 3.0 x 18 mm resolute Onyx DES and proximal segment postdilated with a 3.5 x 8 mm Sapphire [place].  Stenosis 90% to 0%.  TIMI-3 to TIMI-3 flow.  Orbital atherectomy followed by balloon angioplasty with 2.5 x 15 mm Sapphire balloon for D1, 90% to less than 15 to 20% stenosis.  TIMI-3 to TIMI-3 flow.   Recommendation: Patient will be observed overnight in view of right forearm hematoma.  She also has had right groin access which is closed with minx and will be observed for any hematoma development as she had mild oozing which was extracted in the lab.   She will be scheduled for TAVR as per structural heart disease team.  120 mL contrast utilized.  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    Hyperlipidemia    Hypertension    Hypothyroidism    Lipoma    left arm   Nasal polyps    Pneumonia    Pulmonary nodule    noted on pre TAVR CT   Samter's triad    Severe aortic stenosis    TIA (transient ischemic attack)     Past Surgical History:  Procedure Laterality Date   CHOLECYSTECTOMY  02/27/04   CORONARY ATHERECTOMY N/A 01/05/2019   Procedure: CORONARY ATHERECTOMY;  Surgeon: Yates Decamp, MD;  [facility];  Service: Cardiovascular;  Laterality: N/A;   CORONARY BALLOON ANGIOPLASTY N/A 01/05/2019    Procedure: CORONARY BALLOON ANGIOPLASTY;  Surgeon: Yates Decamp, MD;  [facility];  Service: Cardiovascular;  Laterality: N/A;   CORONARY STENT INTERVENTION N/A 01/05/2019   Procedure: CORONARY STENT INTERVENTION;  Surgeon: Yates Decamp, MD;  [facility];  Service: Cardiovascular;  Laterality: N/A;   CORONARY STENT INTERVENTION  01/05/2019   INCONTINENCE SURGERY  09/12/09   NASAL SINUS SURGERY     several     ORIF ANKLE FRACTURE Right 09/11/2013   Procedure: OPEN REDUCTION INTERNAL FIXATION (ORIF) ANKLE FRACTURE;  Surgeon: Verlee Rossetti, MD;  Location: [facility];  Service: Orthopedics;  Laterality: Right;   RIGHT/LEFT HEART CATH AND CORONARY ANGIOGRAPHY N/A 12/15/2018   Procedure: RIGHT/LEFT HEART CATH AND CORONARY ANGIOGRAPHY;  Surgeon: Yates Decamp, MD;  [facility];  Service: Cardiovascular;  Laterality: N/A;   ROTATOR CUFF REPAIR  09/27/2003   SINUS ENDO WITH FUSION Bilateral 09/08/2020   Procedure: Bilateral Revision of Endoscopic Sinus Surgery with Fusion and Intranasal Polypectomy;  Surgeon: Osborn Coho, MD;  Location: [facility];  Service: ENT;  Laterality: Bilateral;   TRANSCATHETER AORTIC VALVE REPLACEMENT, TRANSFEMORAL  01/26/2019   TRANSCATHETER AORTIC VALVE REPLACEMENT, TRANSFEMORAL N/A 01/26/2019   Procedure: TRANSCATHETER AORTIC VALVE REPLACEMENT, TRANSFEMORAL;  Surgeon: Kathleene Hazel, MD;  Location[facility];  Service: Open Heart Surgery;  Laterality: N/A;   TRIGGER FINGER RELEASE  10/29/06    MEDICATIONS:  albuterol (VENTOLIN HFA) 108 (90 Base) MCG/ACT inhaler   Artificial Saliva (BIOTENE DRY MOUTH) LOZG   atorvastatin (LIPITOR) 20 MG tablet   clopidogrel (PLAVIX) 75 MG tablet   diphenhydrAMINE (BENADRYL) 25 mg capsule   fexofenadine (ALLEGRA) 180 MG tablet   fluticasone (FLONASE) 50 MCG/ACT nasal spray   furosemide (LASIX) 20 MG tablet   levothyroxine (SYNTHROID) 50 MCG tablet   montelukast (SINGULAIR) 10 MG tablet   Multiple  Vitamin (MULTIVITAMIN WITH MINERALS) TABS tablet   Peak Flow Meter DEVI   Polyethyl Glycol-Propyl Glycol (SYSTANE) 0.4-0.3 % SOLN   potassium chloride (KLOR-CON) 10 MEQ tablet   propranolol (INDERAL) 20 MG tablet   sodium chloride (OCEAN) 0.65 % nasal spray   Tiotropium Bromide-Olodaterol (STIOLTO RESPIMAT) 2.5-2.5 MCG/ACT AERS   No current facility-administered medications for this encounter.   Marcille Blanco [facility] Short Stay/Anesthesiology [facility] Phone [phone] 02/13/2023 10:52 AM

## 2023-02-13 NOTE — Telephone Encounter (Signed)
Good Morning Dr. Jacinto Halim  We have received a surgical clearance request for [name] [name] for hernia repair.  She has a PMH of CAD with PCI to LAD and angioplasty to D1 on 12/2018, severe aortic stenosis s/p TAVR on 01/2019.  Per our protocol guidance for holding Plavix will have to come from primary cardiologist due to proximal LAD stent. Can you please comment on surgical clearance and guidance on holding Plavix prior to upcoming hernia repair. Please forward you guidance and recommendations to [facility] PREOP   Thank you, Robin Searing, NP

## 2023-02-15 ENCOUNTER — Other Ambulatory Visit: Payer: Self-pay | Admitting: Cardiology

## 2023-02-17 ENCOUNTER — Other Ambulatory Visit: Payer: Self-pay

## 2023-02-17 ENCOUNTER — Encounter (HOSPITAL_COMMUNITY): Payer: Self-pay | Admitting: General Surgery

## 2023-02-17 ENCOUNTER — Encounter (HOSPITAL_COMMUNITY)
Admission: RE | Disposition: A | Discharge status: Home Health | Payer: Self-pay | Source: Home / Self Care | Attending: General Surgery

## 2023-02-17 ENCOUNTER — Ambulatory Visit (HOSPITAL_COMMUNITY): Payer: Federal, State, Local not specified - PPO | Admitting: Physician Assistant

## 2023-02-17 ENCOUNTER — Observation Stay (HOSPITAL_COMMUNITY)
Admission: RE | Admit: 2023-02-17 | Discharge: 2023-02-19 | Disposition: A | Discharge status: Home Health | Payer: Federal, State, Local not specified - PPO | Attending: General Surgery | Admitting: General Surgery

## 2023-02-17 ENCOUNTER — Ambulatory Visit (HOSPITAL_COMMUNITY): Payer: Federal, State, Local not specified - PPO | Admitting: Anesthesiology

## 2023-02-17 DIAGNOSIS — K429 Umbilical hernia without obstruction or gangrene: Secondary | ICD-10-CM | POA: Diagnosis not present

## 2023-02-17 DIAGNOSIS — K432 Incisional hernia without obstruction or gangrene: Secondary | ICD-10-CM | POA: Insufficient documentation

## 2023-02-17 DIAGNOSIS — K4091 Unilateral inguinal hernia, without obstruction or gangrene, recurrent: Secondary | ICD-10-CM | POA: Diagnosis present

## 2023-02-17 DIAGNOSIS — Z8719 Personal history of other diseases of the digestive system: Secondary | ICD-10-CM

## 2023-02-17 DIAGNOSIS — K4090 Unilateral inguinal hernia, without obstruction or gangrene, not specified as recurrent: Principal | ICD-10-CM

## 2023-02-17 HISTORY — PX: INGUINAL HERNIA REPAIR: SHX194

## 2023-02-17 HISTORY — PX: UMBILICAL HERNIA REPAIR: SHX196

## 2023-02-17 LAB — COMPREHENSIVE METABOLIC PANEL
ALT: 59 U/L — ABNORMAL HIGH (ref 0–44)
AST: 111 U/L — ABNORMAL HIGH (ref 15–41)
Albumin: 3 g/dL — ABNORMAL LOW (ref 3.5–5.0)
Alkaline Phosphatase: 374 U/L — ABNORMAL HIGH (ref 38–126)
Anion gap: 9 (ref 5–15)
BUN: 23 mg/dL (ref 8–23)
CO2: 24 mmol/L (ref 22–32)
Calcium: 8.7 mg/dL — ABNORMAL LOW (ref 8.9–10.3)
Chloride: 103 mmol/L (ref 98–111)
Creatinine, Ser: 1.24 mg/dL — ABNORMAL HIGH (ref 0.44–1.00)
GFR, Estimated: 42 mL/min — ABNORMAL LOW (ref >60)
Glucose, Bld: 115 mg/dL — ABNORMAL HIGH (ref 70–99)
Potassium: 3.8 mmol/L (ref 3.5–5.1)
Sodium: 136 mmol/L (ref 135–145)
Total Bilirubin: 1.3 mg/dL — ABNORMAL HIGH (ref 0.0–1.2)
Total Protein: 6.7 g/dL (ref 6.5–8.1)

## 2023-02-17 LAB — CBC
HCT: 34.2 % — ABNORMAL LOW (ref 36.0–46.0)
Hemoglobin: 11 g/dL — ABNORMAL LOW (ref 12.0–15.0)
MCH: 31.1 pg (ref 26.0–34.0)
MCHC: 32.2 g/dL (ref 30.0–36.0)
MCV: 96.6 fL (ref 80.0–100.0)
Platelets: 175 10*3/uL (ref 150–400)
RBC: 3.54 MIL/uL — ABNORMAL LOW (ref 3.87–5.11)
RDW: 14.4 % (ref 11.5–15.5)
WBC: 8 10*3/uL (ref 4.0–10.5)
nRBC: 0 % (ref 0.0–0.2)

## 2023-02-17 LAB — CREATININE, SERUM
Creatinine, Ser: 1.2 mg/dL — ABNORMAL HIGH (ref 0.44–1.00)
GFR, Estimated: 43 mL/min — ABNORMAL LOW (ref >60)

## 2023-02-17 SURGERY — REPAIR, HERNIA, INGUINAL, LAPAROSCOPIC
Anesthesia: General | Laterality: Right

## 2023-02-17 MED ORDER — FLUTICASONE PROPIONATE 50 MCG/ACT NA SUSP
2.0000 | Freq: Every day | NASAL | Status: DC
  Filled 2023-02-17: qty 16

## 2023-02-17 MED ORDER — FENTANYL CITRATE PF 50 MCG/ML IJ SOSY
25.0000 ug | PREFILLED_SYRINGE | INTRAMUSCULAR | Status: DC | PRN
  Administered 2023-02-17 (×2): 25 ug via INTRAVENOUS

## 2023-02-17 MED ORDER — ATORVASTATIN CALCIUM 20 MG PO TABS
20.0000 mg | ORAL_TABLET | Freq: Every day | ORAL | Status: DC
  Administered 2023-02-18 – 2023-02-19 (×2): 20 mg via ORAL
  Filled 2023-02-17 (×2): qty 1

## 2023-02-17 MED ORDER — ONDANSETRON 4 MG PO TBDP: 4.0000 mg | ORAL_TABLET | Freq: Four times a day (QID) | ORAL | Status: DC | PRN

## 2023-02-17 MED ORDER — ACETAMINOPHEN 500 MG PO TABS
1000.0000 mg | ORAL_TABLET | ORAL | Status: AC
  Administered 2023-02-17: 1000 mg via ORAL
  Filled 2023-02-17: qty 2

## 2023-02-17 MED ORDER — DIPHENHYDRAMINE HCL 50 MG/ML IJ SOLN: 12.5000 mg | Freq: Four times a day (QID) | INTRAMUSCULAR | Status: DC | PRN

## 2023-02-17 MED ORDER — CLINDAMYCIN PHOSPHATE 900 MG/50ML IV SOLN
900.0000 mg | INTRAVENOUS | Status: AC
  Administered 2023-02-17: 900 mg via INTRAVENOUS
  Filled 2023-02-17: qty 50

## 2023-02-17 MED ORDER — LIDOCAINE HCL (CARDIAC) PF 100 MG/5ML IV SOSY
PREFILLED_SYRINGE | INTRAVENOUS | Status: DC | PRN
  Administered 2023-02-17: 100 mg via INTRAVENOUS

## 2023-02-17 MED ORDER — LEVOTHYROXINE SODIUM 50 MCG PO TABS
50.0000 ug | ORAL_TABLET | Freq: Every day | ORAL | Status: DC
  Administered 2023-02-18 – 2023-02-19 (×2): 50 ug via ORAL
  Filled 2023-02-17 (×2): qty 1

## 2023-02-17 MED ORDER — DIPHENHYDRAMINE HCL 25 MG PO CAPS: 25.0000 mg | ORAL_CAPSULE | Freq: Four times a day (QID) | ORAL | Status: DC | PRN

## 2023-02-17 MED ORDER — POLYVINYL ALCOHOL 1.4 % OP SOLN: 1.0000 [drp] | Freq: Every day | OPHTHALMIC | Status: DC | PRN

## 2023-02-17 MED ORDER — BIOTENE DRY MOUTH MT LOZG: 1.0000 | LOZENGE | OROMUCOSAL | Status: DC | PRN

## 2023-02-17 MED ORDER — PHENYLEPHRINE 80 MCG/ML (10ML) SYRINGE FOR IV PUSH (FOR BLOOD PRESSURE SUPPORT)
PREFILLED_SYRINGE | INTRAVENOUS | Status: AC
  Filled 2023-02-17: qty 40

## 2023-02-17 MED ORDER — HYDROMORPHONE HCL 1 MG/ML IJ SOLN: 0.5000 mg | INTRAMUSCULAR | Status: DC | PRN

## 2023-02-17 MED ORDER — PROPOFOL 500 MG/50ML IV EMUL
INTRAVENOUS | Status: AC
  Filled 2023-02-17: qty 100

## 2023-02-17 MED ORDER — CHLORHEXIDINE GLUCONATE 0.12 % MT SOLN
15.0000 mL | Freq: Once | OROMUCOSAL | Status: AC
  Administered 2023-02-17: 15 mL via OROMUCOSAL

## 2023-02-17 MED ORDER — PROPRANOLOL HCL 10 MG PO TABS
20.0000 mg | ORAL_TABLET | Freq: Two times a day (BID) | ORAL | Status: DC
  Administered 2023-02-17 – 2023-02-19 (×4): 20 mg via ORAL
  Filled 2023-02-17 (×4): qty 2

## 2023-02-17 MED ORDER — ROCURONIUM BROMIDE 100 MG/10ML IV SOLN
INTRAVENOUS | Status: DC | PRN
  Administered 2023-02-17: 60 mg via INTRAVENOUS

## 2023-02-17 MED ORDER — ADULT MULTIVITAMIN W/MINERALS CH
1.0000 | ORAL_TABLET | ORAL | Status: DC
  Administered 2023-02-18: 1 via ORAL
  Filled 2023-02-17: qty 1

## 2023-02-17 MED ORDER — SUGAMMADEX SODIUM 200 MG/2ML IV SOLN
INTRAVENOUS | Status: DC | PRN
  Administered 2023-02-17: 200 mg via INTRAVENOUS

## 2023-02-17 MED ORDER — PROPOFOL 10 MG/ML IV BOLUS
INTRAVENOUS | Status: DC | PRN
  Administered 2023-02-17: 60 mg via INTRAVENOUS

## 2023-02-17 MED ORDER — LACTATED RINGERS IV SOLN: INTRAVENOUS | Status: DC

## 2023-02-17 MED ORDER — ORAL CARE MOUTH RINSE
15.0000 mL | Freq: Once | OROMUCOSAL | Status: AC
Start: 2023-02-17 — End: 2023-02-17

## 2023-02-17 MED ORDER — UMECLIDINIUM BROMIDE 62.5 MCG/ACT IN AEPB
1.0000 | INHALATION_SPRAY | Freq: Every day | RESPIRATORY_TRACT | Status: DC
  Administered 2023-02-18 – 2023-02-19 (×2): 1 via RESPIRATORY_TRACT
  Filled 2023-02-17: qty 7

## 2023-02-17 MED ORDER — ARFORMOTEROL TARTRATE 15 MCG/2ML IN NEBU
15.0000 ug | INHALATION_SOLUTION | Freq: Two times a day (BID) | RESPIRATORY_TRACT | Status: DC
  Administered 2023-02-17 – 2023-02-19 (×4): 15 ug via RESPIRATORY_TRACT
  Filled 2023-02-17 (×4): qty 2

## 2023-02-17 MED ORDER — PHENYLEPHRINE HCL (PRESSORS) 10 MG/ML IV SOLN
INTRAVENOUS | Status: DC | PRN
  Administered 2023-02-17 (×2): 160 ug via INTRAVENOUS

## 2023-02-17 MED ORDER — OXYCODONE HCL 5 MG/5ML PO SOLN: 5.0000 mg | Freq: Once | ORAL | Status: AC | PRN

## 2023-02-17 MED ORDER — MONTELUKAST SODIUM 10 MG PO TABS
10.0000 mg | ORAL_TABLET | Freq: Every day | ORAL | Status: DC
  Administered 2023-02-17 – 2023-02-18 (×2): 10 mg via ORAL
  Filled 2023-02-17 (×2): qty 1

## 2023-02-17 MED ORDER — OXYCODONE HCL 5 MG PO TABS
5.0000 mg | ORAL_TABLET | Freq: Once | ORAL | Status: AC | PRN
Start: 2023-02-17 — End: 2023-02-17
  Administered 2023-02-17: 5 mg via ORAL

## 2023-02-17 MED ORDER — ENOXAPARIN SODIUM 40 MG/0.4ML IJ SOSY: 40.0000 mg | PREFILLED_SYRINGE | INTRAMUSCULAR | Status: DC

## 2023-02-17 MED ORDER — FENTANYL CITRATE PF 50 MCG/ML IJ SOSY
PREFILLED_SYRINGE | INTRAMUSCULAR | Status: AC
  Administered 2023-02-17: 25 ug via INTRAVENOUS
  Filled 2023-02-17: qty 1

## 2023-02-17 MED ORDER — FENTANYL CITRATE (PF) 100 MCG/2ML IJ SOLN
INTRAMUSCULAR | Status: AC
  Filled 2023-02-17: qty 2

## 2023-02-17 MED ORDER — LORATADINE 10 MG PO TABS
10.0000 mg | ORAL_TABLET | Freq: Every day | ORAL | Status: DC
  Administered 2023-02-18 – 2023-02-19 (×2): 10 mg via ORAL
  Filled 2023-02-17 (×2): qty 1

## 2023-02-17 MED ORDER — PHENYLEPHRINE HCL-NACL 20-0.9 MG/250ML-% IV SOLN
INTRAVENOUS | Status: DC | PRN
  Administered 2023-02-17: 30 ug/min via INTRAVENOUS

## 2023-02-17 MED ORDER — ONDANSETRON HCL 4 MG/2ML IJ SOLN
INTRAMUSCULAR | Status: DC | PRN
  Administered 2023-02-17: 4 mg via INTRAVENOUS

## 2023-02-17 MED ORDER — ALBUTEROL SULFATE HFA 108 (90 BASE) MCG/ACT IN AERS
INHALATION_SPRAY | RESPIRATORY_TRACT | Status: AC
  Filled 2023-02-17: qty 6.7

## 2023-02-17 MED ORDER — 0.9 % SODIUM CHLORIDE (POUR BTL) OPTIME
TOPICAL | Status: DC | PRN
  Administered 2023-02-17: 1000 mL

## 2023-02-17 MED ORDER — ENOXAPARIN SODIUM 30 MG/0.3ML IJ SOSY
30.0000 mg | PREFILLED_SYRINGE | INTRAMUSCULAR | Status: DC
  Administered 2023-02-18 – 2023-02-19 (×2): 30 mg via SUBCUTANEOUS
  Filled 2023-02-17 (×2): qty 0.3

## 2023-02-17 MED ORDER — ALBUTEROL SULFATE (2.5 MG/3ML) 0.083% IN NEBU: 2.5000 mg | INHALATION_SOLUTION | Freq: Four times a day (QID) | RESPIRATORY_TRACT | Status: DC | PRN

## 2023-02-17 MED ORDER — ONDANSETRON HCL 4 MG/2ML IJ SOLN
INTRAMUSCULAR | Status: AC
  Filled 2023-02-17: qty 2

## 2023-02-17 MED ORDER — OXYCODONE HCL 5 MG PO TABS: 5.0000 mg | ORAL_TABLET | ORAL | Status: DC | PRN

## 2023-02-17 MED ORDER — BUPIVACAINE-EPINEPHRINE 0.25% -1:200000 IJ SOLN
INTRAMUSCULAR | Status: DC | PRN
  Administered 2023-02-17: 50 mL

## 2023-02-17 MED ORDER — FUROSEMIDE 20 MG PO TABS
20.0000 mg | ORAL_TABLET | ORAL | Status: DC
  Administered 2023-02-19: 20 mg via ORAL
  Filled 2023-02-17: qty 1

## 2023-02-17 MED ORDER — BUPIVACAINE-EPINEPHRINE 0.25% -1:200000 IJ SOLN
INTRAMUSCULAR | Status: AC
  Filled 2023-02-17: qty 1

## 2023-02-17 MED ORDER — DIPHENHYDRAMINE HCL 12.5 MG/5ML PO ELIX: 12.5000 mg | ORAL_SOLUTION | Freq: Four times a day (QID) | ORAL | Status: DC | PRN

## 2023-02-17 MED ORDER — OXYCODONE HCL 5 MG PO TABS
ORAL_TABLET | ORAL | Status: AC
  Filled 2023-02-17: qty 1

## 2023-02-17 MED ORDER — ONDANSETRON HCL 4 MG/2ML IJ SOLN: 4.0000 mg | Freq: Four times a day (QID) | INTRAMUSCULAR | Status: DC | PRN

## 2023-02-17 MED ORDER — ONDANSETRON HCL 4 MG/2ML IJ SOLN
4.0000 mg | Freq: Once | INTRAMUSCULAR | Status: DC | PRN
Start: 2023-02-17 — End: 2023-02-17

## 2023-02-17 MED ORDER — CHLORHEXIDINE GLUCONATE CLOTH 2 % EX PADS: 6.0000 | MEDICATED_PAD | Freq: Once | CUTANEOUS | Status: DC

## 2023-02-17 MED ORDER — TRAMADOL HCL 50 MG PO TABS
50.0000 mg | ORAL_TABLET | Freq: Four times a day (QID) | ORAL | Status: DC | PRN
  Administered 2023-02-17 – 2023-02-19 (×3): 50 mg via ORAL
  Filled 2023-02-17 (×3): qty 1

## 2023-02-17 MED ORDER — FENTANYL CITRATE (PF) 100 MCG/2ML IJ SOLN
INTRAMUSCULAR | Status: DC | PRN
  Administered 2023-02-17: 100 ug via INTRAVENOUS

## 2023-02-17 SURGICAL SUPPLY — 49 items
APPLIER CLIP 5 13 M/L LIGAMAX5 (MISCELLANEOUS)
BAG COUNTER SPONGE SURGICOUNT (BAG) IMPLANT
BENZOIN TINCTURE PRP APPL 2/3 (GAUZE/BANDAGES/DRESSINGS) IMPLANT
BINDER ABDOMINAL 12 ML 46-62 (SOFTGOODS) IMPLANT
BLADE SURG SZ10 CARB STEEL (BLADE) ×2 IMPLANT
BNDG ADH 1X3 SHEER STRL LF (GAUZE/BANDAGES/DRESSINGS) IMPLANT
CABLE HIGH FREQUENCY MONO STRZ (ELECTRODE) ×2 IMPLANT
CHLORAPREP W/TINT 26 (MISCELLANEOUS) ×2 IMPLANT
CLIP APPLIE 5 13 M/L LIGAMAX5 (MISCELLANEOUS) IMPLANT
COVER SURGICAL LIGHT HANDLE (MISCELLANEOUS) ×2 IMPLANT
DERMABOND ADVANCED .7 DNX12 (GAUZE/BANDAGES/DRESSINGS) ×2 IMPLANT
DEVICE SECURE STRAP 25 ABSORB (INSTRUMENTS) ×2 IMPLANT
DEVICE TROCAR PUNCTURE CLOSURE (ENDOMECHANICALS) ×2 IMPLANT
DRAPE LAPAROSCOPIC ABDOMINAL (DRAPES) ×2 IMPLANT
ELECT REM PT RETURN 15FT ADLT (MISCELLANEOUS) ×2 IMPLANT
GLOVE BIO SURGEON STRL SZ7 (GLOVE) ×2 IMPLANT
GLOVE BIOGEL PI IND STRL 7.0 (GLOVE) ×2 IMPLANT
GLOVE SURG SS PI 7.0 STRL IVOR (GLOVE) ×2 IMPLANT
GOWN STRL REUS W/ TWL LRG LVL3 (GOWN DISPOSABLE) ×2 IMPLANT
GOWN STRL REUS W/ TWL XL LVL3 (GOWN DISPOSABLE) IMPLANT
IRRIG SUCT STRYKERFLOW 2 WTIP (MISCELLANEOUS)
IRRIGATION SUCT STRKRFLW 2 WTP (MISCELLANEOUS) IMPLANT
KIT BASIN OR (CUSTOM PROCEDURE TRAY) ×4 IMPLANT
KIT TURNOVER KIT A (KITS) IMPLANT
MARKER SKIN DUAL TIP RULER LAB (MISCELLANEOUS) ×2 IMPLANT
MESH 3DMAX MID 4X6 RT LRG (Mesh General) IMPLANT
NDL HYPO 22X1.5 SAFETY MO (MISCELLANEOUS) ×2 IMPLANT
NEEDLE HYPO 22X1.5 SAFETY MO (MISCELLANEOUS) ×2
PACK GENERAL/GYN (CUSTOM PROCEDURE TRAY) ×2 IMPLANT
SCISSORS LAP 5X35 DISP (ENDOMECHANICALS) IMPLANT
SET TUBE SMOKE EVAC HIGH FLOW (TUBING) ×2 IMPLANT
SLEEVE Z-THREAD 5X100MM (TROCAR) ×2 IMPLANT
SPIKE FLUID TRANSFER (MISCELLANEOUS) ×2 IMPLANT
STRIP CLOSURE SKIN 1/2X4 (GAUZE/BANDAGES/DRESSINGS) IMPLANT
SUT MNCRL AB 4-0 PS2 18 (SUTURE) ×2 IMPLANT
SUT NOVA NAB GS-21 0 18 T12 DT (SUTURE) IMPLANT
SUT PDS AB 0 CT1 36 (SUTURE) IMPLANT
SUT STRATAFIX SPIRAL PDS3-0 (SUTURE) IMPLANT
SUT VIC AB 2-0 SH 27X BRD (SUTURE) ×2 IMPLANT
SUT VIC AB 3-0 SH 27X BRD (SUTURE) ×2 IMPLANT
SUT VICRYL 0 UR6 27IN ABS (SUTURE) IMPLANT
SUT VLOC 3-0 9IN GRN (SUTURE) ×2 IMPLANT
SYR CONTROL 10ML LL (SYRINGE) ×2 IMPLANT
TOWEL OR 17X26 10 PK STRL BLUE (TOWEL DISPOSABLE) ×2 IMPLANT
TRAY FOLEY MTR SLVR 16FR STAT (SET/KITS/TRAYS/PACK) ×2 IMPLANT
TRAY LAPAROSCOPIC (CUSTOM PROCEDURE TRAY) ×2 IMPLANT
TROCAR KII 8X100ML NONTHREADED (TROCAR) ×2 IMPLANT
TROCAR Z-THREAD OPTICAL 5X100M (TROCAR) ×2 IMPLANT
YANKAUER SUCT BULB TIP 10FT TU (MISCELLANEOUS) ×2 IMPLANT

## 2023-02-17 NOTE — Anesthesia Preprocedure Evaluation (Addendum)
Anesthesia Evaluation  Patient identified by MRN, date of birth, ID band Patient awake    Reviewed: Allergy & Precautions, NPO status , Patient's Chart, lab work & pertinent test results, reviewed documented beta blocker date and time   History of Anesthesia Complications Negative for: history of anesthetic complications  Airway Mallampati: III  TM Distance: >3 FB     Dental no notable dental hx.    Pulmonary shortness of breath and with exertion, asthma , pneumonia, resolved, COPD, neg recent URI, neg PE   breath sounds clear to auscultation + decreased breath sounds      Cardiovascular hypertension, (-) angina + CAD (low risk stress test 2023) and +CHF  (-) Past MI, (-) Cardiac Stents, (-) CABG and (-) Orthopnea + dysrhythmias + Valvular Problems/Murmurs (s/p TAVR) AS  Rhythm:Regular Rate:Normal     Neuro/Psych neg Seizures TIA Neuromuscular disease    GI/Hepatic hiatal hernia,GERD  ,,(+) Cirrhosis         Endo/Other  Hypothyroidism    Renal/GU CRFRenal disease     Musculoskeletal  (+) Arthritis ,    Abdominal   Peds  Hematology  (+) Blood dyscrasia, anemia   Anesthesia Other Findings   Reproductive/Obstetrics                              Anesthesia Physical Anesthesia Plan  ASA: 3  Anesthesia Plan: General   Post-op Pain Management:    Induction: Intravenous  PONV Risk Score and Plan: 2 and Ondansetron and Dexamethasone  Airway Management Planned: Oral ETT  Additional Equipment:   Intra-op Plan:   Post-operative Plan: Extubation in OR  Informed Consent: I have reviewed the patients History and Physical, chart, labs and discussed the procedure including the risks, benefits and alternatives for the proposed anesthesia with the patient or authorized representative who has indicated his/her understanding and acceptance.     Dental advisory given  Plan Discussed with:  CRNA  Anesthesia Plan Comments:          Anesthesia Quick Evaluation

## 2023-02-17 NOTE — Anesthesia Procedure Notes (Signed)
Procedure Name: Intubation Date/Time: 02/17/2023 2:12 PM  Performed by: Randa Evens, CRNAPre-anesthesia Checklist: Patient identified, Emergency Drugs available, Suction available and Patient being monitored Patient Re-evaluated:Patient Re-evaluated prior to induction Oxygen Delivery Method: Circle System Utilized Preoxygenation: Pre-oxygenation with 100% oxygen Induction Type: IV induction Ventilation: Mask ventilation without difficulty Laryngoscope Size: Mac and 3 Grade View: Grade I Tube type: Oral Tube size: 7.0 mm Number of attempts: 1 Airway Equipment and Method: Stylet and Oral airway Placement Confirmation: ETT inserted through vocal cords under direct vision, positive ETCO2 and breath sounds checked- equal and bilateral Secured at: 21 cm Tube secured with: Tape Dental Injury: Teeth and Oropharynx as per pre-operative assessment

## 2023-02-17 NOTE — Op Note (Signed)
Preop diagnosis: right inguinal hernia, incisional hernia  Postop diagnosis: right inguinal hernia, 2 cm supraumbilical incisional hernia  Procedure: laparoscopic TAP Right inguinal hernia repair with mesh, open primary 2 cm incisional hernia repair  Surgeon: Feliciana Rossetti, M.D.  Asst: none  Anesthesia: Gen.   Indications for procedure: [name] [name] is a 89 y.o. female with symptoms of pain and enlarging Right inguinal hernia(s). After discussing risks, alternatives and benefits she decided on laparoscopic repair and was brought to day surgery for repair.  Description of procedure: The patient was brought into the operative suite, placed supine. Anesthesia was administered with endotracheal tube. Patient was strapped in place. The patient was prepped and draped in the usual sterile fashion.  A supraumbilical transverse incision was made cautery was used dissect down to the hernia sac which was dissected in 360 degrees.  The hernia sac was then entered with an 11 mm trocar.  Pneumoperitoneum was applied with high flow low opening pressure.  Laparoscope was inserted to confirm position.  There was a large pantaloon type right inguinal hernia.  Cautery was used to dissect the peritoneum away from the right lower quadrant and reduced the hernia out of the space.  The round ligament was divided with cautery.  Pubic tubercle was identified.  A large right mid weight 3D max mesh was inserted and sutured with 2-0 Vicryl medially to the lacunar ligament. The mesh was positioned flat and directly up against the direct and indirect areas.  Peritoneum incision was closed with 3 oh STRATAFIX in running fashion.  The hernia sac was sutured to the closer as well.  The CO2 was evacuated while watching to ensure the mesh did not migrate.  The incisional hernia defect was 2 cm. The fascia of the incisional hernia was closed with interrupted 0 PDS.  All skin incisions were closed with 4-0 monocryl subcu stitch. The  patient awoke from anesthesia and was brought to PACU in stable condition.  Findings: Right pantaloon inguinal hernia, 2 cm supraumbilical incisional hernia  Specimen: none  Blood loss: 20 ml  Local anesthesia: 50 ml Marcaine  Complications: none  Implant: right large midweight Bard 3D max mesh    Feliciana Rossetti, M.D. General, Bariatric, & Minimally Invasive Surgery [facility], [place] 3:19 PM 02/17/2023

## 2023-02-17 NOTE — Anesthesia Postprocedure Evaluation (Signed)
Anesthesia Post Note  Patient: [name]  Procedure(s) Performed: LAPAROSCOPIC RIGHT INGUINAL HERNIA REPAIR WITH MESH (Right) PRIMARY UMBILICAL HERNIA REPAIR     Patient location during evaluation: PACU Anesthesia Type: General Level of consciousness: awake and alert Pain management: pain level controlled Vital Signs Assessment: post-procedure vital signs reviewed and stable Respiratory status: spontaneous breathing, nonlabored ventilation, respiratory function stable and patient connected to nasal cannula oxygen Cardiovascular status: blood pressure returned to baseline and stable Postop Assessment: no apparent nausea or vomiting Anesthetic complications: no Comments: ~20 beat run of V-Tach noted in PACU. Patient asymptomatic. Will send electrolytes and have patient admitted to continuous telemetry unit.   No notable events documented.  Last Vitals:  Vitals:   02/17/23 1532 02/17/23 1545  BP: (!) 145/84 (!) 147/79  Pulse: 65 66  Resp: 14 14  Temp: (!) 36.1 C   SpO2: 100% 99%    Last Pain:  Vitals:   02/17/23 1532  TempSrc:   PainSc: Asleep                 Mariann Barter

## 2023-02-17 NOTE — H&P (Signed)
Chief Complaint  Patient presents with   pt seen in ED 1/9 Unilateral recurrent inguinal   Subjective   [name] [name] is a 89 y.o. female new patient in today for: History of Present Illness The patient, with a history of multiple hernias and lung issues, presents with discomfort and pain from her hernias. The patient has an inguinal hernia, an umbilical hernia that has been present for years, and a hiatal hernia. The patient also has a history of bladder tacking from a previous hysterectomy. The patient's lung condition, which includes asthma and non-smoking emphysema, is a concern for the upcoming hernia surgery. The patient's lung function is at 43% due to one functioning lung and the presence of the stomach in the chest cavity. The patient also has a leaking heart valve. The patient is able to move around her home but gets short of breath and has difficulty with stairs due to her knee condition.  She was in the ED last month for incarceration of her right inguinal hernia. It was reduced after relaxation and manual palpation  Social Drivers of Health with Concerns   Received from [facility] [employer] [facility]  Social Network  Housing Stability: Unknown (02/06/2023)  Housing Stability Vital Sign  Homeless in the Last Year: No    No data to display      Outpatient Medications Prior to Visit  Medication Sig Dispense Refill  albuterol MDI, PROVENTIL, VENTOLIN, PROAIR, HFA 90 mcg/actuation inhaler as directed  clopidogreL (PLAVIX) 75 mg tablet Take 1 tablet by mouth once daily  fexofenadine (ALLEGRA) 180 MG tablet Take 180 mg by mouth  FUROsemide (LASIX) 20 MG tablet as directed  levothyroxine (SYNTHROID) 50 MCG tablet 1 TABLET IN THE MORNING ON AN EMPTY STOMACH BY MOUTH ONCE A DAY. DX: E03.9 90 DAYS  meclizine (ANTIVERT) 25 mg tablet Take 25 mg by mouth 3 (three) times daily as needed  mometasone (NASONEX) 50 mcg/actuation nasal spray 2 SPRAYS IN EACH NOSTRIL ONCE A DAY AS NEEDED NASALLY 30 DAYS 90   montelukast (SINGULAIR) 10 mg tablet Take 10 mg by mouth once daily  multivitamin with minerals tablet Take 1 tablet by mouth every other day  potassium chloride (KLOR-CON) 10 MEQ ER tablet Take by mouth  propranoloL (INDERAL) 20 MG tablet Take 1 tablet by mouth 3 (three) times daily  sodium chloride (CHILDREN'S SALINE NASAL SPRAY NASAL) Place 1 spray into one nostril  STIOLTO RESPIMAT 2.5-2.5 mcg/actuation inhaler Inhale 2 inhalations into the lungs   No facility-administered medications prior to visit.   Review of Systems  Constitutional: Positive for malaise/fatigue.  HENT: Negative.  Eyes: Negative.  Respiratory: Positive for shortness of breath.  Cardiovascular: Negative.  Gastrointestinal: Positive for abdominal pain.  Genitourinary: Negative.  Musculoskeletal: Negative.  Skin: Negative.  Neurological: Negative.  Endo/Heme/Allergies: Negative.  Psychiatric/Behavioral: Negative.    Objective   Vitals:  02/06/23 1450  BP: 107/73  Pulse: 74  Temp: 36.3 C (97.3 F)  SpO2: 96%  Weight: 51.1 kg (112 lb 9.6 oz)  Height: 152.4 cm (5')  PainSc: 0-No pain   Body mass index is 21.99 kg/m. Physical Exam Constitutional:  Appearance: Normal appearance.  HENT:  Head: Normocephalic and atraumatic.  Pulmonary:  Effort: Pulmonary effort is normal.  Abdominal:  Comments: Moderate right inguinal hernia, moderate umbilical hernia  Musculoskeletal:  General: Normal range of motion.  Cervical back: Normal range of motion.  Neurological:  General: No focal deficit present.  Mental Status: She is alert and oriented to person, place, and  time. Mental status is at baseline.  Psychiatric:  Mood and Affect: Mood normal.  Behavior: Behavior normal.  Thought Content: Thought content normal.   I reviewed CT scan showing moderate right inguinal hernia and umbilical hernia of 3 cm diameter  Assessment/Plan:   Assessment & Plan Inguinal and Umbilical Hernias Patient presents  with two palpable hernias, one inguinal and one umbilical. The inguinal hernia is causing significant discomfort. The patient has a history of previous hernia repair. Discussed the risks and benefits of surgical intervention, considering the patient's age and pulmonary status. We discussed etiology of hernias and how they can cause pain. We discussed options for inguinal hernia repair vs observation. We discussed details of the surgery of general anesthesia, surgical approach and incisions, dissecting the sack away from vas deference, testicular vessels and nerves and placement of mesh. We discussed risks of bleeding, infection, recurrence, injury to vas deference, testicular vessels, nerve injury, and chronic pain. She showed good understanding and wanted proceed with minimally invasive right inguinal hernia repair and umbilical hernia repair as outpatient.   -Consult with Pulmonology for preoperative evaluation and optimization.  Hiatal Hernia Patient has a known hiatal hernia, which is not currently causing significant symptoms. No intervention planned at this time. -Continue monitoring symptoms.  Chronic Lung Disease Patient has a history of asthma and non-smoking related emphysema, with one functioning lung and a CO2 exchange of 43%. The patient's pulmonary status may impact the surgical plan and recovery. -Consult with Pulmonology for preoperative evaluation and optimization. -Consider potential need for postoperative ventilator support.  Knee Arthritis Patient has a history of knee arthritis, which limits mobility. No intervention planned at this time. -Continue monitoring symptoms.  General Health Maintenance / Followup Plans -Plan for postoperative recovery, potentially involving activation of long-term care insurance policy. -Follow-up 2-3 weeks postoperatively to assess recovery.  Diagnoses and all orders for this visit:  Non-recurrent unilateral inguinal hernia without obstruction  or gangrene  Incisional hernia without obstruction or gangrene

## 2023-02-17 NOTE — Transfer of Care (Signed)
Immediate Anesthesia Transfer of Care Note  Patient: [name] [name]  Procedure(s) Performed: LAPAROSCOPIC RIGHT INGUINAL HERNIA REPAIR WITH MESH (Right) PRIMARY UMBILICAL HERNIA REPAIR  Patient Location: PACU  Anesthesia Type:General  Level of Consciousness: sedated  Airway & Oxygen Therapy: Patient Spontanous Breathing and Patient connected to face mask oxygen  Post-op Assessment: Report given to RN and Post -op Vital signs reviewed and stable  Post vital signs: Reviewed and stable  Last Vitals:  Vitals Value Taken Time  BP 145/84 02/17/23 1532  Temp    Pulse 63 02/17/23 1534  Resp 9 02/17/23 1534  SpO2 100 % 02/17/23 1534  Vitals shown include unfiled device data.  Last Pain:  Vitals:   02/17/23 1204  TempSrc:   PainSc: 0-No pain         Complications: No notable events documented.

## 2023-02-18 ENCOUNTER — Other Ambulatory Visit: Payer: Self-pay

## 2023-02-18 ENCOUNTER — Encounter (HOSPITAL_COMMUNITY): Payer: Self-pay | Admitting: General Surgery

## 2023-02-18 DIAGNOSIS — K4091 Unilateral inguinal hernia, without obstruction or gangrene, recurrent: Secondary | ICD-10-CM | POA: Diagnosis not present

## 2023-02-18 MED ORDER — HYDROCODONE-ACETAMINOPHEN 5-325 MG PO TABS: 1.0000 | ORAL_TABLET | Freq: Four times a day (QID) | ORAL | 0 refills | Status: DC | PRN

## 2023-02-18 NOTE — Evaluation (Signed)
Physical Therapy Evaluation Patient Details Name: [name] [name] MRN: [id number] DOB: December 04, 1931 Today's Date: 02/18/2023  History of Present Illness  89 yo female s/p R inguinal hernia repair with mesh 02/17/23. Hx of hernias, TAVR, CKD, cirrhosis,CAD, chronic lung disease, OA  Clinical Impression  On eval, pt was Supv level for mobility. She walked ~135 feet with a RW. Pt tolerated activity fairly well. She reports she has dyspnea with exertion at baseline. O2 91% on RA, dyspnea 2/4 with ambulation on today. Assisted pt into recliner to have her breakfast. Recommend HHPT f/u, pt is requesting a RW for home. Recommend daily hallway ambulation with nursing and/or mobility team as able.         If plan is discharge home, recommend the following: A little help with walking and/or transfers;A little help with bathing/dressing/bathroom;Assistance with cooking/housework;Assist for transportation;Help with stairs or ramp for entrance   Can travel by private vehicle        Equipment Recommendations Rolling walker (2 wheels)  Recommendations for Other Services       Functional Status Assessment Patient has had a recent decline in their functional status and demonstrates the ability to make significant improvements in function in a reasonable and predictable amount of time.     Precautions / Restrictions Precautions Precautions: Fall Precaution Comments: abd sg Restrictions Weight Bearing Restrictions Per Provider Order: No      Mobility  Bed Mobility               General bed mobility comments: oob in recliner    Transfers Overall transfer level: Needs assistance Equipment used: Rolling walker (2 wheels) Transfers: Sit to/from Stand Supvervision                  Ambulation/Gait Ambulation/Gait assistance: Supervision Gait Distance (Feet): 135 Feet Assistive device: Rolling walker (2 wheels) Gait Pattern/deviations: Step-through pattern, Decreased stride length        General Gait Details: Steady gait with RW. O2 91% on RA. 1 brief standing rest break taken at 'pt's request. Dyspnea 2/4.  Stairs            Wheelchair Mobility     Tilt Bed    Modified Rankin (Stroke Patients Only)       Balance Overall balance assessment: Needs assistance           Standing balance-Leahy Scale: Fair                               Pertinent Vitals/Pain Pain Assessment Pain Assessment: Faces Faces Pain Scale: Hurts a little bit Pain Location: abdomen Pain Descriptors / Indicators: Discomfort, Sore Pain Intervention(s): Limited activity within patient's tolerance, Monitored during session, Repositioned    Home Living Family/patient expects to be discharged to:: Private residence Living Arrangements: Spouse/significant other (pt takes care of her spouse (dementia) on wknds/nights; has caregivers through week) Available Help at Discharge: Family;Available PRN/intermittently Type of Home: House         Home Layout: Able to live on main level with bedroom/bathroom Home Equipment: Wheelchair - manual;BSC/3in1 Additional Comments: Is  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; sets out his clothing, assists with all IADL, needing frequent supervision    Prior Function Prior Level of Function : Independent/Modified Independent             Mobility Comments: no AD ADLs Comments: Mod I needing rest breaks throughout the day     Extremity/Trunk Assessment  Upper Extremity Assessment Upper Extremity Assessment: Overall WFL for tasks assessed    Lower Extremity Assessment Lower Extremity Assessment: Generalized weakness    Cervical / Trunk Assessment Cervical / Trunk Assessment: Kyphotic  Communication   Communication Communication: Hearing impairment  Cognition Arousal: Alert Behavior During Therapy: WFL for tasks assessed/performed Overall Cognitive Status: Within Functional Limits for tasks assessed                                           General Comments      Exercises     Assessment/Plan    PT Assessment Patient needs continued PT services  PT Problem List Decreased strength;Decreased activity tolerance;Decreased balance;Decreased mobility;Decreased knowledge of use of DME;Pain       PT Treatment Interventions DME instruction;Gait training;Functional mobility training;Therapeutic activities;Therapeutic exercise;Patient/family education;Balance training    PT Goals (Current goals can be found in the Care Plan section)  Acute Rehab PT Goals Patient Stated Goal: regain independence PT Goal Formulation: With patient Time For Goal Achievement: 03/04/23 Potential to Achieve Goals: Good    Frequency Min 1X/week     Co-evaluation               AM-PAC PT "6 Clicks" Mobility  Outcome Measure Help needed turning from your back to your side while in a flat bed without using bedrails?: A Little Help needed moving from lying on your back to sitting on the side of a flat bed without using bedrails?: A Little Help needed moving to and from a bed to a chair (including a wheelchair)?: A Little Help needed standing up from a chair using your arms (e.g., wheelchair or bedside chair)?: A Little Help needed to walk in hospital room?: A Little Help needed climbing 3-5 steps with a railing? : A Little 6 Click Score: 18    End of Session   Activity Tolerance: Patient tolerated treatment well Patient left: in chair;with call bell/phone within reach        Time: 0927-0939 PT Time Calculation (min) (ACUTE ONLY): 12 min   Charges:   PT Evaluation $PT Eval Low Complexity: 1 Low   PT General Charges $$ ACUTE PT VISIT: 1 Visit            Faye Ramsay, PT Acute Rehabilitation  Office: [phone]

## 2023-02-18 NOTE — Progress Notes (Signed)
This RN attempted to review AVS w/ pt  & daughter (Amy via phone). Pt's daughter has concerns about d/c. O2 sats on RA sitting are 98% & 96% per pt with ambulation. PT has recommended a RW for home use. 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 Amy is trying to set up a HH aide to stay overnight tonight. Amy can not stay tonight due to early morning conflicts in am in [place] and her own physical limitations in caring for her parents. Amy asked about d/c time. This RN stated she  would f/u w/ TOC and call Amy back.

## 2023-02-18 NOTE — Progress Notes (Signed)
No follow up at this time w/ Amy due to pt's daughter is attempting to appeal the d/c - TOC & primary nurse following.

## 2023-02-18 NOTE — Discharge Summary (Signed)
Physician Discharge Summary  [name] [name] WJX:[id number] DOB: 30-Dec-1931 DOA: 02/17/2023  PCP: Ollen Bowl, MD  Admit date: 02/17/2023 Discharge date:  02/18/2023   Recommendations for Outpatient Follow-up:   (include homehealth, outpatient follow-up instructions, specific recommendations for PCP to follow-up on, etc.)   Follow-up Information     Ruhama Lehew, De Blanch, MD Follow up on 03/14/2023.   Specialty: General Surgery Contact information: [address] [place] [address] [phone]                Discharge Diagnoses:  Principal Problem:   Right inguinal hernia Active Problems:   S/P inguinal hernia repair   Surgical Procedure: lap R inguinal hernia repair with mesh, open umbilical hernia  Discharge Condition: Good Disposition: Home  Diet recommendation: reg diet, 1 protein shake a day   Hospital Course:  89 yo female with right inguinal hernia underwent lap right inguinal hernia. She was observed overnight post op. She did well, ambulated to the bathroom and back, tolerated a diet and was discharged home POD 1.  Discharge Instructions  Discharge Instructions     Diet - low sodium heart healthy   Complete by: As directed    Discharge wound care:   Complete by: As directed    Shower normal or sponge bath today. Normal bath in 10 days. Glue to stay on for 10-14 days. No bandage needed.   Increase activity slowly   Complete by: As directed       Allergies as of 02/18/2023       Reactions   Aspirin Anaphylaxis   Bee Venom Anaphylaxis   Ceclor [cefaclor] Anaphylaxis   Simvastatin Other (See Comments)   Hair loss   Pneumococcal Vac Polyvalent Swelling   Other reaction(s): swelling and erythema   Statins Other (See Comments)   Elevated liver enzymes   Crestor [rosuvastatin] Rash   Sulfa Antibiotics Rash        Medication List     TAKE these medications    albuterol 108 (90 Base) MCG/ACT inhaler Commonly known  as: VENTOLIN HFA INHALE 2 PUFFS INTO THE LUNGS EVERY 6 (SIX) HOURS AS NEEDED FOR SHORTNESS OF BREATH (USE AS NEEDED OR BEFORE ACTIVITY FOR SHORTNESS OF BREATH OR WHEZZING)   atorvastatin 20 MG tablet Commonly known as: LIPITOR Take 1 tablet (20 mg total) by mouth daily.   Biotene Dry Mouth Lozg Use as directed 1 Piece in the mouth or throat as needed (dry mouth).   clopidogrel 75 MG tablet Commonly known as: PLAVIX TAKE 1 TABLET BY MOUTH EVERY DAY   diphenhydrAMINE 25 mg capsule Commonly known as: BENADRYL Take 25 mg by mouth every 6 (six) hours as needed for allergies.   fexofenadine 180 MG tablet Commonly known as: ALLEGRA Take 180 mg by mouth daily as needed for allergies or rhinitis.   fluticasone 50 MCG/ACT nasal spray Commonly known as: FLONASE Place 2 sprays into both nostrils at bedtime.   furosemide 20 MG tablet Commonly known as: LASIX Take 1 tablet (20 mg total) by mouth as needed. What changed: when to take this   HYDROcodone-acetaminophen 5-325 MG tablet Commonly known as: NORCO/VICODIN Take 1 tablet by mouth every 6 (six) hours as needed for moderate pain (pain score 4-6).   levothyroxine 50 MCG tablet Commonly known as: SYNTHROID Take 50 mcg by mouth daily before breakfast. In am   montelukast 10 MG tablet Commonly known as: SINGULAIR Take 1 tablet (10 mg total) by mouth at bedtime.  multivitamin with minerals Tabs tablet Take 1 tablet by mouth every other day.   Peak Flow Meter Devi 1 puff by Does not apply route in the morning and at bedtime.   potassium chloride 10 MEQ tablet Commonly known as: KLOR-CON Take 1 tablet (10 mEq total) by mouth daily. What changed: when to take this   propranolol 20 MG tablet Commonly known as: INDERAL Take 1 tablet (20 mg total) by mouth 3 (three) times daily. What changed: when to take this   sodium chloride 0.65 % nasal spray Commonly known as: OCEAN Place 1 spray into the nose every other day.    Stiolto Respimat 2.5-2.5 MCG/ACT Aers Generic drug: Tiotropium Bromide-Olodaterol Inhale 2 puffs into the lungs daily.   Systane 0.4-0.3 % Soln Generic drug: Polyethyl Glycol-Propyl Glycol Place 1 drop into both eyes daily as needed (dry/irritated eyes.).               Discharge Care Instructions  (From admission, onward)           Start     Ordered   02/18/23 0000  Discharge wound care:       Comments: Shower normal or sponge bath today. Normal bath in 10 days. Glue to stay on for 10-14 days. No bandage needed.   02/18/23 0756            Follow-up Information     Lakesia Dahle, De Blanch, MD Follow up on 03/14/2023.   Specialty: General Surgery Contact information: [address] [place] [address] [phone]                  The results of significant diagnostics from this hospitalization (including imaging, microbiology, ancillary and laboratory) are listed below for reference.    Significant Diagnostic Studies: No results found.  Labs: Basic Metabolic Panel: Recent Labs  Lab 02/17/23 1637 02/17/23 1912  NA 136  --   K 3.8  --   CL 103  --   CO2 24  --   GLUCOSE 115*  --   BUN 23  --   CREATININE 1.24* 1.20*  CALCIUM 8.7*  --    Liver Function Tests: Recent Labs  Lab 02/17/23 1637  AST 111*  ALT 59*  ALKPHOS 374*  BILITOT 1.3*  PROT 6.7  ALBUMIN 3.0*    CBC: Recent Labs  Lab 02/17/23 1912  WBC 8.0  HGB 11.0*  HCT 34.2*  MCV 96.6  PLT 175    CBG: No results for input(s): "GLUCAP" in the last 168 hours.  Principal Problem:   Right inguinal hernia Active Problems:   S/P inguinal hernia repair   Time coordinating discharge: 15 min

## 2023-02-18 NOTE — Plan of Care (Signed)

## 2023-02-18 NOTE — Discharge Instructions (Signed)
[facility] _______[facility]  UMBILICAL OR INGUINAL HERNIA REPAIR: POST OP INSTRUCTIONS  Always review your discharge instruction sheet given to you by the facility where your surgery was performed. IF YOU HAVE DISABILITY OR FAMILY LEAVE FORMS, YOU MUST BRING THEM TO THE OFFICE FOR PROCESSING.   DO NOT GIVE THEM TO YOUR DOCTOR.  1. A  prescription for pain medication may be given to you upon discharge.  Take your pain medication as prescribed, if needed.  If narcotic pain medicine is not needed, then you may take acetaminophen (Tylenol) or ibuprofen (Advil) as needed. 2. Take your usually prescribed medications unless otherwise directed. If you need a refill on your pain medication, please contact your pharmacy.  They will contact our office to request authorization. Prescriptions will not be filled after 5 pm or on week-ends. 3. You should follow a light diet the first 24 hours after arrival home, such as soup and crackers, etc.  Be sure to include fluids daily.  Resume your normal diet the day after surgery. 4.Most patients will experience some swelling and bruising around the umbilicus or in the groin.  Ice packs and reclining will help.  Swelling and bruising can take several days to resolve.  6. It is common to experience some constipation if taking pain medication after surgery.  Increasing fluid intake and taking a stool softener (such as Colace) will usually help or prevent this problem from occurring.  A mild laxative (Milk of Magnesia or Miralax) should be taken according to package directions if there are no bowel movements after 48 hours. 7. Your surgeon used skin glue on the incision, you may shower in 24 hours.  The glue will flake off over the next 2-3 weeks.  Any sutures or staples will be removed at the office during your follow-up visit. 8. ACTIVITIES:  You may resume regular (light) daily activities beginning the next day--such as daily self-care, walking, climbing  stairs--gradually increasing activities as tolerated.  You may have sexual intercourse when it is comfortable.  Refrain from any heavy lifting or straining until approved by your doctor.  a.You may drive when you are no longer taking prescription pain medication, you can comfortably wear a seatbelt, and you can safely maneuver your car and apply brakes.   9.You should see your doctor in the office for a follow-up appointment approximately 2-3 weeks after your surgery.  Make sure that you call for this appointment within a day or two after you arrive home to insure a convenient appointment time. 10.OTHER INSTRUCTIONS: _________________________    _____________________________________  WHEN TO CALL YOUR DOCTOR: Fever over 101.0 Inability to urinate Nausea and/or vomiting Extreme swelling or bruising Continued bleeding from incision. Increased pain, redness, or drainage from the incision  The clinic staff is available to answer your questions during regular business hours.  Please don't hesitate to call and ask to speak to one of the nurses for clinical concerns.  If you have a medical emergency, go to the nearest emergency room or call 911.  A surgeon from [facility] is always on call at the hospital   [address], [place], [place]  [address] ?  [address] [address], [place], [place]   [address] [phone] ? [phone] ? FAX [phone] Web site: [email or web address]

## 2023-02-19 DIAGNOSIS — K4091 Unilateral inguinal hernia, without obstruction or gangrene, recurrent: Secondary | ICD-10-CM | POA: Diagnosis not present

## 2023-02-19 NOTE — Plan of Care (Signed)

## 2023-02-19 NOTE — TOC Transition Note (Signed)
Transition of Care [facility]) - Discharge Note   Patient Details  Name: [name] [name] MRN: [id number] Date of Birth: [birth year]  [facility] CM/SW Contact:  Amada Jupiter, LCSW Phone Number: 02/19/2023, 11:25 AM   Clinical Narrative:    Met with pt yesterday with anticipation for dc today.  Orders placed for RW and HHPT follow up and pt agreeable and no agency preferences.  RW ordered via [employer] and has been delivered to room.  HHPT secured with [facility] and contact info placed on AVS.  No further TOC needs.   Final next level of care: Home w Home Health Services Barriers to Discharge: No Barriers Identified   Patient Goals and CMS Choice            Discharge Placement                       Discharge Plan and Services Additional resources added to the After Visit Summary for                  DME Arranged: Walker rolling DME Agency: [employer] Date DME Agency Contacted: 02/18/23 Time DME Agency Contacted: 1500 Representative spoke with at DME Agency: Ian Malkin HH Arranged: PT HH Agency: [facility] Date [facility] Agency Contacted: 02/19/23 Time HH Agency Contacted: 1125 Representative spoke with at [facility] Agency: Haywood Lasso  Social Drivers of Health (SDOH) Interventions SDOH Screenings   Food Insecurity: No Food Insecurity (02/17/2023)  Housing: Low Risk  (02/17/2023)  Transportation Needs: Unmet Transportation Needs (02/17/2023)  Utilities: Not At Risk (02/17/2023)  Depression (PHQ2-9): Low Risk  (06/06/2020)  Social Connections: Unknown (02/18/2023)  Tobacco Use: Low Risk  (02/17/2023)     Readmission Risk Interventions     No data to display

## 2023-02-19 NOTE — Progress Notes (Signed)
S: Patient unable to get a ride home yesterday, soreness over right groin and incisions, tolerating diet, +flatus O: BP (!) 103/59 (BP Location: Right Arm)   Pulse 80   Temp 98.6 F (37 C)   Resp 18   Ht 5' (1.524 m)   Wt 50.8 kg   SpO2 93%   BMI 21.87 kg/m  Gen: NAd Neuro: AOx4   A/P 89 yo female POD 2 lap R inguinal hernia repair with mesh and umbilical hernia repair -Patient was discharged yesterday but unable to go home due to family issues. -Hopefully, she will go home today -Patient is doing well and does not require 24 h monitoring

## 2023-02-19 NOTE — Plan of Care (Signed)
Problem: Activity: Goal: Risk for activity intolerance will decrease Outcome: Progressing   Problem: Nutrition: Goal: Adequate nutrition will be maintained Outcome: Completed/Met

## 2023-02-20 ENCOUNTER — Telehealth: Payer: Federal, State, Local not specified - PPO

## 2023-02-20 ENCOUNTER — Telehealth: Payer: Self-pay | Admitting: Cardiology

## 2023-02-20 NOTE — Telephone Encounter (Signed)
Yes please hold lasix for now until surgical and medical issues are cleared

## 2023-02-20 NOTE — Telephone Encounter (Signed)
Daughter called in because patient is dehydrated and she would like to speak to the nurse about it. Please advise

## 2023-02-20 NOTE — Telephone Encounter (Signed)
Called patient's daughter back about message. Patient was recently discharged from the hospital on 02/18/23 after having right inguinal hernia repair. Patient's vitals today BP 114/86, HR 75, O2 93-95 %, and has left lower lobe crackles. Patient has been having diarrhea and patient's daughter is very concerned. Patient is on lasix 20 mg daily. She also informed us that patient was in [place] several hours after surgery. Patient is currently taking propranolol BID, but is suppose to take TID, patient's daughter wanted to make sure this is okay. Patient's daughter stated she called surgeon's office and they want the patient to get a stool sample, possibly check for C Diff.   Advised patient's daughter to make sure patient keeps plenty of liquids, and she can try the [employer] Encouraged her to call patient's PCP for further advisement.   Will send to Dr. Jacinto Halim for advisement as well on lasix, should she hold for now until diarrhea clears up? V Tach, does she need a monitor? Propranolol, BID or TID?

## 2023-02-20 NOTE — Telephone Encounter (Signed)
Called pt daughter to advise of MD response:   Yes please hold lasix for now until surgical and medical issues are cleared   Daughter reports she is a NP.  She is concerned that pt does not see Surgeon until 2/21 and Pulmonology until 2/10.  Is pt to hold furosemdie until this time?  Also wants to know when pt should hold propranolol. I advised daughter that as of this moment if main concern is dehydration pt should not take Lasix.  Also advised if pt BP is low below 110 or if she feel weak not to give Propanolol until MD can give further guidance.  Will send to MD for further guidance.   At conclusion of call advised daughter to HOLD furosemide and to reach out to PCP for guidance regarding dehydration.

## 2023-02-21 NOTE — Telephone Encounter (Signed)
Patient's daughter notified.  Patient due for follow up with Dr Jacinto Halim.  Appointment scheduled for 2/14

## 2023-02-21 NOTE — Telephone Encounter (Signed)
Not to hold Propranolol unless SBP < 110 mm Hg systolic

## 2023-02-23 ENCOUNTER — Encounter (HOSPITAL_BASED_OUTPATIENT_CLINIC_OR_DEPARTMENT_OTHER): Payer: Self-pay

## 2023-02-23 ENCOUNTER — Other Ambulatory Visit: Payer: Self-pay | Admitting: Pulmonary Disease

## 2023-02-23 ENCOUNTER — Inpatient Hospital Stay (HOSPITAL_BASED_OUTPATIENT_CLINIC_OR_DEPARTMENT_OTHER)
Admission: EM | Admit: 2023-02-23 | Discharge: 2023-02-28 | DRG: 392 | Disposition: A | Discharge status: Home Health | Payer: Medicare Other | Attending: Internal Medicine | Admitting: Internal Medicine

## 2023-02-23 ENCOUNTER — Emergency Department (HOSPITAL_BASED_OUTPATIENT_CLINIC_OR_DEPARTMENT_OTHER): Payer: Medicare Other

## 2023-02-23 ENCOUNTER — Other Ambulatory Visit: Payer: Self-pay

## 2023-02-23 ENCOUNTER — Telehealth: Payer: Self-pay | Admitting: Internal Medicine

## 2023-02-23 DIAGNOSIS — Z825 Family history of asthma and other chronic lower respiratory diseases: Secondary | ICD-10-CM

## 2023-02-23 DIAGNOSIS — Z91030 Bee allergy status: Secondary | ICD-10-CM

## 2023-02-23 DIAGNOSIS — E876 Hypokalemia: Secondary | ICD-10-CM | POA: Diagnosis present

## 2023-02-23 DIAGNOSIS — Z952 Presence of prosthetic heart valve: Secondary | ICD-10-CM

## 2023-02-23 DIAGNOSIS — Z8249 Family history of ischemic heart disease and other diseases of the circulatory system: Secondary | ICD-10-CM

## 2023-02-23 DIAGNOSIS — J439 Emphysema, unspecified: Secondary | ICD-10-CM | POA: Diagnosis present

## 2023-02-23 DIAGNOSIS — K7460 Unspecified cirrhosis of liver: Secondary | ICD-10-CM | POA: Diagnosis present

## 2023-02-23 DIAGNOSIS — E785 Hyperlipidemia, unspecified: Secondary | ICD-10-CM | POA: Diagnosis present

## 2023-02-23 DIAGNOSIS — Z66 Do not resuscitate: Secondary | ICD-10-CM | POA: Diagnosis present

## 2023-02-23 DIAGNOSIS — Z9889 Other specified postprocedural states: Secondary | ICD-10-CM

## 2023-02-23 DIAGNOSIS — R197 Diarrhea, unspecified: Secondary | ICD-10-CM

## 2023-02-23 DIAGNOSIS — K219 Gastro-esophageal reflux disease without esophagitis: Secondary | ICD-10-CM | POA: Diagnosis present

## 2023-02-23 DIAGNOSIS — Z79890 Hormone replacement therapy (postmenopausal): Secondary | ICD-10-CM

## 2023-02-23 DIAGNOSIS — Z886 Allergy status to analgesic agent status: Secondary | ICD-10-CM

## 2023-02-23 DIAGNOSIS — M545 Low back pain, unspecified: Secondary | ICD-10-CM | POA: Diagnosis present

## 2023-02-23 DIAGNOSIS — E860 Dehydration: Secondary | ICD-10-CM | POA: Diagnosis present

## 2023-02-23 DIAGNOSIS — E440 Moderate protein-calorie malnutrition: Secondary | ICD-10-CM | POA: Diagnosis present

## 2023-02-23 DIAGNOSIS — Z79899 Other long term (current) drug therapy: Secondary | ICD-10-CM

## 2023-02-23 DIAGNOSIS — Z955 Presence of coronary angioplasty implant and graft: Secondary | ICD-10-CM

## 2023-02-23 DIAGNOSIS — I130 Hypertensive heart and chronic kidney disease with heart failure and stage 1 through stage 4 chronic kidney disease, or unspecified chronic kidney disease: Secondary | ICD-10-CM | POA: Diagnosis present

## 2023-02-23 DIAGNOSIS — Z8673 Personal history of transient ischemic attack (TIA), and cerebral infarction without residual deficits: Secondary | ICD-10-CM

## 2023-02-23 DIAGNOSIS — R5381 Other malaise: Secondary | ICD-10-CM | POA: Diagnosis present

## 2023-02-23 DIAGNOSIS — A084 Viral intestinal infection, unspecified: Secondary | ICD-10-CM | POA: Diagnosis not present

## 2023-02-23 DIAGNOSIS — Z1152 Encounter for screening for COVID-19: Secondary | ICD-10-CM

## 2023-02-23 DIAGNOSIS — Z7902 Long term (current) use of antithrombotics/antiplatelets: Secondary | ICD-10-CM

## 2023-02-23 DIAGNOSIS — N1832 Chronic kidney disease, stage 3b: Secondary | ICD-10-CM | POA: Diagnosis present

## 2023-02-23 DIAGNOSIS — Z808 Family history of malignant neoplasm of other organs or systems: Secondary | ICD-10-CM

## 2023-02-23 DIAGNOSIS — E039 Hypothyroidism, unspecified: Secondary | ICD-10-CM | POA: Diagnosis present

## 2023-02-23 DIAGNOSIS — E861 Hypovolemia: Secondary | ICD-10-CM | POA: Diagnosis present

## 2023-02-23 DIAGNOSIS — K6289 Other specified diseases of anus and rectum: Secondary | ICD-10-CM | POA: Diagnosis present

## 2023-02-23 DIAGNOSIS — Z881 Allergy status to other antibiotic agents status: Secondary | ICD-10-CM

## 2023-02-23 DIAGNOSIS — Z882 Allergy status to sulfonamides status: Secondary | ICD-10-CM

## 2023-02-23 DIAGNOSIS — Z803 Family history of malignant neoplasm of breast: Secondary | ICD-10-CM

## 2023-02-23 DIAGNOSIS — Z9049 Acquired absence of other specified parts of digestive tract: Secondary | ICD-10-CM

## 2023-02-23 DIAGNOSIS — K766 Portal hypertension: Secondary | ICD-10-CM | POA: Diagnosis present

## 2023-02-23 DIAGNOSIS — Z6821 Body mass index (BMI) 21.0-21.9, adult: Secondary | ICD-10-CM

## 2023-02-23 DIAGNOSIS — J9811 Atelectasis: Secondary | ICD-10-CM | POA: Diagnosis present

## 2023-02-23 DIAGNOSIS — E871 Hypo-osmolality and hyponatremia: Principal | ICD-10-CM | POA: Diagnosis present

## 2023-02-23 DIAGNOSIS — I5032 Chronic diastolic (congestive) heart failure: Secondary | ICD-10-CM | POA: Diagnosis present

## 2023-02-23 DIAGNOSIS — I350 Nonrheumatic aortic (valve) stenosis: Secondary | ICD-10-CM | POA: Diagnosis present

## 2023-02-23 DIAGNOSIS — Z831 Family history of other infectious and parasitic diseases: Secondary | ICD-10-CM

## 2023-02-23 DIAGNOSIS — I2510 Atherosclerotic heart disease of native coronary artery without angina pectoris: Secondary | ICD-10-CM | POA: Diagnosis present

## 2023-02-23 LAB — CBC WITH DIFFERENTIAL/PLATELET
Abs Immature Granulocytes: 0.06 10*3/uL (ref 0.00–0.07)
Basophils Absolute: 0 10*3/uL (ref 0.0–0.1)
Basophils Relative: 0 %
Eosinophils Absolute: 0.1 10*3/uL (ref 0.0–0.5)
Eosinophils Relative: 0 %
HCT: 33.8 % — ABNORMAL LOW (ref 36.0–46.0)
Hemoglobin: 11.8 g/dL — ABNORMAL LOW (ref 12.0–15.0)
Immature Granulocytes: 0 %
Lymphocytes Relative: 13 %
Lymphs Abs: 1.7 10*3/uL (ref 0.7–4.0)
MCH: 31.5 pg (ref 26.0–34.0)
MCHC: 34.9 g/dL (ref 30.0–36.0)
MCV: 90.1 fL (ref 80.0–100.0)
Monocytes Absolute: 1 10*3/uL (ref 0.1–1.0)
Monocytes Relative: 7 %
Neutro Abs: 10.6 10*3/uL — ABNORMAL HIGH (ref 1.7–7.7)
Neutrophils Relative %: 80 %
Platelets: 299 10*3/uL (ref 150–400)
RBC: 3.75 MIL/uL — ABNORMAL LOW (ref 3.87–5.11)
RDW: 13.8 % (ref 11.5–15.5)
WBC: 13.8 10*3/uL — ABNORMAL HIGH (ref 4.0–10.5)
nRBC: 0 % (ref 0.0–0.2)

## 2023-02-23 LAB — COMPREHENSIVE METABOLIC PANEL
ALT: 33 U/L (ref 0–44)
AST: 41 U/L (ref 15–41)
Albumin: 3.3 g/dL — ABNORMAL LOW (ref 3.5–5.0)
Alkaline Phosphatase: 315 U/L — ABNORMAL HIGH (ref 38–126)
Anion gap: 9 (ref 5–15)
BUN: 17 mg/dL (ref 8–23)
CO2: 23 mmol/L (ref 22–32)
Calcium: 8.6 mg/dL — ABNORMAL LOW (ref 8.9–10.3)
Chloride: 90 mmol/L — ABNORMAL LOW (ref 98–111)
Creatinine, Ser: 0.92 mg/dL (ref 0.44–1.00)
GFR, Estimated: 60 mL/min — ABNORMAL LOW (ref >60)
Glucose, Bld: 110 mg/dL — ABNORMAL HIGH (ref 70–99)
Potassium: 3.5 mmol/L (ref 3.5–5.1)
Sodium: 122 mmol/L — ABNORMAL LOW (ref 135–145)
Total Bilirubin: 1.2 mg/dL (ref 0.0–1.2)
Total Protein: 7 g/dL (ref 6.5–8.1)

## 2023-02-23 LAB — MAGNESIUM: Magnesium: 1.7 mg/dL (ref 1.7–2.4)

## 2023-02-23 LAB — RESP PANEL BY RT-PCR (RSV, FLU A&B, COVID)  RVPGX2
Influenza A by PCR: NEGATIVE
Influenza B by PCR: NEGATIVE
Resp Syncytial Virus by PCR: NEGATIVE
SARS Coronavirus 2 by RT PCR: NEGATIVE

## 2023-02-23 LAB — URINALYSIS, W/ REFLEX TO CULTURE (INFECTION SUSPECTED)
Bilirubin Urine: NEGATIVE
Glucose, UA: NEGATIVE mg/dL
Hgb urine dipstick: NEGATIVE
Nitrite: NEGATIVE
Specific Gravity, Urine: >1.046 — ABNORMAL HIGH (ref 1.005–1.030)
pH: 6.5 (ref 5.0–8.0)

## 2023-02-23 LAB — C DIFFICILE QUICK SCREEN W PCR REFLEX
C Diff antigen: NEGATIVE
C Diff interpretation: NOT DETECTED
C Diff toxin: NEGATIVE

## 2023-02-23 LAB — LACTIC ACID, PLASMA
Lactic Acid, Venous: 1.5 mmol/L (ref 0.5–1.9)
Lactic Acid, Venous: 1.5 mmol/L (ref 0.5–1.9)

## 2023-02-23 LAB — TROPONIN I (HIGH SENSITIVITY)
Troponin I (High Sensitivity): 8 ng/L (ref <18)
Troponin I (High Sensitivity): 9 ng/L (ref <18)

## 2023-02-23 LAB — LIPASE, BLOOD: Lipase: 13 U/L (ref 11–51)

## 2023-02-23 LAB — BRAIN NATRIURETIC PEPTIDE: B Natriuretic Peptide: 521.1 pg/mL — ABNORMAL HIGH (ref 0.0–100.0)

## 2023-02-23 MED ORDER — SODIUM CHLORIDE 0.9 % IV BOLUS
1000.0000 mL | Freq: Once | INTRAVENOUS | Status: AC
  Administered 2023-02-23: 1000 mL via INTRAVENOUS

## 2023-02-23 MED ORDER — ONDANSETRON HCL 4 MG/2ML IJ SOLN
4.0000 mg | Freq: Once | INTRAMUSCULAR | Status: AC
  Administered 2023-02-23: 4 mg via INTRAVENOUS
  Filled 2023-02-23: qty 2

## 2023-02-23 MED ORDER — SODIUM CHLORIDE 0.9 % IV SOLN: Freq: Once | INTRAVENOUS | Status: AC

## 2023-02-23 MED ORDER — DOXYCYCLINE HYCLATE 100 MG PO TABS
100.0000 mg | ORAL_TABLET | Freq: Once | ORAL | Status: AC
  Administered 2023-02-23: 100 mg via ORAL
  Filled 2023-02-23: qty 1

## 2023-02-23 MED ORDER — IOHEXOL 300 MG/ML  SOLN
100.0000 mL | Freq: Once | INTRAMUSCULAR | Status: AC | PRN
  Administered 2023-02-23: 85 mL via INTRAVENOUS

## 2023-02-23 MED ORDER — MORPHINE SULFATE (PF) 4 MG/ML IV SOLN
4.0000 mg | Freq: Once | INTRAVENOUS | Status: AC
  Administered 2023-02-23: 4 mg via INTRAVENOUS
  Filled 2023-02-23: qty 1

## 2023-02-23 NOTE — Telephone Encounter (Signed)
[name] [name] - 89 years [address] [place] [address]    CALL TAKEN by CCM ON CALL - daughter asking fo albuterol   has a past medical history of Anemia, Arthritis, Asthma, CAD (coronary artery disease), Cirrhosis (HCC), CKD (chronic kidney disease), Dyspnea, Emphysema lung (HCC), GERD (gastroesophageal reflux disease), History of hiatal hernia, History of transcatheter aortic valve replacement (TAVR) (01/26/2019), Hyperlipidemia, Hypertension, Hypothyroidism, Lipoma, Nasal polyps, Pneumonia, Pulmonary nodule, Samter's triad, Severe aortic stenosis, and TIA (transient ischemic attack).   has a past surgical history that includes Rotator cuff repair (09/27/2003); Cholecystectomy (02/27/04); Trigger finger release (10/29/06); Incontinence surgery (09/12/09); ORIF ankle fracture (Right, 09/11/2013); RIGHT/LEFT HEART CATH AND CORONARY ANGIOGRAPHY (N/A, 12/15/2018); CORONARY STENT INTERVENTION (N/A, 01/05/2019); CORONARY ATHERECTOMY (N/A, 01/05/2019); CORONARY BALLOON ANGIOPLASTY (N/A, 01/05/2019); CORONARY STENT INTERVENTION (01/05/2019); Nasal sinus surgery; Transcatheter aortic valve replacement, transfemoral (01/26/2019); Transcatheter aortic valve replacement, transfemoral (N/A, 01/26/2019); Sinus endo with fusion (Bilateral, 09/08/2020); Inguinal hernia repair (Right, 02/17/2023); and Umbilical hernia repair (N/A, 02/17/2023).    02/17/23 Sp inguinal hernia surgery - ght inguinal hernia, 2 cm supraumbilical incisional hernia   Procedure: laparoscopic TAP Right inguinal hernia repair with mesh, open primary 2 cm incisional hernia repair  Discharge 02/18/23  Within 5h of going home - severe diarrha startin 7pm  02/19/23 Daughter called surgery 02/19/23- c/o dehyration and diminished po and was advised to hold lasix/bp meds if SBP < 90 Daighter then called PCP 02/19/23 - pedialyte Rx Diarrhe aimproved and started BRAT Diet  02/20/23 - Dr Jacinto Halim advised to hold lasix but not to hold propranaol  unless SBP < 1110   TODAY 02/23/23 - Abd pain and back pain and side pain x 2 days -> she said she called 3 surgeons over last few days but she is worried they are not concerned.  She has doubled hydrocodone and doing ice pack  - Abd swollen in front this morning and pressing on diaphragm - GETTING WORSE - Patient c/o dyspnea NEW  - orthopnea +  - improved by sitting up - ALSO DIARRHEA WORSE  - Daughter ausculatated and feels LLL dimished BREATH SOUND (no crackles) - RR and pulse ox normal - Albuterol has not helped - Daughter calling because albuerol expired and wanting Alb called     ASSESSMENT  -Multiple Medical Issues - complex - do not think albuterol wil helop -concern for electrolte imbalance and peritoniitis   PLAN - advised to go  To ER -> initially dughate reluctant - > later agreed to go to [facility] ER      SIGNATURE    Dr. Kalman Shan, M.D., F.C.C.P,  Pulmonary and Critical Care Medicine Staff Physician, [place] [facility] Center Director - Interstitial Lung Disease  Program  Pulmonary Fibrosis [facility] at [facility], [place], [address]   Pager: [phone], If no answer  -> Check AMION or Try [phone] Telephone (clinical office): [phone] Telephone (research): [phone]  11:14 AM 02/23/2023

## 2023-02-23 NOTE — ED Provider Notes (Signed)
  Physical Exam  BP 127/71 (BP Location: Right Arm)   Pulse 83   Resp 17   SpO2 98%   Physical Exam  Procedures  .Critical Care  Performed by: Arletha Pili, DO Authorized by: Arletha Pili, DO   Critical care provider statement:    Critical care time (minutes):  30   Critical care was necessary to treat or prevent imminent or life-threatening deterioration of the following conditions:  Metabolic crisis   Critical care was time spent personally by me on the following activities:  Development of treatment plan with patient or surrogate, discussions with consultants, evaluation of patient's response to treatment, examination of patient, ordering and review of laboratory studies, ordering and review of radiographic studies, ordering and performing treatments and interventions, pulse oximetry, re-evaluation of patient's condition and review of old charts   ED Course / MDM    Medical Decision Making Amount and/or Complexity of Data Reviewed Labs: ordered. Radiology: ordered.  Risk Prescription drug management.   I, Rosana Berger, have a assumed care for this patient.  In brief this is an 89 year old female presenting to the emergency department today due to profuse diarrhea over the last several days.  Patient had hernia repair done last week.  Patient was signed out pending imaging, labs.  Patient with sodium of 122.  She is not showing any evidence of altered mental status or seizure activity.  Creatinine at baseline.  Does have leukocytosis.  CT imaging shows proctitis, question of superimposed pneumonia.  Viral GI losses versus C. difficile.  C. difficile study still pending.  Regarding the patient's pneumonia, she has felt short of breath and has a few days outside of surgery.  Could certainly be pneumonia.  Patient with multiple allergies, will treat with p.o. doxycycline.  Do not believe patient's symptoms are related to her surgical procedure at this time as her incisions  look well, is no sign of developing abscess within the abdomen.  Will admit patient to hospitalist for diarrhea, hyponatremia.    Anders Simmonds T, DO 02/23/23 1655

## 2023-02-23 NOTE — ED Triage Notes (Signed)
She reports several episodes of diarrhea and some shortness of breath since recently had herniorrhaphy operation. She tells [place] she has known diaphragmatic hernia and her daughter states "Her stomach is in her chest". She is in no distress. She also mentions some issues with shortness of breath and tells me she has a hx of COPD.

## 2023-02-23 NOTE — ED Provider Notes (Incomplete)
Emergency Department Provider Note   I have reviewed the triage vital signs and the nursing notes.   HISTORY  Chief Complaint Abdominal Pain   HPI [name] [name] is a 89 y.o. female with past history reviewed below including CAD, CKD, hypertension, hyperlipidemia presents to the emergency department with intractable diarrhea which has been frequent and large-volume.  She has associated epigastric abdominal pain and distention.  Of note, she had an inguinal and periumbilical hernia repair with  surgery on 1/28.  She felt well after surgery and went home but unfortunately the above symptoms developed shortly after returning home.  The daughter states that there was a caregiver in the home with vomiting and diarrheal type illness before surgery.  Patient has not seen any blood or black in the stool.  The daughter is trying to keep her hydrated and control her pain with hydrocodone with no relief.  Patient is also describing some shortness of breath symptoms.  She does have COPD but denies any chest pain or severe cough.  Has a known, large diaphragmatic hernia.   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    Hyperlipidemia    Hypertension    Hypothyroidism    Lipoma    left arm   Nasal polyps    Pneumonia    Pulmonary nodule    noted on pre TAVR CT   Samter's triad    Severe aortic stenosis    TIA (transient ischemic attack)     Review of Systems  Constitutional: No fever/chills. Positive generalized weakness.  Cardiovascular: Denies chest pain. Respiratory:  Positive shortness of breath. Gastrointestinal: Positive abdominal pain.  No nausea, no vomiting. Profuse, watery diarrhea.  Musculoskeletal: Positive for back pain. Skin: Negative for rash. Neurological: Negative for headaches.  ____________________________________________   PHYSICAL EXAM:  VITAL SIGNS: ED Triage Vitals  Encounter Vitals Group     BP --      Systolic BP Percentile --      Diastolic BP Percentile --      Pulse Rate 02/23/23 1258 82     Resp 02/23/23 1258 18     Temp --      Temp src --      SpO2 02/23/23 1258 98 %     Weight --      Height --      Head Circumference --      Peak Flow --      Pain Score 02/23/23 1239 4     Pain Loc --      Pain Education --      Exclude from Growth Chart --    Constitutional: Alert and oriented. Patient appears uncomfortable but able to participate with exam.  Eyes: Conjunctivae are normal. Head: Atraumatic. Nose: No congestion/rhinnorhea. Mouth/Throat: Mucous membranes are dry.  Neck: No stridor. Cardiovascular: Normal rate, regular rhythm. Good peripheral circulation. Grossly normal heart sounds.   Respiratory: Normal respiratory effort.  No retractions. Lungs CTAB. Gastrointestinal: Soft with mild  epigastric tenderness. No peritonitis. Mild distension. Incisions are well-appearing. No distention.  Musculoskeletal: No gross deformities of extremities. Neurologic:  Normal speech and language.  Skin:  Skin is warm, dry and intact. No rash noted.  ____________________________________________   LABS (all labs ordered are listed, but only abnormal results are displayed)  Labs Reviewed  RESP PANEL BY RT-PCR (RSV, FLU A&B, COVID)  [id number]  C DIFFICILE QUICK SCREEN W PCR REFLEX    GASTROINTESTINAL PANEL BY PCR, STOOL (REPLACES STOOL CULTURE)  COMPREHENSIVE METABOLIC PANEL  LACTIC ACID, PLASMA  LACTIC ACID, PLASMA  LIPASE, BLOOD  BRAIN NATRIURETIC PEPTIDE  CBC WITH DIFFERENTIAL/PLATELET  URINALYSIS, W/ REFLEX TO  CULTURE (INFECTION SUSPECTED)  MAGNESIUM  TROPONIN I (HIGH SENSITIVITY)   ____________________________________________  EKG   EKG Interpretation Date/Time:    Ventricular Rate:    PR Interval:    QRS Duration:    QT Interval:    QTC Calculation:   R Axis:      Text Interpretation:          ____________________________________________  RADIOLOGY  DG Chest Portable 1 View Result Date: 02/23/2023 CLINICAL DATA:  Shortness of breath and back pain. EXAM: PORTABLE CHEST 1 VIEW COMPARISON:  10/22/2021 FINDINGS: Stable cardiomediastinal contours. Status post TAVR. Aortic atherosclerosis. Large hiatal hernia is again noted and obscures the retrocardiac portions of the left lower lung. No pleural effusion, interstitial edema or airspace consolidation. Mild curvature of the lower thoracic spine is convex towards the right. IMPRESSION: 1. No acute cardiopulmonary disease. 2. Large hiatal hernia. Electronically Signed   By: Signa Kell M.D.   On: 02/23/2023 13:37    ____________________________________________   PROCEDURES  Procedure(s) performed:   Procedures   ____________________________________________   INITIAL IMPRESSION / ASSESSMENT AND PLAN / ED COURSE  Pertinent labs & imaging results that were available during my care of the patient were reviewed by me and considered in my medical decision making (see chart for details).   This patient is Presenting for Evaluation of abdominal pain, which does require a range of treatment options, and is a complaint that involves a high risk of morbidity and mortality.  The Differential Diagnoses includes but is not exclusive to acute cholecystitis, intrathoracic causes for epigastric abdominal pain, gastritis, duodenitis, pancreatitis, small bowel or large bowel obstruction, abdominal aortic aneurysm, hernia, gastritis, etc.   Critical Interventions-    Medications  sodium chloride 0.9 % bolus 1,000 mL (has no administration in  time range)  morphine (PF) 4 MG/ML injection 4 mg (has no administration in time range)  ondansetron (ZOFRAN) injection 4 mg (has no administration in time range)    Reassessment after intervention:     I did obtain Additional Historical Information from daughter at bedside.  I decided to review pertinent External Data, and in summary patient with uncomplicated right inguinal hernia repair with open incisional supraumbilical hernia repair.    Clinical Laboratory Tests Ordered, included ***  Radiologic Tests Ordered, included CXR. I independently interpreted the images and agree with radiology interpretation.   Cardiac Monitor Tracing which shows NSR.    Social Determinants of Health Risk patient is a non-smoker.   Consult complete with  Medical Decision Making: Summary:  Patient presents to the emergency department for evaluation of mainly abdominal pain and profuse watery diarrhea.  She has some distention and mild tenderness in her abdomen.  Lower suspicion for postoperative complication.  Incisions look well.  GI presentation could be primarily viral.  She did have close contact in her  home with viral GI symptoms prior to surgery.  Plan for chest x-ray and respiratory panel with some reported shortness of breath CT in the postop setting, reassess after fluids and pain management.  Reevaluation with update and discussion with   ***Considered admission***  Patient's presentation is most consistent with acute presentation with potential threat to life or bodily function.   Disposition:   ____________________________________________  FINAL CLINICAL IMPRESSION(S) / ED DIAGNOSES  Final diagnoses:  None     NEW OUTPATIENT MEDICATIONS STARTED DURING THIS VISIT:  New Prescriptions   No medications on file    Note:  This document was prepared using Dragon voice recognition software and may include unintentional dictation errors.  Alona Bene, MD, [facility]  Medicine

## 2023-02-23 NOTE — ED Notes (Signed)
 Report given to the next RN.Marland KitchenMarland Kitchen

## 2023-02-23 NOTE — ED Notes (Signed)
Lab contacted about running the BMP off the recent little Green tube... They confirmed they had the tube and were going to run the lab.

## 2023-02-24 ENCOUNTER — Telehealth: Payer: Self-pay | Admitting: Pulmonary Disease

## 2023-02-24 ENCOUNTER — Encounter: Payer: Self-pay | Admitting: *Deleted

## 2023-02-24 DIAGNOSIS — E871 Hypo-osmolality and hyponatremia: Secondary | ICD-10-CM | POA: Diagnosis not present

## 2023-02-24 LAB — BASIC METABOLIC PANEL
Anion gap: 8 (ref 5–15)
Anion gap: 10 (ref 5–15)
BUN: 18 mg/dL (ref 8–23)
BUN: 17 mg/dL (ref 8–23)
CO2: 21 mmol/L — ABNORMAL LOW (ref 22–32)
CO2: 20 mmol/L — ABNORMAL LOW (ref 22–32)
Calcium: 7.7 mg/dL — ABNORMAL LOW (ref 8.9–10.3)
Calcium: 8.1 mg/dL — ABNORMAL LOW (ref 8.9–10.3)
Chloride: 93 mmol/L — ABNORMAL LOW (ref 98–111)
Chloride: 95 mmol/L — ABNORMAL LOW (ref 98–111)
Creatinine, Ser: 0.89 mg/dL (ref 0.44–1.00)
Creatinine, Ser: 0.89 mg/dL (ref 0.44–1.00)
GFR, Estimated: >60 mL/min (ref >60)
GFR, Estimated: >60 mL/min (ref >60)
Glucose, Bld: 101 mg/dL — ABNORMAL HIGH (ref 70–99)
Glucose, Bld: 97 mg/dL (ref 70–99)
Potassium: 3.4 mmol/L — ABNORMAL LOW (ref 3.5–5.1)
Potassium: 3.6 mmol/L (ref 3.5–5.1)
Sodium: 122 mmol/L — ABNORMAL LOW (ref 135–145)
Sodium: 125 mmol/L — ABNORMAL LOW (ref 135–145)

## 2023-02-24 LAB — LACTATE DEHYDROGENASE: LDH: 131 U/L (ref 98–192)

## 2023-02-24 LAB — OSMOLALITY: Osmolality: 272 mosm/kg — ABNORMAL LOW (ref 275–295)

## 2023-02-24 LAB — NA AND K (SODIUM & POTASSIUM), RAND UR
Potassium Urine: 35 mmol/L
Sodium, Ur: 39 mmol/L

## 2023-02-24 LAB — OSMOLALITY, URINE: Osmolality, Ur: 432 mosm/kg (ref 300–900)

## 2023-02-24 LAB — TSH: TSH: 5.712 u[IU]/mL — ABNORMAL HIGH (ref 0.350–4.500)

## 2023-02-24 MED ORDER — ONDANSETRON HCL 4 MG/2ML IJ SOLN
4.0000 mg | Freq: Once | INTRAMUSCULAR | Status: AC
  Administered 2023-02-24: 4 mg via INTRAVENOUS
  Filled 2023-02-24: qty 2

## 2023-02-24 MED ORDER — LOPERAMIDE HCL 2 MG PO CAPS
2.0000 mg | ORAL_CAPSULE | Freq: Two times a day (BID) | ORAL | Status: DC
  Administered 2023-02-24: 2 mg via ORAL
  Filled 2023-02-24 (×2): qty 1

## 2023-02-24 MED ORDER — MORPHINE SULFATE (PF) 2 MG/ML IV SOLN
2.0000 mg | Freq: Once | INTRAVENOUS | Status: AC
  Administered 2023-02-24: 2 mg via INTRAVENOUS
  Filled 2023-02-24: qty 1

## 2023-02-24 MED ORDER — CLOPIDOGREL BISULFATE 75 MG PO TABS: 75.0000 mg | ORAL_TABLET | Freq: Every day | ORAL | Status: DC

## 2023-02-24 MED ORDER — SODIUM CHLORIDE 0.9 % IV BOLUS
500.0000 mL | Freq: Once | INTRAVENOUS | Status: AC
  Administered 2023-02-24: 500 mL via INTRAVENOUS

## 2023-02-24 MED ORDER — HYDRALAZINE HCL 20 MG/ML IJ SOLN: 10.0000 mg | Freq: Four times a day (QID) | INTRAMUSCULAR | Status: DC | PRN

## 2023-02-24 MED ORDER — MONTELUKAST SODIUM 10 MG PO TABS
10.0000 mg | ORAL_TABLET | Freq: Every day | ORAL | Status: DC
  Administered 2023-02-24 – 2023-02-26 (×3): 10 mg via ORAL
  Filled 2023-02-24 (×4): qty 1

## 2023-02-24 MED ORDER — ALBUTEROL SULFATE HFA 108 (90 BASE) MCG/ACT IN AERS: 2.0000 | INHALATION_SPRAY | Freq: Four times a day (QID) | RESPIRATORY_TRACT | 6 refills | Status: AC | PRN

## 2023-02-24 MED ORDER — CLOPIDOGREL BISULFATE 75 MG PO TABS
75.0000 mg | ORAL_TABLET | Freq: Every day | ORAL | Status: DC
  Administered 2023-02-25 – 2023-02-28 (×4): 75 mg via ORAL
  Filled 2023-02-24 (×5): qty 1

## 2023-02-24 MED ORDER — MORPHINE SULFATE (PF) 2 MG/ML IV SOLN
1.0000 mg | INTRAVENOUS | Status: DC | PRN
  Administered 2023-02-24: 1 mg via INTRAVENOUS
  Filled 2023-02-24: qty 1

## 2023-02-24 MED ORDER — PANTOPRAZOLE SODIUM 40 MG IV SOLR
40.0000 mg | Freq: Every day | INTRAVENOUS | Status: DC
  Administered 2023-02-24 – 2023-02-26 (×2): 40 mg via INTRAVENOUS
  Filled 2023-02-24 (×2): qty 10

## 2023-02-24 MED ORDER — CALCIUM CARBONATE ANTACID 500 MG PO CHEW
400.0000 mg | CHEWABLE_TABLET | Freq: Once | ORAL | Status: AC
  Administered 2023-02-24: 400 mg via ORAL
  Filled 2023-02-24: qty 2

## 2023-02-24 MED ORDER — DEXTROSE-SODIUM CHLORIDE 5-0.9 % IV SOLN: INTRAVENOUS | Status: DC

## 2023-02-24 MED ORDER — OXYCODONE HCL 5 MG PO TABS
5.0000 mg | ORAL_TABLET | ORAL | Status: DC | PRN
Start: 2023-02-24 — End: 2023-02-28
  Administered 2023-02-25 – 2023-02-28 (×6): 5 mg via ORAL
  Filled 2023-02-24 (×6): qty 1

## 2023-02-24 MED ORDER — ALBUTEROL SULFATE (2.5 MG/3ML) 0.083% IN NEBU
2.5000 mg | INHALATION_SOLUTION | Freq: Four times a day (QID) | RESPIRATORY_TRACT | Status: DC | PRN
Start: 2023-02-24 — End: 2023-02-25
  Administered 2023-02-25: 2.5 mg via RESPIRATORY_TRACT

## 2023-02-24 MED ORDER — POTASSIUM CHLORIDE CRYS ER 20 MEQ PO TBCR
40.0000 meq | EXTENDED_RELEASE_TABLET | Freq: Once | ORAL | Status: AC
  Administered 2023-02-24: 40 meq via ORAL
  Filled 2023-02-24: qty 2

## 2023-02-24 MED ORDER — LEVOTHYROXINE SODIUM 50 MCG PO TABS
50.0000 ug | ORAL_TABLET | Freq: Every day | ORAL | Status: DC
Start: 2023-02-24 — End: 2023-02-28
  Administered 2023-02-26 – 2023-02-28 (×3): 50 ug via ORAL
  Filled 2023-02-24 (×4): qty 1

## 2023-02-24 MED ORDER — LOPERAMIDE HCL 2 MG PO CAPS
2.0000 mg | ORAL_CAPSULE | Freq: Once | ORAL | Status: AC
  Administered 2023-02-24: 2 mg via ORAL
  Filled 2023-02-24: qty 1

## 2023-02-24 MED ORDER — ENOXAPARIN SODIUM 40 MG/0.4ML IJ SOSY
40.0000 mg | PREFILLED_SYRINGE | INTRAMUSCULAR | Status: DC
  Administered 2023-02-24 – 2023-02-28 (×5): 40 mg via SUBCUTANEOUS
  Filled 2023-02-24 (×5): qty 0.4

## 2023-02-24 MED ORDER — UMECLIDINIUM BROMIDE 62.5 MCG/ACT IN AEPB
1.0000 | INHALATION_SPRAY | Freq: Every day | RESPIRATORY_TRACT | Status: DC
  Administered 2023-02-26 – 2023-02-28 (×3): 1 via RESPIRATORY_TRACT
  Filled 2023-02-24 (×2): qty 7

## 2023-02-24 MED ORDER — ACETAMINOPHEN 325 MG PO TABS
650.0000 mg | ORAL_TABLET | Freq: Four times a day (QID) | ORAL | Status: DC | PRN
  Administered 2023-02-25 – 2023-02-27 (×4): 650 mg via ORAL
  Filled 2023-02-24 (×4): qty 2

## 2023-02-24 MED ORDER — ARFORMOTEROL TARTRATE 15 MCG/2ML IN NEBU
15.0000 ug | INHALATION_SOLUTION | Freq: Two times a day (BID) | RESPIRATORY_TRACT | Status: DC
  Administered 2023-02-24 – 2023-02-28 (×8): 15 ug via RESPIRATORY_TRACT
  Filled 2023-02-24 (×8): qty 2

## 2023-02-24 MED ORDER — ACETAMINOPHEN 650 MG RE SUPP: 650.0000 mg | Freq: Four times a day (QID) | RECTAL | Status: DC | PRN

## 2023-02-24 MED ORDER — ALUM & MAG HYDROXIDE-SIMETH 200-200-20 MG/5ML PO SUSP
15.0000 mL | Freq: Three times a day (TID) | ORAL | Status: DC
  Administered 2023-02-24 – 2023-02-28 (×15): 15 mL via ORAL
  Filled 2023-02-24 (×15): qty 30

## 2023-02-24 NOTE — Telephone Encounter (Signed)
PT called ans service and stated the following:  Day 5 post op breathing. Doesn't have any Albuterol Pharm is [phone]

## 2023-02-24 NOTE — ED Notes (Signed)
Pt resting with eyes closed, resp even and unlabored 

## 2023-02-24 NOTE — ED Provider Notes (Signed)
0100 AM  -Patient boarding for admission.  Initially came in for diarrhea, found of hyponatremia.  CT with possible pneumonia, C. difficile study pending. -Repeat BMP unchanged sodium.  Will give another bolus of NS and recheck. Replace potassium and calcium. Check urine lytes and osm/ldh/tsh -Her mental status is appropriate, no vomiting, no seizure.     0252 AM  -CDIFF was negative, still having some diarrhea, will give imodium 2mg  x1     0700AM  -Rpt BMP with Na @ 125. K normalized -Diarrhea improved w/ imodium -Urine lytes pending    CRITICAL CARE Performed by: Sloan Leiter   Total critical care time: 30 minutes  Critical care time was exclusive of separately billable procedures and treating other patients.  Critical care was necessary to treat or prevent imminent or life-threatening deterioration.  Critical care was time spent personally by me on the following activities: development of treatment plan with patient and/or surrogate as well as nursing, discussions with consultants, evaluation of patient's response to treatment, examination of patient, obtaining history from patient or surrogate, ordering and performing treatments and interventions, ordering and review of laboratory studies, ordering and review of radiographic studies, pulse oximetry and re-evaluation of patient's condition. hyponatremia    Sloan Leiter, DO 02/24/23 [phone]

## 2023-02-24 NOTE — ED Notes (Signed)
[place] with  cl called for transport 

## 2023-02-24 NOTE — ED Notes (Signed)
Found pt credit card in the cafe area. Returned to pt and placed in her phone case

## 2023-02-24 NOTE — ED Notes (Signed)
Urine sample not collected due to inability of pt to urinate without diarrhea at this time.

## 2023-02-24 NOTE — Telephone Encounter (Signed)
Albuterol has been sent to preferred pharmacy.  Lm for patient.

## 2023-02-24 NOTE — H&P (Addendum)
[facility] History and Physical  [name] [name] EXB:[id number] DOB: October 11, 1931 DOA: 02/23/2023 PCP: Ollen Bowl, MD  Presented from: Home Chief Complaint: Diarrhea, dehydration  History of Present Illness: [name] [name] is a 89 y.o. female with PMH significant for HTN, HLD, CAD, severe aortic stenosis, TIA, CKD, COPD, liver cirrhosis, hypothyroidism, GERD. 1/27, patient underwent laparoscopic right inguinal hernia repair with mesh and umbilical hernia repair by Dr. Sheliah Hatch.  Perioperatively, patient received clindamycin, did not require any antibiotics at discharge. 2/2, patient presented to the ED at [facility] with complaint of several episodes of diarrhea, nausea, poor oral intake, progressive generalized weakness.  She states symptoms started since the surgery on 1/27.  In the ED, patient was afebrile, heart rate in 80s, blood pressure 150s, breathing on room air. Initial labs with sodium low at 122, BUN/creatinine normal, BNP at 521, lactic acid normal, WBC count 13.8 Urinalysis showed clear yellow urine with negative nitrite, few bacteria. Stool studies negative for C. difficile.  GI pathogen panel pending CT chest abdomen pelvis -Air levels in the distal colon favoring diarrhea -Wall thickening and mucosal enhancement in the rectum with perirectal edema favoring proctitis -New small left pleural effusion, increased atelectasis in the left lower lobe -Right upper lobe reticular nodular opacity -Liver cirrhosis  Patient was given IV fluid. Repeat sodium level was still low at 122 in 12 hours. Accepted for direct admission to [facility] under [facility].  I saw the patient after she arrived to [facility] this afternoon. Chart reviewed Hemodynamically stable. Repeat labs this morning with sodium better at 125.  At the time of my evaluation, patient was propped up in bed.  Not in distress.  Felt nauseous.  Had 5 episodes of loose bowel movement since arrival to the ED  yesterday.  No vomiting.  Has not really tried to eat anything Patient received a dose of Imodium earlier this morning.  Review of Systems:  All systems were reviewed and were negative unless otherwise mentioned in the HPI   Past medical history: Past Medical History:  Diagnosis Date   Anemia    Arthritis    Asthma    history of   CAD (coronary artery disease)    Cirrhosis (HCC)    CKD (chronic kidney disease)    Dyspnea    sometimes sitting.lying and on exertion   Emphysema lung (HCC)    GERD (gastroesophageal reflux disease)    History of hiatal hernia    History of transcatheter aortic valve replacement (TAVR) 01/26/2019   (TAVR  23 mm Evolute Pro in aortic position 01/26/2019) .    Hyperlipidemia    Hypertension    Hypothyroidism    Lipoma    left arm   Nasal polyps    Pneumonia    Pulmonary nodule    noted on pre TAVR CT   Samter's triad    Severe aortic stenosis    TIA (transient ischemic attack)     Past surgical history: Past Surgical History:  Procedure Laterality Date   CHOLECYSTECTOMY  02/27/2004   CORONARY ATHERECTOMY N/A 01/05/2019   Procedure: CORONARY ATHERECTOMY;  Surgeon: Yates Decamp, MD;  Location: [facility];  Service: Cardiovascular;  Laterality: N/A;   CORONARY BALLOON ANGIOPLASTY N/A 01/05/2019   Procedure: CORONARY BALLOON ANGIOPLASTY;  Surgeon: Yates Decamp, MD;  Location: [facility];  Service: Cardiovascular;  Laterality: N/A;   CORONARY STENT INTERVENTION N/A 01/05/2019   Procedure: CORONARY STENT INTERVENTION;  Surgeon: Yates Decamp, MD;  Location: [facility];  Service: Cardiovascular;  Laterality: N/A;   CORONARY STENT INTERVENTION  01/05/2019   INCONTINENCE SURGERY  09/12/2009   INGUINAL HERNIA REPAIR Right 02/17/2023   Procedure: LAPAROSCOPIC RIGHT INGUINAL HERNIA REPAIR WITH MESH;  Surgeon: Kinsinger, De Blanch, MD;  Location: [facility] OR[facility];  Service: General;  Laterality: Right;   NASAL SINUS SURGERY     several      ORIF ANKLE FRACTURE Right 09/11/2013   Procedure: OPEN REDUCTION INTERNAL FIXATION (ORIF) ANKLE FRACTURE;  Surgeon: Verlee Rossetti, MD;  Location: [facility];  Service: Orthopedics;  Laterality: Right;   RIGHT/LEFT HEART CATH AND CORONARY ANGIOGRAPHY N/A 12/15/2018   Procedure: RIGHT/LEFT HEART CATH AND CORONARY ANGIOGRAPHY;  Surgeon: Yates Decamp, MD;  Location: [facility];  Service: Cardiovascular;  Laterality: N/A;   ROTATOR CUFF REPAIR  09/27/2003   SINUS ENDO WITH FUSION Bilateral 09/08/2020   Procedure: Bilateral Revision of Endoscopic Sinus Surgery with Fusion and Intranasal Polypectomy;  Surgeon: Osborn Coho, MD;  Location: [facility] OR;  Service: ENT;  Laterality: Bilateral;   TRANSCATHETER AORTIC VALVE REPLACEMENT, TRANSFEMORAL  01/26/2019   TRANSCATHETER AORTIC VALVE REPLACEMENT, TRANSFEMORAL N/A 01/26/2019   Procedure: TRANSCATHETER AORTIC VALVE REPLACEMENT, TRANSFEMORAL;  Surgeon: Kathleene Hazel, MD;  Location: [facility] OR;  Service: Open Heart Surgery;  Laterality: N/A;   TRIGGER FINGER RELEASE  10/29/2006   UMBILICAL HERNIA REPAIR N/A 02/17/2023   Procedure: PRIMARY UMBILICAL HERNIA REPAIR;  Surgeon: Sheliah Hatch, De Blanch, MD;  Location: [facility];  Service: General;  Laterality: N/A;    Social History:  reports that she has never smoked. She has never used smokeless tobacco. She reports that she does not drink alcohol and does not use drugs.  Allergies:  Allergies  Allergen Reactions   Aspirin Anaphylaxis   Bee Venom Anaphylaxis   Ceclor [Cefaclor] Anaphylaxis   Simvastatin Other (See Comments)    Hair loss   Pneumococcal Vac Polyvalent Swelling    Other reaction(s): swelling and erythema   Statins Other (See Comments)    Elevated liver enzymes   Crestor [Rosuvastatin] Rash   Sulfa Antibiotics Rash   Aspirin, Bee venom, Ceclor [cefaclor], Simvastatin, Pneumococcal vac polyvalent, Statins, Crestor [rosuvastatin], and Sulfa antibiotics   Family history:  Family  History  Problem Relation Age of Onset   Tuberculosis Mother    Other Father        Bronchiectasis   Sarcoidosis Sister    Dementia Sister    Heart disease Brother    Heart attack Brother    Cancer Maternal Grandmother        ovarian   Asthma Maternal Grandfather    Breast cancer Daughter    Brain cancer Daughter      Physical Exam: Vitals:   02/24/23 0630 02/24/23 1045 02/24/23 1112 02/24/23 1409  BP: 124/73 120/71  132/70  Pulse: 87 88    Resp: 17 13  16   Temp:  97.7 F (36.5 C) 98.2 F (36.8 C) 97.8 F (36.6 C)  TempSrc:  Oral Oral Oral  SpO2: 97% 100%     Wt Readings from Last 3 Encounters:  02/17/23 50.8 kg  02/13/23 50.8 kg  11/28/22 51 kg   There is no height or weight on file to calculate BMI.  General exam: Pleasant, elderly Caucasian female Skin: No rashes, lesions or ulcers. HEENT: Atraumatic, normocephalic, no obvious bleeding Lungs: Clear to auscultation bilaterally,  CVS: S1, S2, systolic ejection murmur noted GI/Abd: Soft, nontender, nondistended, bowel sound present, scars from  the recent laparoscopic surgery looks well-healing CNS: Alert, awake, oriented x 3 Psychiatry: Mood appropriate,  Extremities: No pedal edema, no calf tenderness,    ----------------------------------------------------------------------------------------------------------------------------------------- ----------------------------------------------------------------------------------------------------------------------------------------- -----------------------------------------------------------------------------------------------------------------------------------------  Assessment/Plan: Principal Problem:   Hyponatremia  Acute diarrhea Unclear etiology.  Patient states her symptoms started after the surgery but I do not see any clear correlation. C. difficile negative.  GI pathogen panel pending No fever.  WBC count was initially normal.  Lactic acid level  normal. Likely viral, less likely to be bacterial etiology. Was not started on antibiotics in the ED.  I would hold off antibiotics for now Continue hydration with D5 NS at 75 mL/h Symptomatic management with IV Zofran, Imodium twice daily scheduled for 2 days (PRN may not be used as intended) Flexi-Seal may help Recent Labs  Lab 02/17/23 1912 02/23/23 1357 02/23/23 1715  WBC 8.0 13.8*  --   LATICACIDVEN  --  1.5 1.5   Hyponatremia Likely hypovolemic in nature due to diarrhea loss, poor intake Serum osmolality low.  Urine pending. Gradually improving sodium level with IV hydration. Continue to monitor Recent Labs  Lab 02/17/23 1637 02/23/23 1357 02/24/23 0052 02/24/23 0637  NA 136 122* 122* 125*   Hypertension Portal hypertension with cirrhosis PTA meds-propranolol 20 mg twice daily, Lasix 20 mg MWF.  Definitely hold Lasix while having diarrhea I would hold propranolol as well for now.  Can resume once blood pressure improves.  CKD 3b Creatinine at baseline.  Continue to monitor Recent Labs    09/22/22 1547 09/22/22 1607 09/23/22 0224 11/26/22 1928 01/30/23 1953 02/17/23 1637 02/17/23 1912 02/23/23 1357 02/24/23 0052 02/24/23 0637  BUN 19 22 16 17 22 23   --  17 18 17   CREATININE 1.21* 1.30* 1.21* 1.39* 1.37* 1.24* 1.20* 0.92 0.89 0.89   CAD s/p stent HLD Continue Plavix.  Also on Lipitor at home.  I am not sure if Lipitor is good to continue in liver cirrhosis.  Severe aortic stenosis S/p TAVR in the past  Hypothyroidism Continue Synthroid  COPD Noted lung abnormalities in CT scan.  Do not look acute. Was given a dose of doxycycline in the ED.  I do not see a need to continue.  Can obtain procalcitonin level Follows up with pulmonologist as an outpatient Currently breathing on room air   Mobility: PT eval will help  Goals of care:   Code Status: Limited: Do not attempt resuscitation (DNR) -DNR-LIMITED -Do Not Intubate/DNI     DVT prophylaxis:   enoxaparin (LOVENOX) injection 40 mg Start: 02/24/23 1600   Antimicrobials: None Fluid: D5 NS at 75 mL/h Consultants: None Family Communication: None at bedside  Dispo: The patient is from: Home              Anticipated d/c is to: Home hopefully in 2 to 3 days  Diet: Diet Order             Diet clear liquid Room service appropriate? Yes; Fluid consistency: Thin  Diet effective now                    ------------------------------------------------------------------------------------- Severity of Illness: The appropriate patient status for this patient is OBSERVATION. Observation status is judged to be reasonable and necessary in order to provide the required intensity of service to ensure the patient's safety. The patient's presenting symptoms, physical exam findings, and initial radiographic and laboratory data in the context of their medical condition is felt to place them at decreased risk for further clinical deterioration.  Furthermore, it is anticipated that the patient will be medically stable for discharge from the hospital within 2 midnights of admission.  -------------------------------------------------------------------------------------   Home Meds: Prior to Admission medications   Medication Sig Start Date End Date Taking? Authorizing Provider  albuterol (VENTOLIN HFA) 108 (90 Base) MCG/ACT inhaler Inhale 2 puffs into the lungs every 6 (six) hours as needed for shortness of breath (use as needed or before activity for shortness of breath or whezzing). 02/24/23   Luciano Cutter, MD  Artificial Saliva (BIOTENE DRY MOUTH) LOZG Use as directed 1 Piece in the mouth or throat as needed (dry mouth).    [provider]  atorvastatin (LIPITOR) 20 MG tablet Take 1 tablet (20 mg total) by mouth daily. 12/25/22   Yates Decamp, MD  clopidogrel (PLAVIX) 75 MG tablet TAKE 1 TABLET BY MOUTH EVERY DAY 11/25/22   Yates Decamp, MD  diphenhydrAMINE (BENADRYL) 25 mg capsule Take 25  mg by mouth every 6 (six) hours as needed for allergies.    [provider]  fexofenadine (ALLEGRA) 180 MG tablet Take 180 mg by mouth daily as needed for allergies or rhinitis.    [provider]  fluticasone (FLONASE) 50 MCG/ACT nasal spray Place 2 sprays into both nostrils at bedtime.    [provider]  furosemide (LASIX) 20 MG tablet Take 1 tablet (20 mg total) by mouth as needed. Patient taking differently: Take 20 mg by mouth every Monday, Wednesday, and Friday. 07/05/22   Yates Decamp, MD  HYDROcodone-acetaminophen (NORCO/VICODIN) 5-325 MG tablet Take 1 tablet by mouth every 6 (six) hours as needed for moderate pain (pain score 4-6). 02/18/23   Kinsinger, De Blanch, MD  levothyroxine (SYNTHROID) 50 MCG tablet Take 50 mcg by mouth daily before breakfast. In am    [provider]  montelukast (SINGULAIR) 10 MG tablet Take 1 tablet (10 mg total) by mouth at bedtime. 10/29/22   Luciano Cutter, MD  Multiple Vitamin (MULTIVITAMIN WITH MINERALS) TABS tablet Take 1 tablet by mouth every other day.    [provider]  Peak Flow Meter DEVI 1 puff by Does not apply route in the morning and at bedtime. 05/12/20   Luciano Cutter, MD  Polyethyl Glycol-Propyl Glycol (SYSTANE) 0.4-0.3 % SOLN Place 1 drop into both eyes daily as needed (dry/irritated eyes.).    [provider]  potassium chloride (KLOR-CON) 10 MEQ tablet Take 1 tablet (10 mEq total) by mouth daily. Patient taking differently: Take 10 mEq by mouth every Monday, Wednesday, and Friday. 05/12/20   Cantwell, Celeste C, PA-C  propranolol (INDERAL) 20 MG tablet Take 1 tablet (20 mg total) by mouth 3 (three) times daily. Patient taking differently: Take 20 mg by mouth 2 (two) times daily. 02/03/23   Yates Decamp, MD  sodium chloride (OCEAN) 0.65 % nasal spray Place 1 spray into the nose every other day.    [provider]  Tiotropium Bromide-Olodaterol (STIOLTO RESPIMAT) 2.5-2.5 MCG/ACT AERS  Inhale 2 puffs into the lungs daily. 10/29/22   Luciano Cutter, MD    Labs on Admission:   CBC: Recent Labs  Lab 02/17/23 1912 02/23/23 1357  WBC 8.0 13.8*  NEUTROABS  --  10.6*  HGB 11.0* 11.8*  HCT 34.2* 33.8*  MCV 96.6 90.1  PLT 175 299    Basic Metabolic Panel: Recent Labs  Lab 02/17/23 1637 02/17/23 1912 02/23/23 1357 02/24/23 0052 02/24/23 0637  NA 136  --  122* 122* 125*  K 3.8  --  3.5 3.4* 3.6  CL 103  --  90* 93* 95*  CO2 24  --  23 21* 20*  GLUCOSE 115*  --  110* 101* 97  BUN 23  --  17 18 17   CREATININE 1.24* 1.20* 0.92 0.89 0.89  CALCIUM 8.7*  --  8.6* 7.7* 8.1*  MG  --   --  1.7  --   --     Liver Function Tests: Recent Labs  Lab 02/17/23 1637 02/23/23 1357  AST 111* 41  ALT 59* 33  ALKPHOS 374* 315*  BILITOT 1.3* 1.2  PROT 6.7 7.0  ALBUMIN 3.0* 3.3*   Recent Labs  Lab 02/23/23 1357  LIPASE 13   No results for input(s): "AMMONIA" in the last 168 hours.  Cardiac Enzymes: No results for input(s): "CKTOTAL", "CKMB", "CKMBINDEX", "TROPONINI" in the last 168 hours.  BNP (last 3 results) Recent Labs    02/23/23 1357  BNP 521.1*    ProBNP (last 3 results) No results for input(s): "PROBNP" in the last 8760 hours.  CBG: No results for input(s): "GLUCAP" in the last 168 hours.  Lipase     Component Value Date/Time   LIPASE 13 02/23/2023 1357     Urinalysis    Component Value Date/Time   COLORURINE YELLOW 02/23/2023 1912   APPEARANCEUR CLEAR 02/23/2023 1912   LABSPEC >1.046 (H) 02/23/2023 1912   PHURINE 6.5 02/23/2023 1912   GLUCOSEU NEGATIVE 02/23/2023 1912   HGBUR NEGATIVE 02/23/2023 1912   BILIRUBINUR NEGATIVE 02/23/2023 1912   KETONESUR TRACE (A) 02/23/2023 1912   PROTEINUR TRACE (A) 02/23/2023 1912   UROBILINOGEN 0.2 08/23/2018 1044   NITRITE NEGATIVE 02/23/2023 1912   LEUKOCYTESUR SMALL (A) 02/23/2023 1912     Drugs of Abuse  No results found for: "LABOPIA", "COCAINSCRNUR", "LABBENZ", "AMPHETMU", "THCU",  "LABBARB"    Radiological Exams on Admission: CT CHEST ABDOMEN PELVIS W CONTRAST Result Date: 02/23/2023 CLINICAL DATA:  Sepsis, abdominal pain, shortness of breath, recent hernia surgery. COPD. EXAM: CT CHEST, ABDOMEN, AND PELVIS WITH CONTRAST TECHNIQUE: Multidetector CT imaging of the chest, abdomen and pelvis was performed following the standard protocol during bolus administration of intravenous contrast. RADIATION DOSE REDUCTION: This exam was performed according to the departmental dose-optimization program which includes automated exposure control, adjustment of the mA and/or kV according to patient size and/or use of iterative reconstruction technique. CONTRAST:  85mL OMNIPAQUE IOHEXOL 300 MG/ML  SOLN COMPARISON:  Multiple exams, including 11/26/2022 12/25/2020 FINDINGS: CT CHEST FINDINGS Cardiovascular: Coronary, aortic arch, and branch vessel atherosclerotic vascular disease. TAVR noted. Mitral valve calcification. Mediastinum/Nodes: No pathologic adenopathy. Large hiatal hernia contains most of the stomach and bulges into the left posterior hemithorax, with mixed rotation. Lungs/Pleura: New small left pleural effusion and increased atelectasis and possibly mild superimposed pneumonia in the left lower lobe adjacent to the large hiatal hernia. Mild biapical pleuroparenchymal scarring. 9 by 4 mm crescentic nodule in the right middle lobe on image 89 series 4, similar sized on 01/01/2019 most compatible with a benign etiology. Reticulonodular opacity posteriorly in the right upper lobe with a tree-in-bud morphology on image 49 series 4 favoring atypical infectious bronchiolitis. Similar findings peripherally in the left upper lobe for example on image 49 series 4. Musculoskeletal: Remote 40% superior endplate compression fracture T12. Mild dextroconvex lower thoracic scoliosis. CT ABDOMEN PELVIS FINDINGS Hepatobiliary: Nodular contour compatible cirrhosis. No discrete mass lesion in the liver is observed.  Cholecystectomy. Pancreas: Mild fatty atrophy along portions of the pancreatic head. Spleen: Unremarkable Adrenals/Urinary Tract:  Small hypodense bilateral renal lesions are technically too small to characterize although statistically likely to be benign. No further imaging workup of these lesions is indicated. Adrenal glands appear normal. Stomach/Bowel: Air-levels in the distal colon favoring diarrheal process. Small diverticulum of the distal sigmoid colon. Mild wall thickening and mucosal enhancement in the rectum with perirectal edema, parents favors proctitis. No extraluminal gas or abscess. No pneumatosis. Appendix unremarkable. Vascular/Lymphatic: Atherosclerosis is present, including aortoiliac atherosclerotic disease. Patent celiac trunk and SMV. The inferior mesenteric artery opacifies favoring patency. Reproductive: Uterus absent. Other: Postoperative findings from recent umbilical hernia repair, with a small amount of edema and a small amount of gas along the subcutaneous repair site, and a small amount of edema in the omentum just below the repair site. Small amount of fluid in gas along the right spermatic cord, likely incidental postoperative findings related to the patient's right inguinal hernia repair. No current herniated bowel. Musculoskeletal: Grade 1 degenerative retrolisthesis at L2-3 and grade 1 degenerative anterolisthesis at L4-5. Levoconvex lumbar scoliosis with rotary component. Moderate degenerative hip arthropathy bilaterally. Left foraminal impingement at L3-4 and right foraminal impingement at L2-3 due to facet spurring. Degenerative endplate findings at L2-3. IMPRESSION: 1. New small left pleural effusion and increased atelectasis and possibly mild superimposed pneumonia in the left lower lobe adjacent to the large hiatal hernia. 2. Reticulonodular opacity posteriorly in the right upper lobe with a tree-in-bud morphology compatible with atypical infectious bronchiolitis. Similar  findings peripherally in the left upper lobe. 3. Wall thickening and mucosal enhancement in the rectum with perirectal edema, favoring proctitis. 4. Air-levels in the distal colon favoring diarrheal process. 5. Cirrhosis. 6. Postoperative findings from recent umbilical hernia repair and right inguinal hernia repair. No current herniated bowel. 7. Remote 40% superior endplate compression fracture T12. 8. Degenerative spondylosis and degenerative disc disease. 9.  Aortic Atherosclerosis (ICD10-I70.0). Electronically Signed   By: Gaylyn Rong M.D.   On: 02/23/2023 16:18   DG Chest Portable 1 View Result Date: 02/23/2023 CLINICAL DATA:  Shortness of breath and back pain. EXAM: PORTABLE CHEST 1 VIEW COMPARISON:  10/22/2021 FINDINGS: Stable cardiomediastinal contours. Status post TAVR. Aortic atherosclerosis. Large hiatal hernia is again noted and obscures the retrocardiac portions of the left lower lung. No pleural effusion, interstitial edema or airspace consolidation. Mild curvature of the lower thoracic spine is convex towards the right. IMPRESSION: 1. No acute cardiopulmonary disease. 2. Large hiatal hernia. Electronically Signed   By: Signa Kell M.D.   On: 02/23/2023 13:37     Signed, Lorin Glass, MD [facility] 02/24/2023

## 2023-02-25 ENCOUNTER — Observation Stay (HOSPITAL_COMMUNITY): Payer: Medicare Other

## 2023-02-25 DIAGNOSIS — E860 Dehydration: Secondary | ICD-10-CM | POA: Diagnosis present

## 2023-02-25 DIAGNOSIS — K219 Gastro-esophageal reflux disease without esophagitis: Secondary | ICD-10-CM | POA: Diagnosis present

## 2023-02-25 DIAGNOSIS — I130 Hypertensive heart and chronic kidney disease with heart failure and stage 1 through stage 4 chronic kidney disease, or unspecified chronic kidney disease: Secondary | ICD-10-CM | POA: Diagnosis present

## 2023-02-25 DIAGNOSIS — Z952 Presence of prosthetic heart valve: Secondary | ICD-10-CM | POA: Diagnosis not present

## 2023-02-25 DIAGNOSIS — I5032 Chronic diastolic (congestive) heart failure: Secondary | ICD-10-CM | POA: Diagnosis present

## 2023-02-25 DIAGNOSIS — E876 Hypokalemia: Secondary | ICD-10-CM | POA: Diagnosis present

## 2023-02-25 DIAGNOSIS — J9811 Atelectasis: Secondary | ICD-10-CM | POA: Diagnosis present

## 2023-02-25 DIAGNOSIS — N1832 Chronic kidney disease, stage 3b: Secondary | ICD-10-CM | POA: Diagnosis present

## 2023-02-25 DIAGNOSIS — E871 Hypo-osmolality and hyponatremia: Secondary | ICD-10-CM | POA: Diagnosis present

## 2023-02-25 DIAGNOSIS — Z1152 Encounter for screening for COVID-19: Secondary | ICD-10-CM | POA: Diagnosis not present

## 2023-02-25 DIAGNOSIS — R0600 Dyspnea, unspecified: Secondary | ICD-10-CM | POA: Diagnosis not present

## 2023-02-25 DIAGNOSIS — A084 Viral intestinal infection, unspecified: Secondary | ICD-10-CM | POA: Diagnosis present

## 2023-02-25 DIAGNOSIS — I2510 Atherosclerotic heart disease of native coronary artery without angina pectoris: Secondary | ICD-10-CM | POA: Diagnosis present

## 2023-02-25 DIAGNOSIS — K766 Portal hypertension: Secondary | ICD-10-CM | POA: Diagnosis present

## 2023-02-25 DIAGNOSIS — Z66 Do not resuscitate: Secondary | ICD-10-CM | POA: Diagnosis present

## 2023-02-25 DIAGNOSIS — E785 Hyperlipidemia, unspecified: Secondary | ICD-10-CM | POA: Diagnosis present

## 2023-02-25 DIAGNOSIS — J439 Emphysema, unspecified: Secondary | ICD-10-CM | POA: Diagnosis present

## 2023-02-25 DIAGNOSIS — E440 Moderate protein-calorie malnutrition: Secondary | ICD-10-CM | POA: Diagnosis present

## 2023-02-25 DIAGNOSIS — R197 Diarrhea, unspecified: Secondary | ICD-10-CM | POA: Diagnosis not present

## 2023-02-25 DIAGNOSIS — J90 Pleural effusion, not elsewhere classified: Secondary | ICD-10-CM | POA: Diagnosis not present

## 2023-02-25 DIAGNOSIS — K7460 Unspecified cirrhosis of liver: Secondary | ICD-10-CM | POA: Diagnosis present

## 2023-02-25 DIAGNOSIS — Z8249 Family history of ischemic heart disease and other diseases of the circulatory system: Secondary | ICD-10-CM | POA: Diagnosis not present

## 2023-02-25 DIAGNOSIS — K6289 Other specified diseases of anus and rectum: Secondary | ICD-10-CM | POA: Diagnosis not present

## 2023-02-25 DIAGNOSIS — Z79890 Hormone replacement therapy (postmenopausal): Secondary | ICD-10-CM | POA: Diagnosis not present

## 2023-02-25 DIAGNOSIS — E039 Hypothyroidism, unspecified: Secondary | ICD-10-CM | POA: Diagnosis present

## 2023-02-25 LAB — GASTROINTESTINAL PANEL BY PCR, STOOL (REPLACES STOOL CULTURE)

## 2023-02-25 LAB — BASIC METABOLIC PANEL
Anion gap: 10 (ref 5–15)
BUN: 13 mg/dL (ref 8–23)
CO2: 21 mmol/L — ABNORMAL LOW (ref 22–32)
Calcium: 7.5 mg/dL — ABNORMAL LOW (ref 8.9–10.3)
Chloride: 100 mmol/L (ref 98–111)
Creatinine, Ser: 1.1 mg/dL — ABNORMAL HIGH (ref 0.44–1.00)
GFR, Estimated: 48 mL/min — ABNORMAL LOW (ref >60)
Glucose, Bld: 121 mg/dL — ABNORMAL HIGH (ref 70–99)
Potassium: 3.1 mmol/L — ABNORMAL LOW (ref 3.5–5.1)
Sodium: 131 mmol/L — ABNORMAL LOW (ref 135–145)

## 2023-02-25 LAB — CBC
HCT: 29.2 % — ABNORMAL LOW (ref 36.0–46.0)
Hemoglobin: 9.9 g/dL — ABNORMAL LOW (ref 12.0–15.0)
MCH: 31.1 pg (ref 26.0–34.0)
MCHC: 33.9 g/dL (ref 30.0–36.0)
MCV: 91.8 fL (ref 80.0–100.0)
Platelets: 224 10*3/uL (ref 150–400)
RBC: 3.18 MIL/uL — ABNORMAL LOW (ref 3.87–5.11)
RDW: 14.4 % (ref 11.5–15.5)
WBC: 7.7 10*3/uL (ref 4.0–10.5)
nRBC: 0 % (ref 0.0–0.2)

## 2023-02-25 LAB — MAGNESIUM: Magnesium: 1.6 mg/dL — ABNORMAL LOW (ref 1.7–2.4)

## 2023-02-25 LAB — PROCALCITONIN: Procalcitonin: 0.2 ng/mL

## 2023-02-25 LAB — PHOSPHORUS: Phosphorus: 2.1 mg/dL — ABNORMAL LOW (ref 2.5–4.6)

## 2023-02-25 MED ORDER — MAGNESIUM SULFATE 4 GM/100ML IV SOLN
4.0000 g | Freq: Once | INTRAVENOUS | Status: AC
  Administered 2023-02-25: 4 g via INTRAVENOUS
  Filled 2023-02-25: qty 100

## 2023-02-25 MED ORDER — IPRATROPIUM-ALBUTEROL 0.5-2.5 (3) MG/3ML IN SOLN
3.0000 mL | Freq: Three times a day (TID) | RESPIRATORY_TRACT | Status: DC
Start: 2023-02-25 — End: 2023-02-26
  Administered 2023-02-25 – 2023-02-26 (×2): 3 mL via RESPIRATORY_TRACT
  Filled 2023-02-25 (×2): qty 3

## 2023-02-25 MED ORDER — LOPERAMIDE HCL 2 MG PO CAPS: 2.0000 mg | ORAL_CAPSULE | ORAL | Status: DC | PRN

## 2023-02-25 MED ORDER — POTASSIUM CHLORIDE CRYS ER 20 MEQ PO TBCR
40.0000 meq | EXTENDED_RELEASE_TABLET | ORAL | Status: AC
Start: 2023-02-25 — End: 2023-02-25
  Administered 2023-02-25 (×2): 40 meq via ORAL
  Filled 2023-02-25 (×2): qty 2

## 2023-02-25 MED ORDER — POTASSIUM PHOSPHATES 15 MMOLE/5ML IV SOLN
30.0000 mmol | Freq: Once | INTRAVENOUS | Status: AC
  Administered 2023-02-25: 30 mmol via INTRAVENOUS
  Filled 2023-02-25: qty 10

## 2023-02-25 MED ORDER — PHENOL 1.4 % MT LIQD: 1.0000 | OROMUCOSAL | Status: DC | PRN

## 2023-02-25 MED ORDER — ALBUTEROL SULFATE (2.5 MG/3ML) 0.083% IN NEBU: 2.5000 mg | INHALATION_SOLUTION | RESPIRATORY_TRACT | Status: DC | PRN

## 2023-02-25 MED ORDER — BUDESONIDE 0.25 MG/2ML IN SUSP
0.2500 mg | Freq: Two times a day (BID) | RESPIRATORY_TRACT | Status: DC
Start: 2023-02-25 — End: 2023-02-28
  Administered 2023-02-25 – 2023-02-28 (×6): 0.25 mg via RESPIRATORY_TRACT
  Filled 2023-02-25 (×6): qty 2

## 2023-02-25 NOTE — Evaluation (Signed)
 Occupational Therapy Evaluation Patient Details Name: [name] [name] MRN: [id number] DOB: 1931-11-02 Today's Date: 02/25/2023   History of Present Illness Patient is a 89 y.o. female who presented to the ED with intractable diarrhea. CT demonstrated new small pleural effusion-atelectasis-possible changes consistent with bronchiolitis-cirrhosis-postoperative findings from recent umbilical hernia and right inguinal hernia repair. Recent 1/27 laparoscopic right inguinal hernia repair with mesh-umbilical hernia repair, discharged home. Past history of HTN, HLD, aortic stenosis, COPD, liver cirrhosis, hypothyroidism.   Clinical Impression   [name] [name] was evaluated s/p the above admission list. Prior to surgery 1/27 she was completely indep, since surgery she was mod I with RW and had limited activity tolerance. Upon evaluation the pt was limited by generalized weakness, limited activity tolerance and unsteady gait. Overall she needed CGA for transfers and short mobility within the room with RW. Due to the deficits listed below the pt also needs up to mod A to safely complete LB ADLs and set up A for UB ADLs in sitting. Pt will benefit from continued acute OT services and HHOT.       If plan is discharge home, recommend the following: A little help with walking and/or transfers;A lot of help with bathing/dressing/bathroom;Assistance with cooking/housework;Direct supervision/assist for medications management;Direct supervision/assist for financial management;Help with stairs or ramp for entrance;Assist for transportation    Functional Status Assessment  Patient has had a recent decline in their functional status and demonstrates the ability to make significant improvements in function in a reasonable and predictable amount of time.  Equipment Recommendations  None recommended by OT    Recommendations for Other Services       Precautions / Restrictions Precautions Precautions: Fall;Other  (comment) Precaution Comments: Recent abd sx on 1/27 Restrictions Weight Bearing Restrictions Per Provider Order: No      Mobility Bed Mobility Overal bed mobility: Needs Assistance Bed Mobility: Sit to Supine       Sit to supine: Contact guard assist   General bed mobility comments: incr time    Transfers Overall transfer level: Needs assistance Equipment used: Rolling walker (2 wheels) Transfers: Sit to/from Stand Sit to Stand: Contact guard assist           General transfer comment: incr time      Balance Overall balance assessment: Needs assistance Sitting-balance support: Bilateral upper extremity supported, Feet supported Sitting balance-Leahy Scale: Fair     Standing balance support: Bilateral upper extremity supported, During functional activity Standing balance-Leahy Scale: Poor                             ADL either performed or assessed with clinical judgement   ADL Overall ADL's : Needs assistance/impaired Eating/Feeding: Set up;Sitting   Grooming: Set up;Sitting   Upper Body Bathing: Set up;Sitting   Lower Body Bathing: Moderate assistance;Sit to/from stand   Upper Body Dressing : Set up;Sitting   Lower Body Dressing: Moderate assistance;Sit to/from stand   Toilet Transfer: Contact guard assist;Ambulation;Rolling walker (2 wheels)   Toileting- Clothing Manipulation and Hygiene: Contact guard assist;Sitting/lateral lean       Functional mobility during ADLs: Contact guard assist;Rolling walker (2 wheels) General ADL Comments: limited by activity tolerance and generalized weakness     Vision Baseline Vision/History: 1 Wears glasses Vision Assessment?: No apparent visual deficits     Perception Perception: Not tested       Praxis Praxis: Not tested       Pertinent Vitals/Pain Pain Assessment  Pain Assessment: Faces Faces Pain Scale: Hurts a little bit Pain Location: Low back Pain Descriptors / Indicators:  Discomfort, Sore Pain Intervention(s): Limited activity within patient's tolerance, Monitored during session     Extremity/Trunk Assessment Upper Extremity Assessment Upper Extremity Assessment: Generalized weakness   Lower Extremity Assessment Lower Extremity Assessment: Generalized weakness   Cervical / Trunk Assessment Cervical / Trunk Assessment: Kyphotic   Communication Communication Communication: Hearing impairment Cueing Techniques: Verbal cues;Tactile cues   Cognition Arousal: Alert Behavior During Therapy: WFL for tasks assessed/performed, Anxious Overall Cognitive Status: Within Functional Limits for tasks assessed                                       General Comments  VSS, family present    Exercises     Shoulder Instructions      Home Living Family/patient expects to be discharged to:: Private residence Living Arrangements: Spouse/significant other Available Help at Discharge: Family;Available PRN/intermittently Type of Home: House Home Access: Other (comment)     Home Layout: Able to live on main level with bedroom/bathroom     Bathroom Shower/Tub: Producer, Television/film/video: Standard     Home Equipment: Wheelchair - Nurse, Children's (2 wheels)   Additional Comments: 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 ADLs Comments: Mod I needing rest breaks throughout the day        OT Problem List: Decreased strength;Decreased range of motion;Decreased activity tolerance;Impaired balance (sitting and/or standing);Decreased safety awareness;Decreased knowledge of use of DME or AE;Decreased knowledge of precautions      OT  Treatment/Interventions: Self-care/ADL training;Therapeutic exercise;DME and/or AE instruction;Energy conservation;Therapeutic activities;Patient/family education    OT Goals(Current goals can be found in the care plan section) Acute Rehab OT Goals Patient Stated Goal: to feel better OT Goal Formulation: With patient Time For Goal Achievement: 03/11/23 Potential to Achieve Goals: Fair ADL Goals Pt Will Perform Grooming: with modified independence;standing Pt Will Perform Lower Body Dressing: with modified independence;sit to/from stand Pt Will Transfer to Toilet: with modified independence;ambulating Additional ADL Goal #1: pt will complete at least 10 minutes of OOB activity to demonstrate improved tolerance for ADLs Additional ADL Goal #2: Pt will indep recall at least 3 energy conservation strategies to ensure safe transition to home environment at discharge  OT Frequency: Min 1X/week    Co-evaluation              AM-PAC OT 6 Clicks Daily Activity     Outcome Measure Help from another person eating meals?: A Little Help from another person taking care of personal grooming?: A Little Help from another person toileting, which includes using toliet, bedpan, or urinal?: A Little Help from another person bathing (including washing, rinsing, drying)?: A Lot Help from another person to put on and taking off regular upper body clothing?: A Little Help from another person to put on and taking off regular lower body clothing?: A Lot 6 Click Score: 16   End of Session Equipment Utilized During Treatment: Rolling walker (2 wheels) Nurse Communication: Mobility status  Activity Tolerance: Patient tolerated treatment well Patient left:  in bed;with call bell/phone within reach  OT Visit Diagnosis: Unsteadiness on feet (R26.81);Other abnormalities of gait and mobility (R26.89);Muscle weakness (generalized) (M62.81)                Time: 8481-8464 OT Time Calculation (min): 17  min Charges:  OT General Charges $OT Visit: 1 Visit OT Evaluation $OT Eval Moderate Complexity: 1 Mod  Lucie Kendall, OTR/L Acute Rehabilitation Services Office [phone] Secure Chat Communication Preferred   Lucie JONETTA Kendall 02/25/2023, 3:40 PM

## 2023-02-25 NOTE — Progress Notes (Addendum)
 PROGRESS NOTE        PATIENT DETAILS Name: [name] [name] Age: 89 y.o. Sex: female Date of Birth: 1931/07/19 Admit Date: 02/23/2023 Admitting Physician Chapman Rota, MD ERE:[name], Velna SAUNDERS, MD  Brief Summary: Patient is a 89 y.o.  female with history of HTN, HLD, aortic stenosis, COPD, liver cirrhosis, hypothyroidism-who presented to the ED with intractable diarrhea.  She was subsequently admitted to the hospitalist service.  Significant events: 1/27>> laparoscopic right inguinal hernia repair with mesh-umbilical hernia repair 2/02>> to ED with diarrhea-admit to [facility].  Significant studies: 2/02>> CT chest/abdomen/pelvis: New small pleural effusion-atelectasis-possible changes consistent with bronchiolitis-cirrhosis-postoperative findings from recent umbilical hernia and right inguinal hernia repair.  Significant microbiology data: 2/2>> stool C. difficile studies: Negative 2/2>> stool GI pathogen panel: Negative 2/2>> COVID/influenza/RSV PCR: Negative  Procedures: None  Consults: None  Subjective: Lying comfortably in bed-denies any chest pain or shortness of breath.  Diarrhea has resolved-no diarrhea since midnight.  Objective: Vitals: Blood pressure 110/61, pulse 88, temperature (!) 97.3 F (36.3 C), temperature source Oral, resp. rate 20, SpO2 98%.   Exam: Gen Exam:Alert awake-not in any distress HEENT:atraumatic, normocephalic Chest: B/L clear to auscultation anteriorly CVS:S1S2 regular Abdomen:soft non tender, non distended-laparoscopic incision sites appear benign. Extremities:no edema Neurology: Non focal Skin: no rash  Pertinent Labs/Radiology:    Latest Ref Rng & Units 02/25/2023    4:23 AM 02/23/2023    1:57 PM 02/17/2023    7:12 PM  CBC  WBC 4.0 - 10.5 K/uL 7.7  13.8  8.0   Hemoglobin 12.0 - 15.0 g/dL 9.9  88.1  88.9   Hematocrit 36.0 - 46.0 % 29.2  33.8  34.2   Platelets 150 - 400 K/uL 224  299  175     Lab Results   Component Value Date   NA 131 (L) 02/25/2023   K 3.1 (L) 02/25/2023   CL 100 02/25/2023   CO2 21 (L) 02/25/2023      Assessment/Plan: Diarrhea Likely viral etiology-her daughter has similar symptoms Stool studies are negative Thankfully diarrhea has resolved CT imaging with possible colitis/proctitis-I suspect this is related to her diarrheal illness. Stop IVF-advance to full liquids-mobilize with PT/OT and see how she does Change Imodium  to as needed  Hypovolemic hyponatremia Improved-with IVF Stop IVF and encourage oral intake  Hypokalemia Secondary to GI loss Replete/recheck  Hypomagnesemia Replete/recheck  Hypophosphatemia Replete/recheck  ?  PNA/bronchiolitis Here with diarrhea-does not have any major respiratory issues-no symptoms suggestive of pneumonia Not sure what to make of her CT imaging-monitor clinically-already on bronchodilators  CKD stage IIIb At baseline  CAD s/p PCI 2020 No anginal symptoms Continue Plavix   History of severe AS-s/p TAVR 2021 Supportive care  COPD Not in exacerbation Continue bronchodilators  Liver cirrhosis/portal hypertension Stable/compensated Resume diuretics in the next day or so Propranolol  on hold-will resume when BP a bit more stable.  Hypothyroidism Synthroid   Recent/inguinal hernia repair Benign exam but complains of some intermittent abdominal pain (CT abdomen unrevealing) Minimize narcotics as much as possible Follow-up with general surgery as previously scheduled  Addendum: Continues to have fluctuating degrees of abdominal pain-suspect colitis/proctitis seen on CT imaging is probably due to recent diarrhea.  Dr. Stevie  to see later today.  Debility/deconditioning Secondary to diarrhea-recent hernia repair PT/OT eval pending  BMI: Estimated body mass index is 21.87 kg/m as calculated from the  following:   Height as of 02/17/23: 5' (1.524 m).   Weight as of 02/17/23: 50.8 kg.   Code status:    Code Status: Limited: Do not attempt resuscitation (DNR) -DNR-LIMITED -Do Not Intubate/DNI    DVT Prophylaxis: enoxaparin  (LOVENOX ) injection 40 mg Start: 02/24/23 1600   Family Communication: None at bedside   Disposition Plan: Status is: Observation The patient will require care spanning > 2 midnights and should be moved to inpatient because: Severity of illness   Planned Discharge Destination:Home health   Diet: Diet Order             Diet full liquid Fluid consistency: Thin  Diet effective now                     Antimicrobial agents: Anti-infectives (From admission, onward)    Start     Dose/Rate Route Frequency Ordered Stop   02/23/23 1700  doxycycline  (VIBRA -TABS) tablet 100 mg        100 mg Oral  Once 02/23/23 1653 02/23/23 1714        MEDICATIONS: Scheduled Meds:  alum & mag hydroxide-simeth  15 mL Oral TID AC & HS   arformoterol   15 mcg Nebulization BID   And   umeclidinium bromide   1 puff Inhalation Daily   clopidogrel   75 mg Oral Daily   enoxaparin  (LOVENOX ) injection  40 mg Subcutaneous Q24H   levothyroxine   50 mcg Oral QAC breakfast   montelukast   10 mg Oral QHS   pantoprazole  (PROTONIX ) IV  40 mg Intravenous QAC breakfast   potassium chloride   40 mEq Oral Q4H   Continuous Infusions: PRN Meds:.acetaminophen  **OR** acetaminophen , albuterol , hydrALAZINE , loperamide , oxyCODONE    I have personally reviewed following labs and imaging studies  LABORATORY DATA: CBC: Recent Labs  Lab 02/23/23 1357 02/25/23 0423  WBC 13.8* 7.7  NEUTROABS 10.6*  --   HGB 11.8* 9.9*  HCT 33.8* 29.2*  MCV 90.1 91.8  PLT 299 224    Basic Metabolic Panel: Recent Labs  Lab 02/23/23 1357 02/24/23 0052 02/24/23 0637 02/25/23 0423  NA 122* 122* 125* 131*  K 3.5 3.4* 3.6 3.1*  CL 90* 93* 95* 100  CO2 23 21* 20* 21*  GLUCOSE 110* 101* 97 121*  BUN 17 18 17 13   CREATININE 0.92 0.89 0.89 1.10*  CALCIUM  8.6* 7.7* 8.1* 7.5*  MG 1.7  --   --  1.6*  PHOS   --   --   --  2.1*    GFR: Estimated Creatinine Clearance: 24.9 mL/min (A) (by C-G formula based on SCr of 1.1 mg/dL (H)).  Liver Function Tests: Recent Labs  Lab 02/23/23 1357  AST 41  ALT 33  ALKPHOS 315*  BILITOT 1.2  PROT 7.0  ALBUMIN  3.3*   Recent Labs  Lab 02/23/23 1357  LIPASE 13   No results for input(s): AMMONIA in the last 168 hours.  Coagulation Profile: No results for input(s): INR, PROTIME in the last 168 hours.  Cardiac Enzymes: No results for input(s): CKTOTAL, CKMB, CKMBINDEX, TROPONINI in the last 168 hours.  BNP (last 3 results) No results for input(s): PROBNP in the last 8760 hours.  Lipid Profile: No results for input(s): CHOL, HDL, LDLCALC, TRIG, CHOLHDL, LDLDIRECT in the last 72 hours.  Thyroid  Function Tests: Recent Labs    02/24/23 0601  TSH 5.712*    Anemia Panel: No results for input(s): VITAMINB12, FOLATE, FERRITIN, TIBC, IRON, RETICCTPCT in the last 72 hours.  Urine analysis:  Component Value Date/Time   COLORURINE YELLOW 02/23/2023 1912   APPEARANCEUR CLEAR 02/23/2023 1912   LABSPEC >1.046 (H) 02/23/2023 1912   PHURINE 6.5 02/23/2023 1912   GLUCOSEU NEGATIVE 02/23/2023 1912   HGBUR NEGATIVE 02/23/2023 1912   BILIRUBINUR NEGATIVE 02/23/2023 1912   KETONESUR TRACE (A) 02/23/2023 1912   PROTEINUR TRACE (A) 02/23/2023 1912   UROBILINOGEN 0.2 08/23/2018 1044   NITRITE NEGATIVE 02/23/2023 1912   LEUKOCYTESUR SMALL (A) 02/23/2023 1912    Sepsis Labs: Lactic Acid, Venous    Component Value Date/Time   LATICACIDVEN 1.5 02/23/2023 1715    MICROBIOLOGY: Recent Results (from the past 240 hours)  Resp panel by RT-PCR (RSV, Flu A&B, Covid) Anterior Nasal Swab     Status: None   Collection Time: 02/23/23  1:57 PM   Specimen: Anterior Nasal Swab  Result Value Ref Range Status   SARS Coronavirus 2 by RT PCR NEGATIVE NEGATIVE Final    Comment: (NOTE) SARS-CoV-2 target nucleic acids are  NOT DETECTED.  The SARS-CoV-2 RNA is generally detectable in upper respiratory specimens during the acute phase of infection. The lowest concentration of SARS-CoV-2 viral copies this assay can detect is 138 copies/mL. A negative result does not preclude SARS-Cov-2 infection and should not be used as the sole basis for treatment or other patient management decisions. A negative result may occur with  improper specimen collection/handling, submission of specimen other than nasopharyngeal swab, presence of viral mutation(s) within the areas targeted by this assay, and inadequate number of viral copies(<138 copies/mL). A negative result must be combined with clinical observations, patient history, and epidemiological information. The expected result is Negative.  Fact Sheet for Patients:  bloggercourse.com  Fact Sheet for Healthcare Providers:  seriousbroker.it  This test is no t yet approved or cleared by the [place]  FDA and  has been authorized for detection and/or diagnosis of SARS-CoV-2 by FDA under an Emergency Use Authorization (EUA). This EUA will remain  in effect (meaning this test can be used) for the duration of the COVID-19 declaration under Section 564(b)(1) of the Act, 21 U.S.C.section 360bbb-3(b)(1), unless the authorization is terminated  or revoked sooner.       Influenza A by PCR NEGATIVE NEGATIVE Final   Influenza B by PCR NEGATIVE NEGATIVE Final    Comment: (NOTE) The Xpert Xpress SARS-CoV-2/FLU/RSV plus assay is intended as an aid in the diagnosis of influenza from Nasopharyngeal swab specimens and should not be used as a sole basis for treatment. Nasal washings and aspirates are unacceptable for Xpert Xpress SARS-CoV-2/FLU/RSV testing.  Fact Sheet for Patients: bloggercourse.com  Fact Sheet for Healthcare Providers: seriousbroker.it  This test is not  yet approved or cleared by the [place]  FDA and has been authorized for detection and/or diagnosis of SARS-CoV-2 by FDA under an Emergency Use Authorization (EUA). This EUA will remain in effect (meaning this test can be used) for the duration of the COVID-19 declaration under Section 564(b)(1) of the Act, 21 U.S.C. section 360bbb-3(b)(1), unless the authorization is terminated or revoked.     Resp Syncytial Virus by PCR NEGATIVE NEGATIVE Final    Comment: (NOTE) Fact Sheet for Patients: bloggercourse.com  Fact Sheet for Healthcare Providers: seriousbroker.it  This test is not yet approved or cleared by the [place]  FDA and has been authorized for detection and/or diagnosis of SARS-CoV-2 by FDA under an Emergency Use Authorization (EUA). This EUA will remain in effect (meaning this test can be used) for the duration of the COVID-19 declaration under  Section 564(b)(1) of the Act, 21 U.S.C. section 360bbb-3(b)(1), unless the authorization is terminated or revoked.  Performed at [facility], [address], [place], [place] [address]   Gastrointestinal Panel by PCR , Stool     Status: None   Collection Time: 02/23/23  1:57 PM   Specimen: Urine, Clean Catch; Stool  Result Value Ref Range Status   Campylobacter species NOT DETECTED NOT DETECTED Final   Plesimonas shigelloides NOT DETECTED NOT DETECTED Final   Salmonella species NOT DETECTED NOT DETECTED Final   Yersinia enterocolitica NOT DETECTED NOT DETECTED Final   Vibrio species NOT DETECTED NOT DETECTED Final   Vibrio cholerae NOT DETECTED NOT DETECTED Final   Enteroaggregative E coli (EAEC) NOT DETECTED NOT DETECTED Final   Enteropathogenic E coli (EPEC) NOT DETECTED NOT DETECTED Final   Enterotoxigenic E coli (ETEC) NOT DETECTED NOT DETECTED Final   Shiga like toxin producing E coli (STEC) NOT DETECTED NOT DETECTED Final    Shigella/Enteroinvasive E coli (EIEC) NOT DETECTED NOT DETECTED Final   Cryptosporidium NOT DETECTED NOT DETECTED Final   Cyclospora cayetanensis NOT DETECTED NOT DETECTED Final   Entamoeba histolytica NOT DETECTED NOT DETECTED Final   Giardia lamblia NOT DETECTED NOT DETECTED Final   Adenovirus F40/41 NOT DETECTED NOT DETECTED Final   Astrovirus NOT DETECTED NOT DETECTED Final   Norovirus GI/GII NOT DETECTED NOT DETECTED Final   Rotavirus A NOT DETECTED NOT DETECTED Final   Sapovirus (I, II, IV, and V) NOT DETECTED NOT DETECTED Final    Comment: Performed at [facility], [address] [place], [place] [address]  C Difficile Quick Screen w PCR reflex     Status: None   Collection Time: 02/23/23  3:00 PM  Result Value Ref Range Status   C Diff antigen NEGATIVE NEGATIVE Final   C Diff toxin NEGATIVE NEGATIVE Final   C Diff interpretation No C. difficile detected.  Final    Comment: Performed at [facility], [address], [place], [place] [address]    RADIOLOGY STUDIES/RESULTS: CT CHEST ABDOMEN PELVIS W CONTRAST Result Date: 02/23/2023 CLINICAL DATA:  Sepsis, abdominal pain, shortness of breath, recent hernia surgery. COPD. EXAM: CT CHEST, ABDOMEN, AND PELVIS WITH CONTRAST TECHNIQUE: Multidetector CT imaging of the chest, abdomen and pelvis was performed following the standard protocol during bolus administration of intravenous contrast. RADIATION DOSE REDUCTION: This exam was performed according to the departmental dose-optimization program which includes automated exposure control, adjustment of the mA and/or kV according to patient size and/or use of iterative reconstruction technique. CONTRAST:  85mL OMNIPAQUE  IOHEXOL  300 MG/ML  SOLN COMPARISON:  Multiple exams, including 11/26/2022 12/25/2020 FINDINGS: CT CHEST FINDINGS Cardiovascular: Coronary, aortic arch, and branch vessel atherosclerotic vascular disease. TAVR noted. Mitral valve calcification. Mediastinum/Nodes: No  pathologic adenopathy. Large hiatal hernia contains most of the stomach and bulges into the left posterior hemithorax, with mixed rotation. Lungs/Pleura: New small left pleural effusion and increased atelectasis and possibly mild superimposed pneumonia in the left lower lobe adjacent to the large hiatal hernia. Mild biapical pleuroparenchymal scarring. 9 by 4 mm crescentic nodule in the right middle lobe on image 89 series 4, similar sized on 01/01/2019 most compatible with a benign etiology. Reticulonodular opacity posteriorly in the right upper lobe with a tree-in-bud morphology on image 49 series 4 favoring atypical infectious bronchiolitis. Similar findings peripherally in the left upper lobe for example on image 49 series 4. Musculoskeletal: Remote 40% superior endplate compression fracture T12. Mild dextroconvex lower thoracic  scoliosis. CT ABDOMEN PELVIS FINDINGS Hepatobiliary: Nodular contour compatible cirrhosis. No discrete mass lesion in the liver is observed. Cholecystectomy. Pancreas: Mild fatty atrophy along portions of the pancreatic head. Spleen: Unremarkable Adrenals/Urinary Tract: Small hypodense bilateral renal lesions are technically too small to characterize although statistically likely to be benign. No further imaging workup of these lesions is indicated. Adrenal glands appear normal. Stomach/Bowel: Air-levels in the distal colon favoring diarrheal process. Small diverticulum of the distal sigmoid colon. Mild wall thickening and mucosal enhancement in the rectum with perirectal edema, parents favors proctitis. No extraluminal gas or abscess. No pneumatosis. Appendix unremarkable. Vascular/Lymphatic: Atherosclerosis is present, including aortoiliac atherosclerotic disease. Patent celiac trunk and SMV. The inferior mesenteric artery opacifies favoring patency. Reproductive: Uterus absent. Other: Postoperative findings from recent umbilical hernia repair, with a small amount of edema and a small  amount of gas along the subcutaneous repair site, and a small amount of edema in the omentum just below the repair site. Small amount of fluid in gas along the right spermatic cord, likely incidental postoperative findings related to the patient's right inguinal hernia repair. No current herniated bowel. Musculoskeletal: Grade 1 degenerative retrolisthesis at L2-3 and grade 1 degenerative anterolisthesis at L4-5. Levoconvex lumbar scoliosis with rotary component. Moderate degenerative hip arthropathy bilaterally. Left foraminal impingement at L3-4 and right foraminal impingement at L2-3 due to facet spurring. Degenerative endplate findings at L2-3. IMPRESSION: 1. New small left pleural effusion and increased atelectasis and possibly mild superimposed pneumonia in the left lower lobe adjacent to the large hiatal hernia. 2. Reticulonodular opacity posteriorly in the right upper lobe with a tree-in-bud morphology compatible with atypical infectious bronchiolitis. Similar findings peripherally in the left upper lobe. 3. Wall thickening and mucosal enhancement in the rectum with perirectal edema, favoring proctitis. 4. Air-levels in the distal colon favoring diarrheal process. 5. Cirrhosis. 6. Postoperative findings from recent umbilical hernia repair and right inguinal hernia repair. No current herniated bowel. 7. Remote 40% superior endplate compression fracture T12. 8. Degenerative spondylosis and degenerative disc disease. 9.  Aortic Atherosclerosis (ICD10-I70.0). Electronically Signed   By: Ryan Salvage M.D.   On: 02/23/2023 16:18   DG Chest Portable 1 View Result Date: 02/23/2023 CLINICAL DATA:  Shortness of breath and back pain. EXAM: PORTABLE CHEST 1 VIEW COMPARISON:  10/22/2021 FINDINGS: Stable cardiomediastinal contours. Status post TAVR. Aortic atherosclerosis. Large hiatal hernia is again noted and obscures the retrocardiac portions of the left lower lung. No pleural effusion, interstitial edema or  airspace consolidation. Mild curvature of the lower thoracic spine is convex towards the right. IMPRESSION: 1. No acute cardiopulmonary disease. 2. Large hiatal hernia. Electronically Signed   By: Waddell Calk M.D.   On: 02/23/2023 13:37     LOS: 0 days   Donalda Applebaum, MD  [facility]    To contact the attending provider between 7A-7P or the covering provider during after hours 7P-7A, please log into the web site [email or web address] and access using universal [place] password for that web site. If you do not have the password, please call the hospital operator.  02/25/2023, 10:21 AM

## 2023-02-25 NOTE — Plan of Care (Signed)

## 2023-02-25 NOTE — Progress Notes (Signed)
I came and spoke with the patient. She had bad diarrhea and has not been taking in much PO. C diff and stool studies negative. CT showing proctitis. Incisions c/d/I, no ecchymosis or recurrence at hernia repair site. -I agree with medical plan -She seems to still have pain with movement

## 2023-02-25 NOTE — Evaluation (Signed)
 Physical Therapy Evaluation Patient Details Name: [name] [name] MRN: [id number] DOB: 05/10/1931 Today's Date: 02/25/2023  History of Present Illness  Patient is a 89 y.o.  female who presented to the ED with intractable diarrhea. Past history of HTN, HLD, aortic stenosis, COPD, liver cirrhosis, hypothyroidism.  Clinical Impression  Pt presents with admitting diagnosis above. Pt today was able to ambulate short distance in room with RW at supervision level. PTA pt reports that prior to recent hernia surgery on 1/27 she was fully independent with no AD. Since the surgery, pt reports that she was using a RW however has gotten progressively weaker. Recommend pt continue with HHPT upon DC however would benefit from frequent ambulation during acute stay to improve endurance. PT will continue to follow.         If plan is discharge home, recommend the following: A little help with walking and/or transfers;A little help with bathing/dressing/bathroom;Assistance with cooking/housework;Assist for transportation;Help with stairs or ramp for entrance   Can travel by private vehicle        Equipment Recommendations None recommended by PT  Recommendations for Other Services       Functional Status Assessment Patient has had a recent decline in their functional status and demonstrates the ability to make significant improvements in function in a reasonable and predictable amount of time.     Precautions / Restrictions Precautions Precautions: Fall;Other (comment) Precaution Comments: Recent abd sx on 1/27 Restrictions Weight Bearing Restrictions Per Provider Order: No      Mobility  Bed Mobility Overal bed mobility: Needs Assistance Bed Mobility: Supine to Sit     Supine to sit: Supervision     General bed mobility comments: No physical assist provided.    Transfers Overall transfer level: Needs assistance Equipment used: Rolling walker (2 wheels) Transfers: Sit to/from Stand Sit to  Stand: Supervision           General transfer comment: Cues for hand placement    Ambulation/Gait Ambulation/Gait assistance: Supervision Gait Distance (Feet): 15 Feet Assistive device: Rolling walker (2 wheels) Gait Pattern/deviations: Step-through pattern, Decreased stride length Gait velocity: decreased     General Gait Details: Overall steady gait with RW. Pt noted with some DOE. Distance limited by fatigue.  Stairs            Wheelchair Mobility     Tilt Bed    Modified Rankin (Stroke Patients Only)       Balance Overall balance assessment: Needs assistance Sitting-balance support: Bilateral upper extremity supported, Feet supported Sitting balance-Leahy Scale: Fair Sitting balance - Comments: EOB   Standing balance support: Bilateral upper extremity supported, During functional activity Standing balance-Leahy Scale: Poor Standing balance comment: Reliant on RW                             Pertinent Vitals/Pain Pain Assessment Pain Assessment: 0-10 Pain Score: 7  Pain Location: Low back Pain Descriptors / Indicators: Discomfort, Sore Pain Intervention(s): Monitored during session    Home Living Family/patient expects to be discharged to:: Private residence Living Arrangements: Spouse/significant other Available Help at Discharge: Family;Available PRN/intermittently Type of Home: House Home Access: Other (comment)       Home Layout: Able to live on main level with bedroom/bathroom Home Equipment: Wheelchair - Nurse, [facility] (2 wheels) Additional Comments: 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 ADLs Comments: Mod I needing rest breaks throughout the  day     Extremity/Trunk Assessment   Upper Extremity Assessment Upper Extremity Assessment: Generalized weakness    Lower Extremity Assessment Lower Extremity Assessment: Generalized weakness    Cervical / Trunk Assessment Cervical / Trunk Assessment: Kyphotic  Communication   Communication Communication: Hearing impairment Cueing Techniques: Verbal cues;Tactile cues  Cognition Arousal: Alert Behavior During Therapy: WFL for tasks assessed/performed, Anxious Overall Cognitive Status: Within Functional Limits for tasks assessed                                          General Comments General comments (skin integrity, edema, etc.): VSS on RA    Exercises     Assessment/Plan    PT Assessment Patient needs continued PT services  PT Problem List Decreased strength;Decreased activity tolerance;Decreased balance;Decreased mobility;Decreased knowledge of use of DME;Pain       PT Treatment Interventions DME instruction;Gait training;Functional mobility training;Therapeutic activities;Therapeutic exercise;Patient/family education;Balance training    PT Goals (Current goals can be found in the Care Plan section)  Acute Rehab PT Goals Patient Stated Goal: to get stronger PT Goal Formulation: With patient Time For Goal Achievement: 03/11/23 Potential to Achieve Goals: Good    Frequency Min 1X/week     Co-evaluation               AM-PAC PT 6 Clicks Mobility  Outcome Measure Help needed turning from your back to your side while in a flat bed without using bedrails?: A Little Help needed moving from lying on your back to sitting on the side of a flat bed without using bedrails?: A Little Help needed moving to and from a bed to a chair (including a wheelchair)?: A Little Help needed standing up from a chair using your arms (e.g., wheelchair or bedside chair)?: A Little Help needed to walk in hospital room?: A Little Help needed climbing 3-5 steps  with a railing? : A Little 6 Click Score: 18    End of Session Equipment Utilized During Treatment: Gait belt Activity Tolerance: Patient limited by fatigue Patient left: Other (comment);with call bell/phone within reach (on [facility]) Nurse Communication: Mobility status PT Visit Diagnosis: Other abnormalities of gait and mobility (R26.89)    Time: 8970-8950 PT Time Calculation (min) (ACUTE ONLY): 20 min   Charges:   PT Evaluation $PT Eval Moderate Complexity: 1 Mod   PT General Charges $$ ACUTE PT VISIT: 1 Visit         Naylea Wigington B, PT, DPT Acute Rehab Services [phone]   Alyra Patty 02/25/2023, 1:34 PM

## 2023-02-25 NOTE — Care Management Obs Status (Signed)
MEDICARE OBSERVATION STATUS NOTIFICATION   Patient Details  Name: [name] [name] MRN: [id number] Date of Birth: [birth year]   Medicare Observation Status Notification Given:  Yes    Gordy Clement, RN 02/25/2023, 3:38 PM

## 2023-02-25 NOTE — Progress Notes (Signed)
Patient was discharged 02/19/23. This RN walked into the room while patient was on the phone with her daughter. I asked her if she was ready to go home today and patient replied "yes". Daughter then asked me at that time if I would go over DC instructions with her on the phone. I printed the AVS and read it to [name] [name] and her daughter. All questions were answered and daughter was agreeable with DC. Patient was taken to the main entrance via wheelchair to be picked up by daughter.

## 2023-02-26 ENCOUNTER — Telehealth (HOSPITAL_BASED_OUTPATIENT_CLINIC_OR_DEPARTMENT_OTHER): Payer: Self-pay | Admitting: Pulmonary Disease

## 2023-02-26 DIAGNOSIS — E871 Hypo-osmolality and hyponatremia: Secondary | ICD-10-CM | POA: Diagnosis not present

## 2023-02-26 DIAGNOSIS — J90 Pleural effusion, not elsewhere classified: Secondary | ICD-10-CM

## 2023-02-26 DIAGNOSIS — E876 Hypokalemia: Secondary | ICD-10-CM | POA: Diagnosis not present

## 2023-02-26 DIAGNOSIS — R197 Diarrhea, unspecified: Secondary | ICD-10-CM | POA: Diagnosis not present

## 2023-02-26 DIAGNOSIS — R0600 Dyspnea, unspecified: Secondary | ICD-10-CM

## 2023-02-26 LAB — URINALYSIS, ROUTINE W REFLEX MICROSCOPIC
Bilirubin Urine: NEGATIVE
Glucose, UA: NEGATIVE mg/dL
Hgb urine dipstick: NEGATIVE
Ketones, ur: NEGATIVE mg/dL
Nitrite: NEGATIVE
Protein, ur: NEGATIVE mg/dL
Specific Gravity, Urine: 1.003 — ABNORMAL LOW (ref 1.005–1.030)
pH: 6 (ref 5.0–8.0)

## 2023-02-26 LAB — BASIC METABOLIC PANEL
Anion gap: 11 (ref 5–15)
BUN: 11 mg/dL (ref 8–23)
CO2: 18 mmol/L — ABNORMAL LOW (ref 22–32)
Calcium: 8 mg/dL — ABNORMAL LOW (ref 8.9–10.3)
Chloride: 99 mmol/L (ref 98–111)
Creatinine, Ser: 1.15 mg/dL — ABNORMAL HIGH (ref 0.44–1.00)
GFR, Estimated: 46 mL/min — ABNORMAL LOW (ref >60)
Glucose, Bld: 97 mg/dL (ref 70–99)
Potassium: 3.8 mmol/L (ref 3.5–5.1)
Sodium: 128 mmol/L — ABNORMAL LOW (ref 135–145)

## 2023-02-26 LAB — CBC
HCT: 30.5 % — ABNORMAL LOW (ref 36.0–46.0)
Hemoglobin: 10.3 g/dL — ABNORMAL LOW (ref 12.0–15.0)
MCH: 31.2 pg (ref 26.0–34.0)
MCHC: 33.8 g/dL (ref 30.0–36.0)
MCV: 92.4 fL (ref 80.0–100.0)
Platelets: 224 10*3/uL (ref 150–400)
RBC: 3.3 MIL/uL — ABNORMAL LOW (ref 3.87–5.11)
RDW: 14.6 % (ref 11.5–15.5)
WBC: 8 10*3/uL (ref 4.0–10.5)
nRBC: 0 % (ref 0.0–0.2)

## 2023-02-26 LAB — PHOSPHORUS: Phosphorus: 2.7 mg/dL (ref 2.5–4.6)

## 2023-02-26 LAB — BRAIN NATRIURETIC PEPTIDE: B Natriuretic Peptide: 300.2 pg/mL — ABNORMAL HIGH (ref 0.0–100.0)

## 2023-02-26 LAB — PROCALCITONIN: Procalcitonin: 0.15 ng/mL

## 2023-02-26 LAB — MAGNESIUM: Magnesium: 2.6 mg/dL — ABNORMAL HIGH (ref 1.7–2.4)

## 2023-02-26 MED ORDER — PANTOPRAZOLE SODIUM 40 MG PO TBEC
40.0000 mg | DELAYED_RELEASE_TABLET | Freq: Every day | ORAL | Status: DC
  Administered 2023-02-27 – 2023-02-28 (×2): 40 mg via ORAL
  Filled 2023-02-26 (×2): qty 1

## 2023-02-26 MED ORDER — FUROSEMIDE 10 MG/ML IJ SOLN
20.0000 mg | Freq: Once | INTRAMUSCULAR | Status: AC
  Administered 2023-02-26: 20 mg via INTRAVENOUS
  Filled 2023-02-26: qty 2

## 2023-02-26 MED ORDER — POTASSIUM CHLORIDE CRYS ER 20 MEQ PO TBCR
20.0000 meq | EXTENDED_RELEASE_TABLET | Freq: Once | ORAL | Status: AC
  Administered 2023-02-26: 20 meq via ORAL
  Filled 2023-02-26: qty 1

## 2023-02-26 MED ORDER — LACTULOSE 10 GM/15ML PO SOLN: 20.0000 g | Freq: Two times a day (BID) | ORAL | Status: DC | PRN

## 2023-02-26 MED ORDER — CYCLOBENZAPRINE HCL 5 MG PO TABS
5.0000 mg | ORAL_TABLET | Freq: Three times a day (TID) | ORAL | Status: DC | PRN
  Administered 2023-02-26 – 2023-02-27 (×3): 5 mg via ORAL
  Filled 2023-02-26 (×3): qty 1

## 2023-02-26 MED ORDER — IPRATROPIUM-ALBUTEROL 0.5-2.5 (3) MG/3ML IN SOLN
3.0000 mL | Freq: Two times a day (BID) | RESPIRATORY_TRACT | Status: DC
  Administered 2023-02-26 – 2023-02-28 (×4): 3 mL via RESPIRATORY_TRACT
  Filled 2023-02-26 (×4): qty 3

## 2023-02-26 MED ORDER — MENTHOL 3 MG MT LOZG
1.0000 | LOZENGE | OROMUCOSAL | Status: DC | PRN
  Filled 2023-02-26: qty 9

## 2023-02-26 MED ORDER — ENSURE ENLIVE PO LIQD
237.0000 mL | Freq: Two times a day (BID) | ORAL | Status: DC
  Administered 2023-02-26 – 2023-02-27 (×2): 237 mL via ORAL

## 2023-02-26 MED ORDER — DICLOFENAC SODIUM 1 % EX GEL
2.0000 g | Freq: Four times a day (QID) | CUTANEOUS | Status: DC
  Administered 2023-02-26 (×2): 2 g via TOPICAL
  Filled 2023-02-26: qty 100

## 2023-02-26 MED ORDER — PROPRANOLOL HCL 10 MG PO TABS
10.0000 mg | ORAL_TABLET | Freq: Two times a day (BID) | ORAL | Status: DC
  Administered 2023-02-26 (×2): 10 mg via ORAL
  Filled 2023-02-26 (×4): qty 1

## 2023-02-26 NOTE — Progress Notes (Signed)
 Initial Nutrition Assessment  DOCUMENTATION CODES:   Non-severe (moderate) malnutrition in context of chronic illness  INTERVENTION:  Adjust diet to dysphagia 3 diet to help with swollowing difficulties. Pending SLP eval  Ensure Enlive po BID, each supplement provides 350 kcal and 20 grams of protein. Magic cup TID with meals, each supplement provides 290 kcal and 9 grams of protein Protein containing snacks between meals TID Assistance with feeding and tray setup  NUTRITION DIAGNOSIS:   Moderate Malnutrition related to chronic illness as evidenced by severe fat depletion, moderate fat depletion, severe muscle depletion, moderate muscle depletion.  GOAL:   Patient will meet greater than or equal to 90% of their needs  MONITOR:   PO intake, Weight trends, Labs, Supplement acceptance  REASON FOR ASSESSMENT:   Consult Assessment of nutrition requirement/status  ASSESSMENT:   Pt presented to ED with diarrhea, nausea, poor po intake, progressive generalized weakness and found to have hyponatremia. Pt with PMH of HTN, HLD, CAD, severe aortic stenosis, TIA, CKD, COPD, liver cirrhosis, hypothyroidism, GERD.   1/27: s/p inguinal hernia repair   Spoke extensively with pt's daughter (Amy) on phone. Daughter expressed frustration that pt is just now seeing RD. Daughter explains that pt has seen outpatient RD in the past to help her with diet for CKD and would like a referral to an RD who could see her at her home. Daughter reports pt has not had a good appetite and is concerned for her eating her meals when pt's other daughter is not present. Pt reports she choked on her food during dinner last night due to SOB. Daughter requests assistance during meals with setup and monitoring for signs of choking. Spoke with pt's nurse in hallway who said SLP has been consulted. Pt denies any trouble chewing/swallowing besides during times when she has trouble breathing.  Pt reports her appetite has not  been good for years at home. Pt reports not eating real meals at home.Pt reports at home she does not like eating meat and tends to eat lots of fruits and vegetables, peanut butter crackers, and nuts. She reports following the BRAT diet when she experienced diarrhea this past weekend. Pt reports last whole meal she ate was last Friday and has not eaten much since. Pt ate all of her breakfast this morning. Pt reports she drinks Boost Plus at home and is agreeable to having strawberry Boost only and magic cup while she is here. Daughter requested protein recommendations for pt at home and we discussed options. Will add handouts to her discharge paperwork.  Pt reports that she lives at home with 47 year old husband who she has to help take care of.   Admit weight: 50.8 kg No weight has been recorded since 02/17/23. Pt is unsure if she has lost any weight. She reports a usual body weight of 112 lbs.  Meds: Lasix , Protonix   Labs: Glucose 97-121 (last 24 hrs), Sodium 128 (low), calcium  low, magnesium  high, A1C 5.9 (11/2022)  NUTRITION - FOCUSED PHYSICAL EXAM: Noted moderate to severe fat and muscle wasting despite pt report of adequate po intake. Malnutrition is likely multifactorial given inadequate protein intake, hx of weight loss, and multiple comorbidities   Flowsheet Row Most Recent Value  Orbital Region Moderate depletion  Upper Arm Region Severe depletion  Thoracic and Lumbar Region Unable to assess  [brace on]  Buccal Region Moderate depletion  Temple Region Moderate depletion  Clavicle Bone Region Severe depletion  Clavicle and Acromion Bone Region Severe depletion  Scapular Bone Region Severe depletion  Dorsal Hand Severe depletion  Patellar Region Moderate depletion  Anterior Thigh Region Moderate depletion  Posterior Calf Region Moderate depletion  Edema (RD Assessment) None  Hair Reviewed  Eyes Reviewed  Mouth Reviewed  Skin Reviewed  Nails Reviewed       Diet Order:    Diet Order             DIET DYS 3 Room service appropriate? Yes with Assist; Fluid consistency: Thin  Diet effective now                   EDUCATION NEEDS:   Education needs have been addressed  Skin:  Skin Assessment: Reviewed RN Assessment  Last BM:  2/3  Height:   Ht Readings from Last 1 Encounters:  02/17/23 5' (1.524 m)    Weight:   Wt Readings from Last 1 Encounters:  02/17/23 50.8 kg    Ideal Body Weight:  45.5 kg  BMI:  There is no height or weight on file to calculate BMI.  Estimated Nutritional Needs:   Kcal:  1300-1500  Protein:  65-80 g  Fluid:  >/= 1.5 L    Tinnie Ferraris, MS Dietetic Intern

## 2023-02-26 NOTE — Plan of Care (Signed)
   Problem: Education: Goal: Knowledge of General Education information will improve Description: Including pain rating scale, medication(s)/side effects and non-pharmacologic comfort measures Outcome: Progressing   Problem: Activity: Goal: Risk for activity intolerance will decrease Outcome: Progressing   Problem: Nutrition: Goal: Adequate nutrition will be maintained Outcome: Progressing

## 2023-02-26 NOTE — TOC CM/SW Note (Signed)
 Transition of Care [facility]) - Inpatient Brief Assessment   Patient Details  Name: [name] [name] MRN: [id number] Date of Birth: 1931/10/06  Transition of Care [facility]) CM/SW Contact:    Inocente GORMAN Kindle, LCSW Phone Number: 02/26/2023, 9:53 AM   Clinical Narrative: Patient admitted from home and is active with [facility]. TOC will continue to follow for needs.    Transition of Care Asessment: Insurance and Status: Insurance coverage has been reviewed Patient has primary care physician: Yes Home environment has been reviewed: From home Prior level of function:: Mod Independent Prior/Current Home Services: Current home services Social Drivers of Health Review: SDOH reviewed no interventions necessary Readmission risk has been reviewed: Yes Transition of care needs: transition of care needs identified, TOC will continue to follow

## 2023-02-26 NOTE — Consult Note (Signed)
 NAME:  [name] [name], MRN:  [id number], DOB:  Dec 17, 1931, LOS: 1 ADMISSION DATE:  02/23/2023, CONSULTATION DATE:   REFERRING MD:   CHIEF COMPLAINT:  Diarrhea   History of Present Illness:  Pt is a 67yr female with significant pmhx of COPD, CKD, TIA, liver cirrhosis, HTN, HLD, CAD,  hypothyroidism, and GERD who presented to the ED with intractable diarrhea and admitted under TRH for further management on 02/23/23. Patient began developing increase SOB on 2/5 and PCCM consulted to assist in managing.  Pertinent  Medical History   Past Medical History:  Diagnosis Date   Anemia    Arthritis    Asthma    history of   CAD (coronary artery disease)    Cirrhosis (HCC)    CKD (chronic kidney disease)    Dyspnea    sometimes sitting.lying and on exertion   Emphysema lung (HCC)    GERD (gastroesophageal reflux disease)    History of hiatal hernia    History of transcatheter aortic valve replacement (TAVR) 01/26/2019   (TAVR  23 mm Evolute Pro in aortic position 01/26/2019) .    Hyperlipidemia    Hypertension    Hypothyroidism    Lipoma    left arm   Nasal polyps    Pneumonia    Pulmonary nodule    noted on pre TAVR CT   Samter's triad    Severe aortic stenosis    TIA (transient ischemic attack)      Significant Hospital Events: Including procedures, antibiotic start and stop dates in addition to other pertinent events   2/2 Admit with intractable Diarrhea under TRH 2/5 PCCM consult for shortness of breath   Interim History / Subjective:    Objective   Blood pressure 119/70, pulse 98, temperature 97.7 F (36.5 C), temperature source Oral, resp. rate 17, SpO2 98%.        Intake/Output Summary (Last 24 hours) at 02/26/2023 1327 Last data filed at 02/26/2023 0530 Gross per 24 hour  Intake 720 ml  Output 500 ml  Net 220 ml   There were no vitals filed for this visit.  Examination: General: pleasant chronically ill older female, sitting up at beside eating HENT:  Normocephalic, teeth intact, pink MM  Lungs: clear, diminished in lower bases, IS-750cc obtained  Cardiovascular: s1,s2, RRR, no JVD, no MRG Abdomen: BS active, soft, abdominal binder Extremities: moves all extremities, weak  Neuro: alert, follows commands  GU: deferred   Resolved Hospital Problem list   N/a  Assessment & Plan:   Shortness of Breath secondary to left pleural effusion vs pneumonia  COPD hx  Patient in no distress, O2 sats 98%  Beside ultrasound-small left pleural effusion, atelectasis  Patient is deconditioned  P: O2 Sat goal >88%  Continue PRN albuterol  nebs Q6hr Continue Brovana  neb, ellipta, and pulmicort  as ordered  Continue IS and Flutter valve  Up to chair, and mobilize with PT/OT  Patient would benefit from inpatient rehab and patient is willing if insurance approves, if inpatient rehab is not an option-recommend SNF/rehab  Follow up with pulmonary outpatient within 2 weeks of hospital discharge  Recommend RD following for nutrition requirements as well   Problem list Diarrhea Recent inguinal hernia repair (02/17/23)  Hypovolemic Hyponatremia  Hypokalemia Hypomagnesemia  CKD Stage III B CAD Severe Aortic Stenosis with TAVR (2021)  Liver Cirrhosis Portal HTN Hypothyroidism    Best Practice (per primary)     Labs   CBC: Recent Labs  Lab 02/23/23  1357 02/25/23 0423 02/26/23 0445  WBC 13.8* 7.7 8.0  NEUTROABS 10.6*  --   --   HGB 11.8* 9.9* 10.3*  HCT 33.8* 29.2* 30.5*  MCV 90.1 91.8 92.4  PLT 299 224 224    Basic Metabolic Panel: Recent Labs  Lab 02/23/23 1357 02/24/23 0052 02/24/23 0637 02/25/23 0423 02/26/23 0445  NA 122* 122* 125* 131* 128*  K 3.5 3.4* 3.6 3.1* 3.8  CL 90* 93* 95* 100 99  CO2 23 21* 20* 21* 18*  GLUCOSE 110* 101* 97 121* 97  BUN 17 18 17 13 11   CREATININE 0.92 0.89 0.89 1.10* 1.15*  CALCIUM  8.6* 7.7* 8.1* 7.5* 8.0*  MG 1.7  --   --  1.6* 2.6*  PHOS  --   --   --  2.1* 2.7   GFR: Estimated Creatinine  Clearance: 23.8 mL/min (A) (by C-G formula based on SCr of 1.15 mg/dL (H)). Recent Labs  Lab 02/23/23 1357 02/23/23 1715 02/25/23 0423 02/26/23 0445  PROCALCITON  --   --  0.20 0.15  WBC 13.8*  --  7.7 8.0  LATICACIDVEN 1.5 1.5  --   --     Liver Function Tests: Recent Labs  Lab 02/23/23 1357  AST 41  ALT 33  ALKPHOS 315*  BILITOT 1.2  PROT 7.0  ALBUMIN  3.3*   Recent Labs  Lab 02/23/23 1357  LIPASE 13   No results for input(s): AMMONIA in the last 168 hours.  ABG    Component Value Date/Time   PHART 7.404 01/21/2019 1020   PCO2ART 39.5 01/21/2019 1020   PO2ART 132 (H) 01/21/2019 1020   HCO3 24.2 01/21/2019 1020   TCO2 26 09/22/2022 1607   ACIDBASEDEF 3.0 (H) 12/15/2018 1128   O2SAT 99.3 01/21/2019 1020     Coagulation Profile: No results for input(s): INR, PROTIME in the last 168 hours.  Cardiac Enzymes: No results for input(s): CKTOTAL, CKMB, CKMBINDEX, TROPONINI in the last 168 hours.  HbA1C: Hgb A1c MFr Bld  Date/Time Value Ref Range Status  11/28/2022 11:31 AM 5.9 (H) 4.8 - 5.6 % Final    Comment:             Prediabetes: 5.7 - 6.4          Diabetes: >6.4          Glycemic control for adults with diabetes: <7.0   09/23/2022 02:24 AM 6.2 (H) 4.8 - 5.6 % Final    Comment:    (NOTE) Pre diabetes:          5.7%-6.4%  Diabetes:              >6.4%  Glycemic control for   <7.0% adults with diabetes     CBG: No results for input(s): GLUCAP in the last 168 hours.  Review of Systems:   See HPI   Past Medical History:  She,  has a past medical history of Anemia, Arthritis, Asthma, CAD (coronary artery disease), Cirrhosis (HCC), CKD (chronic kidney disease), Dyspnea, Emphysema lung (HCC), GERD (gastroesophageal reflux disease), History of hiatal hernia, History of transcatheter aortic valve replacement (TAVR) (01/26/2019), Hyperlipidemia, Hypertension, Hypothyroidism, Lipoma, Nasal polyps, Pneumonia, Pulmonary nodule, Samter's triad,  Severe aortic stenosis, and TIA (transient ischemic attack).   Surgical History:   Past Surgical History:  Procedure Laterality Date   CHOLECYSTECTOMY  02/27/2004   CORONARY ATHERECTOMY N/A 01/05/2019   Procedure: CORONARY ATHERECTOMY;  Surgeon: Ladona Heinz, MD;  Location: [facility] INVASIVE CV LAB;  Service: Cardiovascular;  Laterality:  N/A;   CORONARY BALLOON ANGIOPLASTY N/A 01/05/2019   Procedure: CORONARY BALLOON ANGIOPLASTY;  Surgeon: Ladona Heinz, MD;  Location: [facility] INVASIVE CV LAB;  Service: Cardiovascular;  Laterality: N/A;   CORONARY STENT INTERVENTION N/A 01/05/2019   Procedure: CORONARY STENT INTERVENTION;  Surgeon: Ladona Heinz, MD;  Location: [facility] INVASIVE CV LAB;  Service: Cardiovascular;  Laterality: N/A;   CORONARY STENT INTERVENTION  01/05/2019   INCONTINENCE SURGERY  09/12/2009   INGUINAL HERNIA REPAIR Right 02/17/2023   Procedure: LAPAROSCOPIC RIGHT INGUINAL HERNIA REPAIR WITH MESH;  Surgeon: Kinsinger, Herlene Righter, MD;  Location: [facility] ORS;  Service: General;  Laterality: Right;   NASAL SINUS SURGERY     several     ORIF ANKLE FRACTURE Right 09/11/2013   Procedure: OPEN REDUCTION INTERNAL FIXATION (ORIF) ANKLE FRACTURE;  Surgeon: Elspeth JONELLE Her, MD;  Location: [facility] ORS;  Service: Orthopedics;  Laterality: Right;   RIGHT/LEFT HEART CATH AND CORONARY ANGIOGRAPHY N/A 12/15/2018   Procedure: RIGHT/LEFT HEART CATH AND CORONARY ANGIOGRAPHY;  Surgeon: Ladona Heinz, MD;  Location: [facility] INVASIVE CV LAB;  Service: Cardiovascular;  Laterality: N/A;   ROTATOR CUFF REPAIR  09/27/2003   SINUS ENDO WITH FUSION Bilateral 09/08/2020   Procedure: Bilateral Revision of Endoscopic Sinus Surgery with Fusion and Intranasal Polypectomy;  Surgeon: Mable Lenis, MD;  Location: [facility] OR;  Service: ENT;  Laterality: Bilateral;   TRANSCATHETER AORTIC VALVE REPLACEMENT, TRANSFEMORAL  01/26/2019   TRANSCATHETER AORTIC VALVE REPLACEMENT, TRANSFEMORAL N/A 01/26/2019   Procedure: TRANSCATHETER AORTIC VALVE REPLACEMENT,  TRANSFEMORAL;  Surgeon: Verlin Lonni BIRCH, MD;  Location: [facility] OR;  Service: Open Heart Surgery;  Laterality: N/A;   TRIGGER FINGER RELEASE  10/29/2006   UMBILICAL HERNIA REPAIR N/A 02/17/2023   Procedure: PRIMARY UMBILICAL HERNIA REPAIR;  Surgeon: Stevie, Herlene Righter, MD;  Location: [facility] ORS;  Service: General;  Laterality: N/A;     Social History:   reports that she has never smoked. She has never used smokeless tobacco. She reports that she does not drink alcohol  and does not use drugs.   Family History:  Her family history includes Asthma in her maternal grandfather; Brain cancer in her daughter; Breast cancer in her daughter; Cancer in her maternal grandmother; Dementia in her sister; Heart attack in her brother; Heart disease in her brother; Other in her father; Sarcoidosis in her sister; Tuberculosis in her mother.   Allergies Allergies  Allergen Reactions   Aspirin  Anaphylaxis   Bee Venom Anaphylaxis   Ceclor [Cefaclor] Anaphylaxis   Simvastatin  Other (See Comments)    Hair loss   Pneumococcal Vac Polyvalent Swelling    Other reaction(s): swelling and erythema   Statins Other (See Comments)    Elevated liver enzymes   Crestor  [Rosuvastatin ] Rash   Sulfa Antibiotics Rash     Home Medications  Prior to Admission medications   Medication Sig Start Date End Date Taking? Authorizing Provider  Artificial Saliva (BIOTENE DRY MOUTH) LOZG Use as directed 1 Piece in the mouth or throat as needed (dry mouth).   Yes [provider]  atorvastatin  (LIPITOR) 20 MG tablet Take 1 tablet (20 mg total) by mouth daily. 12/25/22  Yes Ladona Heinz, MD  clopidogrel  (PLAVIX ) 75 MG tablet TAKE 1 TABLET BY MOUTH EVERY DAY 11/25/22  Yes Ladona Heinz, MD  diphenhydrAMINE  (BENADRYL ) 25 mg capsule Take 25 mg by mouth every 6 (six) hours as needed for allergies.   Yes [provider]  fexofenadine (ALLEGRA) 180 MG tablet Take 180 mg by mouth daily as needed for allergies  or rhinitis.    Yes [provider]  fluticasone  (FLONASE ) 50 MCG/ACT nasal spray Place 2 sprays into both nostrils at bedtime.   Yes [provider]  furosemide  (LASIX ) 20 MG tablet Take 1 tablet (20 mg total) by mouth as needed. Patient taking differently: Take 20 mg by mouth every Monday, Wednesday, and Friday. 07/05/22  Yes Ladona Heinz, MD  HYDROcodone -acetaminophen  (NORCO/VICODIN) 5-325 MG tablet Take 1-2 tablets by mouth every 6 (six) hours as needed for moderate pain (pain score 4-6). 02/22/23 02/27/23 Yes [provider]  levothyroxine  (SYNTHROID ) 50 MCG tablet Take 50 mcg by mouth daily before breakfast. In am   Yes [provider]  montelukast  (SINGULAIR ) 10 MG tablet Take 1 tablet (10 mg total) by mouth at bedtime. 10/29/22  Yes Kassie Acquanetta Bradley, MD  ondansetron  (ZOFRAN -ODT) 4 MG disintegrating tablet Take 4 mg by mouth every 8 (eight) hours as needed for nausea. 02/22/23 03/01/23 Yes [provider]  Polyethyl Glycol-Propyl Glycol (SYSTANE) 0.4-0.3 % SOLN Place 1 drop into both eyes daily as needed (dry/irritated eyes.).   Yes [provider]  potassium chloride  (KLOR-CON ) 10 MEQ tablet Take 1 tablet (10 mEq total) by mouth daily. Patient taking differently: Take 10 mEq by mouth every Monday, Wednesday, and Friday. 05/12/20  Yes Cantwell, Celeste C, PA-C  propranolol  (INDERAL ) 20 MG tablet Take 1 tablet (20 mg total) by mouth 3 (three) times daily. Patient taking differently: Take 20 mg by mouth 2 (two) times daily. 02/03/23  Yes Ladona Heinz, MD  sodium chloride  (OCEAN) 0.65 % nasal spray Place 1 spray into the nose every other day.   Yes [provider]  Tiotropium Bromide-Olodaterol (STIOLTO RESPIMAT ) 2.5-2.5 MCG/ACT AERS Inhale 2 puffs into the lungs daily. 10/29/22  Yes Kassie Acquanetta Bradley, MD  albuterol  (VENTOLIN  HFA) 108 [phone] Base) MCG/ACT inhaler Inhale 2 puffs into the lungs every 6 (six) hours as needed for shortness of breath (use as needed or before  activity for shortness of breath or whezzing). 02/24/23   Kassie Acquanetta Bradley, MD  cyclobenzaprine  (FLEXERIL ) 5 MG tablet Take 5 mg by mouth 3 (three) times daily as needed for muscle spasms. Patient not taking: Reported on 02/24/2023 02/22/23 03/04/23  [provider]  HYDROcodone -acetaminophen  (NORCO/VICODIN) 5-325 MG tablet Take 1 tablet by mouth every 6 (six) hours as needed for moderate pain (pain score 4-6). Patient not taking: Reported on 02/24/2023 02/18/23   Kinsinger, Herlene Righter, MD  Multiple Vitamin (MULTIVITAMIN WITH MINERALS) TABS tablet Take 1 tablet by mouth every other day. Patient not taking: Reported on 02/24/2023    [provider]  Peak Flow Meter DEVI 1 puff by Does not apply route in the morning and at bedtime. 05/12/20   Kassie Acquanetta Bradley, MD     Critical care time: 45 mins     Christian Alejandro Gamel AGACNP-BC   [facility] 02/26/2023, 4:30 PM  Please see [email or web address] for pager details.  From 7A-7P if no response, please call [phone]. After hours, please call ELink [phone].

## 2023-02-26 NOTE — Progress Notes (Signed)
   02/26/23 0914  Mobility  Activity Transferred from bed to chair  Level of Assistance Standby assist, set-up cues, supervision of patient - no hands on  Assistive Device Front wheel walker  Activity Response Tolerated fair  Mobility Referral Yes  Mobility visit 1 Mobility  Mobility Specialist Start Time (ACUTE ONLY) 0851  Mobility Specialist Stop Time (ACUTE ONLY) 0914  Mobility Specialist Time Calculation (min) (ACUTE ONLY) 23 min   Mobility Specialist: Progress Note  Pre-Mobility:      HR 100,  SpO2 98 RA% Post-Mobility:    HR 133, SpO2 N/A%  Pt agreeable to mobility session - received in bed. C/o shakiness post mobility. Returned to chair with all needs met - call bell within reach. Chair alarm on. Not good pleth reading EOS d/t pt eating breakfast.   Virgle Boards, BS Mobility Specialist Please contact via SecureChat or  Rehab office at [phone].

## 2023-02-26 NOTE — Progress Notes (Signed)
      Chief Complaint/Subjective: Diarrhea resolved, getting full quickly when eating, pain in lower abdomen and lower back  Objective: Vital signs in last 24 hours: Temp:  [97.5 F (36.4 C)-98.4 F (36.9 C)] 97.6 F (36.4 C) (02/05 0802) Pulse Rate:  [95-100] 95 (02/05 0419) Resp:  [14-23] 14 (02/05 0802) BP: (100-146)/(56-82) 113/66 (02/05 0802) SpO2:  [96 %-98 %] 97 % (02/05 0802) Last BM Date : 02/24/23 Intake/Output from previous day: 02/04 0701 - 02/05 0700 In: 720 [P.O.:720] Out: 500 [Urine:500]  PE: Gen: NAD Resp: nonlabored Card: RRR Abd: incisions c/d/I, small ecchymosis right medial groin  Lab Results:  Recent Labs    02/25/23 0423 02/26/23 0445  WBC 7.7 8.0  HGB 9.9* 10.3*  HCT 29.2* 30.5*  PLT 224 224   Recent Labs    02/25/23 0423 02/26/23 0445  NA 131* 128*  K 3.1* 3.8  CL 100 99  CO2 21* 18*  GLUCOSE 121* 97  BUN 13 11  CREATININE 1.10* 1.15*  CALCIUM  7.5* 8.0*   No results for input(s): LABPROT, INR in the last 72 hours.    Component Value Date/Time   NA 128 (L) 02/26/2023 0445   NA 132 (L) 07/26/2021 1222   K 3.8 02/26/2023 0445   CL 99 02/26/2023 0445   CO2 18 (L) 02/26/2023 0445   GLUCOSE 97 02/26/2023 0445   BUN 11 02/26/2023 0445   BUN 27 07/26/2021 1222   CREATININE 1.15 (H) 02/26/2023 0445   CREATININE 1.52 (H) 11/23/2021 1435   CALCIUM  8.0 (L) 02/26/2023 0445   PROT 7.0 02/23/2023 1357   PROT 7.7 12/24/2021 1555   ALBUMIN  3.3 (L) 02/23/2023 1357   ALBUMIN  4.4 12/24/2021 1555   AST 41 02/23/2023 1357   AST 75 (H) 11/23/2021 1435   ALT 33 02/23/2023 1357   ALT 43 11/23/2021 1435   ALKPHOS 315 (H) 02/23/2023 1357   BILITOT 1.2 02/23/2023 1357   BILITOT 0.8 12/24/2021 1555   BILITOT 0.6 11/23/2021 1435   GFRNONAA 46 (L) 02/26/2023 0445   GFRNONAA 33 (L) 11/23/2021 1435   GFRAA 41 (L) 11/30/2019 1007    Assessment/Plan  s/p  robotic right inguinal hernia and umbilical hernia repair. Multiple medical problems,  readmitted for dehydration/diarrhea.  Hyponatremia - improving some some fluid restriction Restrictive lung disease- seem fine on room air, IS apparently less than last week CHF - trace edema, blood pressure normal, Cr slightly above baseline Post op pain - transitioned to oxycodone  and acetaminophen  from norco  FEN - heart healthy diet VTE - enoxaparin  40 mg daily ID - flagyl for proctitis Disposition - PT, will add binder for pain, family concerned about nutrition intake and patient's ability to move.   LOS: 1 day   I reviewed last 24 h vitals and pain scores, last 48 h intake and output, last 24 h labs and trends, and last 24 h imaging results.  This care required moderate level of medical decision making.   Herlene Righter [facility] 02/26/2023, 9:08 AM Please see Amion for pager number during day hours 7:00am-4:30pm or 7:00am -11:30am on weekends

## 2023-02-26 NOTE — Evaluation (Signed)
 Clinical/Bedside Swallow Evaluation Patient Details  Name: [name] [name] MRN: [id number] Date of Birth: 1931-04-09  Today's Date: 02/26/2023 Time: SLP Start Time (ACUTE ONLY): 1427 SLP Stop Time (ACUTE ONLY): 1451 SLP Time Calculation (min) (ACUTE ONLY): 24 min  Past Medical History:  Past Medical History:  Diagnosis Date   Anemia    Arthritis    Asthma    history of   CAD (coronary artery disease)    Cirrhosis (HCC)    CKD (chronic kidney disease)    Dyspnea    sometimes sitting.lying and on exertion   Emphysema lung (HCC)    GERD (gastroesophageal reflux disease)    History of hiatal hernia    History of transcatheter aortic valve replacement (TAVR) 01/26/2019   (TAVR  23 mm Evolute Pro in aortic position 01/26/2019) .    Hyperlipidemia    Hypertension    Hypothyroidism    Lipoma    left arm   Nasal polyps    Pneumonia    Pulmonary nodule    noted on pre TAVR CT   Samter's triad    Severe aortic stenosis    TIA (transient ischemic attack)    Past Surgical History:  Past Surgical History:  Procedure Laterality Date   CHOLECYSTECTOMY  02/27/2004   CORONARY ATHERECTOMY N/A 01/05/2019   Procedure: CORONARY ATHERECTOMY;  Surgeon: Ladona Heinz, MD;  Location: [facility] INVASIVE CV LAB;  Service: Cardiovascular;  Laterality: N/A;   CORONARY BALLOON ANGIOPLASTY N/A 01/05/2019   Procedure: CORONARY BALLOON ANGIOPLASTY;  Surgeon: Ladona Heinz, MD;  Location: [facility] INVASIVE CV LAB;  Service: Cardiovascular;  Laterality: N/A;   CORONARY STENT INTERVENTION N/A 01/05/2019   Procedure: CORONARY STENT INTERVENTION;  Surgeon: Ladona Heinz, MD;  Location: [facility] INVASIVE CV LAB;  Service: Cardiovascular;  Laterality: N/A;   CORONARY STENT INTERVENTION  01/05/2019   INCONTINENCE SURGERY  09/12/2009   INGUINAL HERNIA REPAIR Right 02/17/2023   Procedure: LAPAROSCOPIC RIGHT INGUINAL HERNIA REPAIR WITH MESH;  Surgeon: Kinsinger, Herlene Righter, MD;  Location: [facility];  Service: General;  Laterality: Right;    NASAL SINUS SURGERY     several     ORIF ANKLE FRACTURE Right 09/11/2013   Procedure: OPEN REDUCTION INTERNAL FIXATION (ORIF) ANKLE FRACTURE;  Surgeon: Elspeth JONELLE Her, MD;  Location: [facility];  Service: Orthopedics;  Laterality: Right;   RIGHT/LEFT HEART CATH AND CORONARY ANGIOGRAPHY N/A 12/15/2018   Procedure: RIGHT/LEFT HEART CATH AND CORONARY ANGIOGRAPHY;  Surgeon: Ladona Heinz, MD;  Location: [facility] INVASIVE CV LAB;  Service: Cardiovascular;  Laterality: N/A;   ROTATOR CUFF REPAIR  09/27/2003   SINUS ENDO WITH FUSION Bilateral 09/08/2020   Procedure: Bilateral Revision of Endoscopic Sinus Surgery with Fusion and Intranasal Polypectomy;  Surgeon: Mable Lenis, MD;  Location: [facility] OR;  Service: ENT;  Laterality: Bilateral;   TRANSCATHETER AORTIC VALVE REPLACEMENT, TRANSFEMORAL  01/26/2019   TRANSCATHETER AORTIC VALVE REPLACEMENT, TRANSFEMORAL N/A 01/26/2019   Procedure: TRANSCATHETER AORTIC VALVE REPLACEMENT, TRANSFEMORAL;  Surgeon: Verlin Lonni BIRCH, MD;  Location: [facility] OR;  Service: Open Heart Surgery;  Laterality: N/A;   TRIGGER FINGER RELEASE  10/29/2006   UMBILICAL HERNIA REPAIR N/A 02/17/2023   Procedure: PRIMARY UMBILICAL HERNIA REPAIR;  Surgeon: Stevie, Herlene Righter, MD;  Location: [facility] OR[facility];  Service: General;  Laterality: N/A;   HPI:  IVANIA TEAGARDEN is an 89 yo female presenting to ED 2/3 with intractable diarrhea. CT 2/2 demonstrates new small pleural effusion, reticulonodular opacity posteriorly in the RUL, aortic atherosclerosis. Recent laparoscopic R inguinal with mesh-umbilical  hernia repair 1/27. PMH includes HTN, HLD, aortic stenosis, COPD, liver cirrhosis, hypothyroidism    Assessment / Plan / Recommendation  Clinical Impression  SLP consulted due to pt's family observing increased WOB while eating. Pt states that she experiences frequent eructation and occasional globus sensation with exceptionally large pills, which may be indicative of an esophageal component. Observed trials  of thin liquids, purees, and solids without overt s/s of dysphagia or aspiration. Her vital signs remained stable and she required seldom breaks with reported shortness of breath. Education was reviewed regarding the coordination of swallowing and breathing with pt and her daughters. Recommend continuing diet of Dys 3 solids and thin liquids to reduce effort required for mastication. Feel that pt can upgrade to regular solids when she feels comfortable doing so. SLP will s/o at this time. SLP Visit Diagnosis: Dysphagia, unspecified (R13.10)    Aspiration Risk  Mild aspiration risk    Diet Recommendation Dysphagia 3 (Mech soft);Thin liquid    Liquid Administration via: Cup;Straw Medication Administration: Whole meds with liquid Supervision: Patient able to self feed;Full supervision/cueing for compensatory strategies Compensations: Minimize environmental distractions;Slow rate;Small sips/bites Postural Changes: Seated upright at 90 degrees;Remain upright for at least 30 minutes after po intake    Other  Recommendations Oral Care Recommendations: Oral care BID    Recommendations for follow up therapy are one component of a multi-disciplinary discharge planning process, led by the attending physician.  Recommendations may be updated based on patient status, additional functional criteria and insurance authorization.  Follow up Recommendations No SLP follow up      Assistance Recommended at Discharge    Functional Status Assessment Patient has not had a recent decline in their functional status  Frequency and Duration            Prognosis Prognosis for improved oropharyngeal function: Good Barriers to Reach Goals: Time post onset      Swallow Study   General HPI: [name] [name] is an 89 yo female presenting to ED 2/3 with intractable diarrhea. CT 2/2 demonstrates new small pleural effusion, reticulonodular opacity posteriorly in the RUL, aortic atherosclerosis. Recent laparoscopic R  inguinal with mesh-umbilical hernia repair 1/27. PMH includes HTN, HLD, aortic stenosis, COPD, liver cirrhosis, hypothyroidism Type of Study: Bedside Swallow Evaluation Previous Swallow Assessment: none in chart Diet Prior to this Study: Dysphagia 3 (mechanical soft);Thin liquids (Level 0) Temperature Spikes Noted: No Respiratory Status: Room air History of Recent Intubation: No Behavior/Cognition: Alert;Cooperative;Pleasant mood Oral Cavity Assessment: Dry Oral Care Completed by SLP: No Oral Cavity - Dentition: Adequate natural dentition Vision: Functional for self-feeding Self-Feeding Abilities: Able to feed self Patient Positioning: Upright in bed Baseline Vocal Quality: Normal Volitional Cough: Strong Volitional Swallow: Able to elicit    Oral/Motor/Sensory Function Overall Oral Motor/Sensory Function: Within functional limits   Ice Chips Ice chips: Not tested   Thin Liquid Thin Liquid: Within functional limits Presentation: Straw;Self Fed    Nectar Thick Nectar Thick Liquid: Not tested   Honey Thick Honey Thick Liquid: Not tested   Puree Puree: Within functional limits Presentation: Spoon;Self Fed   Solid     Solid: Within functional limits Presentation: Spoon;Self Fed      Damien Blumenthal, M.A., CF-SLP Speech Language Pathology, Acute Rehabilitation Services  Secure Chat preferred [phone]  02/26/2023,3:08 PM

## 2023-02-26 NOTE — Progress Notes (Signed)
 PROGRESS NOTE        PATIENT DETAILS Name: [name] [name] Age: 89 y.o. Sex: female Date of Birth: May 14, 1931 Admit Date: 02/23/2023 Admitting Physician Marsa KATHEE Scurry, MD ERE:[name], Velna SAUNDERS, MD  Brief Summary: Patient is a 89 y.o.  female with history of HTN, HLD, aortic stenosis, COPD, liver cirrhosis, hypothyroidism-who presented to the ED with intractable diarrhea.  She was subsequently admitted to the hospitalist service.  Significant events: 1/27>> laparoscopic right inguinal hernia repair with mesh-umbilical hernia repair 2/02>> to ED with diarrhea-admit to [facility].  Significant studies: 2/02>> CT chest/abdomen/pelvis: New small pleural effusion-atelectasis-possible changes consistent with bronchiolitis-cirrhosis-postoperative findings from recent umbilical hernia and right inguinal hernia repair.  Significant microbiology data: 2/2>> stool C. difficile studies: Negative 2/2>> stool GI pathogen panel: Negative 2/2>> COVID/influenza/RSV PCR: Negative  Procedures: None  Consults: None  Subjective: Claims she could not get sleep last night-continues to have postop pain in her abdomen-some pain in her lower back.  Some dry mouth.  Thinks her legs are puffy.  No cough-no other complaints.  Appears very comfortable.  Objective: Vitals: Blood pressure 119/70, pulse 98, temperature 97.7 F (36.5 C), temperature source Oral, resp. rate 17, SpO2 98%.   Exam: Gen Exam:Alert awake-not in any distress HEENT:atraumatic, normocephalic Chest: B/L clear to auscultation anteriorly CVS:S1S2 regular Abdomen: Soft-incision/laparoscopic port sites-benign appearing-mildly tender but no peritoneal signs. Extremities:no edema Neurology: Non focal Skin: no rash  Pertinent Labs/Radiology:    Latest Ref Rng & Units 02/26/2023    4:45 AM 02/25/2023    4:23 AM 02/23/2023    1:57 PM  CBC  WBC 4.0 - 10.5 K/uL 8.0  7.7  13.8   Hemoglobin 12.0 - 15.0 g/dL 89.6  9.9   88.1   Hematocrit 36.0 - 46.0 % 30.5  29.2  33.8   Platelets 150 - 400 K/uL 224  224  299     Lab Results  Component Value Date   NA 128 (L) 02/26/2023   K 3.8 02/26/2023   CL 99 02/26/2023   CO2 18 (L) 02/26/2023      Assessment/Plan: Diarrhea Likely viral etiology-her daughter has similar symptoms Stool studies are negative Diarrhea has resolved CT imaging with possible colitis/proctitis-I suspect this is related to her diarrheal illness. Continue as needed Imodium . Advance to regular diet today  Hypovolemic hyponatremia Improved-with IVF Slight drop in sodium level to 128 today-encourage oral intake-volume status stable-restart diuretics. Follow  Hypokalemia Secondary to GI loss Repleted.  Hypomagnesemia Repleted.  Hypophosphatemia Pleated.  ?  PNA/bronchiolitis Here with diarrhea-does not have any major respiratory issues-no symptoms suggestive of pneumonia-this is mostly an incidental finding on CT imaging. At family's request-PCCM to see later today.  CKD stage IIIb At baseline  CAD s/p PCI 2020 No anginal symptoms Continue Plavix   History of severe AS-s/p TAVR 2021 Supportive care  COPD Not in exacerbation Continue bronchodilators  Liver cirrhosis/portal hypertension Stable/compensated Since BP-volume status now stable (claims her ankles are puffy-but no obvious edema)-resume diuretics-1 dose of IV Lasix  today-apparently normally takes diuretics every other day at home.Suspect can restart home dose starting tomorrow Resume propranolol  at half of home dose.  Plan is to slowly uptitrate to home dose over next day or so.  Hypothyroidism Synthroid   Recent/inguinal hernia repair-with postoperative pain Benign abdominal exam-CT scan on admission negative for any significant issues Continues to have postoperative pain-somewhat  disproportionate  Continue as needed oxycodone  Continue to mobilize General  Following.  Debility/deconditioning Secondary to diarrhea-recent hernia repair PT/OT eval-Home health recommended.  BMI: Estimated body mass index is 21.87 kg/m as calculated from the following:   Height as of 02/17/23: 5' (1.524 m).   Weight as of 02/17/23: 50.8 kg.   Code status:   Code Status: Limited: Do not attempt resuscitation (DNR) -DNR-LIMITED -Do Not Intubate/DNI    DVT Prophylaxis: enoxaparin  (LOVENOX ) injection 40 mg Start: 02/24/23 1600   Family Communication: Met daughter in the hallway on 2/5  Disposition Plan: Status is: Observation The patient will require care spanning > 2 midnights and should be moved to inpatient because: Severity of illness   Planned Discharge Destination:Home health   Diet: Diet Order             DIET DYS 3 Room service appropriate? Yes with Assist; Fluid consistency: Thin  Diet effective now                     Antimicrobial agents: Anti-infectives (From admission, onward)    Start     Dose/Rate Route Frequency Ordered Stop   02/23/23 1700  doxycycline  (VIBRA -TABS) tablet 100 mg        100 mg Oral  Once 02/23/23 1653 02/23/23 1714        MEDICATIONS: Scheduled Meds:  alum & mag hydroxide-simeth  15 mL Oral TID AC & HS   arformoterol   15 mcg Nebulization BID   And   umeclidinium bromide   1 puff Inhalation Daily   budesonide  (PULMICORT ) nebulizer solution  0.25 mg Nebulization BID   clopidogrel   75 mg Oral Daily   enoxaparin  (LOVENOX ) injection  40 mg Subcutaneous Q24H   feeding supplement  237 mL Oral BID BM   ipratropium-albuterol   3 mL Nebulization BID   levothyroxine   50 mcg Oral QAC breakfast   montelukast   10 mg Oral QHS   [START ON 02/27/2023] pantoprazole   40 mg Oral Daily   propranolol   10 mg Oral BID   Continuous Infusions: PRN Meds:.acetaminophen  **OR** acetaminophen , albuterol , cyclobenzaprine , hydrALAZINE , loperamide , menthol -cetylpyridinium, oxyCODONE , phenol   I have personally reviewed following  labs and imaging studies  LABORATORY DATA: CBC: Recent Labs  Lab 02/23/23 1357 02/25/23 0423 02/26/23 0445  WBC 13.8* 7.7 8.0  NEUTROABS 10.6*  --   --   HGB 11.8* 9.9* 10.3*  HCT 33.8* 29.2* 30.5*  MCV 90.1 91.8 92.4  PLT 299 224 224    Basic Metabolic Panel: Recent Labs  Lab 02/23/23 1357 02/24/23 0052 02/24/23 0637 02/25/23 0423 02/26/23 0445  NA 122* 122* 125* 131* 128*  K 3.5 3.4* 3.6 3.1* 3.8  CL 90* 93* 95* 100 99  CO2 23 21* 20* 21* 18*  GLUCOSE 110* 101* 97 121* 97  BUN 17 18 17 13 11   CREATININE 0.92 0.89 0.89 1.10* 1.15*  CALCIUM  8.6* 7.7* 8.1* 7.5* 8.0*  MG 1.7  --   --  1.6* 2.6*  PHOS  --   --   --  2.1* 2.7    GFR: Estimated Creatinine Clearance: 23.8 mL/min (A) (by C-G formula based on SCr of 1.15 mg/dL (H)).  Liver Function Tests: Recent Labs  Lab 02/23/23 1357  AST 41  ALT 33  ALKPHOS 315*  BILITOT 1.2  PROT 7.0  ALBUMIN  3.3*   Recent Labs  Lab 02/23/23 1357  LIPASE 13   No results for input(s): AMMONIA in the last 168 hours.  Coagulation Profile:  No results for input(s): INR, PROTIME in the last 168 hours.  Cardiac Enzymes: No results for input(s): CKTOTAL, CKMB, CKMBINDEX, TROPONINI in the last 168 hours.  BNP (last 3 results) No results for input(s): PROBNP in the last 8760 hours.  Lipid Profile: No results for input(s): CHOL, HDL, LDLCALC, TRIG, CHOLHDL, LDLDIRECT in the last 72 hours.  Thyroid  Function Tests: Recent Labs    02/24/23 0601  TSH 5.712*    Anemia Panel: No results for input(s): VITAMINB12, FOLATE, FERRITIN, TIBC, IRON, RETICCTPCT in the last 72 hours.  Urine analysis:    Component Value Date/Time   COLORURINE STRAW (A) 02/26/2023 0619   APPEARANCEUR CLEAR 02/26/2023 0619   LABSPEC 1.003 (L) 02/26/2023 0619   PHURINE 6.0 02/26/2023 0619   GLUCOSEU NEGATIVE 02/26/2023 0619   HGBUR NEGATIVE 02/26/2023 0619   BILIRUBINUR NEGATIVE 02/26/2023 0619   KETONESUR  NEGATIVE 02/26/2023 0619   PROTEINUR NEGATIVE 02/26/2023 0619   UROBILINOGEN 0.2 08/23/2018 1044   NITRITE NEGATIVE 02/26/2023 0619   LEUKOCYTESUR TRACE (A) 02/26/2023 0619    Sepsis Labs: Lactic Acid, Venous    Component Value Date/Time   LATICACIDVEN 1.5 02/23/2023 1715    MICROBIOLOGY: Recent Results (from the past 240 hours)  Resp panel by RT-PCR (RSV, Flu A&B, Covid) Anterior Nasal Swab     Status: None   Collection Time: 02/23/23  1:57 PM   Specimen: Anterior Nasal Swab  Result Value Ref Range Status   SARS Coronavirus 2 by RT PCR NEGATIVE NEGATIVE Final    Comment: (NOTE) SARS-CoV-2 target nucleic acids are NOT DETECTED.  The SARS-CoV-2 RNA is generally detectable in upper respiratory specimens during the acute phase of infection. The lowest concentration of SARS-CoV-2 viral copies this assay can detect is 138 copies/mL. A negative result does not preclude SARS-Cov-2 infection and should not be used as the sole basis for treatment or other patient management decisions. A negative result may occur with  improper specimen collection/handling, submission of specimen other than nasopharyngeal swab, presence of viral mutation(s) within the areas targeted by this assay, and inadequate number of viral copies(<138 copies/mL). A negative result must be combined with clinical observations, patient history, and epidemiological information. The expected result is Negative.  Fact Sheet for Patients:  bloggercourse.com  Fact Sheet for Healthcare Providers:  seriousbroker.it  This test is no t yet approved or cleared by the [place]  FDA and  has been authorized for detection and/or diagnosis of SARS-CoV-2 by FDA under an Emergency Use Authorization (EUA). This EUA will remain  in effect (meaning this test can be used) for the duration of the COVID-19 declaration under Section 564(b)(1) of the Act, 21 U.S.C.section  360bbb-3(b)(1), unless the authorization is terminated  or revoked sooner.       Influenza A by PCR NEGATIVE NEGATIVE Final   Influenza B by PCR NEGATIVE NEGATIVE Final    Comment: (NOTE) The Xpert Xpress SARS-CoV-2/FLU/RSV plus assay is intended as an aid in the diagnosis of influenza from Nasopharyngeal swab specimens and should not be used as a sole basis for treatment. Nasal washings and aspirates are unacceptable for Xpert Xpress SARS-CoV-2/FLU/RSV testing.  Fact Sheet for Patients: bloggercourse.com  Fact Sheet for Healthcare Providers: seriousbroker.it  This test is not yet approved or cleared by the [place]  FDA and has been authorized for detection and/or diagnosis of SARS-CoV-2 by FDA under an Emergency Use Authorization (EUA). This EUA will remain in effect (meaning this test can be used) for the duration of the COVID-19  declaration under Section 564(b)(1) of the Act, 21 U.S.C. section 360bbb-3(b)(1), unless the authorization is terminated or revoked.     Resp Syncytial Virus by PCR NEGATIVE NEGATIVE Final    Comment: (NOTE) Fact Sheet for Patients: bloggercourse.com  Fact Sheet for Healthcare Providers: seriousbroker.it  This test is not yet approved or cleared by the [place]  FDA and has been authorized for detection and/or diagnosis of SARS-CoV-2 by FDA under an Emergency Use Authorization (EUA). This EUA will remain in effect (meaning this test can be used) for the duration of the COVID-19 declaration under Section 564(b)(1) of the Act, 21 U.S.C. section 360bbb-3(b)(1), unless the authorization is terminated or revoked.  Performed at [facility], [address] [place], [place] [address]   Gastrointestinal Panel by PCR , Stool     Status: None   Collection Time: 02/23/23  1:57 PM   Specimen: Urine, Clean Catch; Stool   Result Value Ref Range Status   Campylobacter species NOT DETECTED NOT DETECTED Final   Plesimonas shigelloides NOT DETECTED NOT DETECTED Final   Salmonella species NOT DETECTED NOT DETECTED Final   Yersinia enterocolitica NOT DETECTED NOT DETECTED Final   Vibrio species NOT DETECTED NOT DETECTED Final   Vibrio cholerae NOT DETECTED NOT DETECTED Final   Enteroaggregative E coli (EAEC) NOT DETECTED NOT DETECTED Final   Enteropathogenic E coli (EPEC) NOT DETECTED NOT DETECTED Final   Enterotoxigenic E coli (ETEC) NOT DETECTED NOT DETECTED Final   Shiga like toxin producing E coli (STEC) NOT DETECTED NOT DETECTED Final   Shigella/Enteroinvasive E coli (EIEC) NOT DETECTED NOT DETECTED Final   Cryptosporidium NOT DETECTED NOT DETECTED Final   Cyclospora cayetanensis NOT DETECTED NOT DETECTED Final   Entamoeba histolytica NOT DETECTED NOT DETECTED Final   Giardia lamblia NOT DETECTED NOT DETECTED Final   Adenovirus F40/41 NOT DETECTED NOT DETECTED Final   Astrovirus NOT DETECTED NOT DETECTED Final   Norovirus GI/GII NOT DETECTED NOT DETECTED Final   Rotavirus A NOT DETECTED NOT DETECTED Final   Sapovirus (I, II, IV, and V) NOT DETECTED NOT DETECTED Final    Comment: Performed at [facility], [address] [place], [place] [address]  C Difficile Quick Screen w PCR reflex     Status: None   Collection Time: 02/23/23  3:00 PM  Result Value Ref Range Status   C Diff antigen NEGATIVE NEGATIVE Final   C Diff toxin NEGATIVE NEGATIVE Final   C Diff interpretation No C. difficile detected.  Final    Comment: Performed at [facility], [address], [place], [place] [address]    RADIOLOGY STUDIES/RESULTS: DG Chest Port 1V same Day Result Date: 02/25/2023 CLINICAL DATA:  Shortness of breath. EXAM: PORTABLE CHEST 1 VIEW COMPARISON:  CT chest and chest x-ray dated February 23, 2023. FINDINGS: Stable normal heart size status post TAVR. Unchanged large hiatal hernia. Similar left  lower lobe atelectasis adjacent to the hernia. The right lung is clear. No pleural effusion or pneumothorax. No acute osseous abnormality. IMPRESSION: 1. No acute cardiopulmonary disease. 2. Unchanged large hiatal hernia. Electronically Signed   By: Elsie ONEIDA Shoulder M.D.   On: 02/25/2023 16:37     LOS: 1 day   Donalda Applebaum, MD  [facility]    To contact the attending provider between 7A-7P or the covering provider during after hours 7P-7A, please log into the web site [email or web address] and access using universal [place] password for that web site. If you do not have  the password, please call the hospital operator.  02/26/2023, 1:15 PM

## 2023-02-26 NOTE — Progress Notes (Signed)
 Physical Therapy Treatment Patient Details Name: [name] [name] MRN: [id number] DOB: April 02, 1931 Today's Date: 02/26/2023   History of Present Illness Patient is a 89 y.o. female who presented to the ED with intractable diarrhea. CT demonstrated new small pleural effusion-atelectasis-possible changes consistent with bronchiolitis-cirrhosis-postoperative findings from recent umbilical hernia and right inguinal hernia repair. Recent 1/27 laparoscopic right inguinal hernia repair with mesh-umbilical hernia repair, discharged home. Past history of HTN, HLD, aortic stenosis, COPD, liver cirrhosis, hypothyroidism.    PT Comments  Pt received sitting in the recliner and agreeable to session. Pt requests to use the bathroom at the beginning of the session and is able to void and requires assist with pericare. Pt able to tolerate increased gait distance and reports slightly improved pain this session. Pt reports concern about returning home with current level of pain and limited mobility tolerance. Pt continues to benefit from PT services to progress toward functional mobility goals.    If plan is discharge home, recommend the following: A little help with walking and/or transfers;A little help with bathing/dressing/bathroom;Assistance with cooking/housework;Assist for transportation;Help with stairs or ramp for entrance   Can travel by private vehicle        Equipment Recommendations  None recommended by PT    Recommendations for Other Services       Precautions / Restrictions Precautions Precautions: Fall;Other (comment) Precaution Comments: Recent abd sx on 1/27 Restrictions Weight Bearing Restrictions Per Provider Order: No     Mobility  Bed Mobility Overal bed mobility: Needs Assistance Bed Mobility: Sit to Supine       Sit to supine: Contact guard assist   General bed mobility comments: increased difficulty elevating BLE to EOB    Transfers Overall transfer level: Needs  assistance Equipment used: Rolling walker (2 wheels) Transfers: Sit to/from Stand Sit to Stand: Supervision           General transfer comment: From recliner with cues for hand placement    Ambulation/Gait Ambulation/Gait assistance: Supervision Gait Distance (Feet): 175 Feet Assistive device: Rolling walker (2 wheels) Gait Pattern/deviations: Step-through pattern, Decreased stride length Gait velocity: decreased     General Gait Details: slow, steady gait with one standing rest break due to shortness of breath       Balance Overall balance assessment: Needs assistance Sitting-balance support: Bilateral upper extremity supported, Feet supported Sitting balance-Leahy Scale: Fair Sitting balance - Comments: EOB   Standing balance support: Bilateral upper extremity supported, During functional activity Standing balance-Leahy Scale: Poor Standing balance comment: with RW support                            Cognition Arousal: Alert Behavior During Therapy: WFL for tasks assessed/performed, Anxious Overall Cognitive Status: Within Functional Limits for tasks assessed                                          Exercises      General Comments        Pertinent Vitals/Pain Pain Assessment Pain Assessment: Faces Faces Pain Scale: Hurts a little bit Pain Location: abdomen with mobility Pain Descriptors / Indicators: Discomfort, Sore Pain Intervention(s): Monitored during session, Repositioned     PT Goals (current goals can now be found in the care plan section) Acute Rehab PT Goals Patient Stated Goal: to get stronger PT Goal Formulation: With patient Time  For Goal Achievement: 03/11/23 Progress towards PT goals: Progressing toward goals    Frequency    Min 1X/week       AM-PAC PT 6 Clicks Mobility   Outcome Measure  Help needed turning from your back to your side while in a flat bed without using bedrails?: A Little Help  needed moving from lying on your back to sitting on the side of a flat bed without using bedrails?: A Little Help needed moving to and from a bed to a chair (including a wheelchair)?: A Little Help needed standing up from a chair using your arms (e.g., wheelchair or bedside chair)?: A Little Help needed to walk in hospital room?: A Little Help needed climbing 3-5 steps with a railing? : A Little 6 Click Score: 18    End of Session Equipment Utilized During Treatment: Gait belt Activity Tolerance: Patient limited by fatigue;Patient tolerated treatment well Patient left: with call bell/phone within reach;in bed;with nursing/sitter in room Nurse Communication: Mobility status PT Visit Diagnosis: Other abnormalities of gait and mobility (R26.89)     Time: 8879-8855 PT Time Calculation (min) (ACUTE ONLY): 24 min  Charges:    $Gait Training: 8-22 mins $Therapeutic Activity: 8-22 mins PT General Charges $$ ACUTE PT VISIT: 1 Visit                     Darryle George, PTA Acute Rehabilitation Services Secure Chat Preferred  [phone]    Darryle George 02/26/2023, 12:46 PM

## 2023-02-27 ENCOUNTER — Inpatient Hospital Stay (HOSPITAL_COMMUNITY): Payer: Medicare Other

## 2023-02-27 DIAGNOSIS — E876 Hypokalemia: Secondary | ICD-10-CM | POA: Diagnosis not present

## 2023-02-27 DIAGNOSIS — E871 Hypo-osmolality and hyponatremia: Secondary | ICD-10-CM | POA: Diagnosis not present

## 2023-02-27 DIAGNOSIS — R197 Diarrhea, unspecified: Secondary | ICD-10-CM | POA: Diagnosis not present

## 2023-02-27 DIAGNOSIS — E440 Moderate protein-calorie malnutrition: Secondary | ICD-10-CM | POA: Insufficient documentation

## 2023-02-27 LAB — BASIC METABOLIC PANEL
Anion gap: 8 (ref 5–15)
BUN: 13 mg/dL (ref 8–23)
CO2: 22 mmol/L (ref 22–32)
Calcium: 8.2 mg/dL — ABNORMAL LOW (ref 8.9–10.3)
Chloride: 99 mmol/L (ref 98–111)
Creatinine, Ser: 1.26 mg/dL — ABNORMAL HIGH (ref 0.44–1.00)
GFR, Estimated: 41 mL/min — ABNORMAL LOW (ref >60)
Glucose, Bld: 111 mg/dL — ABNORMAL HIGH (ref 70–99)
Potassium: 3.6 mmol/L (ref 3.5–5.1)
Sodium: 129 mmol/L — ABNORMAL LOW (ref 135–145)

## 2023-02-27 MED ORDER — BOOST PLUS PO LIQD
237.0000 mL | Freq: Three times a day (TID) | ORAL | Status: DC
  Administered 2023-02-27 – 2023-02-28 (×3): 237 mL via ORAL
  Filled 2023-02-27 (×5): qty 237

## 2023-02-27 MED ORDER — FENTANYL CITRATE PF 50 MCG/ML IJ SOSY
25.0000 ug | PREFILLED_SYRINGE | Freq: Once | INTRAMUSCULAR | Status: AC | PRN
  Administered 2023-02-27: 25 ug via INTRAVENOUS
  Filled 2023-02-27: qty 1

## 2023-02-27 MED ORDER — FUROSEMIDE 20 MG PO TABS
20.0000 mg | ORAL_TABLET | Freq: Every day | ORAL | Status: DC
  Administered 2023-02-28: 20 mg via ORAL
  Filled 2023-02-27: qty 1

## 2023-02-27 MED ORDER — DOCUSATE SODIUM 100 MG PO CAPS: 100.0000 mg | ORAL_CAPSULE | Freq: Two times a day (BID) | ORAL | Status: DC

## 2023-02-27 MED ORDER — POLYETHYLENE GLYCOL 3350 17 G PO PACK
17.0000 g | PACK | Freq: Every day | ORAL | Status: DC
  Administered 2023-02-27: 17 g via ORAL
  Filled 2023-02-27 (×2): qty 1

## 2023-02-27 MED ORDER — FUROSEMIDE 20 MG PO TABS: 20.0000 mg | ORAL_TABLET | Freq: Every day | ORAL | Status: DC

## 2023-02-27 NOTE — Progress Notes (Addendum)
 PROGRESS NOTE        PATIENT DETAILS Name: [name] [name] Age: 89 y.o. Sex: female Date of Birth: 01-Apr-1931 Admit Date: 02/23/2023 Admitting Physician Marsa KATHEE Scurry, MD ERE:[name], [name] SAUNDERS, MD  Brief Summary: Patient is a 89 y.o.  female with history of HTN, HLD, aortic stenosis, COPD, liver cirrhosis, hypothyroidism-who presented to the ED with intractable diarrhea.  She was subsequently admitted to the hospitalist service.  Significant events: 1/27>> laparoscopic right inguinal hernia repair with mesh-umbilical hernia repair 2/02>> to ED with diarrhea-admit to [facility].  Significant studies: 2/02>> CT chest/abdomen/pelvis: New small pleural effusion-atelectasis-possible changes consistent with bronchiolitis-cirrhosis-postoperative findings from recent umbilical hernia and right inguinal hernia repair.  Significant microbiology data: 2/2>> stool C. difficile studies: Negative 2/2>> stool GI pathogen panel: Negative 2/2>> COVID/influenza/RSV PCR: Negative  Procedures: None  Consults: [facility] PCCM  Subjective: She is eating better-abdominal pain is slowly improving-apparently had severe back pain last night that was unresponsive to Flexeril /oral narcotics and received 1 dose of IV fentanyl .  Objective: Vitals: Blood pressure 113/61, pulse 89, temperature 97.6 F (36.4 C), temperature source Oral, resp. rate 14, SpO2 95%.   Exam: Gen Exam:Alert awake-not in any distress HEENT:atraumatic, normocephalic Chest: B/L clear to auscultation anteriorly CVS:S1S2 regular Abdomen:soft non tender, non distended Extremities:no edema Neurology: Non focal Skin: no rash  Pertinent Labs/Radiology:    Latest Ref Rng & Units 02/26/2023    4:45 AM 02/25/2023    4:23 AM 02/23/2023    1:57 PM  CBC  WBC 4.0 - 10.5 K/uL 8.0  7.7  13.8   Hemoglobin 12.0 - 15.0 g/dL 89.6  9.9  88.1   Hematocrit 36.0 - 46.0 % 30.5  29.2  33.8   Platelets 150 - 400 K/uL 224  224  299      Lab Results  Component Value Date   NA 129 (L) 02/27/2023   K 3.6 02/27/2023   CL 99 02/27/2023   CO2 22 02/27/2023      Assessment/Plan: Diarrhea Likely viral etiology-her daughter has similar symptoms Stool studies are negative Diarrhea has resolved CT imaging with possible colitis/proctitis-I suspect this is related to her diarrheal illness. Continue as needed Imodium .  Hypovolemic hyponatremia Improved with IVF-sodium levels remain slightly on the lower side Lasix  Follow periodically.  Hypokalemia Secondary to GI loss Repleted.  Hypomagnesemia Repleted.  Hypophosphatemia Pleated.  ?  PNA/bronchiolitis Here with diarrhea-does not have any major respiratory issues-no symptoms suggestive of pneumonia-this is mostly an incidental finding on CT imaging. Appreciate PCCM input.  CKD stage IIIb At baseline  CAD s/p PCI 2020 No anginal symptoms Continue Plavix   History of severe AS-s/p TAVR 2021 Supportive care  Chronic HFpEF Euvolemic  COPD Not in exacerbation Continue bronchodilators  Liver cirrhosis/portal hypertension Stable/compensated Diuretics/propranolol  at half of home dose has been resumed.  Will slowly uptitrate over the next several days.  Hypothyroidism Synthroid   Recent/inguinal hernia repair-with postoperative pain Benign abdominal exam-CT scan on admission negative for any significant issues Abdominal pain is slowly improving-at times this abdominal pain seems disproportionate to exam/extent of surgery Continue as needed oxycodone  Continue to mobilize General Surgery following.  Worsening low back pain Ongoing for the past several months but worse for the past several days Had severe pain last evening that required IV fentanyl  Unclear whether this is musculoskeletal-she does have a history of compression fracture in the  past Will obtain MRI  Debility/deconditioning Secondary to diarrhea-recent hernia repair PT/OT eval-Home health  recommended.  BMI: Estimated body mass index is 21.87 kg/m as calculated from the following:   Height as of 02/17/23: 5' (1.524 m).   Weight as of 02/17/23: 50.8 kg.   Code status:   Code Status: Limited: Do not attempt resuscitation (DNR) -DNR-LIMITED -Do Not Intubate/DNI    DVT Prophylaxis: enoxaparin  (LOVENOX ) injection 40 mg Start: 02/24/23 1600   Family Communication: Daughter-Amy-[phone]-left VM 2/6  Disposition Plan: Status is: Observation The patient will require care spanning > 2 midnights and should be moved to inpatient because: Severity of illness   Planned Discharge Destination:Home health   Diet: Diet Order             DIET DYS 3 Room service appropriate? Yes with Assist; Fluid consistency: Thin  Diet effective now                     Antimicrobial agents: Anti-infectives (From admission, onward)    Start     Dose/Rate Route Frequency Ordered Stop   02/23/23 1700  doxycycline  (VIBRA -TABS) tablet 100 mg        100 mg Oral  Once 02/23/23 1653 02/23/23 1714        MEDICATIONS: Scheduled Meds:  alum & mag hydroxide-simeth  15 mL Oral TID AC & HS   arformoterol   15 mcg Nebulization BID   And   umeclidinium bromide   1 puff Inhalation Daily   budesonide  (PULMICORT ) nebulizer solution  0.25 mg Nebulization BID   clopidogrel   75 mg Oral Daily   enoxaparin  (LOVENOX ) injection  40 mg Subcutaneous Q24H   feeding supplement  237 mL Oral BID BM   ipratropium-albuterol   3 mL Nebulization BID   levothyroxine   50 mcg Oral QAC breakfast   montelukast   10 mg Oral QHS   pantoprazole   40 mg Oral Daily   polyethylene glycol  17 g Oral Daily   propranolol   10 mg Oral BID   Continuous Infusions: PRN Meds:.acetaminophen  **OR** acetaminophen , albuterol , cyclobenzaprine , hydrALAZINE , lactulose , loperamide , menthol -cetylpyridinium, oxyCODONE , phenol   I have personally reviewed following labs and imaging studies  LABORATORY DATA: CBC: Recent Labs  Lab  02/23/23 1357 02/25/23 0423 02/26/23 0445  WBC 13.8* 7.7 8.0  NEUTROABS 10.6*  --   --   HGB 11.8* 9.9* 10.3*  HCT 33.8* 29.2* 30.5*  MCV 90.1 91.8 92.4  PLT 299 224 224    Basic Metabolic Panel: Recent Labs  Lab 02/23/23 1357 02/24/23 0052 02/24/23 0637 02/25/23 0423 02/26/23 0445 02/27/23 0432  NA 122* 122* 125* 131* 128* 129*  K 3.5 3.4* 3.6 3.1* 3.8 3.6  CL 90* 93* 95* 100 99 99  CO2 23 21* 20* 21* 18* 22  GLUCOSE 110* 101* 97 121* 97 111*  BUN 17 18 17 13 11 13   CREATININE 0.92 0.89 0.89 1.10* 1.15* 1.26*  CALCIUM  8.6* 7.7* 8.1* 7.5* 8.0* 8.2*  MG 1.7  --   --  1.6* 2.6*  --   PHOS  --   --   --  2.1* 2.7  --     GFR: Estimated Creatinine Clearance: 21.7 mL/min (A) (by C-G formula based on SCr of 1.26 mg/dL (H)).  Liver Function Tests: Recent Labs  Lab 02/23/23 1357  AST 41  ALT 33  ALKPHOS 315*  BILITOT 1.2  PROT 7.0  ALBUMIN  3.3*   Recent Labs  Lab 02/23/23 1357  LIPASE 13   No  results for input(s): AMMONIA in the last 168 hours.  Coagulation Profile: No results for input(s): INR, PROTIME in the last 168 hours.  Cardiac Enzymes: No results for input(s): CKTOTAL, CKMB, CKMBINDEX, TROPONINI in the last 168 hours.  BNP (last 3 results) No results for input(s): PROBNP in the last 8760 hours.  Lipid Profile: No results for input(s): CHOL, HDL, LDLCALC, TRIG, CHOLHDL, LDLDIRECT in the last 72 hours.  Thyroid  Function Tests: No results for input(s): TSH, T4TOTAL, FREET4, T3FREE, THYROIDAB in the last 72 hours.   Anemia Panel: No results for input(s): VITAMINB12, FOLATE, FERRITIN, TIBC, IRON, RETICCTPCT in the last 72 hours.  Urine analysis:    Component Value Date/Time   COLORURINE STRAW (A) 02/26/2023 0619   APPEARANCEUR CLEAR 02/26/2023 0619   LABSPEC 1.003 (L) 02/26/2023 0619   PHURINE 6.0 02/26/2023 0619   GLUCOSEU NEGATIVE 02/26/2023 0619   HGBUR NEGATIVE 02/26/2023 0619    BILIRUBINUR NEGATIVE 02/26/2023 0619   KETONESUR NEGATIVE 02/26/2023 0619   PROTEINUR NEGATIVE 02/26/2023 0619   UROBILINOGEN 0.2 08/23/2018 1044   NITRITE NEGATIVE 02/26/2023 0619   LEUKOCYTESUR TRACE (A) 02/26/2023 0619    Sepsis Labs: Lactic Acid, Venous    Component Value Date/Time   LATICACIDVEN 1.5 02/23/2023 1715    MICROBIOLOGY: Recent Results (from the past 240 hours)  Resp panel by RT-PCR (RSV, Flu A&B, Covid) Anterior Nasal Swab     Status: None   Collection Time: 02/23/23  1:57 PM   Specimen: Anterior Nasal Swab  Result Value Ref Range Status   SARS Coronavirus 2 by RT PCR NEGATIVE NEGATIVE Final    Comment: (NOTE) SARS-CoV-2 target nucleic acids are NOT DETECTED.  The SARS-CoV-2 RNA is generally detectable in upper respiratory specimens during the acute phase of infection. The lowest concentration of SARS-CoV-2 viral copies this assay can detect is 138 copies/mL. A negative result does not preclude SARS-Cov-2 infection and should not be used as the sole basis for treatment or other patient management decisions. A negative result may occur with  improper specimen collection/handling, submission of specimen other than nasopharyngeal swab, presence of viral mutation(s) within the areas targeted by this assay, and inadequate number of viral copies(<138 copies/mL). A negative result must be combined with clinical observations, patient history, and epidemiological information. The expected result is Negative.  Fact Sheet for Patients:  bloggercourse.com  Fact Sheet for Healthcare Providers:  seriousbroker.it  This test is no t yet approved or cleared by the [place]  FDA and  has been authorized for detection and/or diagnosis of SARS-CoV-2 by FDA under an Emergency Use Authorization (EUA). This EUA will remain  in effect (meaning this test can be used) for the duration of the COVID-19 declaration under Section  564(b)(1) of the Act, 21 U.S.C.section 360bbb-3(b)(1), unless the authorization is terminated  or revoked sooner.       Influenza A by PCR NEGATIVE NEGATIVE Final   Influenza B by PCR NEGATIVE NEGATIVE Final    Comment: (NOTE) The Xpert Xpress SARS-CoV-2/FLU/RSV plus assay is intended as an aid in the diagnosis of influenza from Nasopharyngeal swab specimens and should not be used as a sole basis for treatment. Nasal washings and aspirates are unacceptable for Xpert Xpress SARS-CoV-2/FLU/RSV testing.  Fact Sheet for Patients: bloggercourse.com  Fact Sheet for Healthcare Providers: seriousbroker.it  This test is not yet approved or cleared by the [place]  FDA and has been authorized for detection and/or diagnosis of SARS-CoV-2 by FDA under an Emergency Use Authorization (EUA). This EUA will  remain in effect (meaning this test can be used) for the duration of the COVID-19 declaration under Section 564(b)(1) of the Act, 21 U.S.C. section 360bbb-3(b)(1), unless the authorization is terminated or revoked.     Resp Syncytial Virus by PCR NEGATIVE NEGATIVE Final    Comment: (NOTE) Fact Sheet for Patients: bloggercourse.com  Fact Sheet for Healthcare Providers: seriousbroker.it  This test is not yet approved or cleared by the [place]  FDA and has been authorized for detection and/or diagnosis of SARS-CoV-2 by FDA under an Emergency Use Authorization (EUA). This EUA will remain in effect (meaning this test can be used) for the duration of the COVID-19 declaration under Section 564(b)(1) of the Act, 21 U.S.C. section 360bbb-3(b)(1), unless the authorization is terminated or revoked.  Performed at [facility], [address] [place], [place] [address]   Gastrointestinal Panel by PCR , Stool     Status: None   Collection Time: 02/23/23  1:57 PM    Specimen: Urine, Clean Catch; Stool  Result Value Ref Range Status   Campylobacter species NOT DETECTED NOT DETECTED Final   Plesimonas shigelloides NOT DETECTED NOT DETECTED Final   Salmonella species NOT DETECTED NOT DETECTED Final   Yersinia enterocolitica NOT DETECTED NOT DETECTED Final   Vibrio species NOT DETECTED NOT DETECTED Final   Vibrio cholerae NOT DETECTED NOT DETECTED Final   Enteroaggregative E coli (EAEC) NOT DETECTED NOT DETECTED Final   Enteropathogenic E coli (EPEC) NOT DETECTED NOT DETECTED Final   Enterotoxigenic E coli (ETEC) NOT DETECTED NOT DETECTED Final   Shiga like toxin producing E coli (STEC) NOT DETECTED NOT DETECTED Final   Shigella/Enteroinvasive E coli (EIEC) NOT DETECTED NOT DETECTED Final   Cryptosporidium NOT DETECTED NOT DETECTED Final   Cyclospora cayetanensis NOT DETECTED NOT DETECTED Final   Entamoeba histolytica NOT DETECTED NOT DETECTED Final   Giardia lamblia NOT DETECTED NOT DETECTED Final   Adenovirus F40/41 NOT DETECTED NOT DETECTED Final   Astrovirus NOT DETECTED NOT DETECTED Final   Norovirus GI/GII NOT DETECTED NOT DETECTED Final   Rotavirus A NOT DETECTED NOT DETECTED Final   Sapovirus (I, II, IV, and V) NOT DETECTED NOT DETECTED Final    Comment: Performed at [facility], [address] [place], [place] [address]  C Difficile Quick Screen w PCR reflex     Status: None   Collection Time: 02/23/23  3:00 PM  Result Value Ref Range Status   C Diff antigen NEGATIVE NEGATIVE Final   C Diff toxin NEGATIVE NEGATIVE Final   C Diff interpretation No C. difficile detected.  Final    Comment: Performed at [facility], [address], [place], [place] [address]    RADIOLOGY STUDIES/RESULTS: DG Chest Port 1V same Day Result Date: 02/25/2023 CLINICAL DATA:  Shortness of breath. EXAM: PORTABLE CHEST 1 VIEW COMPARISON:  CT chest and chest x-ray dated February 23, 2023. FINDINGS: Stable normal heart size status post TAVR. Unchanged  large hiatal hernia. Similar left lower lobe atelectasis adjacent to the hernia. The right lung is clear. No pleural effusion or pneumothorax. No acute osseous abnormality. IMPRESSION: 1. No acute cardiopulmonary disease. 2. Unchanged large hiatal hernia. Electronically Signed   By: Elsie ONEIDA Shoulder M.D.   On: 02/25/2023 16:37     LOS: 2 days   Donalda Applebaum, MD  [facility]    To contact the attending provider between 7A-7P or the covering provider during after hours 7P-7A, please log into the web site [email or web address] and  access using universal [place] password for that web site. If you do not have the password, please call the hospital operator.  02/27/2023, 11:36 AM

## 2023-02-27 NOTE — Plan of Care (Signed)
  Problem: Clinical Measurements: Goal: Respiratory complications will improve Outcome: Progressing   Problem: Activity: Goal: Risk for activity intolerance will decrease Outcome: Progressing   Problem: Nutrition: Goal: Adequate nutrition will be maintained Outcome: Progressing   Problem: Elimination: Goal: Will not experience complications related to bowel motility Outcome: Progressing Goal: Will not experience complications related to urinary retention Outcome: Progressing   Problem: Pain Managment: Goal: General experience of comfort will improve and/or be controlled Outcome: Progressing

## 2023-02-27 NOTE — Progress Notes (Addendum)
 Nutrition Follow-up  DOCUMENTATION CODES:   Non-severe (moderate) malnutrition in context of chronic illness  INTERVENTION:  Adjust diet to dysphagia 3 diet to help with swollowing difficulties. Pending SLP eval  D/c patient dislikes Ensure Enlive po BID, each supplement provides 350 kcal and 20 grams of protein. Magic cup TID with meals, each supplement provides 290 kcal and 9 grams of protein Protein containing snacks between meals TID Assistance with feeding and tray setup Boost Plus po TID, each supplement provides 360 kcal and 14 grams of protein   NUTRITION DIAGNOSIS:   Moderate Malnutrition related to chronic illness as evidenced by severe fat depletion, moderate fat depletion, severe muscle depletion, moderate muscle depletion.    GOAL:   Patient will meet greater than or equal to 90% of their needs    MONITOR:   PO intake, Weight trends, Labs, Supplement acceptance  REASON FOR ASSESSMENT:   Consult Assessment of nutrition requirement/status  ASSESSMENT:   Pt presented to ED with diarrhea, nausea, poor po intake, progressive generalized weakness and found to have hyponatremia. Pt with PMH of HTN, HLD, CAD, severe aortic stenosis, TIA, CKD, COPD, liver cirrhosis, hypothyroidism, GERD. Patient sleeping at time of visit.  Did not wake to RD voice or touch.  Nutrition evaluated 02/26/23.  Implementing interventions. Extensive discussion with patient and daughter.    Significant events: 1/27>> laparoscopic right inguinal hernia repair with mesh-umbilical hernia repair 2/02>> to ED with diarrhea-admit to [facility].   Significant studies: 2/02>> CT chest/abdomen/pelvis: New small pleural effusion-atelectasis-possible changes consistent with bronchiolitis-cirrhosis-postoperative findings from recent umbilical hernia and right inguinal hernia repair.   Significant microbiology data: 2/2>> stool C. difficile studies: Negative 2/2>> stool GI pathogen panel: Negative 2/2>>  COVID/influenza/RSV PCR: Negative   NUTRITION - FOCUSED PHYSICAL EXAM:  Flowsheet Row Most Recent Value  Orbital Region Moderate depletion  Upper Arm Region Severe depletion  Thoracic and Lumbar Region Unable to assess  [brace on]  Buccal Region Moderate depletion  Temple Region Moderate depletion  Clavicle Bone Region Severe depletion  Clavicle and Acromion Bone Region Severe depletion  Scapular Bone Region Severe depletion  Dorsal Hand Severe depletion  Patellar Region Moderate depletion  Anterior Thigh Region Moderate depletion  Posterior Calf Region Moderate depletion  Edema (RD Assessment) None  Hair Reviewed  Eyes Reviewed  Mouth Reviewed  Skin Reviewed  Nails Reviewed       Diet Order:   Diet Order             DIET DYS 3 Room service appropriate? Yes with Assist; Fluid consistency: Thin  Diet effective now                   EDUCATION NEEDS:   Education needs have been addressed  Skin:  Skin Assessment: Reviewed RN Assessment  Last BM:  2/3  Height:   Ht Readings from Last 1 Encounters:  02/17/23 5' (1.524 m)    Weight:   Wt Readings from Last 1 Encounters:  02/17/23 50.8 kg    Ideal Body Weight:  45.5 kg  BMI:  There is no height or weight on file to calculate BMI.  Estimated Nutritional Needs:   Kcal:  1300-1500  Protein:  65-80 g  Fluid:  >/= 1.5 L    Jenna Pew RDN, LDN Clinical Dietitian   If unable to reach, please contact RD Inpatient secure chat group between 8 am-4 pm daily

## 2023-02-27 NOTE — Telephone Encounter (Signed)
 Ok to keep the appointment

## 2023-02-27 NOTE — Progress Notes (Signed)
 IP rehab admissions - Received consult for rehab admission.  Noted PT/OT recommending HH therapies.  Patient was able to transfer with supervision and ambulate 175' with supervision yesterday.  Patient does not meet criteria for an acute inpatient rehab stay.  Options are home with [facility] or could consider SNF placement if appropriate.  Call for questions.  [phone]

## 2023-02-27 NOTE — Plan of Care (Signed)
  Problem: Pain Managment: Goal: General experience of comfort will improve and/or be controlled Outcome: Not Progressing   Problem: Safety: Goal: Ability to remain free from injury will improve Outcome: Progressing   Problem: Skin Integrity: Goal: Risk for impaired skin integrity will decrease Outcome: Progressing

## 2023-02-27 NOTE — Plan of Care (Signed)
 Pt. Unable to sleep through the night per report. Pt. Sleep at start of shift. Rise and fall of chest noted. Meds tolerated well throughout shift. Pt. Ambulated in hallway at 1659, tolerated well.

## 2023-02-27 NOTE — Progress Notes (Signed)
      Chief Complaint/Subjective: Abdominal pain better, back pain worse, taking boost and some food, no BM in multiple days  Objective: Vital signs in last 24 hours: Temp:  [97.4 F (36.3 C)-97.8 F (36.6 C)] 97.6 F (36.4 C) (02/06 0400) Pulse Rate:  [89-98] 89 (02/05 2024) Resp:  [16-22] 22 (02/05 2005) BP: (119-128)/(66-70) 127/68 (02/05 1600) SpO2:  [93 %-100 %] 97 % (02/06 0857) Last BM Date : 02/24/23 Intake/Output from previous day: 02/05 0701 - 02/06 0700 In: 360 [P.O.:360] Out: -   PE: Gen: NAD Resp: nonlabored Card: RRR Abd: soft, incisions c/d/i  Lab Results:  Recent Labs    02/25/23 0423 02/26/23 0445  WBC 7.7 8.0  HGB 9.9* 10.3*  HCT 29.2* 30.5*  PLT 224 224   Recent Labs    02/26/23 0445 02/27/23 0432  NA 128* 129*  K 3.8 3.6  CL 99 99  CO2 18* 22  GLUCOSE 97 111*  BUN 11 13  CREATININE 1.15* 1.26*  CALCIUM  8.0* 8.2*   No results for input(s): LABPROT, INR in the last 72 hours.    Component Value Date/Time   NA 129 (L) 02/27/2023 0432   NA 132 (L) 07/26/2021 1222   K 3.6 02/27/2023 0432   CL 99 02/27/2023 0432   CO2 22 02/27/2023 0432   GLUCOSE 111 (H) 02/27/2023 0432   BUN 13 02/27/2023 0432   BUN 27 07/26/2021 1222   CREATININE 1.26 (H) 02/27/2023 0432   CREATININE 1.52 (H) 11/23/2021 1435   CALCIUM  8.2 (L) 02/27/2023 0432   PROT 7.0 02/23/2023 1357   PROT 7.7 12/24/2021 1555   ALBUMIN  3.3 (L) 02/23/2023 1357   ALBUMIN  4.4 12/24/2021 1555   AST 41 02/23/2023 1357   AST 75 (H) 11/23/2021 1435   ALT 33 02/23/2023 1357   ALT 43 11/23/2021 1435   ALKPHOS 315 (H) 02/23/2023 1357   BILITOT 1.2 02/23/2023 1357   BILITOT 0.8 12/24/2021 1555   BILITOT 0.6 11/23/2021 1435   GFRNONAA 41 (L) 02/27/2023 0432   GFRNONAA 33 (L) 11/23/2021 1435   GFRAA 41 (L) 11/30/2019 1007    Assessment/Plan  s/p robotic right inguinal hernia repair, readmitted for diarrhea which has now resolved FEN - boost,dys 3 VTE - lovenox  ID - all  antibiotics are stopped Disposition - has not had imodium  in 2 days but has not had bowel movement as well, will prescribe stool softener to help   LOS: 2 days   I reviewed last 24 h vitals and pain scores, last 48 h intake and output, last 24 h labs and trends, and last 24 h imaging results.  This care required moderate level of medical decision making.   Herlene Righter [facility] 02/27/2023, 9:01 AM Please see Amion for pager number during day hours 7:00am-4:30pm or 7:00am -11:30am on weekends

## 2023-02-28 ENCOUNTER — Other Ambulatory Visit (HOSPITAL_COMMUNITY): Payer: Self-pay

## 2023-02-28 ENCOUNTER — Telehealth: Payer: Self-pay | Admitting: Cardiology

## 2023-02-28 DIAGNOSIS — K6289 Other specified diseases of anus and rectum: Secondary | ICD-10-CM

## 2023-02-28 DIAGNOSIS — E876 Hypokalemia: Secondary | ICD-10-CM | POA: Diagnosis not present

## 2023-02-28 DIAGNOSIS — E871 Hypo-osmolality and hyponatremia: Secondary | ICD-10-CM | POA: Diagnosis not present

## 2023-02-28 DIAGNOSIS — R197 Diarrhea, unspecified: Secondary | ICD-10-CM | POA: Diagnosis not present

## 2023-02-28 LAB — BASIC METABOLIC PANEL
Anion gap: 7 (ref 5–15)
BUN: 14 mg/dL (ref 8–23)
CO2: 23 mmol/L (ref 22–32)
Calcium: 8.3 mg/dL — ABNORMAL LOW (ref 8.9–10.3)
Chloride: 100 mmol/L (ref 98–111)
Creatinine, Ser: 1.36 mg/dL — ABNORMAL HIGH (ref 0.44–1.00)
GFR, Estimated: 37 mL/min — ABNORMAL LOW (ref >60)
Glucose, Bld: 107 mg/dL — ABNORMAL HIGH (ref 70–99)
Potassium: 3.9 mmol/L (ref 3.5–5.1)
Sodium: 130 mmol/L — ABNORMAL LOW (ref 135–145)

## 2023-02-28 MED ORDER — MELATONIN 5 MG PO TABS: 5.0000 mg | ORAL_TABLET | Freq: Every day | ORAL | Status: DC

## 2023-02-28 MED ORDER — CYCLOBENZAPRINE HCL 5 MG PO TABS
5.0000 mg | ORAL_TABLET | Freq: Three times a day (TID) | ORAL | 0 refills | Status: AC | PRN
Start: 2023-02-28 — End: 2023-03-10
  Filled 2023-02-28: qty 30, 10d supply, fill #0

## 2023-02-28 MED ORDER — OXYCODONE HCL 5 MG PO TABS
5.0000 mg | ORAL_TABLET | Freq: Four times a day (QID) | ORAL | 0 refills | Status: AC | PRN
  Filled 2023-02-28: qty 15, 4d supply, fill #0

## 2023-02-28 NOTE — Discharge Instructions (Addendum)
 Date of Service: 02/28/2023  7:47 AM   Signed      Late entry.    OT impression: Pt is making great progress towards their acute OT goals. Overall she demonstrated increased activity tolerance with ability to ambulate into the hall with superivsion A and a RW and only 2 standing rest breaks. After ~5 minutes of mobility, pt tolerated toileting and grooming with gross superivsion A only. OT to continue to follow acutely to facilitate progress towards established goals. Pt will continue to benefit from [facility].       02/27/23 1345  OT Visit Information  Last OT Received On 02/27/23  Assistance Needed +1  History of Present Illness Patient is a 89 y.o. female who presented to the ED with intractable diarrhea. CT demonstrated new small pleural effusion-atelectasis-possible changes consistent with bronchiolitis-cirrhosis-postoperative findings from recent umbilical hernia and right inguinal hernia repair. Recent 1/27 laparoscopic right inguinal hernia repair with mesh-umbilical hernia repair, discharged home. Past history of HTN, HLD, aortic stenosis, COPD, liver cirrhosis, hypothyroidism.  Precautions  Precautions Fall;Other (comment)  Precaution Comments Recent abd sx on 1/27  Restrictions  Weight Bearing Restrictions Per Provider Order No  Pain Assessment  Pain Assessment Faces  Faces Pain Scale 2  Pain Location low back  Pain Descriptors / Indicators Discomfort;Sore  Pain Intervention(s) Limited activity within patient's tolerance;Monitored during session  Cognition  Arousal Alert  Behavior During Therapy [facility] for tasks assessed/performed;Anxious  Overall Cognitive Status Within Functional Limits for tasks assessed  Upper Extremity Assessment  Upper Extremity Assessment Generalized weakness  Lower Extremity Assessment  Lower Extremity Assessment Generalized weakness  Vision- Assessment  Vision Assessment? No apparent visual deficits  Perception  Perception WFL  Praxis  Praxis WFL   ADL  Overall ADL's  Needs assistance/impaired  Grooming Supervision/safety;Wash/dry hands;Standing  Grooming Details (indicate cue type and reason) at the sink, poor RW Lobbyist walker (2 wheels)  Toileting- Clothing Manipulation and Hygiene Modified independent;Sitting/lateral lean  Functional mobility during ADLs Supervision/safety;Rolling walker (2 wheels)  General ADL Comments significant improvement in activity tolerance this date  Bed Mobility  Overal bed mobility Needs Assistance  Bed Mobility Sit to Supine  Sit to supine Supervision  Transfers  Overall transfer level Needs assistance  Equipment used Rolling walker (2 wheels)  Transfers Sit to/from Stand  Sit to Stand Supervision  Balance  Overall balance assessment No apparent balance deficits (not formally assessed)  General Comments  General comments (skin integrity, edema, etc.) VSS, soft BP but stable and asymptomatic  OT - End of Session  Equipment Utilized During Treatment Rolling walker (2 wheels)  Activity Tolerance Patient tolerated treatment well  Patient left in bed;with call bell/phone within reach;with bed alarm set  Nurse Communication Mobility status  OT Assessment/Plan  OT Visit Diagnosis Unsteadiness on feet (R26.81);Other abnormalities of gait and mobility (R26.89);Muscle weakness (generalized) (M62.81)  OT Frequency (ACUTE ONLY) Min 1X/week  Follow Up Recommendations Home health OT  Patient can return home with the following A little help with walking and/or transfers;A lot of help with bathing/dressing/bathroom;Assistance with cooking/housework;Direct supervision/assist for medications management;Direct supervision/assist for financial management;Help with stairs or ramp for entrance;Assist for transportation  OT Equipment None recommended by OT  AM-PAC OT 6 Clicks Daily Activity Outcome Measure (Version 2)  Help from another person eating meals? 3   Help from another person taking care of personal grooming? 3  Help from another person toileting, which includes using toliet, bedpan, or urinal? 3  Help from another person bathing (including washing, rinsing, drying)? 3  Help from another person to put on and taking off regular upper body clothing? 3  Help from another person to put on and taking off regular lower body clothing? 3  6 Click Score 18  Progressive Mobility  What is the highest level of mobility based on the progressive mobility assessment? Level 5 (Walks with assist in room/hall) - Balance while stepping forward/back and can walk in room with assist - Complete  Activity Ambulated with assistance in hallway  OT Goal Progression  Progress towards OT goals Progressing toward goals  Acute Rehab OT Goals  Patient Stated Goal to get better  OT Goal Formulation With patient  Time For Goal Achievement 03/11/23  Potential to Achieve Goals Fair  ADL Goals  Pt Will Perform Grooming with modified independence;standing  Pt Will Perform Lower Body Dressing with modified independence;sit to/from stand  Pt Will Transfer to Toilet with modified independence;ambulating  Additional ADL Goal #1 pt will complete at least 10 minutes of OOB activity to demonstrate improved tolerance for ADLs  Additional ADL Goal #2 Pt will indep recall at least 3 energy conservation strategies to ensure safe transition to home environment at discharge  OT Time Calculation  OT Start Time (ACUTE ONLY) 1505  OT Stop Time (ACUTE ONLY) 1525  OT Time Calculation (min) 20 min  OT General Charges  $OT Visit 1 Visit  OT Treatments  $Therapeutic Activity 8-22 mins           ments  Pt received sitting in the recliner and agreeable to session. Pt requests to use the bathroom at the beginning of the session and is able to void and requires assist with pericare. Pt able to tolerate increased gait distance and reports slightly improved pain this session. Pt reports  concern about returning home with current level of pain and limited mobility tolerance. Pt continues to benefit from PT services to progress toward functional mobility goals.     If plan is discharge home, recommend the following: A little help with walking and/or transfers;A little help with bathing/dressing/bathroom;Assistance with cooking/housework;Assist for transportation;Help with stairs or ramp for entrance    Can travel by private vehicle        Equipment Recommendations   None recommended by PT     Recommendations for Other Services        Precautions / Restrictions Precautions Precautions: Fall;Other (comment) Precaution Comments: Recent abd sx on 1/27 Restrictions Weight Bearing Restrictions Per Provider Order: No       Mobility   Bed Mobility Overal bed mobility: Needs Assistance Bed Mobility: Sit to Supine Sit to supine: Contact guard assist General bed mobility comments: increased difficulty elevating BLE to EOB   Transfers Overall transfer level: Needs assistance Equipment used: Rolling walker (2 wheels) Transfers: Sit to/from Stand Sit to Stand: Supervision General transfer comment: From recliner with cues for hand placement   Ambulation/Gait Ambulation/Gait assistance: Supervision Gait Distance (Feet): 175 Feet Assistive device: Rolling walker (2 wheels) Gait Pattern/deviations: Step-through pattern, Decreased stride length Gait velocity: decreased General Gait Details: slow, steady gait with one standing rest break due to shortness of breath            Balance Overall balance assessment: Needs assistance Sitting-balance support: Bilateral upper extremity supported, Feet supported Sitting balance-Leahy Scale: Fair Sitting balance - Comments: EOB Standing balance support: Bilateral upper extremity supported, During functional activity Standing balance-Leahy Scale: Poor Standing balance comment: with RW support  Cognition Arousal:  Alert Behavior During Therapy: WFL for tasks assessed/performed, Anxious Overall Cognitive Status: Within Functional Limits for tasks assessed      Exercises      General Comments        Pertinent Vitals/Pain Pain Assessment Pain Assessment: Faces Faces Pain Scale: Hurts a little bit Pain Location: abdomen with mobility Pain Descriptors / Indicators: Discomfort, Sore Pain Intervention(s): Monitored during session, Repositioned        PT Goals (current goals can now be found in the care plan section) Acute Rehab PT Goals Patient Stated Goal: to get stronger PT Goal Formulation: With patient Time For Goal Achievement: 03/11/23 Progress towards PT goals: Progressing toward goals      Frequency       Min 1X/week            AM-PAC PT 6 Clicks Mobility   Outcome Measure   Help needed turning from your back to your side while in a flat bed without using bedrails?: A Little Help needed moving from lying on your back to sitting on the side of a flat bed without using bedrails?: A Little Help needed moving to and from a bed to a chair (including a wheelchair)?: A Little Help needed standing up from a chair using your arms (e.g., wheelchair or bedside chair)?: A Little Help needed to walk in hospital room?: A Little Help needed climbing 3-5 steps with a railing? : A Little 6 Click Score: 18      End of Session Equipment Utilized During Treatment: Gait belt Activity Tolerance: Patient limited by fatigue;Patient tolerated treatment well Patient left: with call bell/phone within reach;in bed;with nursing/sitter in room Nurse Communication: Mobility status PT Visit Diagnosis: Other abnormalities of gait and mobility (R26.89)

## 2023-02-28 NOTE — TOC Transition Note (Signed)
 Transition of Care [facility]) - Discharge Note   Patient Details  Name: [name] [name] MRN: [id number] Date of Birth: October 03, 1931  [facility] CM/SW Contact:  Roxie KANDICE Stain, RN Phone Number: 02/28/2023, 12:28 PM   Clinical Narrative:    Patient stable for discharge.  Patient requested this RNCM speak to daughter about home health.  Amy, daughter, stated patient has never had home health and she would like patient to use [facility] since patient's husband has used them in the past.  Beverley with [facility] accepted referral .  Amy will transport home.    Final next level of care: Home w Home Health Services Barriers to Discharge: Barriers Resolved   Patient Goals and CMS Choice Patient states their goals for this hospitalization and ongoing recovery are:: return home CMS Medicare.gov Compare Post Acute Care list provided to:: Patient Represenative (must comment) Choice offered to / list presented to : Adult Children      Discharge Placement                 home      Discharge Plan and Services Additional resources added to the After Visit Summary for                            [facility] Arranged: PT, RN [facility] Agency: [facility] Date [facility] Agency Contacted: 02/28/23 Time HH Agency Contacted: 1227 Representative spoke with at [facility] Agency: Beverley  Social Drivers of Health (SDOH) Interventions SDOH Screenings   Food Insecurity: No Food Insecurity (02/25/2023)  Housing: Low Risk  (02/25/2023)  Transportation Needs: Unmet Transportation Needs (02/25/2023)  Utilities: Not At Risk (02/25/2023)  Depression (PHQ2-9): Low Risk  (06/06/2020)  Social Connections: Unknown (02/25/2023)  Tobacco Use: Low Risk  (02/17/2023)     Readmission Risk Interventions    02/28/2023   12:28 PM  Readmission Risk Prevention Plan  Transportation Screening Complete  PCP or Specialist Appt within 3-5 Days Complete  HRI or Home Care Consult Complete  Social Work Consult for Recovery  Care Planning/Counseling Complete  Palliative Care Screening Not Applicable  Medication Review [employer]) Complete

## 2023-02-28 NOTE — Care Management Important Message (Signed)
 Important Message  Patient Details  Name: [name] [name] MRN: [id number] Date of Birth: 05-31-1931   Important Message Given:  Yes - Medicare IM     Wynonia Hedges 02/28/2023, 3:20 PM

## 2023-02-28 NOTE — Telephone Encounter (Signed)
 Called patient about about message. Patient's daughter NP is complaining about patient's BLE edema and patient's low sodium at 121, SOB, and recent hospital discharge. Patient stated she has lasix  as needed. Encouraged her to take her lasix . Made patient a DOD appointment with Dr. Wonda on Monday, since patient's daughter needs patient to be seen by a doctor. Patient has multiple issues, will send message to Dr. Ladona.

## 2023-02-28 NOTE — Telephone Encounter (Signed)
 Pt c/o Shortness Of Breath: STAT if SOB developed within the last 24 hours or pt is noticeably SOB on the phone  1. Are you currently SOB (can you hear that pt is SOB on the phone)? Yes   2. How long have you been experiencing SOB? Since being hospitalized  3. Are you SOB when sitting or when up moving around? Both   4. Are you currently experiencing any other symptoms? Chest pain; swelling in legs

## 2023-02-28 NOTE — Progress Notes (Signed)
 Late entry.   OT impression: Pt is making great progress towards their acute OT goals. Overall she demonstrated increased activity tolerance with ability to ambulate into the hall with superivsion A and a RW and only 2 standing rest breaks. After ~5 minutes of mobility, pt tolerated toileting and grooming with gross superivsion A only. OT to continue to follow acutely to facilitate progress towards established goals. Pt will continue to benefit from [facility].    02/27/23 1345  OT Visit Information  Last OT Received On 02/27/23  Assistance Needed +1  History of Present Illness Patient is a 89 y.o. female who presented to the ED with intractable diarrhea. CT demonstrated new small pleural effusion-atelectasis-possible changes consistent with bronchiolitis-cirrhosis-postoperative findings from recent umbilical hernia and right inguinal hernia repair. Recent 1/27 laparoscopic right inguinal hernia repair with mesh-umbilical hernia repair, discharged home. Past history of HTN, HLD, aortic stenosis, COPD, liver cirrhosis, hypothyroidism.  Precautions  Precautions Fall;Other (comment)  Precaution Comments Recent abd sx on 1/27  Restrictions  Weight Bearing Restrictions Per Provider Order No  Pain Assessment  Pain Assessment Faces  Faces Pain Scale 2  Pain Location low back  Pain Descriptors / Indicators Discomfort;Sore  Pain Intervention(s) Limited activity within patient's tolerance;Monitored during session  Cognition  Arousal Alert  Behavior During Therapy [facility] for tasks assessed/performed;Anxious  Overall Cognitive Status Within Functional Limits for tasks assessed  Upper Extremity Assessment  Upper Extremity Assessment Generalized weakness  Lower Extremity Assessment  Lower Extremity Assessment Generalized weakness  Vision- Assessment  Vision Assessment? No apparent visual deficits  Perception  Perception WFL  Praxis  Praxis WFL  ADL  Overall ADL's  Needs assistance/impaired  Grooming  Supervision/safety;Wash/dry hands;Standing  Grooming Details (indicate cue type and reason) at the sink, poor RW Lobbyist walker (2 wheels)  Toileting- Clothing Manipulation and Hygiene Modified independent;Sitting/lateral lean  Functional mobility during ADLs Supervision/safety;Rolling walker (2 wheels)  General ADL Comments significant improvement in activity tolerance this date  Bed Mobility  Overal bed mobility Needs Assistance  Bed Mobility Sit to Supine  Sit to supine Supervision  Transfers  Overall transfer level Needs assistance  Equipment used Rolling walker (2 wheels)  Transfers Sit to/from Stand  Sit to Stand Supervision  Balance  Overall balance assessment No apparent balance deficits (not formally assessed)  General Comments  General comments (skin integrity, edema, etc.) VSS, soft BP but stable and asymptomatic  OT - End of Session  Equipment Utilized During Treatment Rolling walker (2 wheels)  Activity Tolerance Patient tolerated treatment well  Patient left in bed;with call bell/phone within reach;with bed alarm set  Nurse Communication Mobility status  OT Assessment/Plan  OT Visit Diagnosis Unsteadiness on feet (R26.81);Other abnormalities of gait and mobility (R26.89);Muscle weakness (generalized) (M62.81)  OT Frequency (ACUTE ONLY) Min 1X/week  Follow Up Recommendations Home health OT  Patient can return home with the following A little help with walking and/or transfers;A lot of help with bathing/dressing/bathroom;Assistance with cooking/housework;Direct supervision/assist for medications management;Direct supervision/assist for financial management;Help with stairs or ramp for entrance;Assist for transportation  OT Equipment None recommended by OT  AM-PAC OT 6 Clicks Daily Activity Outcome Measure (Version 2)  Help from another person eating meals? 3  Help from another person taking care of personal  grooming? 3  Help from another person toileting, which includes using toliet, bedpan, or urinal? 3  Help from another person bathing (including washing, rinsing, drying)? 3  Help from another person to put on and taking  off regular upper body clothing? 3  Help from another person to put on and taking off regular lower body clothing? 3  6 Click Score 18  Progressive Mobility  What is the highest level of mobility based on the progressive mobility assessment? Level 5 (Walks with assist in room/hall) - Balance while stepping forward/back and can walk in room with assist - Complete  Activity Ambulated with assistance in hallway  OT Goal Progression  Progress towards OT goals Progressing toward goals  Acute Rehab OT Goals  Patient Stated Goal to get better  OT Goal Formulation With patient  Time For Goal Achievement 03/11/23  Potential to Achieve Goals Fair  ADL Goals  Pt Will Perform Grooming with modified independence;standing  Pt Will Perform Lower Body Dressing with modified independence;sit to/from stand  Pt Will Transfer to Toilet with modified independence;ambulating  Additional ADL Goal #1 pt will complete at least 10 minutes of OOB activity to demonstrate improved tolerance for ADLs  Additional ADL Goal #2 Pt will indep recall at least 3 energy conservation strategies to ensure safe transition to home environment at discharge  OT Time Calculation  OT Start Time (ACUTE ONLY) 1505  OT Stop Time (ACUTE ONLY) 1525  OT Time Calculation (min) 20 min  OT General Charges  $OT Visit 1 Visit  OT Treatments  $Therapeutic Activity 8-22 mins

## 2023-02-28 NOTE — Discharge Summary (Addendum)
 PATIENT DETAILS Name: [name] [name] Age: 89 y.o. Sex: female Date of Birth: 06-Mar-1931 MRN: [id number]. Admitting Physician: Marsa KATHEE Scurry, MD ERE:[name], Velna SAUNDERS, MD  Admit Date: 02/23/2023 Discharge date: 02/28/2023  Recommendations for Outpatient Follow-up:  Follow up with PCP in 1-2 weeks Please obtain CMP/CBC in one week Please follow-up with Dr. Stevie as previously scheduled.    Admitted From:  Home  Disposition: Home health   Discharge Condition: good  CODE STATUS:   Code Status: Limited: Do not attempt resuscitation (DNR) -DNR-LIMITED -Do Not Intubate/DNI    Diet recommendation:  Diet Order             Diet - low sodium heart healthy           DIET DYS 3 Room service appropriate? Yes with Assist; Fluid consistency: Thin  Diet effective now                    Brief Summary: Patient is a 89 y.o.  female with history of HTN, HLD, aortic stenosis, COPD, liver cirrhosis, hypothyroidism-who presented to the ED with intractable diarrhea.  She was subsequently admitted to the hospitalist service.   Significant events: 1/27>> laparoscopic right inguinal hernia repair with mesh-umbilical hernia repair 2/02>> to ED with diarrhea-admit to [facility].   Significant studies: 2/02>> CT chest/abdomen/pelvis: New small pleural effusion-atelectasis-possible changes consistent with bronchiolitis-cirrhosis-postoperative findings from recent umbilical hernia and right inguinal hernia repair. 2/06>> MRI LS spine: Multilevel degenerative changes-mild to moderate spinal canal narrowing at L2-L3, L3-L4, L4-L5.   Significant microbiology data: 2/2>> stool C. difficile studies: Negative 2/2>> stool GI pathogen panel: Negative 2/2>> COVID/influenza/RSV PCR: Negative   Procedures: None   Consults: [facility] PCCM Pressure Ulcer:    Brief Hospital Course: Diarrhea Likely viral etiology-her daughter has similar symptoms Stool studies are negative Diarrhea has resolved CT  imaging with possible colitis/proctitis-I suspect this is related to her diarrheal illness. She had 1 normal BM on 2/6.   Hypovolemic hyponatremia Improved with IVF-continues to have mild hyponatremia-she is asymptomatic Repeat electrolytes in 1 week.   Hypokalemia Secondary to GI loss Repleted.   Hypomagnesemia Repleted.   Hypophosphatemia Pleated.   ?  PNA/bronchiolitis Here with diarrhea-does not have any major respiratory issues-no symptoms suggestive of pneumonia-this is mostly an incidental finding on CT imaging. Appreciate PCCM input-resume outpatient follow-up with pulmonology as previously scheduled.   CKD stage IIIb At baseline   CAD s/p PCI 2020 No anginal symptoms Continue Plavix    History of severe AS-s/p TAVR 2021 Supportive care   Chronic HFpEF Euvolemic   COPD Not in exacerbation Continue bronchodilators   Liver cirrhosis/portal hypertension Stable/compensated Prior regimen of diuretics Resume propranolol -prior dose on discharge.   Hypothyroidism Synthroid    Recent/inguinal hernia repair-with postoperative pain Benign abdominal exam-CT scan on admission negative for any significant issues Abdominal pain has significantly improved Continue with as needed oxycodone  Keep next appointment with Gen surgery   Worsening low back pain Ongoing for the past several months but worse for the past several days-suspect this is mostly skeletal-MRI LS spine without any acute abnormalities. Thankfully this has improved today-she has had no major issues overnight-slept most of the night per nursing staff. Continue as needed oxycodone /Flexeril -follow with PCP Home health physical therapy has been ordered.   Debility/deconditioning Secondary to diarrhea-recent hernia repair PT/OT eval-Home health recommended.   BMI: Estimated body mass index is 21.87 kg/m as calculated from the following:   Height as of 02/17/23: 5' (1.524 m).  Weight as of 02/17/23: 50.8  kg.   Nutrition Status: Nutrition Problem: Moderate Malnutrition Etiology: chronic illness Signs/Symptoms: severe fat depletion, moderate fat depletion, severe muscle depletion, moderate muscle depletion Interventions: Ensure Enlive (each supplement provides 350kcal and 20 grams of protein), Magic cup, Snacks   Note-daughter Amy-updated over the phone regarding discharge plans.  Discharge Diagnoses:  Principal Problem:   Hyponatremia Active Problems:   Malnutrition of moderate degree   Discharge Instructions:  Activity:  As tolerated   Discharge Instructions     Call MD for:  difficulty breathing, headache or visual disturbances   Complete by: As directed    Call MD for:  redness, tenderness, or signs of infection (pain, swelling, redness, odor or green/yellow discharge around incision site)   Complete by: As directed    Diet - low sodium heart healthy   Complete by: As directed    Discharge instructions   Complete by: As directed    Follow with Primary MD  Pahwani, Velna SAUNDERS, MD in 1-2 weeks  Follow-up with general surgery-Dr. Stevie as previously scheduled.  Please get a complete blood count and chemistry panel checked by your Primary MD at your next visit, and again as instructed by your Primary MD.  Get Medicines reviewed and adjusted: Please take all your medications with you for your next visit with your Primary MD  Laboratory/radiological data: Please request your Primary MD to go over all hospital tests and procedure/radiological results at the follow up, please ask your Primary MD to get all Hospital records sent to his/her office.  In some cases, they will be blood work, cultures and biopsy results pending at the time of your discharge. Please request that your primary care M.D. follows up on these results.  Also Note the following: If you experience worsening of your admission symptoms, develop shortness of breath, life threatening emergency, suicidal or  homicidal thoughts you must seek medical attention immediately by calling 911 or calling your MD immediately  if symptoms less severe.  You must read complete instructions/literature along with all the possible adverse reactions/side effects for all the Medicines you take and that have been prescribed to you. Take any new Medicines after you have completely understood and accpet all the possible adverse reactions/side effects.   Do not drive when taking Pain medications or sleeping medications (Benzodaizepines)  Do not take more than prescribed Pain, Sleep and Anxiety Medications. It is not advisable to combine anxiety,sleep and pain medications without talking with your primary care practitioner  Special Instructions: If you have smoked or chewed Tobacco  in the last 2 yrs please stop smoking, stop any regular Alcohol   and or any Recreational drug use.  Wear Seat belts while driving.  Please note: You were cared for by a hospitalist during your hospital stay. Once you are discharged, your primary care physician will handle any further medical issues. Please note that NO REFILLS for any discharge medications will be authorized once you are discharged, as it is imperative that you return to your primary care physician (or establish a relationship with a primary care physician if you do not have one) for your post hospital discharge needs so that they can reassess your need for medications and monitor your lab values.   Increase activity slowly   Complete by: As directed       Allergies as of 02/28/2023       Reactions   Aspirin  Anaphylaxis   Bee Venom Anaphylaxis   Ceclor [cefaclor] Anaphylaxis  Simvastatin  Other (See Comments)   Hair loss   Pneumococcal Vac Polyvalent Swelling   Other reaction(s): swelling and erythema   Statins Other (See Comments)   Elevated liver enzymes   Crestor  [rosuvastatin ] Rash   Sulfa Antibiotics Rash        Medication List     STOP taking these  medications    HYDROcodone -acetaminophen  5-325 MG tablet Commonly known as: NORCO/VICODIN       TAKE these medications    albuterol  108 (90 Base) MCG/ACT inhaler Commonly known as: VENTOLIN  HFA Inhale 2 puffs into the lungs every 6 (six) hours as needed for shortness of breath (use as needed or before activity for shortness of breath or whezzing).   atorvastatin  20 MG tablet Commonly known as: LIPITOR Take 1 tablet (20 mg total) by mouth daily.   Biotene Dry Mouth Lozg Use as directed 1 Piece in the mouth or throat as needed (dry mouth).   clopidogrel  75 MG tablet Commonly known as: PLAVIX  TAKE 1 TABLET BY MOUTH EVERY DAY   cyclobenzaprine  5 MG tablet Commonly known as: FLEXERIL  Take 1 tablet (5 mg total) by mouth 3 (three) times daily as needed for up to 10 days for muscle spasms.   diphenhydrAMINE  25 mg capsule Commonly known as: BENADRYL  Take 25 mg by mouth every 6 (six) hours as needed for allergies.   fexofenadine 180 MG tablet Commonly known as: ALLEGRA Take 180 mg by mouth daily as needed for allergies or rhinitis.   fluticasone  50 MCG/ACT nasal spray Commonly known as: FLONASE  Place 2 sprays into both nostrils at bedtime.   furosemide  20 MG tablet Commonly known as: LASIX  Take 1 tablet (20 mg total) by mouth as needed. What changed: when to take this   levothyroxine  50 MCG tablet Commonly known as: SYNTHROID  Take 50 mcg by mouth daily before breakfast. In am   montelukast  10 MG tablet Commonly known as: SINGULAIR  Take 1 tablet (10 mg total) by mouth at bedtime.   multivitamin with minerals Tabs tablet Take 1 tablet by mouth every other day.   ondansetron  4 MG disintegrating tablet Commonly known as: ZOFRAN -ODT Take 4 mg by mouth every 8 (eight) hours as needed for nausea.   oxyCODONE  5 MG immediate release tablet Commonly known as: Oxy IR/ROXICODONE  Take 1 tablet (5 mg total) by mouth every 6 (six) hours as needed for moderate pain (pain score  4-6).   Peak Flow Meter Devi 1 puff by Does not apply route in the morning and at bedtime.   potassium chloride  10 MEQ tablet Commonly known as: KLOR-CON  Take 1 tablet (10 mEq total) by mouth daily. What changed: when to take this   propranolol  20 MG tablet Commonly known as: INDERAL  Take 1 tablet (20 mg total) by mouth 3 (three) times daily. What changed: when to take this   sodium chloride  0.65 % nasal spray Commonly known as: OCEAN Place 1 spray into the nose every other day.   Stiolto Respimat  2.5-2.5 MCG/ACT Aers Generic drug: Tiotropium Bromide-Olodaterol Inhale 2 puffs into the lungs daily.   Systane 0.4-0.3 % Soln Generic drug: Polyethyl Glycol-Propyl Glycol Place 1 drop into both eyes daily as needed (dry/irritated eyes.).        Follow-up Information     Pahwani, Velna SAUNDERS, MD. Schedule an appointment as soon as possible for a visit in 1 week(s).   Specialty: Internal Medicine Contact information: [address] [place] [address] [phone]  Kinsinger, Herlene Righter, MD Follow up.   Specialty: General Surgery Why: keep existing appt Contact information: [address] [place] [address] [phone]                Allergies  Allergen Reactions   Aspirin  Anaphylaxis   Bee Venom Anaphylaxis   Ceclor [Cefaclor] Anaphylaxis   Simvastatin  Other (See Comments)    Hair loss   Pneumococcal Vac Polyvalent Swelling    Other reaction(s): swelling and erythema   Statins Other (See Comments)    Elevated liver enzymes   Crestor  [Rosuvastatin ] Rash   Sulfa Antibiotics Rash     Other Procedures/Studies: MR LUMBAR SPINE WO CONTRAST Result Date: 02/27/2023 CLINICAL DATA:  Low back pain, symptoms persist with > 6 wks treatment EXAM: MRI LUMBAR SPINE WITHOUT CONTRAST TECHNIQUE: Multiplanar, multisequence MR imaging of the lumbar spine was performed. No intravenous contrast was administered. COMPARISON:  None  Available. FINDINGS: Segmentation:  Standard. Alignment: Mild retrolisthesis of L2 on L3 and L3 on L4. Grade 1 anterolisthesis of L4 on L5. Vertebrae: No fracture, evidence of discitis, or bone lesion. Unchanged T2 hyperintense lesion along the posterior aspect of the T2 vertebral body compared to 09/20/2016 Conus medullaris and cauda equina: Conus extends to the L1-L2 disc space level. Conus and cauda equina appear normal. Paraspinal and other soft tissues: There is a small amount of layering fluid along the retroperitoneum on the right, new from 02/23/2023. This is nonspecific and may be postsurgical in nature correlate with operative history. Alternatively, this could also represent a small right retroperitoneal hematoma. Disc levels: T12-L1: Mild bilateral facet degenerative change. No spinal canal or neural foraminal narrowing L1-L2: Minimal disc bulge. Mild bilateral facet degenerative change. Mild spinal canal narrowing. No significant neural foraminal narrowing. L2-L3: Moderate bilateral facet degenerative change. Central disc protrusion. Mild-to-moderate overall spinal canal narrowing. Mild left and moderate right neural foraminal narrowing. L3-L4: Circumferential disc bulge. Ligamentum flavum hypertrophy. Mild bilateral facet degenerative change. Mild-to-moderate spinal canal narrowing. Moderate bilateral neural foraminal narrowing. L4-L5: Moderate bilateral facet degenerative change. Ligamentum flavum hypertrophy. Circumferential disc bulge. Mild-to-moderate spinal canal narrowing. Mild bilateral neural foraminal narrowing. L5-S1: Severe left and moderate right facet degenerative change. No significant disc bulge. No spinal canal narrowing. Mild bilateral neural foraminal narrowing. IMPRESSION: 1. Multilevel degenerative changes of the lumbar spine with mild-to-moderate spinal canal narrowing at L2-L3, L3-L4, and L4-L5. 2. Moderate neural foraminal narrowing on the right at L2-L3 and bilaterally at L3-L4.  3. Small amount of layering fluid along the retroperitoneum on the right, new from 02/23/2023. This is nonspecific and may be postsurgical in nature. Correlate with operative history. Alternatively, this could also represent a small right retroperitoneal hematoma. Electronically Signed   By: Lyndall Gore M.D.   On: 02/27/2023 12:52   DG Chest Port 1V same Day Result Date: 02/25/2023 CLINICAL DATA:  Shortness of breath. EXAM: PORTABLE CHEST 1 VIEW COMPARISON:  CT chest and chest x-ray dated February 23, 2023. FINDINGS: Stable normal heart size status post TAVR. Unchanged large hiatal hernia. Similar left lower lobe atelectasis adjacent to the hernia. The right lung is clear. No pleural effusion or pneumothorax. No acute osseous abnormality. IMPRESSION: 1. No acute cardiopulmonary disease. 2. Unchanged large hiatal hernia. Electronically Signed   By: Elsie ONEIDA Shoulder M.D.   On: 02/25/2023 16:37   CT CHEST ABDOMEN PELVIS W CONTRAST Result Date: 02/23/2023 CLINICAL DATA:  Sepsis, abdominal pain, shortness of breath, recent hernia surgery. COPD. EXAM: CT CHEST, ABDOMEN, AND PELVIS  WITH CONTRAST TECHNIQUE: Multidetector CT imaging of the chest, abdomen and pelvis was performed following the standard protocol during bolus administration of intravenous contrast. RADIATION DOSE REDUCTION: This exam was performed according to the departmental dose-optimization program which includes automated exposure control, adjustment of the mA and/or kV according to patient size and/or use of iterative reconstruction technique. CONTRAST:  85mL OMNIPAQUE  IOHEXOL  300 MG/ML  SOLN COMPARISON:  Multiple exams, including 11/26/2022 12/25/2020 FINDINGS: CT CHEST FINDINGS Cardiovascular: Coronary, aortic arch, and branch vessel atherosclerotic vascular disease. TAVR noted. Mitral valve calcification. Mediastinum/Nodes: No pathologic adenopathy. Large hiatal hernia contains most of the stomach and bulges into the left posterior hemithorax, with  mixed rotation. Lungs/Pleura: New small left pleural effusion and increased atelectasis and possibly mild superimposed pneumonia in the left lower lobe adjacent to the large hiatal hernia. Mild biapical pleuroparenchymal scarring. 9 by 4 mm crescentic nodule in the right middle lobe on image 89 series 4, similar sized on 01/01/2019 most compatible with a benign etiology. Reticulonodular opacity posteriorly in the right upper lobe with a tree-in-bud morphology on image 49 series 4 favoring atypical infectious bronchiolitis. Similar findings peripherally in the left upper lobe for example on image 49 series 4. Musculoskeletal: Remote 40% superior endplate compression fracture T12. Mild dextroconvex lower thoracic scoliosis. CT ABDOMEN PELVIS FINDINGS Hepatobiliary: Nodular contour compatible cirrhosis. No discrete mass lesion in the liver is observed. Cholecystectomy. Pancreas: Mild fatty atrophy along portions of the pancreatic head. Spleen: Unremarkable Adrenals/Urinary Tract: Small hypodense bilateral renal lesions are technically too small to characterize although statistically likely to be benign. No further imaging workup of these lesions is indicated. Adrenal glands appear normal. Stomach/Bowel: Air-levels in the distal colon favoring diarrheal process. Small diverticulum of the distal sigmoid colon. Mild wall thickening and mucosal enhancement in the rectum with perirectal edema, parents favors proctitis. No extraluminal gas or abscess. No pneumatosis. Appendix unremarkable. Vascular/Lymphatic: Atherosclerosis is present, including aortoiliac atherosclerotic disease. Patent celiac trunk and SMV. The inferior mesenteric artery opacifies favoring patency. Reproductive: Uterus absent. Other: Postoperative findings from recent umbilical hernia repair, with a small amount of edema and a small amount of gas along the subcutaneous repair site, and a small amount of edema in the omentum just below the repair site.  Small amount of fluid in gas along the right spermatic cord, likely incidental postoperative findings related to the patient's right inguinal hernia repair. No current herniated bowel. Musculoskeletal: Grade 1 degenerative retrolisthesis at L2-3 and grade 1 degenerative anterolisthesis at L4-5. Levoconvex lumbar scoliosis with rotary component. Moderate degenerative hip arthropathy bilaterally. Left foraminal impingement at L3-4 and right foraminal impingement at L2-3 due to facet spurring. Degenerative endplate findings at L2-3. IMPRESSION: 1. New small left pleural effusion and increased atelectasis and possibly mild superimposed pneumonia in the left lower lobe adjacent to the large hiatal hernia. 2. Reticulonodular opacity posteriorly in the right upper lobe with a tree-in-bud morphology compatible with atypical infectious bronchiolitis. Similar findings peripherally in the left upper lobe. 3. Wall thickening and mucosal enhancement in the rectum with perirectal edema, favoring proctitis. 4. Air-levels in the distal colon favoring diarrheal process. 5. Cirrhosis. 6. Postoperative findings from recent umbilical hernia repair and right inguinal hernia repair. No current herniated bowel. 7. Remote 40% superior endplate compression fracture T12. 8. Degenerative spondylosis and degenerative disc disease. 9.  Aortic Atherosclerosis (ICD10-I70.0). Electronically Signed   By: Ryan Salvage M.D.   On: 02/23/2023 16:18   DG Chest Portable 1 View Result Date: 02/23/2023 CLINICAL DATA:  Shortness  of breath and back pain. EXAM: PORTABLE CHEST 1 VIEW COMPARISON:  10/22/2021 FINDINGS: Stable cardiomediastinal contours. Status post TAVR. Aortic atherosclerosis. Large hiatal hernia is again noted and obscures the retrocardiac portions of the left lower lung. No pleural effusion, interstitial edema or airspace consolidation. Mild curvature of the lower thoracic spine is convex towards the right. IMPRESSION: 1. No acute  cardiopulmonary disease. 2. Large hiatal hernia. Electronically Signed   By: Waddell Calk M.D.   On: 02/23/2023 13:37     TODAY-DAY OF DISCHARGE:  Subjective:   [name] [name] today has no headache,no chest abdominal pain,no new weakness tingling or numbness, feels much better wants to go home today.   Objective:   Blood pressure (!) 86/71, pulse 85, temperature (!) 97.4 F (36.3 C), temperature source Oral, resp. rate 10, SpO2 94%. No intake or output data in the 24 hours ending 02/28/23 0929 There were no vitals filed for this visit.  Exam: Awake Alert, Oriented *3, No new F.N deficits, Normal affect [place].AT,PERRAL Supple Neck,No JVD, No cervical lymphadenopathy appriciated.  Symmetrical Chest wall movement, Good air movement bilaterally, CTAB RRR,No Gallops,Rubs or new Murmurs, No Parasternal Heave +ve B.Sounds, Abd Soft, Non tender, No organomegaly appriciated, No rebound -guarding or rigidity. No Cyanosis, Clubbing or edema, No new Rash or bruise   PERTINENT RADIOLOGIC STUDIES: MR LUMBAR SPINE WO CONTRAST Result Date: 02/27/2023 CLINICAL DATA:  Low back pain, symptoms persist with > 6 wks treatment EXAM: MRI LUMBAR SPINE WITHOUT CONTRAST TECHNIQUE: Multiplanar, multisequence MR imaging of the lumbar spine was performed. No intravenous contrast was administered. COMPARISON:  None Available. FINDINGS: Segmentation:  Standard. Alignment: Mild retrolisthesis of L2 on L3 and L3 on L4. Grade 1 anterolisthesis of L4 on L5. Vertebrae: No fracture, evidence of discitis, or bone lesion. Unchanged T2 hyperintense lesion along the posterior aspect of the T2 vertebral body compared to 09/20/2016 Conus medullaris and cauda equina: Conus extends to the L1-L2 disc space level. Conus and cauda equina appear normal. Paraspinal and other soft tissues: There is a small amount of layering fluid along the retroperitoneum on the right, new from 02/23/2023. This is nonspecific and may be postsurgical in nature  correlate with operative history. Alternatively, this could also represent a small right retroperitoneal hematoma. Disc levels: T12-L1: Mild bilateral facet degenerative change. No spinal canal or neural foraminal narrowing L1-L2: Minimal disc bulge. Mild bilateral facet degenerative change. Mild spinal canal narrowing. No significant neural foraminal narrowing. L2-L3: Moderate bilateral facet degenerative change. Central disc protrusion. Mild-to-moderate overall spinal canal narrowing. Mild left and moderate right neural foraminal narrowing. L3-L4: Circumferential disc bulge. Ligamentum flavum hypertrophy. Mild bilateral facet degenerative change. Mild-to-moderate spinal canal narrowing. Moderate bilateral neural foraminal narrowing. L4-L5: Moderate bilateral facet degenerative change. Ligamentum flavum hypertrophy. Circumferential disc bulge. Mild-to-moderate spinal canal narrowing. Mild bilateral neural foraminal narrowing. L5-S1: Severe left and moderate right facet degenerative change. No significant disc bulge. No spinal canal narrowing. Mild bilateral neural foraminal narrowing. IMPRESSION: 1. Multilevel degenerative changes of the lumbar spine with mild-to-moderate spinal canal narrowing at L2-L3, L3-L4, and L4-L5. 2. Moderate neural foraminal narrowing on the right at L2-L3 and bilaterally at L3-L4. 3. Small amount of layering fluid along the retroperitoneum on the right, new from 02/23/2023. This is nonspecific and may be postsurgical in nature. Correlate with operative history. Alternatively, this could also represent a small right retroperitoneal hematoma. Electronically Signed   By: Lyndall Gore M.D.   On: 02/27/2023 12:52     PERTINENT LAB RESULTS: CBC: Recent  Labs    02/26/23 0445  WBC 8.0  HGB 10.3*  HCT 30.5*  PLT 224   CMET CMP     Component Value Date/Time   NA 130 (L) 02/28/2023 0543   NA 132 (L) 07/26/2021 1222   K 3.9 02/28/2023 0543   CL 100 02/28/2023 0543   CO2 23  02/28/2023 0543   GLUCOSE 107 (H) 02/28/2023 0543   BUN 14 02/28/2023 0543   BUN 27 07/26/2021 1222   CREATININE 1.36 (H) 02/28/2023 0543   CREATININE 1.52 (H) 11/23/2021 1435   CALCIUM  8.3 (L) 02/28/2023 0543   PROT 7.0 02/23/2023 1357   PROT 7.7 12/24/2021 1555   ALBUMIN  3.3 (L) 02/23/2023 1357   ALBUMIN  4.4 12/24/2021 1555   AST 41 02/23/2023 1357   AST 75 (H) 11/23/2021 1435   ALT 33 02/23/2023 1357   ALT 43 11/23/2021 1435   ALKPHOS 315 (H) 02/23/2023 1357   BILITOT 1.2 02/23/2023 1357   BILITOT 0.8 12/24/2021 1555   BILITOT 0.6 11/23/2021 1435   EGFR 37 (L) 07/26/2021 1222   GFRNONAA 37 (L) 02/28/2023 0543   GFRNONAA 33 (L) 11/23/2021 1435    GFR Estimated Creatinine Clearance: 20.1 mL/min (A) (by C-G formula based on SCr of 1.36 mg/dL (H)). No results for input(s): LIPASE, AMYLASE in the last 72 hours. No results for input(s): CKTOTAL, CKMB, CKMBINDEX, TROPONINI in the last 72 hours. Invalid input(s): POCBNP No results for input(s): DDIMER in the last 72 hours. No results for input(s): HGBA1C in the last 72 hours. No results for input(s): CHOL, HDL, LDLCALC, TRIG, CHOLHDL, LDLDIRECT in the last 72 hours. No results for input(s): TSH, T4TOTAL, T3FREE, THYROIDAB in the last 72 hours.  Invalid input(s): FREET3 No results for input(s): VITAMINB12, FOLATE, FERRITIN, TIBC, IRON, RETICCTPCT in the last 72 hours. Coags: No results for input(s): INR in the last 72 hours.  Invalid input(s): PT Microbiology: Recent Results (from the past 240 hours)  Resp panel by RT-PCR (RSV, Flu A&B, Covid) Anterior Nasal Swab     Status: None   Collection Time: 02/23/23  1:57 PM   Specimen: Anterior Nasal Swab  Result Value Ref Range Status   SARS Coronavirus 2 by RT PCR NEGATIVE NEGATIVE Final    Comment: (NOTE) SARS-CoV-2 target nucleic acids are NOT DETECTED.  The SARS-CoV-2 RNA is generally detectable in upper  respiratory specimens during the acute phase of infection. The lowest concentration of SARS-CoV-2 viral copies this assay can detect is 138 copies/mL. A negative result does not preclude SARS-Cov-2 infection and should not be used as the sole basis for treatment or other patient management decisions. A negative result may occur with  improper specimen collection/handling, submission of specimen other than nasopharyngeal swab, presence of viral mutation(s) within the areas targeted by this assay, and inadequate number of viral copies(<138 copies/mL). A negative result must be combined with clinical observations, patient history, and epidemiological information. The expected result is Negative.  Fact Sheet for Patients:  bloggercourse.com  Fact Sheet for Healthcare Providers:  seriousbroker.it  This test is no t yet approved or cleared by the [place]  FDA and  has been authorized for detection and/or diagnosis of SARS-CoV-2 by FDA under an Emergency Use Authorization (EUA). This EUA will remain  in effect (meaning this test can be used) for the duration of the COVID-19 declaration under Section 564(b)(1) of the Act, 21 U.S.C.section 360bbb-3(b)(1), unless the authorization is terminated  or revoked sooner.       Influenza  A by PCR NEGATIVE NEGATIVE Final   Influenza B by PCR NEGATIVE NEGATIVE Final    Comment: (NOTE) The Xpert Xpress SARS-CoV-2/FLU/RSV plus assay is intended as an aid in the diagnosis of influenza from Nasopharyngeal swab specimens and should not be used as a sole basis for treatment. Nasal washings and aspirates are unacceptable for Xpert Xpress SARS-CoV-2/FLU/RSV testing.  Fact Sheet for Patients: bloggercourse.com  Fact Sheet for Healthcare Providers: seriousbroker.it  This test is not yet approved or cleared by the [place]  FDA and has been  authorized for detection and/or diagnosis of SARS-CoV-2 by FDA under an Emergency Use Authorization (EUA). This EUA will remain in effect (meaning this test can be used) for the duration of the COVID-19 declaration under Section 564(b)(1) of the Act, 21 U.S.C. section 360bbb-3(b)(1), unless the authorization is terminated or revoked.     Resp Syncytial Virus by PCR NEGATIVE NEGATIVE Final    Comment: (NOTE) Fact Sheet for Patients: bloggercourse.com  Fact Sheet for Healthcare Providers: seriousbroker.it  This test is not yet approved or cleared by the [place]  FDA and has been authorized for detection and/or diagnosis of SARS-CoV-2 by FDA under an Emergency Use Authorization (EUA). This EUA will remain in effect (meaning this test can be used) for the duration of the COVID-19 declaration under Section 564(b)(1) of the Act, 21 U.S.C. section 360bbb-3(b)(1), unless the authorization is terminated or revoked.  Performed at [facility], [address], [place], [place] [address]   Gastrointestinal Panel by PCR , Stool     Status: None   Collection Time: 02/23/23  1:57 PM   Specimen: Urine, Clean Catch; Stool  Result Value Ref Range Status   Campylobacter species NOT DETECTED NOT DETECTED Final   Plesimonas shigelloides NOT DETECTED NOT DETECTED Final   Salmonella species NOT DETECTED NOT DETECTED Final   Yersinia enterocolitica NOT DETECTED NOT DETECTED Final   Vibrio species NOT DETECTED NOT DETECTED Final   Vibrio cholerae NOT DETECTED NOT DETECTED Final   Enteroaggregative E coli (EAEC) NOT DETECTED NOT DETECTED Final   Enteropathogenic E coli (EPEC) NOT DETECTED NOT DETECTED Final   Enterotoxigenic E coli (ETEC) NOT DETECTED NOT DETECTED Final   Shiga like toxin producing E coli (STEC) NOT DETECTED NOT DETECTED Final   Shigella/Enteroinvasive E coli (EIEC) NOT DETECTED NOT DETECTED Final    Cryptosporidium NOT DETECTED NOT DETECTED Final   Cyclospora cayetanensis NOT DETECTED NOT DETECTED Final   Entamoeba histolytica NOT DETECTED NOT DETECTED Final   Giardia lamblia NOT DETECTED NOT DETECTED Final   Adenovirus F40/41 NOT DETECTED NOT DETECTED Final   Astrovirus NOT DETECTED NOT DETECTED Final   Norovirus GI/GII NOT DETECTED NOT DETECTED Final   Rotavirus A NOT DETECTED NOT DETECTED Final   Sapovirus (I, II, IV, and V) NOT DETECTED NOT DETECTED Final    Comment: Performed at [facility], [address] [place], [place] [address]  C Difficile Quick Screen w PCR reflex     Status: None   Collection Time: 02/23/23  3:00 PM  Result Value Ref Range Status   C Diff antigen NEGATIVE NEGATIVE Final   C Diff toxin NEGATIVE NEGATIVE Final   C Diff interpretation No C. difficile detected.  Final    Comment: Performed at [facility], [address] [place], [place] [address]    FURTHER DISCHARGE INSTRUCTIONS:  Get Medicines reviewed and adjusted: Please take all your medications with you for your next visit with your Primary MD  Laboratory/radiological data: Please request your Primary MD to go over all hospital tests and procedure/radiological results at the follow up, please ask your Primary MD to get all Hospital records sent to his/her office.  In some cases, they will be blood work, cultures and biopsy results pending at the time of your discharge. Please request that your primary care M.D. goes through all the records of your hospital data and follows up on these results.  Also Note the following: If you experience worsening of your admission symptoms, develop shortness of breath, life threatening emergency, suicidal or homicidal thoughts you must seek medical attention immediately by calling 911 or calling your MD immediately  if symptoms less severe.  You must read complete instructions/literature along with all the possible adverse reactions/side effects  for all the Medicines you take and that have been prescribed to you. Take any new Medicines after you have completely understood and accpet all the possible adverse reactions/side effects.   Do not drive when taking Pain medications or sleeping medications (Benzodaizepines)  Do not take more than prescribed Pain, Sleep and Anxiety Medications. It is not advisable to combine anxiety,sleep and pain medications without talking with your primary care practitioner  Special Instructions: If you have smoked or chewed Tobacco  in the last 2 yrs please stop smoking, stop any regular Alcohol   and or any Recreational drug use.  Wear Seat belts while driving.  Please note: You were cared for by a hospitalist during your hospital stay. Once you are discharged, your primary care physician will handle any further medical issues. Please note that NO REFILLS for any discharge medications will be authorized once you are discharged, as it is imperative that you return to your primary care physician (or establish a relationship with a primary care physician if you do not have one) for your post hospital discharge needs so that they can reassess your need for medications and monitor your lab values.  Total Time spent coordinating discharge including counseling, education and face to face time equals greater than 30 minutes.  SignedBETHA Donalda Applebaum 02/28/2023 9:29 AM

## 2023-03-01 NOTE — Telephone Encounter (Signed)
 That is fine, Coop did her TAVR.

## 2023-03-03 ENCOUNTER — Encounter: Payer: Self-pay | Admitting: Cardiovascular Disease

## 2023-03-03 ENCOUNTER — Ambulatory Visit: Payer: Federal, State, Local not specified - PPO | Attending: Cardiovascular Disease | Admitting: Cardiovascular Disease

## 2023-03-03 VITALS — BP 110/70 | HR 79 | Ht 60.0 in | Wt 123.2 lb

## 2023-03-03 DIAGNOSIS — I5033 Acute on chronic diastolic (congestive) heart failure: Secondary | ICD-10-CM | POA: Diagnosis not present

## 2023-03-03 DIAGNOSIS — Z79899 Other long term (current) drug therapy: Secondary | ICD-10-CM | POA: Diagnosis not present

## 2023-03-03 NOTE — Patient Instructions (Signed)
 Medication Instructions:  Take 40 mg Lasix  TODAY 2/10, TOMORROW 2/11 and [facility] 2/12, then take 20 mg once daily Increase Potassium Chlordie to 10 meq twice daily *If you need a refill on your cardiac medications before your next appointment, please call your pharmacy*   Lab Work: [facility] Thursday 2/13 If you have labs (blood work) drawn today and your tests are completely normal, you will receive your results only by: MyChart Message (if you have MyChart) OR A paper copy in the mail If you have any lab test that is abnormal or we need to change your treatment, we will call you to review the results.   Follow-Up: At [facility], you and your health needs are our priority.  As part of our continuing mission to provide you with exceptional heart care, we have created designated Provider Care Teams.  These Care Teams include your primary Cardiologist (physician) and Advanced Practice Providers (APPs -  Physician Assistants and Nurse Practitioners) who all work together to provide you with the care you need, when you need it.  We recommend signing up for the patient portal called "MyChart".  Sign up information is provided on this After Visit Summary.  MyChart is used to connect with patients for Virtual Visits (Telemedicine).  Patients are able to view lab/test results, encounter notes, upcoming appointments, etc.  Non-urgent messages can be sent to your provider as well.   To learn more about what you can do with MyChart, go to [email or web address].    Your next appointment:   4 week(s)  Provider:   APP or Ganji in 4 weeks  Other Instructions Please send MyChart message Thursday 2/13 AM with update in daily weights    1st Floor: - Lobby - Registration  - Pharmacy  - Lab - Cafe  2nd Floor: - PV Lab - Diagnostic Testing (echo, CT, nuclear med)  3rd Floor: - Vacant  4th Floor: - TCTS (cardiothoracic surgery) - AFib Clinic - Structural Heart Clinic - Vascular  Surgery  - Vascular Ultrasound  5th Floor: - HeartCare Cardiology (general and EP) - Clinical Pharmacy for coumadin, hypertension, lipid, weight-loss medications, and med management appointments    Valet parking services will be available as well.

## 2023-03-03 NOTE — Progress Notes (Signed)
 Cardiology Office Note:    Date:  03/03/2023   ID:  [name] [name], DOB February 02, 1931, MRN [id number]  PCP:  Elester Grim, MD   [facility] Providers Cardiologist:  Knox Perl, MD Structural Heart:  Antoinette Batman, MD    Referring MD: Elester Grim, MD   Chief Complaint  Patient presents with   Shortness of Breath    History of Present Illness:    [name] [name] is a 89 y.o. female presenting for evaluation of progressive edema.  The patient was recently hospitalized from February 2 through February 28, 2023.  She was noted to have hyponatremia and she was treated with IV fluids felt to be hypovolemic hyponatremia in the setting of diarrhea.  She also has a history of liver cirrhosis and portal hypertension.  The patient has been followed by Dr. Berry Bristol.  She was last seen by him in April 2024 and was felt to be stable at that time.  She has a history of coronary artery disease and has undergone LAD stenting.  She has a history of severe aortic stenosis and underwent TAVR in 2021.  Her last echocardiogram from 2024 showed an LVEF of 60 to 65%, normal RV size and function, normal PA pressures, mild mitral regurgitation, and normal function of her TAVR prosthesis.  The patient's daughter provides much of the history today.  She reports that the patient's weight is up at least 10 pounds from when she went into the hospital.  She has developed progressive leg swelling, shortness of breath, and abdominal swelling.  She is short of breath with low-level activity.  No orthopnea or PND.  No chest pain or pressure.  Her Lasix  was changed to as needed use in the hospital.  She has taken the 20 mg dose for the past 2 days. She complains of a rash on her legs.  She has no fevers or chills.  Current Medications: Current Meds  Medication Sig   albuterol  (VENTOLIN  HFA) 108 (90 Base) MCG/ACT inhaler Inhale 2 puffs into the lungs every 6 (six) hours as needed for shortness of breath (use as  needed or before activity for shortness of breath or whezzing).   Artificial Saliva (BIOTENE DRY MOUTH) LOZG Use as directed 1 Piece in the mouth or throat as needed (dry mouth).   atorvastatin  (LIPITOR) 20 MG tablet Take 1 tablet (20 mg total) by mouth daily. (Patient taking differently: Take 20 mg by mouth at bedtime.)   clopidogrel  (PLAVIX ) 75 MG tablet TAKE 1 TABLET BY MOUTH EVERY DAY   cyclobenzaprine  (FLEXERIL ) 5 MG tablet Take 1 tablet (5 mg total) by mouth 3 (three) times daily as needed for up to 10 days for muscle spasms.   diphenhydrAMINE  (BENADRYL ) 25 mg capsule Take 25 mg by mouth every 6 (six) hours as needed for allergies.   fexofenadine (ALLEGRA) 180 MG tablet Take 180 mg by mouth daily as needed for allergies or rhinitis.   fluticasone  (FLONASE ) 50 MCG/ACT nasal spray Place 2 sprays into both nostrils at bedtime.   furosemide  (LASIX ) 20 MG tablet Take 1 tablet (20 mg total) by mouth as needed. (Patient taking differently: Take 20 mg by mouth every Monday, Wednesday, and Friday.)   levothyroxine  (SYNTHROID ) 50 MCG tablet Take 50 mcg by mouth daily before breakfast. In am   montelukast  (SINGULAIR ) 10 MG tablet Take 1 tablet (10 mg total) by mouth at bedtime.   Multiple Vitamin (MULTIVITAMIN WITH MINERALS) TABS tablet Take 1 tablet by mouth  every other day.   oxyCODONE  (OXY IR/ROXICODONE ) 5 MG immediate release tablet Take 1 tablet (5 mg total) by mouth every 6 (six) hours as needed for moderate pain (pain score 4-6).   Peak Flow Meter DEVI 1 puff by Does not apply route in the morning and at bedtime.   Polyethyl Glycol-Propyl Glycol (SYSTANE) 0.4-0.3 % SOLN Place 1 drop into both eyes daily as needed (dry/irritated eyes.).   potassium chloride  (KLOR-CON ) 10 MEQ tablet Take 1 tablet (10 mEq total) by mouth daily. (Patient taking differently: Take 10 mEq by mouth every Monday, Wednesday, and Friday.)   propranolol  (INDERAL ) 20 MG tablet Take 1 tablet (20 mg total) by mouth 3 (three)  times daily. (Patient taking differently: Take 20 mg by mouth 2 (two) times daily.)   sodium chloride  (OCEAN) 0.65 % nasal spray Place 1 spray into the nose every other day.   Spacer/Aero-Holding Chambers (AEROCHAMBER PLS FLOVU MTHPIECE) DEVI    Tiotropium Bromide-Olodaterol (STIOLTO RESPIMAT ) 2.5-2.5 MCG/ACT AERS Inhale 2 puffs into the lungs daily.     Allergies:   Aspirin , Bee venom, Ceclor [cefaclor], Simvastatin , Pneumococcal vac polyvalent, Statins, Crestor  [rosuvastatin ], and Sulfa antibiotics   ROS:   Please see the history of present illness.    All other systems reviewed and are negative.  EKGs/Labs/Other Studies Reviewed:    The following studies were reviewed today: Cardiac Studies & Procedures   CARDIAC CATHETERIZATION  CARDIAC CATHETERIZATION 01/05/2019  Narrative Left Heart Catheterization 01/05/19: Successful orbital atherectomy followed by stenting of the proximal LAD with a 3.0 x 18 mm resolute Onyx DES and proximal segment postdilated with a 3.5 x 8 mm Sapphire DeRidder.  Stenosis 90% to 0%.  TIMI-3 to TIMI-3 flow.  Orbital atherectomy followed by balloon angioplasty with 2.5 x 15 mm Sapphire balloon for D1, 90% to less than 15 to 20% stenosis.  TIMI-3 to TIMI-3 flow.  Recommendation: Patient will be observed overnight in view of right forearm hematoma.  She also has had right groin access which is closed with minx and will be observed for any hematoma development as she had mild oozing which was extracted in the lab.  She will be scheduled for TAVR as per structural heart disease team.  120 mL contrast utilized.  Findings Coronary Findings Diagnostic  Dominance: Left  Left Anterior Descending Prox LAD lesion is 80% stenosed. Mid LAD lesion is 40% stenosed.  First Diagonal Branch 1st Diag lesion is 90% stenosed. The lesion is type C and located at the major branch. The lesion is calcified. The stenosis was measured by a visual reading.  Ramus Intermedius Ramus  lesion is 90% stenosed.  Intervention  Prox LAD lesion Atherectomy CATH VISTA GUIDE 6FR XB3.5 guide catheter was inserted. WIRE COUGAR XT STRL 190CM guidewire was used to cross lesion. Orbital atherectomy was performed using a CROWN DIAMONDBACK CLASSIC 1.25. 5 passes taken. Stent Pre-stent angioplasty was performed using a BALLOON SAPPHIRE 2.5X15. Maximum pressure:  12 atm. Inflation time:  60 sec. A drug-eluting stent was successfully placed using a STENT RESOLUTE ONYX 3.0X18. Maximum pressure: 12 atm. Inflation time: 60 sec. Stent strut is well apposed. Post-stent angioplasty was performed using a BALLOON SAPPHIRE Standard 3.5X8. Maximum pressure:  14 atm. Inflation time:  60 sec. Post-Intervention Lesion Assessment The intervention was successful. Pre-interventional TIMI flow is 3. Post-intervention TIMI flow is 3. Treated lesion length:  12 mm. No complications occurred at this lesion. There is a 0% residual stenosis post intervention.  1st Diag lesion Atherectomy CATH  VISTA GUIDE 6FR XB3.5 guide catheter was inserted. WIRE VIPERWIRE COR FLEX TIP guidewire was used to cross lesion. Orbital atherectomy was performed using a CROWN DIAMONDBACK CLASSIC 1.25. 4 passes taken. Angioplasty Lesion length:  5 mm. WIRE COUGAR XT STRL 190CM guidewire used to cross lesion. Balloon angioplasty was performed using a BALLOON SAPPHIRE 2.5X15. Maximum pressure: 12 atm. Inflation time: 60 sec. Post-Intervention Lesion Assessment The intervention was successful. Pre-interventional TIMI flow is 3. Post-intervention TIMI flow is 3. No complications occurred at this lesion. There is a 20% residual stenosis post intervention.   CARDIAC CATHETERIZATION  CARDIAC CATHETERIZATION 12/15/2018  Narrative Right and left heart catheterization 12/15/2018: RA 5/5, mean 5; RV 31/3, EDP 6; PA 32/14, mean 21, PA saturation 67%; PW 11/10, mean 10 mmHg.  CO 3.84, CI  2.5756. QP/QS 1.0.  Normal right heart  catheterization. Mildly elevated LVEDP at 15 mmHg.  Peak aortic valve gradient of 27, mean 23.6 mmHg with calculated aortic valve area 0.76 cm.  Findings consistent with severe calcific degenerative aortic valve stenosis. Left dominant circulation, proximal LAD 80% stenosis, large D1 with ostial 90% stenosis, medium sized RI with a proximal 90% stenosis, dominant circumflex mild disease.  RCA mild disease and nondominant.  Recommendation: Patient will need to be evaluated for CABG and aortic valve replacement.  If the TAVR team feels she is a good candidate for TAVR, we could certainly consider either medical management of CAD or PCI to proximal LAD and TAVR.  Patient be discharged home today with outpatient follow-up.  Patient has been referred to be evaluated by Dr. Linder Revere. 30 mL contrast utilized.  Findings Coronary Findings Diagnostic  Dominance: Left  Left Anterior Descending Prox LAD lesion is 80% stenosed. Mid LAD lesion is 40% stenosed.  First Diagonal Branch 1st Diag lesion is 90% stenosed.  Ramus Intermedius Ramus lesion is 90% stenosed.  Intervention  No interventions have been documented.    ECHOCARDIOGRAM  ECHOCARDIOGRAM COMPLETE 09/23/2022  Narrative ECHOCARDIOGRAM REPORT    Patient Name:   ANNALYNN KABLE Date of Exam: 09/23/2022 Medical Rec #:  [id number]     Height:       59.0 in Accession #:    [id number]    Weight:       111.1 lb Date of Birth:  02-25-1931     BSA:          1.437 m Patient Age:    89 years      BP:           133/82 mmHg Patient Gender: F             HR:           74 bpm. Exam Location:  Inpatient  Procedure: 2D Echo, Cardiac Doppler and Color Doppler  Indications:     Stroke I63.9  History:         Patient has prior history of Echocardiogram examinations, most recent 05/05/2020. CAD, Cirrhosis, Aortic Valve Disease; Risk Factors:Dyslipidemia and Non-Smoker. Aortic Valve: 23 mm CoreValve-EvolutR prosthetic, stented (TAVR) valve is  present in the aortic position. Procedure Date: 01/26/19.  Sonographer:     Daune Eric Referring Phys:  Kenny Peals Diagnosing Phys: Knox Perl MD   Sonographer Comments: Suboptimal subcostal window. Image acquisition challenging due to respiratory motion. IMPRESSIONS   1. Diastology may not be accurate in presence of MV and MV annular calcification. Left ventricular ejection fraction, by estimation, is 60 to 65%. The left ventricle has normal function. The  left ventricle has no regional wall motion abnormalities. There is mild left ventricular hypertrophy. Left ventricular diastolic parameters are consistent with Grade II diastolic dysfunction (pseudonormalization). Elevated left ventricular end-diastolic pressure. 2. Right ventricular systolic function is normal. The right ventricular size is normal. There is normal pulmonary artery systolic pressure. The estimated right ventricular systolic pressure is 26.3 mmHg. 3. Left atrial size was moderately dilated. 4. The mitral valve is degenerative. Mild mitral valve regurgitation. No evidence of mitral stenosis. Moderate mitral annular calcification. 5. Normal TAVR function. The aortic valve is tricuspid. Aortic valve regurgitation is not visualized. Aortic valve sclerosis is present, with no evidence of aortic valve stenosis. There is a 23 mm CoreValve-EvolutR prosthetic (TAVR) valve present in the aortic position. Procedure Date: 01/26/19.  Comparison(s): Compared to the study 05/05/2020, Amild AV perivalvular leak not seen in the present study.  FINDINGS Left Ventricle: Diastology may not be accurate in presence of MV and MV annular calcification. Left ventricular ejection fraction, by estimation, is 60 to 65%. The left ventricle has normal function. The left ventricle has no regional wall motion abnormalities. The left ventricular internal cavity size was normal in size. There is mild left ventricular hypertrophy. Left ventricular diastolic  parameters are consistent with Grade II diastolic dysfunction (pseudonormalization). Elevated left ventricular end-diastolic pressure.  Right Ventricle: The right ventricular size is normal. No increase in right ventricular wall thickness. Right ventricular systolic function is normal. There is normal pulmonary artery systolic pressure. The tricuspid regurgitant velocity is 2.14 m/s, and with an assumed right atrial pressure of 8 mmHg, the estimated right ventricular systolic pressure is 26.3 mmHg.  Left Atrium: Left atrial size was moderately dilated.  Right Atrium: Right atrial size was normal in size.  Pericardium: There is no evidence of pericardial effusion.  Mitral Valve: The mitral valve is degenerative in appearance. There is mild thickening of the mitral valve leaflet(s). There is mild calcification of the mitral valve leaflet(s). Moderate mitral annular calcification. Mild mitral valve regurgitation. No evidence of mitral valve stenosis.  Tricuspid Valve: The tricuspid valve is normal in structure. Tricuspid valve regurgitation is mild . No evidence of tricuspid stenosis.  Aortic Valve: Normal TAVR function. The aortic valve is tricuspid. Aortic valve regurgitation is not visualized. Aortic valve sclerosis is present, with no evidence of aortic valve stenosis. Aortic valve mean gradient measures 9.0 mmHg. Aortic valve peak gradient measures 14.6 mmHg. Aortic valve area, by VTI measures 2.01 cm. There is a 23 mm CoreValve-EvolutR prosthetic, stented (TAVR) valve present in the aortic position. Procedure Date: 01/26/19.  Pulmonic Valve: The pulmonic valve was normal in structure. Pulmonic valve regurgitation is trivial. No evidence of pulmonic stenosis.  Aorta: The aortic root is normal in size and structure.  IAS/Shunts: No atrial level shunt detected by color flow Doppler.   LEFT VENTRICLE PLAX 2D LVIDd:         2.90 cm   Diastology LVIDs:         1.90 cm   LV e' medial:     6.20 cm/s LV PW:         0.90 cm   LV E/e' medial:  17.7 LV IVS:        0.70 cm   LV e' lateral:   4.24 cm/s LVOT diam:     2.20 cm   LV E/e' lateral: 25.9 LV SV:         78 LV SV Index:   54 LVOT Area:     3.80 cm  RIGHT VENTRICLE RV S prime:     12.40 cm/s TAPSE (M-mode): 2.0 cm  LEFT ATRIUM           Index        RIGHT ATRIUM           Index LA diam:      2.40 cm 1.67 cm/m   RA Area:     11.90 cm LA Vol (A2C): 18.3 ml 12.74 ml/m  RA Volume:   21.90 ml  15.24 ml/m LA Vol (A4C): 42.3 ml 29.44 ml/m AORTIC VALVE                     PULMONIC VALVE AV Area (Vmax):    1.90 cm      PR End Diast Vel: 4.39 msec AV Area (Vmean):   1.94 cm AV Area (VTI):     2.01 cm AV Vmax:           191.25 cm/s AV Vmean:          133.625 cm/s AV VTI:            0.388 m AV Peak Grad:      14.6 mmHg AV Mean Grad:      9.0 mmHg LVOT Vmax:         95.40 cm/s LVOT Vmean:        68.100 cm/s LVOT VTI:          0.205 m LVOT/AV VTI ratio: 0.53  AORTA Ao Root diam: 2.50 cm Ao Asc diam:  3.20 cm  MITRAL VALVE                TRICUSPID VALVE MV Area (PHT): 3.08 cm     TR Peak grad:   18.3 mmHg MV Decel Time: 246 msec     TR Vmax:        214.00 cm/s MR Peak grad: 5.5 mmHg MR Vmax:      117.00 cm/s   SHUNTS MV E velocity: 110.00 cm/s  Systemic VTI:  0.20 m MV A velocity: 140.00 cm/s  Systemic Diam: 2.20 cm MV E/A ratio:  0.79  Knox Perl MD Electronically signed by Knox Perl MD Signature Date/Time: 09/23/2022/12:31:44 PM    Final    CT SCANS  CT CORONARY MORPH W/CTA COR W/SCORE 03/05/2019  Addendum 03/05/2019  3:59 PM ADDENDUM REPORT: 03/05/2019 15:57  EXAM: OVER-READ INTERPRETATION  CT CHEST  The following report is an over-read performed by radiologist Dr. Melinda Blietzof [facility] on 03/05/2019. This over-read does not include interpretation of cardiac or coronary anatomy or pathology. The interpretation by the cardiologist is attached.  COMPARISON:  01/01/2019 and  09/12/2018.  FINDINGS: Atherosclerotic calcification of the aorta. No pathologically enlarged mediastinal, hilar or axillary lymph nodes. Esophagus is unremarkable. Large hiatal hernia also contains colon. 7 mm right middle lobe nodule (11/40), unchanged from 09/12/2018. Scarring or atelectasis in the lingula and left lower lobe. No pleural fluid. Old posterior right rib fracture.  IMPRESSION: 1. Right middle lobe nodule is stable from 09/12/2018. Additional follow-up CT chest without contrast in 12 months is recommended to ensure stability and exclude malignancy. This recommendation follows the consensus statement: Guidelines for Management of Small Pulmonary Nodules Detected on CT Images: From the Fleischner Society 2017; Radiology 2017; 284:228-243. 2.  Aortic atherosclerosis (ICD10-I70.0). 3. Large hiatal hernia.   Electronically Signed By: Shearon Denis M.D. On: 03/05/2019 15:57  Narrative CLINICAL DATA:  Post TAVR Peri Valvular Regurgitation  EXAM: Cardiac  TAVR CT  TECHNIQUE: The patient was scanned on a Siemens Force 192 slice scanner. A 120 kV retrospective scan was triggered in the ascending thoracic aorta at 140 HU's. Gantry rotation speed was 250 msecs and collimation was .6 mm. No beta blockade or nitro were given. The 3D data set was reconstructed in 5% intervals of the R-R cycle. Systolic and diastolic phases were analyzed on a dedicated work station using MPR, MIP and VRT modes. The patient received 80 cc of contrast.  FINDINGS: The patient is s/p TAVR with a 23 mm Medtronic Evolut Pro valve. The valve appears well positioned. Native aortic root dimension 3.0 cm with no disruption. The leaflets appear normal and co-apt well There is no HALT/HAM. Measurements as below  SA cusp dimension  Non - 17.7 mm  Left- 19.6 mm  Right - 17 mm  Coronal Measurements  Inflow Diameter 20.7 mm Target 23 mm  Waist Diameter 18.8 mm Target 20 mm  Outflow  Diameter 24 mm Target 34 mm  The mechanism for worsening PVL seen on echo appears to be interposition of calcium  from the compressed non coronary cusp behind the stent valve. On SA images calcium  measures 4.9 mm x 5.7 mm and corresponds to the pre implant calcium  seen on the non coronary native aortic valve leaflet This calcium  is seen best around 7:00 on the SA images. There is also a smaller area of calcium  behind the left cusp at 4:00 on SA images slightly more superior that may correspond to the 2nd PVL jet seen on TTE  There appears to be normal LM and RCA flow with no obstruction from the stent valve  IMPRESSION: 1. Well positioned 23 mm Evolut Pro valve with normal appearing leaflets. Good coaptation with no HALT/HAM. Although target diameters for inflow, waist, outflow in systole are slightly low the mechanism of worsening PVL appears to be primarily a large area of calcification posterior to the stent valve from the native non coronary cusp. This area of calcium  measures 4.9 x 5.7 mm and is best seen on SA images at 7:00.  Janelle Mediate  Electronically Signed: By: Janelle Mediate M.D. On: 03/05/2019 13:41   CT SCANS  CT CORONARY MORPH W/CTA COR W/SCORE 01/01/2019  Addendum 01/01/2019  8:10 PM ADDENDUM REPORT: 01/01/2019 20:08  CLINICAL DATA:  Severe Aortic Stenosis.  EXAM: Cardiac TAVR CT  TECHNIQUE: The patient was scanned on a [employer]. A 120 kV retrospective scan was triggered in the descending thoracic aorta at 111 HU's. Gantry rotation speed was 250 msecs and collimation was .6 mm. No beta blockade or nitro were given. The 3D data set was reconstructed in 10% intervals of the R-R cycle. Systolic and diastolic phases were analyzed on a dedicated work station using MPR, MIP and VRT modes. The patient received 80 cc of contrast.  FINDINGS: Image quality: Excellent.  Noise artifact is: Limited.  Valve Morphology: Severely  calcified/thickened aortic valve. Tricuspid with functional fusion of the RCC/NCC. Leaflet motion of the RCC/NCC is severely restricted.  Aortic Valve area: 0.63 cm2  Aortic Valve Calcium  score: 1333 AU  Aortic annular dimension:  Phase assessed: 20%  Annular area: 3.03 cm2  Annular perimeter: 62.3 mm  Max diameter: 20.4 mm  Min diameter: 19.6 mm  Annular/Subannular calcification: No annular calcification and no subannular calcification.  Optimal coplanar projection: LAO 25 CAU 5  Coronary Artery Height above Annulus:  Left Main: 14.7 mm  Right Coronary: 15.4 mm  Sinus of Valsalva Measurements:  Non-coronary: 24.5 mm  Right-coronary: 24.2 mm  Left-coronary: 24.3 mm  Sinotubular Junction: 25.6 mm with mild calcifications.  Sinotubular Junction Height: 16.3 mm  Ascending Thoracic Aorta: 29 mm without significant calcifications.  Coronary arteries: Study performed without NTG, and complete assessment not made. Would refer to recent cardiac cath for coronary assessment.  Coronary calcium  score: 594 (78th percentile for age- and sex-matched controls).  Left main: The left main is a large caliber vessel with a normal take off from the left coronary cusp that trifurcates into a LAD, LCX, and ramus intermedius. There is no plaque or stenosis.  Left anterior descending artery: The proximal LAD contains heavily calcified plaque of indeterminate severity.  Ramus intermedius: Moderate to severe proximal stenosis.  Left circumflex artery: The LCX is dominant without evidence of proximal stenosis.  Right coronary artery: The RCA is non-dominant.  Cardiac Morphology:  Right Atrium: Right atrial size is within normal limits.  Right Ventricle: The right ventricular cavity is within normal limits.  Left Atrium: Left atrial size is normal in size with no left atrial appendage filling defect.  Left Ventricle: The ventricular cavity size is within normal  limits. Sigmoid septum noted. There are no stigmata of prior infarction. There is no abnormal filling defect. Hyperdynamic LV function, EF estimated to be 85%. No regional wall motion abnormalities.  Pulmonary arteries: Normal in size without proximal filling defect.  Pulmonary veins: Normal pulmonary venous drainage. There were 3 noted pulmonary veins, 2 on the right and 1 on the left.  Pericardium: Normal thickness with no significant effusion or calcium  present.  Mitral Valve: The mitral valve is thickened with moderate annular calcification.  Extra-cardiac findings: Very large hiatal hernia. Tortuous descending aorta. See attached radiology report for non-cardiac structures.  IMPRESSION: 1. Severe aortic stenosis. 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  2. Annular area 3.03 cm2 (perimeter 62.3 mm). No significant annular/subannular calcification noted.  3. Sufficient coronary to annulus distance.  4. Optimum Fluoroscopic Angle for Delivery: LAO 25 CAU 5  Jackquelyn Mass, MD   Electronically Signed By: Jackquelyn Mass On: 01/01/2019 20:08  Narrative EXAM: OVER-READ INTERPRETATION  CT CHEST  The following report is an over-read performed by radiologist Dr. Alexandria Angel of [facility] on 01/01/2019. This over-read does not include interpretation of cardiac or coronary anatomy or pathology. The coronary calcium  score/coronary CTA interpretation by the cardiologist is attached.  COMPARISON:  Chest CTA 09/12/2018.  FINDINGS: Extracardiac findings will be described separately under dictation for contemporaneously obtained CTA chest, abdomen and pelvis.  IMPRESSION: Please see separate dictation for contemporaneously obtained CTA chest, abdomen and pelvis dated 01/01/2019.  Electronically Signed: By: Alexandria Angel M.D. On: 01/01/2019 11:40          EKG:        Recent Labs: 02/23/2023: ALT 33 02/24/2023: TSH  5.712 02/26/2023: B Natriuretic Peptide 300.2; Hemoglobin 10.3; Magnesium  2.6; Platelets 224 02/28/2023: BUN 14; Creatinine, Ser 1.36; Potassium 3.9; Sodium 130  Recent Lipid Panel    Component Value Date/Time   CHOL 175 09/23/2022 0224   CHOL 163 11/30/2019 1007   TRIG 65 09/23/2022 0224   HDL 63 09/23/2022 0224   HDL 62 11/30/2019 1007   CHOLHDL 2.8 09/23/2022 0224   VLDL 13 09/23/2022 0224   LDLCALC 99 09/23/2022 0224   LDLCALC 85 11/30/2019 1007     Risk Assessment/Calculations:                Physical Exam:  VS:  BP 110/70 Comment: Left arm 110/64  Pulse 79   Ht 5' (1.524 m)   Wt 123 lb 3.2 oz (55.9 kg)   SpO2 99%   BMI 24.06 kg/m     Wt Readings from Last 3 Encounters:  03/03/23 123 lb 3.2 oz (55.9 kg)  02/17/23 112 lb (50.8 kg)  02/13/23 112 lb (50.8 kg)     GEN: Elderly woman in no acute distress HEENT: Normal NECK: JVP is moderately elevated; No carotid bruits LYMPHATICS: No lymphadenopathy CARDIAC: Heart is regular rate and rhythm with a 2/6 systolic murmur at the right upper sternal border RESPIRATORY: Diminished in the bases, otherwise clear ABDOMEN: Soft, non-tender, mildly distended MUSCULOSKELETAL: 2+ bilateral pretibial edema; No deformity  SKIN: Warm and dry with a rash on her upper legs especially along the inner part of her thighs NEUROLOGIC:  Alert and oriented x 3 PSYCHIATRIC:  Normal affect   Assessment & Plan Medication management Discussed the fact that the patient's blood pressures have been running low with frequent systolic readings less than 100 mmHg.  With the need to diurese, recommended that they hold propranolol . Acute on chronic heart failure with preserved ejection fraction (HCC) The patient has significant volume overload on exam.  Her weight is up 11 pounds from baseline.  I recommended that she take an additional furosemide  40 mg orally today, then stay on the 40 mg daily dose over the next 2 days.  I would then like her to  take furosemide  20 mg daily.  Her baseline furosemide  dose before she was hospitalized is a dose of 20 mg on Monday, Wednesday, and Friday.  While she is taking daily furosemide , she will increase her potassium chloride  to 10 mill equivalents twice daily.  Will check a metabolic panel later this week.  I have asked her to reach out through the patient portal in a few days to make sure that her weight is trending down.  She has a scale at home and weighs every day.  If she is not progressing with current measures, we will either increase her oral diuretic further or consider hospital admission for IV diuresis.  I am hopeful that this can be managed in the outpatient setting.  Will arrange close outpatient follow-up with Dr. Berry Bristol or his APP in about 4 weeks.       Medication Adjustments/Labs and Tests Ordered: Current medicines are reviewed at length with the patient today.  Concerns regarding medicines are outlined above.  Orders Placed This Encounter  Procedures   Basic metabolic panel   No orders of the defined types were placed in this encounter.   Patient Instructions  Medication Instructions:  Take 40 mg Lasix  TODAY 2/10, TOMORROW 2/11 and [facility] 2/12, then take 20 mg once daily Increase Potassium Chlordie to 10 meq twice daily *If you need a refill on your cardiac medications before your next appointment, please call your pharmacy*   Lab Work: [facility] Thursday 2/13 If you have labs (blood work) drawn today and your tests are completely normal, you will receive your results only by: MyChart Message (if you have MyChart) OR A paper copy in the mail If you have any lab test that is abnormal or we need to change your treatment, we will call you to review the results.   Follow-Up: At [facility], you and your health needs are our priority.  As part of our continuing mission to provide you with exceptional heart care, we have created designated Provider  Care Teams.  These Care  Teams include your primary Cardiologist (physician) and Advanced Practice Providers (APPs -  Physician Assistants and Nurse Practitioners) who all work together to provide you with the care you need, when you need it.  We recommend signing up for the patient portal called "MyChart".  Sign up information is provided on this After Visit Summary.  MyChart is used to connect with patients for Virtual Visits (Telemedicine).  Patients are able to view lab/test results, encounter notes, upcoming appointments, etc.  Non-urgent messages can be sent to your provider as well.   To learn more about what you can do with MyChart, go to [email or web address].    Your next appointment:   4 week(s)  Provider:   APP or Ganji in 4 weeks  Other Instructions Please send MyChart message Thursday 2/13 AM with update in daily weights    1st Floor: - Lobby - Registration  - Pharmacy  - Lab - Cafe  2nd Floor: - PV Lab - Diagnostic Testing (echo, CT, nuclear med)  3rd Floor: - Vacant  4th Floor: - TCTS (cardiothoracic surgery) - AFib Clinic - Structural Heart Clinic - Vascular Surgery  - Vascular Ultrasound  5th Floor: - HeartCare Cardiology (general and EP) - Clinical Pharmacy for coumadin, hypertension, lipid, weight-loss medications, and med management appointments    Valet parking services will be available as well.        Signed, Arnoldo Lapping, MD  03/03/2023 5:27 PM    [facility]

## 2023-03-04 ENCOUNTER — Other Ambulatory Visit: Payer: Self-pay

## 2023-03-04 ENCOUNTER — Telehealth: Payer: Self-pay | Admitting: *Deleted

## 2023-03-04 NOTE — Telephone Encounter (Signed)
Reviewed with Dr Jacinto Halim and parameters as to when to take prn propranolol would be heart rate greater than 90 and BP greater than 100 I spoke with patient's daughter and gave her message from Dr Jacinto Halim and parameters

## 2023-03-04 NOTE — Telephone Encounter (Signed)
I called patient to discuss follow up with Dr Jacinto Halim.  I spoke with patient's daughter and she would like to keep appointment on 2/14 Daughter reports patient was previously on propranolol 20 mg twice daily.  They had to hold at times due to low BP. Yesterday when patient saw Dr Excell Seltzer they were instructed to hold propranolol due to low BP.  BP on Sunday night was 97/57.  Daughter reports at that time patient had not taken propranolol for several days due to low BP.  Patient is continuing to hold propranolol. Daughter reports patient's heart rate is usually 70-80 but it has been higher since last evening.  Last night readings were 117/75, 94.  This AM readings were 116/69, 102 at rest.  Daughter is concerned about higher heart rate and would like exact parameters for when to give propranolol based on heart rate and BP

## 2023-03-04 NOTE — Telephone Encounter (Signed)
[facility] for pt's medication potassium. Pt stated that her medication was increased yesterday at her office visit. This was not done. Please address

## 2023-03-04 NOTE — Telephone Encounter (Signed)
Elevated heart rate could be related to heart failure with excess volume.  This would improve over time as the fluid status stabilizes.  However if patient is bothered by elevated heart rate, she could certainly take 1/2 tablet of propranolol 20 mg on a as needed basis that she already has with her.

## 2023-03-06 ENCOUNTER — Telehealth: Payer: Self-pay | Admitting: Cardiology

## 2023-03-06 NOTE — Telephone Encounter (Signed)
Kia with [facility] called to see if Dr Jacinto Halim can put in referral for pt for weight loss. Can be placed thru Epic or fax # is [phone] thx. Pt has appt tomorrow with Ottis Stain please discuss

## 2023-03-06 NOTE — Telephone Encounter (Signed)
Daughter wanted to call with update since visit with Dr. Excell Seltzer.  Spoke with daughter Amy per DPR she states patient weight has gone down since last visit. She states she was 124 and after  60 mg of lasix she was 120 then she was down to 117 then was up 3 lbs with fluid. She is now at 120. Patient started propanolol yesterday but only had one dose because her heart rate was low. She also need to use her albuterol last night.

## 2023-03-06 NOTE — Telephone Encounter (Signed)
Pt's daughter would like a c/b when referral for Nutritionist has been put in. Daughter states this will will determine whether pt is able to keep scheduled appt for tomorrow. Please advise

## 2023-03-07 ENCOUNTER — Encounter: Payer: Self-pay | Admitting: Cardiology

## 2023-03-07 ENCOUNTER — Ambulatory Visit: Payer: Federal, State, Local not specified - PPO | Attending: Cardiology | Admitting: Cardiology

## 2023-03-07 VITALS — BP 104/58 | HR 88 | Resp 16 | Ht 60.0 in | Wt 117.4 lb

## 2023-03-07 DIAGNOSIS — I2510 Atherosclerotic heart disease of native coronary artery without angina pectoris: Secondary | ICD-10-CM | POA: Diagnosis not present

## 2023-03-07 DIAGNOSIS — I5033 Acute on chronic diastolic (congestive) heart failure: Secondary | ICD-10-CM

## 2023-03-07 DIAGNOSIS — Z952 Presence of prosthetic heart valve: Secondary | ICD-10-CM | POA: Diagnosis not present

## 2023-03-07 LAB — BASIC METABOLIC PANEL
BUN/Creatinine Ratio: 15 (ref 12–28)
BUN: 20 mg/dL (ref 8–27)
CO2: 26 mmol/L (ref 20–29)
Calcium: 8.4 mg/dL — ABNORMAL LOW (ref 8.7–10.3)
Chloride: 95 mmol/L — ABNORMAL LOW (ref 96–106)
Creatinine, Ser: 1.3 mg/dL — ABNORMAL HIGH (ref 0.57–1.00)
Glucose: 118 mg/dL — ABNORMAL HIGH (ref 70–99)
Potassium: 3.9 mmol/L (ref 3.5–5.2)
Sodium: 135 mmol/L (ref 134–144)
eGFR: 39 mL/min/{1.73_m2} — ABNORMAL LOW (ref >59)

## 2023-03-07 MED ORDER — PROPRANOLOL HCL 10 MG PO TABS: 10.0000 mg | ORAL_TABLET | Freq: Two times a day (BID) | ORAL | 1 refills | Status: DC

## 2023-03-07 NOTE — Progress Notes (Signed)
Cardiology Office Note:  .   Date:  03/09/2023  ID:  [name], DOB [birth year], MRN [id number] PCP: Ollen Bowl, MD  [facility] Providers Cardiologist:  Yates Decamp, MD Structural Heart:  Verne Carrow, MD  History of Present Illness: .   LETECIA ARPS is a 89 y.o. 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 Underwent elective TAVR on 01/26/2019.    The patient was recently hospitalized from February 2 through February 28, 2023.  She was noted to have hyponatremia and she was treated with IV fluids felt to be hypovolemic hyponatremia in the setting of diarrhea.  She also has a history of liver cirrhosis and portal hypertension.    She has had multipile telephone conversations about weight gain, CHF and dyspnea. She was evaluated by Dr. Excell Seltzer on 03/03/23 and started on diuretics.   Discussed the use of AI scribe software for clinical note transcription with the patient, who gave verbal consent to proceed.  History of Present Illness   The patient, an elderly woman with a history of heart failure, was recently hospitalized due to severe dehydration caused by diarrhea. Post-hospitalization, she developed heart failure due to excessive fluid intake. The patient's daughter reports that the patient's weight has been fluctuating, with a net loss of approximately 7-8 pounds since her last doctor's visit. The patient's heart rate has been elevated, reaching up to 110 at rest. The patient's current medications include Lasix, potassium, and propranolol. The patient's diet has been adjusted to limit sodium and fluid intake. The patient's daughter is concerned about the patient's protein intake and the potential impact  on her kidney function.     Labs   Lab Results  Component Value Date   CHOL 175 09/23/2022   HDL 63 09/23/2022   LDLCALC 99 09/23/2022   TRIG 65 09/23/2022   CHOLHDL 2.8 09/23/2022   Lab Results  Component Value Date   NA 135 03/06/2023   K 3.9 03/06/2023   CO2 26 03/06/2023   GLUCOSE 118 (H) 03/06/2023   BUN 20 03/06/2023   CREATININE 1.30 (H) 03/06/2023   CALCIUM 8.4 (L) 03/06/2023   EGFR 39 (L) 03/06/2023   GFRNONAA 37 (L) 02/28/2023      Latest Ref Rng & Units 03/06/2023   11:38 AM 02/28/2023    5:43 AM 02/27/2023    4:32 AM  BMP  Glucose 70 - 99 mg/dL 409  811  914   BUN 8 - 27 mg/dL 20  14  13    Creatinine 0.57 - 1.00 mg/dL 7.82  9.56  2.13   BUN/Creat Ratio 12 - 28 15     Sodium 134 - 144 mmol/L 135  130  129   Potassium 3.5 - 5.2 mmol/L 3.9  3.9  3.6   Chloride 96 - 106 mmol/L 95  100  99   CO2 20 - 29 mmol/L 26  23  22    Calcium 8.7 - 10.3 mg/dL 8.4  8.3  8.2       Latest Ref Rng & Units 02/26/2023    4:45 AM 02/25/2023    4:23 AM 02/23/2023    1:57 PM  CBC  WBC 4.0 - 10.5 K/uL 8.0  7.7  13.8   Hemoglobin 12.0 - 15.0  g/dL 16.1  9.9  09.6   Hematocrit 36.0 - 46.0 % 30.5  29.2  33.8   Platelets 150 - 400 K/uL 224  224  299    Lab Results  Component Value Date   HGBA1C 5.9 (H) 11/28/2022    Lab Results  Component Value Date   TSH 5.712 (H) 02/24/2023     Review of Systems  Cardiovascular:  Positive for dyspnea on exertion and leg swelling (improved). Negative for chest pain and orthopnea.   Physical Exam:   VS:  BP (!) 104/58 (BP Location: Left Arm, Patient Position: Sitting, Cuff Size: Normal)   Pulse 88   Resp 16   Ht 5' (1.524 m)   Wt 117 lb 6.4 oz (53.3 kg)   SpO2 96%   BMI 22.93 kg/m    Wt Readings from Last 3 Encounters:  03/07/23 117 lb 6.4 oz (53.3 kg)  03/03/23 123 lb 3.2 oz (55.9 kg)  02/17/23 112 lb (50.8 kg)     Physical Exam Neck:     Vascular: No JVD.  Cardiovascular:     Rate and Rhythm: Normal rate and regular rhythm.      Pulses: Intact distal pulses.     Heart sounds: S1 normal and S2 normal. Murmur heard.     Early systolic murmur is present with a grade of 2/6 at the upper right sternal border.     No gallop.  Pulmonary:     Effort: Pulmonary effort is normal.     Breath sounds: Examination of the left-lower field reveals rales. Rales present.  Abdominal:     General: Bowel sounds are normal.     Palpations: Abdomen is soft.  Musculoskeletal:     Right lower leg: Edema (1-2 plus bilateral leg edema) present.     Left lower leg: Edema (1-2 plus bilateral leg edema) present.    Studies Reviewed: Marland Kitchen    ECHOCARDIOGRAM COMPLETE 09/23/2022  1. Diastology may not be accurate in presence of MV and MV annular calcification. Left ventricular ejection fraction, by estimation, is 60 to 65%. The left ventricle has normal function. The left ventricle has no regional wall motion abnormalities. There is mild left ventricular hypertrophy. Left ventricular diastolic parameters are consistent with Grade II diastolic dysfunction (pseudonormalization). Elevated left ventricular end-diastolic pressure. 2. Right ventricular systolic function is normal. The right ventricular size is normal. There is normal pulmonary artery systolic pressure. The estimated right ventricular systolic pressure is 26.3 mmHg. 3. Left atrial size was moderately dilated. 4. The mitral valve is degenerative. Mild mitral valve regurgitation. No evidence of mitral stenosis. Moderate mitral annular calcification. 5. Normal TAVR function. The aortic valve is tricuspid. Aortic valve regurgitation is not visualized. Aortic valve sclerosis is present, with no evidence of aortic valve stenosis. There is a 23 mm CoreValve-EvolutR prosthetic (TAVR) valve present in the aortic position. Procedure Date: 01/26/19.   Comparison(s): Compared to the study 05/05/2020, mild AV perivalvular leak not seen in the present study.   EKG:    EKG 02/23/2023: NSR,  LBBB  Medications and allergies    Allergies  Allergen Reactions   Aspirin Anaphylaxis   Bee Venom Anaphylaxis   Ceclor [Cefaclor] Anaphylaxis   Simvastatin Other (See Comments)    Hair loss   Pneumococcal Vac Polyvalent Swelling    Other reaction(s): swelling and erythema   Statins Other (See Comments)    Elevated liver enzymes   Crestor [Rosuvastatin] Rash   Sulfa Antibiotics Rash     Current  Outpatient Medications:    albuterol (VENTOLIN HFA) 108 (90 Base) MCG/ACT inhaler, Inhale 2 puffs into the lungs every 6 (six) hours as needed for shortness of breath (use as needed or before activity for shortness of breath or whezzing)., Disp: 8.5 each, Rfl: 6   Artificial Saliva (BIOTENE DRY MOUTH) LOZG, Use as directed 1 Piece in the mouth or throat as needed (dry mouth)., Disp: , Rfl:    clopidogrel (PLAVIX) 75 MG tablet, TAKE 1 TABLET BY MOUTH EVERY DAY, Disp: 90 tablet, Rfl: 1   cyclobenzaprine (FLEXERIL) 5 MG tablet, Take 1 tablet (5 mg total) by mouth 3 (three) times daily as needed for up to 10 days for muscle spasms., Disp: 30 tablet, Rfl: 0   diphenhydrAMINE (BENADRYL) 25 mg capsule, Take 25 mg by mouth every 6 (six) hours as needed for allergies., Disp: , Rfl:    EPINEPHrine 0.3 mg/0.3 mL IJ SOAJ injection, Inject 0.3 mg into the muscle as needed for anaphylaxis (for bee stings - Patient has one on hand)., Disp: , Rfl:    fexofenadine (ALLEGRA) 180 MG tablet, Take 180 mg by mouth daily as needed for allergies or rhinitis., Disp: , Rfl:    fluticasone (FLONASE) 50 MCG/ACT nasal spray, Place 2 sprays into both nostrils at bedtime., Disp: , Rfl:    furosemide (LASIX) 20 MG tablet, Take 1 tablet (20 mg total) by mouth as needed. (Patient taking differently: Take 20 mg by mouth See admin instructions. 60mg  Mon - Tues 40mg  - Wed 40mg  - Thurs 20mg  - Fri 20mg ), Disp: 90 tablet, Rfl: 3   levothyroxine (SYNTHROID) 50 MCG tablet, Take 50 mcg by mouth daily before breakfast. In am, Disp: , Rfl:     montelukast (SINGULAIR) 10 MG tablet, Take 1 tablet (10 mg total) by mouth at bedtime., Disp: 90 tablet, Rfl: 3   Multiple Vitamin (MULTIVITAMIN WITH MINERALS) TABS tablet, Take 1 tablet by mouth every other day., Disp: , Rfl:    Peak Flow Meter DEVI, 1 puff by Does not apply route in the morning and at bedtime., Disp: 1 each, Rfl: 0   Polyethyl Glycol-Propyl Glycol (SYSTANE) 0.4-0.3 % SOLN, Place 1 drop into both eyes daily as needed (dry/irritated eyes.)., Disp: , Rfl:    sodium chloride (OCEAN) 0.65 % nasal spray, Place 1 spray into the nose every other day., Disp: , Rfl:    Spacer/Aero-Holding Chambers (AEROCHAMBER PLS FLOVU MTHPIECE) DEVI, , Disp: , Rfl:    Tiotropium Bromide-Olodaterol (STIOLTO RESPIMAT) 2.5-2.5 MCG/ACT AERS, Inhale 2 puffs into the lungs daily., Disp: 12 g, Rfl: 1   triamcinolone cream (KENALOG) 0.1 %, Apply 1 Application topically 2 (two) times daily., Disp: , Rfl:    oxyCODONE (OXY IR/ROXICODONE) 5 MG immediate release tablet, Take 1 tablet (5 mg total) by mouth every 6 (six) hours as needed for moderate pain (pain score 4-6). (Patient not taking: Reported on 03/07/2023), Disp: 15 tablet, Rfl: 0   potassium chloride (KLOR-CON) 10 MEQ tablet, Take 1 tablet (10 mEq total) by mouth daily., Disp: 90 tablet, Rfl: 3   propranolol (INDERAL) 10 MG tablet, Take 1 tablet (10 mg total) by mouth 2 (two) times daily., Disp: 180 tablet, Rfl: 1   ASSESSMENT AND PLAN: .      ICD-10-CM   1. Acute on chronic heart failure with preserved ejection fraction (HCC)  I50.33 propranolol (INDERAL) 10 MG tablet    2. Coronary artery disease involving native coronary artery of native heart without angina pectoris  I25.10 propranolol (INDERAL)  10 MG tablet    3. 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 Current weight is  117.4 lbs, down from 124 lbs. The goal is gradual weight reduction to prevent dehydration and kidney function decline, with a focus on low sodium intake to avoid recurrence. Risks of rapid weight loss, including dehydration and kidney function decline, were discussed. The patient and family understand the need for gradual weight reduction and dietary restrictions. Continue 20 mg Lasix once daily through the weekend. Increase to 20 mg twice daily if weight does not decrease by the end of the weekend. Monitor weight daily and adjust Lasix dosage to prevent rapid weight loss. Restrict sodium intake to 1600-1800 mg per day and encourage fresh or frozen foods with no added salt.  Resume propranolol at 10 mg twice daily. Monitor heart rate and ensure it remains above 60 bpm before administering propranolol. Send new prescription for [facility]pr[facility]ol 10 mg tablets to [facility].  Chronic Kidney Disease Chronic kidney disease is present with a recent creatinine level of 1.3. Protein intake was previously restricted due to kidney function concerns. Current management focuses on balancing protein intake with kidney function. Risks of high protein intake and benefits of moderate protein intake were discussed. The patient and family understand the need for balanced protein intake and monitoring kidney function. Allow moderate protein intake, including high-protein Ensure if tolerated. Monitor kidney function and creatinine levels monthly. Adjust potassium supplementation based on Lasix dosage: twice daily if on Lasix twice daily, once daily if on Lasix once daily.  General Health Maintenance Overall health is improving with current management. Emphasis is on gradual recovery and maintaining appropriate dietary and fluid restrictions. Physical activity and use of compression socks for leg swelling are encouraged. Encourage physical activity, such as walking to the mailbox and back. Limit fluid intake to six cups per  day. Consider over-the-counter compression socks or knee-highs for leg swelling.  Follow-up Follow up in one month to reassess kidney function and overall health. Contact the doctor if any issues arise before the scheduled follow-up.      Patient is accompanied by 2 daughters who had several questions written down all questions answered in detail to their satisfaction.  She has not had any angina pectoris, heart failure symptoms are gradually improved, she still has mild left basilar crackles and she is still over her baseline weight but will need gradual diuresis.  Discussed the effects of diuretics and renal insufficiency and patient is presently 89 years of age, CODE STATUS need to be discussed as well, although she is full code right now she does have ACP documents.  With regard to aortic valve replacement, reviewed her echocardiogram, no change in physical exam, I do not think she needs repeat echocardiogram, last echocardiogram was in September 2024.  I will see him back in 4 to 6 weeks for follow-up.  Total time spent with patient and family answering all questions, review of prior records was 38 minutes.     Signed,  Yates Decamp, MD, [facility] 03/09/2023, 7:05 PM [facility] [address] [place], [place] [address] Phone: [phone]. Fax:  [phone]

## 2023-03-07 NOTE — Patient Instructions (Addendum)
Medication Instructions:  Your physician has recommended you make the following change in your medication: Change propranolol to 10 mg by mouth twice daily   *If you need a refill on your cardiac medications before your next appointment, please call your pharmacy*   Lab Work: none If you have labs (blood work) drawn today and your tests are completely normal, you will receive your results only by: MyChart Message (if you have MyChart) OR A paper copy in the mail If you have any lab test that is abnormal or we need to change your treatment, we will call you to review the results.   Testing/Procedures: none   Follow-Up: At [facility], you and your health needs are our priority.  As part of our continuing mission to provide you with exceptional heart care, we have created designated Provider Care Teams.  These Care Teams include your primary Cardiologist (physician) and Advanced Practice Providers (APPs -  Physician Assistants and Nurse Practitioners) who all work together to provide you with the care you need, when you need it.  We recommend signing up for the patient portal called "MyChart".  Sign up information is provided on this After Visit Summary.  MyChart is used to connect with patients for Virtual Visits (Telemedicine).  Patients are able to view lab/test results, encounter notes, upcoming appointments, etc.  Non-urgent messages can be sent to your provider as well.   To learn more about what you can do with MyChart, go to [email or web address].    Your next appointment:   3/11 at 11:40  Provider:   Yates Decamp, MD     Other Instructions

## 2023-03-07 NOTE — Telephone Encounter (Signed)
Patient saw Dr Jacinto Halim today

## 2023-03-08 ENCOUNTER — Telehealth: Payer: Self-pay | Admitting: Physician Assistant

## 2023-03-08 ENCOUNTER — Other Ambulatory Visit: Payer: Self-pay | Admitting: Physician Assistant

## 2023-03-08 MED ORDER — POTASSIUM CHLORIDE ER 10 MEQ PO TBCR: 10.0000 meq | EXTENDED_RELEASE_TABLET | Freq: Every day | ORAL | 3 refills | Status: DC

## 2023-03-08 NOTE — Telephone Encounter (Signed)
Patient's daughter called because she is concerned about her medications.  The patient was volume overloaded when seen by Dr. Excell Seltzer and took extra Lasix as well as extra potassium for a few days.  She then saw Dr. Jacinto Halim yesterday and he renewed the Lasix prescription with instructions but did not renew the potassium.  She is almost out of the potassium.  An additional question for Dr. Jacinto Halim is should the patient continue taking her atorvastatin.  It is not on her current med list.  I renewed the potassium prescription [facility] [address] [facility].  I will route this note to Dr. Jacinto Halim and request that he address whether or not she should take the atorvastatin.  Theodore Demark, PA-C 03/08/2023 2:16 PM

## 2023-03-09 ENCOUNTER — Encounter: Payer: Self-pay | Admitting: Cardiology

## 2023-03-09 MED ORDER — POTASSIUM CHLORIDE ER 10 MEQ PO TBCR: 10.0000 meq | EXTENDED_RELEASE_TABLET | Freq: Every day | ORAL | 3 refills | Status: DC

## 2023-03-15 ENCOUNTER — Other Ambulatory Visit (HOSPITAL_BASED_OUTPATIENT_CLINIC_OR_DEPARTMENT_OTHER): Payer: Self-pay | Admitting: Pulmonary Disease

## 2023-03-17 ENCOUNTER — Telehealth: Payer: Self-pay | Admitting: Cardiology

## 2023-03-17 MED ORDER — ATORVASTATIN CALCIUM 10 MG PO TABS: 5.0000 mg | ORAL_TABLET | Freq: Every day | ORAL | Status: DC

## 2023-03-17 NOTE — Telephone Encounter (Signed)
 Resume propranolol at 10 mg twice daily. Monitor heart rate and ensure it remains above 60 bpm before administering propranolol.  Goal BP 130/80 mm Hg. Hold BP meds if BP < 100/60 mm Hg.   Okay to restart atorvastatin in spite of cirrhosis, she was only on 10 mg tablets of atorvastatin 1/2 tablet daily. Yes okay to take Lipitor with cirrhosis

## 2023-03-17 NOTE — Telephone Encounter (Signed)
 I spoke with patient's daughter and gave her message from Dr Jacinto Halim

## 2023-03-17 NOTE — Telephone Encounter (Signed)
 Pt c/o medication issue:  1. Name of Medication:  Lasix Potassium   2. How are you currently taking this medication (dosage and times per day)?   3. Are you having a reaction (difficulty breathing--STAT)?   4. What is your medication issue?   Daughter is following up to report weights:  2/24: 113.2  2/23: 113.4  2/22: 112  2/21: 113  2/20: 113.8  2/19: 114  2/17: 116   Weight is back down to normal range. Daughter would like to know which regimen pt needs to follow going forward. Continue on Lasix and Potassium? She would also like to establish BP/HR parameters for taking Propanolol and if it's alright for pt to take Lipitor with cirrhosis of liver. Please advise.

## 2023-03-19 ENCOUNTER — Telehealth: Payer: Self-pay | Admitting: Pulmonary Disease

## 2023-03-19 NOTE — Telephone Encounter (Signed)
 Noted.

## 2023-03-19 NOTE — Telephone Encounter (Signed)
 Long term care insurance may be reaching out to Dr.Ellison about this patient's medical conditions.

## 2023-03-24 ENCOUNTER — Telehealth: Payer: Self-pay | Admitting: Cardiology

## 2023-03-24 NOTE — Telephone Encounter (Signed)
 I spoke with patient's daughter who reports patient's current weight is 109 lbs.  Yesterday was 110 lb and on Thursday was 112 lbs.  Patient is currently taking lasix 20 mg daily and potassium 10 meq daily.  Daughter is asking how low Dr Jacinto Halim would like patient's weight to go and if any changes need to be made to lasix/potassium.   Daughter reports one day last week patient forgot to take AM lasix and had shortness of breath during the evening so took lasix at that time.  Weight that day was 115 lbs.  Reports BP is good--127/69, 132/89.  Heart rate 84

## 2023-03-24 NOTE — Telephone Encounter (Signed)
 Pt c/o medication issue:  1. Name of Medication: Furosemide  2. How are you currently taking this medication (dosage and times per day)?   3. Are you having a reaction (difficulty breathing--STAT)?   4. What is your medication issue? Patient weight is now 68- she says patient's weight have never been this low- how should patient take the Furosemide at this time?

## 2023-03-27 ENCOUNTER — Telehealth: Payer: Self-pay | Admitting: Pulmonary Disease

## 2023-03-27 ENCOUNTER — Encounter (HOSPITAL_BASED_OUTPATIENT_CLINIC_OR_DEPARTMENT_OTHER): Payer: Self-pay

## 2023-03-27 NOTE — Telephone Encounter (Signed)
 Patient needs a doctors note that states she needs long term care from a CNA. It needs to state that she has lung issues(COPD,emphysema,asthma, recent hospitalizations etc) that limits her ability to care for herself. The letter can be faxed to Attention: [employer]ear[employer] Les[employer] Policy Number [id number] fax: [phone]. For more information you can reach her daughter/medical power of attorney at [phone].

## 2023-03-27 NOTE — Telephone Encounter (Signed)
 OK to write that letter with phrase including:  "Due to her COPD with emphysema, asthma and multiple hospitalizations in the last 12 months resulting in severe deconditioning, she would be an appropriate candidate for long term care from a CNA to assist in her activities of daily living"

## 2023-03-27 NOTE — Telephone Encounter (Signed)
 Advise if letter is okay to write

## 2023-03-27 NOTE — Telephone Encounter (Signed)
 Please tell them they should not worry about weight on a daily basis. Let us hold for a month and trend, there is no way on earth I will be able to hold hands on a daily basis to figure out what is happening.

## 2023-03-27 NOTE — Telephone Encounter (Signed)
 Dtr made aware of Dr. Verl Dicker recommendation to remain on daily Lasix.  She also says she needs a letter from Dr. Jacinto Halim for pt's long term care company. It will need to state that he is MD for patient and supporting information for her HF dx -- this will activate long care insurance (help in home w/ baths/help dressing/meals/transportation) Aware forwarding to the RN to address and follow up with her when she returns.

## 2023-03-27 NOTE — Telephone Encounter (Signed)
 Follow Up:        Patient's daughter is calling back. She would like to know what wasdecided please.

## 2023-03-28 NOTE — Telephone Encounter (Signed)
 Letter written and faxed.

## 2023-03-31 NOTE — Telephone Encounter (Signed)
 Yes please, let me know if I need to draft

## 2023-04-01 ENCOUNTER — Encounter: Payer: Self-pay | Admitting: Cardiology

## 2023-04-01 ENCOUNTER — Ambulatory Visit: Payer: Federal, State, Local not specified - PPO | Attending: Cardiology | Admitting: Cardiology

## 2023-04-01 VITALS — BP 112/69 | HR 86 | Resp 16 | Ht 60.0 in | Wt 110.8 lb

## 2023-04-01 DIAGNOSIS — I5032 Chronic diastolic (congestive) heart failure: Secondary | ICD-10-CM

## 2023-04-01 DIAGNOSIS — I10 Essential (primary) hypertension: Secondary | ICD-10-CM

## 2023-04-01 DIAGNOSIS — I2510 Atherosclerotic heart disease of native coronary artery without angina pectoris: Secondary | ICD-10-CM | POA: Diagnosis not present

## 2023-04-01 MED ORDER — FUROSEMIDE 20 MG PO TABS: 20.0000 mg | ORAL_TABLET | Freq: Every day | ORAL | 3 refills | Status: DC

## 2023-04-01 MED ORDER — LOSARTAN POTASSIUM 25 MG PO TABS: 12.5000 mg | ORAL_TABLET | Freq: Every day | ORAL | 3 refills | Status: DC

## 2023-04-01 NOTE — Progress Notes (Signed)
 Cardiology Office Note:  .   Date:  04/01/2023  ID:  [name], DOB Jul 23, 1931, MRN [id number] PCP: Ollen Bowl, MD  [facility] Providers Cardiologist:  Yates Decamp, MD Structural Heart:  Verne Carrow, MD  History of Present Illness: .   [name] [name] is a 89 y.o. 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 Underwent elective TAVR on 01/26/2019.     The patient was recently hospitalized from February 2 through February 28, 2023.  She was noted to have hyponatremia and she was treated with IV fluids felt to be hypovolemic hyponatremia in the setting of diarrhea.  She also has a history of liver cirrhosis and portal hypertension.    Discussed the use of AI scribe software for clinical note transcription with the patient, who gave verbal consent to proceed.  History of Present Illness   The patient, with a history of heart failure, coronary artery disease, dyspnea, TAVR, chronic anemia, and stage 3A chronic kidney disease, presents with an itchy rash on her legs and arms. She describes the rash as starting with little red spots that then change in appearance. The rash is attributed to Plavix. She also reports persistent fatigue, which she describes as so severe that she often wants to lay down and sleep. She also reports shortness of breath, which has improved slightly. She has no leg swelling and her weight is down. She is currently receiving help at home from a certified nursing assistant, particularly with tasks such as getting out of the shower, and is seeking to activate her long-term care insurance.      Labs   Lab Results  Component Value Date   CHOL 175 09/23/2022   HDL 63 09/23/2022   LDLCALC 99  09/23/2022   TRIG 65 09/23/2022   CHOLHDL 2.8 09/23/2022   Lab Results  Component Value Date   NA 135 03/06/2023   K 3.9 03/06/2023   CO2 26 03/06/2023   GLUCOSE 118 (H) 03/06/2023   BUN 20 03/06/2023   CREATININE 1.30 (H) 03/06/2023   CALCIUM 8.4 (L) 03/06/2023   EGFR 39 (L) 03/06/2023   GFRNONAA 37 (L) 02/28/2023      Latest Ref Rng & Units 03/06/2023   11:38 AM 02/28/2023    5:43 AM 02/27/2023    4:32 AM  BMP  Glucose 70 - 99 mg/dL 401  027  253   BUN 8 - 27 mg/dL 20  14  13    Creatinine 0.57 - 1.00 mg/dL 6.64  4.03  4.74   BUN/Creat Ratio 12 - 28 15     Sodium 134 - 144 mmol/L 135  130  129   Potassium 3.5 - 5.2 mmol/L 3.9  3.9  3.6   Chloride 96 - 106 mmol/L 95  100  99   CO2 20 - 29 mmol/L 26  23  22    Calcium 8.7 - 10.3 mg/dL 8.4  8.3  8.2       Latest Ref Rng & Units 02/26/2023    4:45 AM 02/25/2023    4:23 AM 02/23/2023    1:57 PM  CBC  WBC 4.0 - 10.5 K/uL 8.0  7.7  13.8   Hemoglobin 12.0 - 15.0 g/dL 25.9  9.9  56.3  Hematocrit 36.0 - 46.0 % 30.5  29.2  33.8   Platelets 150 - 400 K/uL 224  224  299    Lab Results  Component Value Date   HGBA1C 5.9 (H) 11/28/2022    Lab Results  Component Value Date   TSH 5.712 (H) 02/24/2023    Review of Systems  Cardiovascular:  Positive for dyspnea on exertion. Negative for chest pain and leg swelling.   Physical Exam:   VS:  BP 112/69 (BP Location: Left Arm, Patient Position: Sitting, Cuff Size: Normal)   Pulse 86   Resp 16   Ht 5' (1.524 m)   Wt 110 lb 12.8 oz (50.3 kg)   SpO2 96%   BMI 21.64 kg/m    Wt Readings from Last 3 Encounters:  04/01/23 110 lb 12.8 oz (50.3 kg)  03/07/23 117 lb 6.4 oz (53.3 kg)  03/03/23 123 lb 3.2 oz (55.9 kg)    Physical Exam Neck:     Vascular: No JVD.  Cardiovascular:     Rate and Rhythm: Normal rate and regular rhythm.     Pulses: Intact distal pulses.     Heart sounds: S1 normal and S2 normal. Murmur heard.     Midsystolic murmur is present with a grade of 3/6 at the upper  right sternal border.     No gallop.  Pulmonary:     Effort: Pulmonary effort is normal.     Breath sounds: Normal breath sounds.  Abdominal:     General: Bowel sounds are normal.     Palpations: Abdomen is soft.  Musculoskeletal:     Right lower leg: No edema.     Left lower leg: No edema.    Studies Reviewed: Marland Kitchen    ECHOCARDIOGRAM COMPLETE 09/23/2022  1. Diastology may not be accurate in presence of MV and MV annular calcification. Left ventricular ejection fraction, by estimation, is 60 to 65%. The left ventricle has normal function. The left ventricle has no regional wall motion abnormalities. There is mild left ventricular hypertrophy. Left ventricular diastolic parameters are consistent with Grade II diastolic dysfunction (pseudonormalization). Elevated left ventricular end-diastolic pressure. 2. Right ventricular systolic function is normal. The right ventricular size is normal. There is normal pulmonary artery systolic pressure. The estimated right ventricular systolic pressure is 26.3 mmHg. 3. Left atrial size was moderately dilated. 4. The mitral valve is degenerative. Mild mitral valve regurgitation. No evidence of mitral stenosis. Moderate mitral annular calcification. 5. Normal TAVR function. The aortic valve is tricuspid. Aortic valve regurgitation is not visualized. Aortic valve sclerosis is present, with no evidence of aortic valve stenosis. There is a 23 mm CoreValve-EvolutR prosthetic (TAVR) valve present in the aortic position. Procedure Date: 01/26/19.   Comparison(s): Compared to the study 05/05/2020, mild AV perivalvular leak not seen in the present study. EKG:         Medications and allergies    Allergies  Allergen Reactions   Aspirin Anaphylaxis   Bee Venom Anaphylaxis   Ceclor [Cefaclor] Anaphylaxis   Simvastatin Other (See Comments)    Hair loss   Pneumococcal Vac Polyvalent Swelling    Other reaction(s): swelling and erythema   Statins Other (See Comments)     Elevated liver enzymes   Crestor [Rosuvastatin] Rash   Sulfa Antibiotics Rash     Current Outpatient Medications:    albuterol (VENTOLIN HFA) 108 (90 Base) MCG/ACT inhaler, Inhale 2 puffs into the lungs every 6 (six) hours as needed for shortness of breath (use as  needed or before activity for shortness of breath or whezzing)., Disp: 8.5 each, Rfl: 6   Artificial Saliva (BIOTENE DRY MOUTH) LOZG, Use as directed 1 Piece in the mouth or throat as needed (dry mouth)., Disp: , Rfl:    atorvastatin (LIPITOR) 10 MG tablet, Take 0.5 tablets (5 mg total) by mouth daily., Disp: , Rfl:    clopidogrel (PLAVIX) 75 MG tablet, TAKE 1 TABLET BY MOUTH EVERY DAY, Disp: 90 tablet, Rfl: 1   diphenhydrAMINE (BENADRYL) 25 mg capsule, Take 25 mg by mouth every 6 (six) hours as needed for allergies., Disp: , Rfl:    EPINEPHrine 0.3 mg/0.3 mL IJ SOAJ injection, Inject 0.3 mg into the muscle as needed for anaphylaxis (for bee stings - Patient has one on hand)., Disp: , Rfl:    fexofenadine (ALLEGRA) 180 MG tablet, Take 180 mg by mouth daily as needed for allergies or rhinitis., Disp: , Rfl:    levothyroxine (SYNTHROID) 50 MCG tablet, Take 50 mcg by mouth daily before breakfast. In am, Disp: , Rfl:    losartan (COZAAR) 25 MG tablet, Take 0.5 tablets (12.5 mg total) by mouth daily., Disp: 45 tablet, Rfl: 3   meclizine (ANTIVERT) 25 MG tablet, Take 25 mg by mouth 3 (three) times daily as needed for dizziness., Disp: , Rfl:    mometasone (NASONEX) 50 MCG/ACT nasal spray, Place 2 sprays into the nose daily., Disp: , Rfl:    montelukast (SINGULAIR) 10 MG tablet, Take 1 tablet (10 mg total) by mouth at bedtime., Disp: 90 tablet, Rfl: 3   Multiple Vitamin (MULTIVITAMIN WITH MINERALS) TABS tablet, Take 1 tablet by mouth every other day., Disp: , Rfl:    oxyCODONE (OXY IR/ROXICODONE) 5 MG immediate release tablet, Take 1 tablet (5 mg total) by mouth every 6 (six) hours as needed for moderate pain (pain score 4-6)., Disp: 15  tablet, Rfl: 0   Peak Flow Meter DEVI, 1 puff by Does not apply route in the morning and at bedtime., Disp: 1 each, Rfl: 0   Polyethyl Glycol-Propyl Glycol (SYSTANE) 0.4-0.3 % SOLN, Place 1 drop into both eyes daily as needed (dry/irritated eyes.)., Disp: , Rfl:    potassium chloride (KLOR-CON) 10 MEQ tablet, Take 1 tablet (10 mEq total) by mouth daily., Disp: 90 tablet, Rfl: 3   propranolol (INDERAL) 10 MG tablet, Take 1 tablet (10 mg total) by mouth 2 (two) times daily., Disp: 180 tablet, Rfl: 1   sodium chloride (OCEAN) 0.65 % nasal spray, Place 1 spray into the nose every other day., Disp: , Rfl:    Spacer/Aero-Holding Chambers (AEROCHAMBER PLS FLOVU MTHPIECE) DEVI, , Disp: , Rfl:    Tiotropium Bromide-Olodaterol (STIOLTO RESPIMAT) 2.5-2.5 MCG/ACT AERS, INHALE 2 PUFFS BY MOUTH INTO THE LUNGS DAILY, Disp: 12 g, Rfl: 1   furosemide (LASIX) 20 MG tablet, Take 1 tablet (20 mg total) by mouth daily. With potassium chloride, Disp: 90 tablet, Rfl: 3   ASSESSMENT AND PLAN: .      ICD-10-CM   1. Chronic heart failure with preserved ejection fraction (HCC)  I50.32 furosemide (LASIX) 20 MG tablet    losartan (COZAAR) 25 MG tablet    ECHOCARDIOGRAM COMPLETE    2. Coronary artery disease involving native coronary artery of native heart without angina pectoris  I25.10 ECHOCARDIOGRAM COMPLETE    3. Primary hypertension  I10       Assessment and Plan    Heart failure with preserved ejection fraction (HFpEF)   She has HFpEF, managed with propranolol and  furosemide. Breathing has improved, but fatigue and dyspnea on exertion persist. Weight reduction from fluid management is expected to further improve breathing. An echocardiogram is needed to reassess heart function due to a more prominent heart murmur. Continue propranolol 10 mg twice daily and furosemide 20 mg once daily in the morning. Order an echocardiogram and start losartan 12.5 mg once daily.  Dyspnea on exertion, Class III   Experiences  Class III dyspnea on exertion, likely related to HFpEF and CAD. Monitor symptoms and adjust treatment as necessary.  Coronary artery disease (CAD)   She has CAD with angina pectoris and is on clopidogrel, which causes a rash. Continue clopidogrel despite the rash due to its importance in managing CAD.  Transcatheter aortic valve replacement (TAVR)   Underwent TAVR in 2021. A more prominent heart murmur warrants further evaluation with an echocardiogram. Order an echocardiogram to evaluate the heart murmur.  Chronic anemia   Chronic anemia may contribute to fatigue and dyspnea. Monitor anemia and adjust treatment as necessary.  Stage 3A chronic kidney disease (CKD)   Stage 3A CKD requires careful management of medications and monitoring of kidney function. Monitor kidney function and adjust medications as necessary.  Long-term care needs   She is seeking activation of long-term care insurance to assist with daily activities due to her medical conditions. A letter has been sent to the insurance company to support this request.  Follow-up   Follow-up is needed to monitor HFpEF, CAD, and other chronic conditions. Schedule a follow-up appointment in six months and provide an updated medication list.          Signed,  Yates Decamp, MD, [facility] 04/01/2023, 1:42 PM [facility] [address] [place], [place] [address] Phone: [phone]. Fax:  [phone]

## 2023-04-01 NOTE — Telephone Encounter (Signed)
 Letter written at office visit with Dr Jacinto Halim today

## 2023-04-01 NOTE — Patient Instructions (Signed)
 Medication Instructions:  Your physician has recommended you make the following change in your medication:  Start losartan 12.5 mg by mouth daily   *If you need a refill on your cardiac medications before your next appointment, please call your pharmacy*   Lab Work: none If you have labs (blood work) drawn today and your tests are completely normal, you will receive your results only by: MyChart Message (if you have MyChart) OR A paper copy in the mail If you have any lab test that is abnormal or we need to change your treatment, we will call you to review the results.   Testing/Procedures: Your physician has requested that you have an echocardiogram. Echocardiography is a painless test that uses sound waves to create images of your heart. It provides your doctor with information about the size and shape of your heart and how well your heart's chambers and valves are working. This procedure takes approximately one hour. There are no restrictions for this procedure. Please do NOT wear cologne, perfume, aftershave, or lotions (deodorant is allowed). Please arrive 15 minutes prior to your appointment time.  Please note: We ask at that you not bring children with you during ultrasound (echo/ vascular) testing. Due to room size and safety concerns, children are not allowed in the ultrasound rooms during exams. Our front office staff cannot provide observation of children in our lobby area while testing is being conducted. An adult accompanying a patient to their appointment will only be allowed in the ultrasound room at the discretion of the ultrasound technician under special circumstances. We apologize for any inconvenience.    Follow-Up: At [facility], you and your health needs are our priority.  As part of our continuing mission to provide you with exceptional heart care, we have created designated Provider Care Teams.  These Care Teams include your primary Cardiologist (physician)  and Advanced Practice Providers (APPs -  Physician Assistants and Nurse Practitioners) who all work together to provide you with the care you need, when you need it.  We recommend signing up for the patient portal called "MyChart".  Sign up information is provided on this After Visit Summary.  MyChart is used to connect with patients for Virtual Visits (Telemedicine).  Patients are able to view lab/test results, encounter notes, upcoming appointments, etc.  Non-urgent messages can be sent to your provider as well.   To learn more about what you can do with MyChart, go to [email or web address].    Your next appointment:   6 month(s)  Provider:   Yates Decamp, MD     Other Instructions

## 2023-04-02 ENCOUNTER — Telehealth: Payer: Self-pay | Admitting: Cardiology

## 2023-04-02 NOTE — Telephone Encounter (Signed)
 It is in her chart and she can print or please forward it to her. I have routed it again to her in box

## 2023-04-02 NOTE — Telephone Encounter (Signed)
 Patient she and Dr. Jacinto Halim discussed he was to was to e-mail patient a letter for long-term care insurance and she hasn't received anything.

## 2023-04-03 NOTE — Telephone Encounter (Signed)
 Called pt left a detailed message (ok per DPR) advising letter is in the letters tab on my chart.  If pt would like our office to print letter out and leave at the front desk will need to call or send a my chart message with this request.

## 2023-04-08 NOTE — Telephone Encounter (Signed)
 Called spoke with pt daughter Amy.  Reports she was able to get letter generated by Dr. Theotis Burrow from my chart.  No further needs at this time.

## 2023-04-09 ENCOUNTER — Ambulatory Visit (HOSPITAL_BASED_OUTPATIENT_CLINIC_OR_DEPARTMENT_OTHER)

## 2023-04-09 ENCOUNTER — Ambulatory Visit (HOSPITAL_BASED_OUTPATIENT_CLINIC_OR_DEPARTMENT_OTHER): Payer: Federal, State, Local not specified - PPO | Admitting: Pulmonary Disease

## 2023-04-09 ENCOUNTER — Encounter (HOSPITAL_BASED_OUTPATIENT_CLINIC_OR_DEPARTMENT_OTHER): Payer: Self-pay | Admitting: Pulmonary Disease

## 2023-04-09 VITALS — BP 100/64 | HR 88 | Ht 60.0 in | Wt 113.9 lb

## 2023-04-09 DIAGNOSIS — J439 Emphysema, unspecified: Secondary | ICD-10-CM

## 2023-04-09 DIAGNOSIS — R911 Solitary pulmonary nodule: Secondary | ICD-10-CM

## 2023-04-09 DIAGNOSIS — R0602 Shortness of breath: Secondary | ICD-10-CM

## 2023-04-09 DIAGNOSIS — J432 Centrilobular emphysema: Secondary | ICD-10-CM

## 2023-04-09 DIAGNOSIS — R29898 Other symptoms and signs involving the musculoskeletal system: Secondary | ICD-10-CM

## 2023-04-09 MED ORDER — STIOLTO RESPIMAT 2.5-2.5 MCG/ACT IN AERS: 2.0000 | INHALATION_SPRAY | Freq: Every day | RESPIRATORY_TRACT | 3 refills | Status: DC

## 2023-04-09 NOTE — Patient Instructions (Signed)
 Asthma/Emphysema - persistent shortness of breath. Deconditioning is likely contributing --CONTINUE Stiolto 2.5/2.5 mcg TWO puffs ONCE a day. REFILL --CONTINUE Albuterol as needed for shortness of breath or wheezing. OK to take before activity --CONTINUE Singulair 10 mg daily.  --CONTINUE physical therapy --ORDER CXR. Will call if abnormal

## 2023-04-09 NOTE — Progress Notes (Signed)
 Subjective:   PATIENT ID: [name] [name] GENDER: female DOB: 1931-04-28, MRN: [id number]   HPI  Chief Complaint  Patient presents with   Follow-up    Centrilobular emphysema   Reason for Visit: Follow-up  [name] is a 89 year old female never smoker with emphysema, CAD s/p stent, AVS s/pt TAVR 01/2019, pulmonary nodules who presents for follow-up.  Synopsis:  She was referred to Pulmonary after being discharged from the hospital in 04/2020. Per review of discharge note she required 1-2L O2 in the ED. V/Q scan was negative for PE. While inpatient she was diuresed. Cardiology was consulted and plan for outpatient nuclear stress test. She was also referred to Pulmonary to consider PFT for evaluation of her emphysema. At time of discharge she did not require any oxygen. She has been on lasix and has noticed weight gain of 10lbs post-discharge. This morning is 116lb, baseline is 113-115lb.  She reports childhood asthma and respiratory complications during pregnancy in her 20-30s. She was on a nebulizer 2-3 x daily in her 50-60s and needing steroids at least twice a year. However in her 70s-80s she has not had any issues or needing any medications for her asthma and this may have been related from removing environmental issues (older home, dust, tobacco farms, gardening)  She has had multiple sinus surgery x 4 including for nasal polyp removal. She was seen by ENT yesterday. She takes zyrtec and saline rinses. She is scheduled for stress test next week with [facility].  05/18/20 Since our last visit, she has been tolerating Spiriva and feels that it is improving her shortness of breath. Her daughter is present with her daughter and wish to review PFT results.  10/02/20 Since our last visit she was seen by ENT and underwent sinus surgery (sinus fusion and intranasal polypectomy) in Aug 2022 with path returning with pseudomonas and fungus. She was treated with a course of  amoxicillin. Last ENT visit was 09/29/20 and started on nasal rinses and sprays. Not sure if this is effective yet since she recently started. She is on diuretics which she manages diligently with daily weight checks. She has been referred to a Nephrology. She tolerating the Spiriva and feels it is helping her. Uses albuterol on average once a week for shortness of breath.    12/28/20 Since our last visit she reports she is overall well controlled. Her shortness of breath is stable. She is able to perform activities around the house but she does not go upstairs. Improved intermittent cough. No wheezing. Uses albuterol once a week.  08/17/21 She reports worsening shortness of breath and has been evaluated by Cardiology. Stress test negative. She reports worsening ability to take a deep breath. Has been taking her albuterol daily more frequently. She is active at baseline. Occasional cough since her COVID infection at Christmas. Compliant with Spiriva. Daughter on the phone reports she isn't compliant with her lasix   10/22/21 Since our last visit she reports that she has some good days and some bad days for shortness of breath. Compliant with Stiolto which is improving cough. She is taking lasix as needed and not currently having any increased weight or leg swelling. She is using albuterol inhaler once a week but thinks she should be using it more. Not very active at baseline. Has to care for her husband and feels her plate is full.  04/08/22 Since our last visit she is compliant with her Stiolto with improved cough.  Will occur while laying down which she attributes to her hiatal hernia. She uses albuterol three times a month. Denies wheezing.On days she does have shortness of breath, lasix will resolve this. Her appetite is not as robust and drinking boost 3-4 days a week. Eats mainly fruits and vegetables. Avoids beef.   10/29/22 Since our last visit her husband has had some health issues and starting long  term care assistance. She also had a central retinal occlusion that has affected her vision. She has been fatigued lately. She has been feeling short of breath with activity. Her husband has been participating in physical therapy and she has noticed that she is unable to keep up with him. Occasional cough. No wheezing. She is compliant with Stiolto. Uses albuterol 1-2 times a week.  04/09/23 Since our last visit she has been seen in the ED in October 2024 and Jan 2025 for abdominal/hernia pain. She underwent hernia repair on 02/17/23 for right inguinal hernia and umbilical hernia. She required hospitalization 02/23/23-02/28/23 for diarrhea. Work-up neg stool studies. Diarrhea thought viral. Treated with IVF for hypovolemic hyponatremia. Pulmonary was consulted for shortness of breath however patient on room air and chest imaging with noncontributory small pleural effusion; dyspnea attributed to severe deconditioning. She has been seen by Cardiology and medically optimized with GDMT including daily diuresis. She continues to have shortness of breath with activity. Ambulating with a walker. Currently working with PT twice a week and OT twice a week at home. Reports dry cough associated with dry mouth. Denies wheezing. Rarely using albuterol. Performing breathing exercises daily.  Social History: Never smoker Her mother was diagnosed with tuberculosis Wood burning stove  Past Medical History:  Diagnosis Date   Anemia    Arthritis    Asthma    history of   CAD (coronary artery disease)    Cirrhosis (HCC)    CKD (chronic kidney disease)    Dyspnea    sometimes sitting.lying and on exertion   Emphysema lung (HCC)    GERD (gastroesophageal reflux disease)    History of hiatal hernia    History of transcatheter aortic valve replacement (TAVR) 01/26/2019   (TAVR  23 mm Evolute Pro in aortic position 01/26/2019) .    Hyperlipidemia    Hypertension    Hypothyroidism    Lipoma    left arm   Nasal polyps     Pneumonia    Pulmonary nodule    noted on pre TAVR CT   Samter's triad    Severe aortic stenosis    TIA (transient ischemic attack)      Allergies  Allergen Reactions   Aspirin Anaphylaxis   Bee Venom Anaphylaxis   Ceclor [Cefaclor] Anaphylaxis   Simvastatin Other (See Comments)    Hair loss   Pneumococcal Vac Polyvalent Swelling    Other reaction(s): swelling and erythema   Statins Other (See Comments)    Elevated liver enzymes   Crestor [Rosuvastatin] Rash   Sulfa Antibiotics Rash     Outpatient Medications Prior to Visit  Medication Sig Dispense Refill   albuterol (VENTOLIN HFA) 108 (90 Base) MCG/ACT inhaler Inhale 2 puffs into the lungs every 6 (six) hours as needed for shortness of breath (use as needed or before activity for shortness of breath or whezzing). 8.5 each 6   Artificial Saliva (BIOTENE DRY MOUTH) LOZG Use as directed 1 Piece in the mouth or throat as needed (dry mouth).     atorvastatin (LIPITOR) 10 MG tablet Take 0.5 tablets (  5 mg total) by mouth daily.     clopidogrel (PLAVIX) 75 MG tablet TAKE 1 TABLET BY MOUTH EVERY DAY 90 tablet 1   diphenhydrAMINE (BENADRYL) 25 mg capsule Take 25 mg by mouth every 6 (six) hours as needed for allergies.     EPINEPHrine 0.3 mg/0.3 mL IJ SOAJ injection Inject 0.3 mg into the muscle as needed for anaphylaxis (for bee stings - Patient has one on hand).     fexofenadine (ALLEGRA) 180 MG tablet Take 180 mg by mouth daily as needed for allergies or rhinitis.     furosemide (LASIX) 20 MG tablet Take 1 tablet (20 mg total) by mouth daily. With potassium chloride 90 tablet 3   levothyroxine (SYNTHROID) 50 MCG tablet Take 50 mcg by mouth daily before breakfast. In am     losartan (COZAAR) 25 MG tablet Take 0.5 tablets (12.5 mg total) by mouth daily. 45 tablet 3   meclizine (ANTIVERT) 25 MG tablet Take 25 mg by mouth 3 (three) times daily as needed for dizziness.     mometasone (NASONEX) 50 MCG/ACT nasal spray Place 2 sprays into  the nose daily.     montelukast (SINGULAIR) 10 MG tablet Take 1 tablet (10 mg total) by mouth at bedtime. 90 tablet 3   Multiple Vitamin (MULTIVITAMIN WITH MINERALS) TABS tablet Take 1 tablet by mouth every other day.     Peak Flow Meter DEVI 1 puff by Does not apply route in the morning and at bedtime. 1 each 0   Polyethyl Glycol-Propyl Glycol (SYSTANE) 0.4-0.3 % SOLN Place 1 drop into both eyes daily as needed (dry/irritated eyes.).     potassium chloride (KLOR-CON) 10 MEQ tablet Take 1 tablet (10 mEq total) by mouth daily. 90 tablet 3   propranolol (INDERAL) 10 MG tablet Take 1 tablet (10 mg total) by mouth 2 (two) times daily. 180 tablet 1   sodium chloride (OCEAN) 0.65 % nasal spray Place 1 spray into the nose every other day.     Spacer/Aero-Holding Chambers (AEROCHAMBER PLS FLOVU MTHPIECE) DEVI      Tiotropium Bromide-Olodaterol (STIOLTO RESPIMAT) 2.5-2.5 MCG/ACT AERS INHALE 2 PUFFS BY MOUTH INTO THE LUNGS DAILY 12 g 1   oxyCODONE (OXY IR/ROXICODONE) 5 MG immediate release tablet Take 1 tablet (5 mg total) by mouth every 6 (six) hours as needed for moderate pain (pain score 4-6). (Patient not taking: Reported on 04/09/2023) 15 tablet 0   No facility-administered medications prior to visit.    Review of Systems  Constitutional:  Positive for malaise/fatigue. Negative for chills, diaphoresis, fever and weight loss.  HENT:  Negative for congestion.   Respiratory:  Positive for cough and shortness of breath. Negative for hemoptysis, sputum production and wheezing.   Cardiovascular:  Negative for chest pain, palpitations and leg swelling.     Objective:   Vitals:   04/09/23 1050  BP: 100/64  Pulse: 88  SpO2: 97%  Weight: 113 lb 14.4 oz (51.7 kg)  Height: 5' (1.524 m)   SpO2: 97 %  Physical Exam: General: Frail-appearing, no acute distress HENT: [place], AT Eyes: EOMI, no scleral icterus Respiratory: Clear to auscultation bilaterally.  No crackles, wheezing or rales Cardiovascular:  RRR, -M/R/G, no JVD Extremities:-Edema,-tenderness Neuro: AAO x4, CNII-XII grossly intact Psych: Normal mood, normal affect   Data Reviewed:  Imaging: CT Chest 12/23/19 - S/p SVR. Tortuous thoracic aorta. Mild centrilobular emphysema. Large hiatal hernia. Stable pulmonary nodules including RML 7x5 mm and LLL ~4.5 mm, unchanged from prior imaging  CT Chest 12/25/20 - Unchanged 7 mm nodule, stable subcentimeter nodules. Large hiatal hernia.   CXR 10/22/21 - No acute infiltrate, effusion or edema. Large hiatal hernia  CT CAP 02/23/23 - Visualized parenchyma with new small pleural effusion and atelectasis on left side. RUL with tree in bud.  CXR 04/09/23 - On my read, no infiltrate effusion or edema. Large hiatal hernia  PFT: 05/05/20 FVC 1.7 (91%) FEV1 1.26 (93%) Ratio 73  TLC 84% DLCO 43% Interpretation:  Normal spirometry. No obstructive or restrictive defect  Labs: WBC 05/04/20 9.9 Absolute eos 05/04/20 200  Assessment & Plan:   Discussion: 89 year old female with asthma, emphysema on CT who presents for follow-up. Reviewed hospital course and chest imaging. CXR today without effusion seen. She has continued dyspnea. Normal O2 sats. Suspect deconditioning is primarily driving her dyspnea. No changes to bronchodilators. Encouraged PT and activity as tolerated.  Prior Inhalers Spiriva - good except for cough Stiolto - improved cough control   Reviewed hospital course  Asthma/Emphysema - persistent shortness of breath. Deconditioning is likely contributing --CONTINUE Stiolto 2.5/2.5 mcg TWO puffs ONCE a day. REFILL --CONTINUE Albuterol as needed for shortness of breath or wheezing. OK to take before activity --CONTINUE Singulair 10 mg daily.  --CONTINUE physical therapy --ORDER CXR. Will call if abnormal  Addendum: Called patient. No acute abnormalities  History of TB exposure --QF-TB neg  RML lung nodule Stable subcentimeter nodules --Stable 7 mm nodule since 2020 --No  further follow-up needed. Patient not interested in aggressive management  Health Maintenance Immunization History  Administered Date(s) Administered   Fluad Quad(high Dose 65+) 11/05/2021   Influenza Split 10/19/2012, 10/07/2016, 10/25/2019   Influenza Whole 10/27/2015   Influenza, High Dose Seasonal PF 11/08/2009, 10/18/2011, 10/10/2014, 10/27/2015, 11/05/2017, 10/08/2022   Influenza-Unspecified 11/08/2009, 10/18/2011, 10/10/2014, 10/27/2015, 11/05/2017   Moderna Sars-Covid-2 Vaccination 02/25/2019   PFIZER Comirnaty(Gray Top)Covid-19 Tri-Sucrose Vaccine 10/12/2021   PFIZER(Purple Top)SARS-COV-2 Vaccination 02/25/2019, 03/24/2019, 11/08/2019, 06/16/2020   Pfizer Covid-19 Vaccine Bivalent Booster 56yrs & up 10/05/2020, 10/09/2021, 09/17/2022   Pneumococcal Conjugate-13 09/21/2014, 11/24/2015   Pneumococcal Polysaccharide-23 04/29/1998, 01/21/2006, 06/05/2006, 05/10/2013   Td 06/22/2003   Td (Adult) 06/22/2003   Tdap 09/09/2013, 10/27/2015   Zoster, Live 06/05/2006, 07/22/2017, 10/08/2017   CT Lung Screen - not indicated  Orders Placed This Encounter  Procedures   DG Chest 2 View    Standing Status:   Future    Number of Occurrences:   1    Expiration Date:   04/08/2024    Reason for Exam (SYMPTOM  OR DIAGNOSIS REQUIRED):   shortness of breath    Preferred imaging location?:   [facility]   Meds ordered this encounter  Medications   Tiotropium Bromide-Olodaterol (STIOLTO RESPIMAT) 2.5-2.5 MCG/ACT AERS    Sig: Inhale 2 puffs into the lungs daily at 2 PM.    Dispense:  12 g    Refill:  3   Return in about 3 months (around 07/10/2023) for with NP.  I have spent a total time of 32-minutes on the day of the appointment including chart review, data review, collecting history, coordinating care and discussing medical diagnosis and plan with the patient/family. Past medical history, allergies, medications were reviewed. Pertinent imaging, labs and tests included in this note  have been reviewed and interpreted independently by me.  Amesha Bailey Mechele Collin, MD [facility] 04/09/2023

## 2023-04-24 ENCOUNTER — Ambulatory Visit (HOSPITAL_COMMUNITY): Attending: Cardiology

## 2023-04-24 DIAGNOSIS — I2510 Atherosclerotic heart disease of native coronary artery without angina pectoris: Secondary | ICD-10-CM

## 2023-04-24 DIAGNOSIS — I517 Cardiomegaly: Secondary | ICD-10-CM

## 2023-04-24 DIAGNOSIS — I5032 Chronic diastolic (congestive) heart failure: Secondary | ICD-10-CM

## 2023-04-24 DIAGNOSIS — I088 Other rheumatic multiple valve diseases: Secondary | ICD-10-CM

## 2023-04-24 DIAGNOSIS — I5030 Unspecified diastolic (congestive) heart failure: Secondary | ICD-10-CM | POA: Diagnosis not present

## 2023-04-24 LAB — ECHOCARDIOGRAM COMPLETE
AR max vel: 2.27 cm2
AV Area VTI: 2.5 cm2
AV Area mean vel: 2.46 cm2
AV Mean grad: 10 mmHg
AV Peak grad: 18.7 mmHg
Ao pk vel: 2.16 m/s
Area-P 1/2: 3.48 cm2
S' Lateral: 1.6 cm

## 2023-04-25 ENCOUNTER — Encounter: Payer: Self-pay | Admitting: Cardiology

## 2023-04-25 NOTE — Progress Notes (Signed)
 Very stable echocardiogram with normal LVEF, prosthetic aortic valve functioning normally.  Minor other abnormalities but nothing of concern.

## 2023-04-28 ENCOUNTER — Other Ambulatory Visit: Payer: Self-pay | Admitting: Cardiology

## 2023-04-28 DIAGNOSIS — I2510 Atherosclerotic heart disease of native coronary artery without angina pectoris: Secondary | ICD-10-CM

## 2023-06-02 ENCOUNTER — Other Ambulatory Visit: Payer: Self-pay | Admitting: Cardiology

## 2023-06-02 DIAGNOSIS — E7800 Pure hypercholesterolemia, unspecified: Secondary | ICD-10-CM

## 2023-06-02 DIAGNOSIS — I2510 Atherosclerotic heart disease of native coronary artery without angina pectoris: Secondary | ICD-10-CM

## 2023-07-15 ENCOUNTER — Encounter (HOSPITAL_BASED_OUTPATIENT_CLINIC_OR_DEPARTMENT_OTHER): Payer: Self-pay | Admitting: Primary Care

## 2023-07-15 ENCOUNTER — Ambulatory Visit (HOSPITAL_BASED_OUTPATIENT_CLINIC_OR_DEPARTMENT_OTHER): Admitting: Primary Care

## 2023-07-15 VITALS — BP 94/55 | HR 73 | Ht 60.0 in | Wt 112.0 lb

## 2023-07-15 DIAGNOSIS — J432 Centrilobular emphysema: Secondary | ICD-10-CM

## 2023-07-15 DIAGNOSIS — K449 Diaphragmatic hernia without obstruction or gangrene: Secondary | ICD-10-CM

## 2023-07-15 DIAGNOSIS — J4530 Mild persistent asthma, uncomplicated: Secondary | ICD-10-CM | POA: Diagnosis not present

## 2023-07-15 DIAGNOSIS — R058 Other specified cough: Secondary | ICD-10-CM

## 2023-07-15 MED ORDER — FAMOTIDINE 20 MG PO TABS: 20.0000 mg | ORAL_TABLET | Freq: Every day | ORAL | 5 refills | Status: DC

## 2023-07-15 NOTE — Patient Instructions (Signed)
  VISIT SUMMARY: Today, you had a follow-up appointment to review your asthma, emphysema, and other health concerns. Your asthma and emphysema are well-managed with your current medication, and you have only needed your rescue inhaler once in the past week. We also discussed your hiatal hernia and potential GERD symptoms, and a new medication was prescribed to help with these issues. Your pulmonary nodule remains stable, and no further imaging is needed at this time. We also reviewed your history of TB exposure and your aortic valve replacement.  YOUR PLAN: -ASTHMA AND EMPHYSEMA: Your asthma and emphysema are being well-managed with your current medication, Stiolto, which you take once daily. You have only needed your albuterol  rescue inhaler once in the past week, which is a good sign. Continue taking Stiolto daily and use the albuterol  inhaler as needed for shortness of breath.  -HIATAL HERNIA WITH SUSPECTED GERD: A hiatal hernia occurs when part of your stomach pushes up through your diaphragm. This can cause symptoms like a dry cough and frequent burping, which may be related to GERD (acid reflux). To help manage these symptoms, you have been prescribed famotidine (Pepcid) to take at bedtime. If your symptoms persist, you may need to take it twice daily. Continue using Nasonex, Singulair , and Allegra, and use sugarless hard candies to help with dry mouth.  -PULMONARY NODULE: A pulmonary nodule is a small growth in the lung. Your nodule has been stable since 2020, and no changes have been observed. No further imaging is needed at this time.  INSTRUCTIONS: Please schedule a follow-up appointment for October after your primary doctor returns from maternity leave. If you do not have an appointment scheduled by mid-September, please contact the office.  Follow-up October with Dr. Kassie or sooner if needed

## 2023-07-15 NOTE — Progress Notes (Signed)
 @Patient  ID: [name] [name], female    DOB: Jan 08, 1932, 89 y.o.   MRN: [id number]  No chief complaint on file.   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  Synopsis:  She was referred to Pulmonary after being discharged from the hospital in 04/2020. Per review of discharge note she required 1-2L O2 in the ED. V/Q scan was negative for PE. While inpatient she was diuresed. Cardiology was consulted and plan for outpatient nuclear stress test. She was also referred to Pulmonary to consider PFT for evaluation of her emphysema. At time of discharge she did not require any oxygen. She has been on lasix  and has noticed weight gain of 10lbs post-discharge. This morning is 116lb, baseline is 113-115lb.  She reports childhood asthma and respiratory complications during pregnancy in her 20-30s. She was on a nebulizer 2-3 x daily in her 50-60s and needing steroids at least twice a year. However in her 70s-80s she has not had any issues or needing any medications for her asthma and this may have been related from removing environmental issues (older home, dust, tobacco farms, gardening)  She has had multiple sinus surgery x 4 including for nasal polyp removal. She was seen by ENT yesterday. She takes zyrtec and saline rinses. She is scheduled for stress test next week with [facility].  Previous [facility] pulmonary encounter:  04/09/23 Since our last visit she has been seen in the ED in October 2024 and Jan 2025 for abdominal/hernia pain. She underwent hernia repair on 02/17/23 for right inguinal hernia and umbilical hernia. She required hospitalization 02/23/23-02/28/23 for diarrhea. Work-up neg stool studies. Diarrhea thought viral. Treated with IVF for hypovolemic hyponatremia. Pulmonary was consulted for shortness of breath however patient on room air  and chest imaging with noncontributory small pleural effusion; dyspnea attributed to severe deconditioning. She has been seen by Cardiology and medically optimized with GDMT including daily diuresis. She continues to have shortness of breath with activity. Ambulating with a walker. Currently working with PT twice a week and OT twice a week at home. Reports dry cough associated with dry mouth. Denies wheezing. Rarely using albuterol . Performing breathing exercises daily.   Discussion: 89 year old female with asthma, emphysema on CT who presents for follow-up. No exacerbations on Stiolto. Worsening dyspnea does not correlate with worsening respiratory symptoms. Suspect deconditioning. Discussed clinical course and management of emphysema/asthma including bronchodilator regimen, preventive care including vaccinations and action plan for exacerbation.   Prior Inhalers Spiriva  - good except for cough Stiolto - improved cough control   Asthma/Emphysema - worsening shortness of breath. Suspect deconditioning is contributing --CONTINUE Stiolto 2.5/2.5 mcg TWO puffs ONCE a day. REFILL --CONTINUE Albuterol  as needed for shortness of breath or wheezing --CONTINUE Singulair  10 mg daily. REFILL --Refer to home physical therapy. Unable to participate in pulmonary rehab due to limited activity   History of TB exposure --QF-TB neg   RML lung nodule Stable subcentimeter nodules --Stable 7 mm nodule since 2020 --No further follow-up needed. Patient not interested in aggressive management  07/15/2023- Interim hx Discussed the use of AI scribe software for clinical note transcription with the patient, who gave verbal consent to proceed.  History of Present Illness   [name] [name] is an 89 year old female with asthma and emphysema who presents for a pulmonary follow-up.  She  has a history of asthma and emphysema, currently managed with Stiolto once daily. She has not needed her albuterol  rescue inhaler  frequently, using it only once in the past week. She avoids fast-paced activities and walking due to leg issues and cannot go upstairs. She is staying at a bed and breakfast while her house undergoes repairs.  She has a history of a hiatal hernia, which she describes as her stomach pushing up under her lung. She experiences a dry cough, which she attributes to dry mouth or sinus issues. She uses Nasonex nasal spray, Singulair , and saline spray frequently, and sugarless hard candies for dry mouth. No heartburn or acid reflux, but frequent burping is reported.  Her past medical history includes a stable pulmonary nodule that has not changed since 2020, and a history of TB exposure with a negative Quantiferon Gold test. She has undergone multiple sinus surgeries in the past.     Allergies  Allergen Reactions   Aspirin  Anaphylaxis   Bee Venom Anaphylaxis   Ceclor [Cefaclor] Anaphylaxis   Simvastatin  Other (See Comments)    Hair loss   Pneumococcal Vac Polyvalent Swelling    Other reaction(s): swelling and erythema   Statins Other (See Comments)    Elevated liver enzymes   Crestor  [Rosuvastatin ] Rash   Sulfa Antibiotics Rash    Immunization History  Administered Date(s) Administered   Fluad Quad(high Dose 65+) 11/05/2021   Influenza Split 10/19/2012, 10/07/2016, 10/25/2019   Influenza Whole 10/27/2015   Influenza, High Dose Seasonal PF 11/08/2009, 10/18/2011, 10/10/2014, 10/27/2015, 11/05/2017, 10/08/2022   Influenza-Unspecified 11/08/2009, 10/18/2011, 10/10/2014, 10/27/2015, 11/05/2017   Moderna Sars-Covid-2 Vaccination 02/25/2019   PFIZER Comirnaty(Gray Top)Covid-19 Tri-Sucrose Vaccine 10/12/2021   PFIZER(Purple Top)SARS-COV-2 Vaccination 02/25/2019, 03/24/2019, 11/08/2019, 06/16/2020   Pfizer Covid-19 Vaccine Bivalent Booster 53yrs & up 10/05/2020, 10/09/2021, 09/17/2022   Pneumococcal Conjugate-13 09/21/2014, 11/24/2015   Pneumococcal Polysaccharide-23 04/29/1998, 01/21/2006,  06/05/2006, 05/10/2013   Td 06/22/2003   Td (Adult) 06/22/2003   Tdap 09/09/2013, 10/27/2015   Zoster, Live 06/05/2006, 07/22/2017, 10/08/2017    Past Medical History:  Diagnosis Date   Anemia    Arthritis    Asthma    history of   CAD (coronary artery disease)    Cirrhosis (HCC)    CKD (chronic kidney disease)    Dyspnea    sometimes sitting.lying and on exertion   Emphysema lung (HCC)    GERD (gastroesophageal reflux disease)    History of hiatal hernia    History of transcatheter aortic valve replacement (TAVR) 01/26/2019   (TAVR  23 mm Evolute Pro in aortic position 01/26/2019) .    Hyperlipidemia    Hypertension    Hypothyroidism    Lipoma    left arm   Nasal polyps    Pneumonia    Pulmonary nodule    noted on pre TAVR CT   Samter's triad    Severe aortic stenosis    TIA (transient ischemic attack)     Tobacco History: Social History   Tobacco Use  Smoking Status Never  Smokeless Tobacco Never   Counseling given: Not Answered   Outpatient Medications Prior to Visit  Medication Sig Dispense Refill   albuterol  (VENTOLIN  HFA) 108 (90 Base) MCG/ACT inhaler Inhale 2 puffs into the lungs every 6 (six) hours as needed for shortness of breath (use as needed or before activity for shortness of breath or whezzing). 8.5 each 6   Artificial Saliva (BIOTENE DRY MOUTH) LOZG Use as directed 1 Piece in the mouth or throat  as needed (dry mouth).     atorvastatin  (LIPITOR) 10 MG tablet TAKE 1/2 TABLET BY MOUTH DAILY 45 tablet 2   clopidogrel  (PLAVIX ) 75 MG tablet TAKE 1 TABLET BY MOUTH EVERY DAY 90 tablet 3   diphenhydrAMINE  (BENADRYL ) 25 mg capsule Take 25 mg by mouth every 6 (six) hours as needed for allergies.     EPINEPHrine  0.3 mg/0.3 mL IJ SOAJ injection Inject 0.3 mg into the muscle as needed for anaphylaxis (for bee stings - Patient has one on hand).     fexofenadine (ALLEGRA) 180 MG tablet Take 180 mg by mouth daily as needed for allergies or rhinitis.      furosemide  (LASIX ) 20 MG tablet Take 1 tablet (20 mg total) by mouth daily. With potassium chloride  90 tablet 3   levothyroxine  (SYNTHROID ) 50 MCG tablet Take 50 mcg by mouth daily before breakfast. In am     losartan  (COZAAR ) 25 MG tablet Take 0.5 tablets (12.5 mg total) by mouth daily. 45 tablet 3   meclizine  (ANTIVERT ) 25 MG tablet Take 25 mg by mouth 3 (three) times daily as needed for dizziness.     mometasone (NASONEX) 50 MCG/ACT nasal spray Place 2 sprays into the nose daily.     montelukast  (SINGULAIR ) 10 MG tablet Take 1 tablet (10 mg total) by mouth at bedtime. 90 tablet 3   Multiple Vitamin (MULTIVITAMIN WITH MINERALS) TABS tablet Take 1 tablet by mouth every other day.     oxyCODONE  (OXY IR/ROXICODONE ) 5 MG immediate release tablet Take 1 tablet (5 mg total) by mouth every 6 (six) hours as needed for moderate pain (pain score 4-6). (Patient not taking: Reported on 04/09/2023) 15 tablet 0   Peak Flow Meter DEVI 1 puff by Does not apply route in the morning and at bedtime. 1 each 0   Polyethyl Glycol-Propyl Glycol (SYSTANE) 0.4-0.3 % SOLN Place 1 drop into both eyes daily as needed (dry/irritated eyes.).     potassium chloride  (KLOR-CON ) 10 MEQ tablet Take 1 tablet (10 mEq total) by mouth daily. 90 tablet 3   propranolol  (INDERAL ) 10 MG tablet Take 1 tablet (10 mg total) by mouth 2 (two) times daily. 180 tablet 1   sodium chloride  (OCEAN) 0.65 % nasal spray Place 1 spray into the nose every other day.     Spacer/Aero-Holding Chambers (AEROCHAMBER PLS FLOVU MTHPIECE) DEVI      Tiotropium Bromide-Olodaterol (STIOLTO RESPIMAT ) 2.5-2.5 MCG/ACT AERS Inhale 2 puffs into the lungs daily at 2 PM. 12 g 3   No facility-administered medications prior to visit.    Review of Systems  Review of Systems  Constitutional: Negative.   HENT: Negative.    Respiratory:  Positive for cough. Negative for shortness of breath and wheezing.   Cardiovascular: Negative.      Physical Exam  There were no  vitals taken for this visit. Physical Exam Constitutional:      Appearance: Normal appearance.  HENT:     Head: Normocephalic and atraumatic.   Cardiovascular:     Rate and Rhythm: Normal rate and regular rhythm.     Heart sounds: Murmur heard.  Pulmonary:     Effort: Pulmonary effort is normal.     Breath sounds: Normal breath sounds. No wheezing or rhonchi.   Musculoskeletal:        General: Normal range of motion.   Skin:    General: Skin is warm and dry.   Neurological:     General: No focal deficit present.  Mental Status: She is alert and oriented to person, place, and time. Mental status is at baseline.   Psychiatric:        Mood and Affect: Mood normal.        Behavior: Behavior normal.        Thought Content: Thought content normal.        Judgment: Judgment normal.      Lab Results:  CBC    Component Value Date/Time   WBC 8.0 02/26/2023 0445   RBC 3.30 (L) 02/26/2023 0445   HGB 10.3 (L) 02/26/2023 0445   HGB 12.1 11/23/2021 1435   HGB 12.2 07/26/2021 1222   HCT 30.5 (L) 02/26/2023 0445   HCT 36.7 07/26/2021 1222   PLT 224 02/26/2023 0445   PLT 216 11/23/2021 1435   PLT 209 07/26/2021 1222   MCV 92.4 02/26/2023 0445   MCV 93 07/26/2021 1222   MCH 31.2 02/26/2023 0445   MCHC 33.8 02/26/2023 0445   RDW 14.6 02/26/2023 0445   RDW 12.5 07/26/2021 1222   LYMPHSABS 1.7 02/23/2023 1357   MONOABS 1.0 02/23/2023 1357   EOSABS 0.1 02/23/2023 1357   BASOSABS 0.0 02/23/2023 1357    BMET    Component Value Date/Time   NA 135 03/06/2023 1138   K 3.9 03/06/2023 1138   CL 95 (L) 03/06/2023 1138   CO2 26 03/06/2023 1138   GLUCOSE 118 (H) 03/06/2023 1138   GLUCOSE 107 (H) 02/28/2023 0543   BUN 20 03/06/2023 1138   CREATININE 1.30 (H) 03/06/2023 1138   CREATININE 1.52 (H) 11/23/2021 1435   CALCIUM  8.4 (L) 03/06/2023 1138   GFRNONAA 37 (L) 02/28/2023 0543   GFRNONAA 33 (L) 11/23/2021 1435   GFRAA 41 (L) 11/30/2019 1007    BNP    Component Value  Date/Time   BNP 300.2 (H) 02/26/2023 0445    ProBNP No results found for: PROBNP  Imaging: No results found.   Assessment & Plan:   1. Centrilobular emphysema (HCC) (Primary)  2. Mild persistent asthma without complication  3. Upper airway cough syndrome  4. Hiatal hernia  Assessment and Plan    Asthma and emphysema Asthma and emphysema are well-managed with Stiolto once daily. She has used the albuterol  rescue inhaler once in the last week for shortness of breath. Physical activity is limited due to leg issues, not respiratory symptoms. - Continue Stiolto once daily - Use albuterol  rescue inhaler 2 puffs every 4-6 hours as needed for dyspnea  Hiatal hernia with suspected GERD Large hiatal hernia potentially contributing to GERD symptoms, including dry cough and frequent burping. Famotidine prescribed to address potential GERD-related symptoms. - Prescribe famotidine (Pepcid) at bedtime - Consider increasing famotidine to twice daily if symptoms persist  Upper airway cough - Continue BD regimen for asthma/emphysema - Treat underlying reflux  - Continue Nasonex, Singulair , and Allegra - Use sugarless hard candies for xerostomia  Pulmonary nodule Pulmonary nodule has been stable since 2020 with no changes. No follow-up imaging required at this time. She is not interested in aggressive treatment due to age and stability of the nodule.  TB exposure - Quantiferon Gold test. No active TB symptoms reported.  Aortic valve replacement with residual regurgitation - Managed by cardiologist Dr. Ladona.  Follow-Up - Recall in October for follow-up appointment   Almarie LELON Ferrari, NP 07/15/2023

## 2023-08-31 ENCOUNTER — Other Ambulatory Visit: Payer: Self-pay | Admitting: Cardiology

## 2023-08-31 ENCOUNTER — Other Ambulatory Visit (HOSPITAL_BASED_OUTPATIENT_CLINIC_OR_DEPARTMENT_OTHER): Payer: Self-pay | Admitting: Pulmonary Disease

## 2023-09-03 ENCOUNTER — Other Ambulatory Visit: Payer: Self-pay | Admitting: Cardiology

## 2023-09-03 DIAGNOSIS — I5033 Acute on chronic diastolic (congestive) heart failure: Secondary | ICD-10-CM

## 2023-09-03 DIAGNOSIS — I2510 Atherosclerotic heart disease of native coronary artery without angina pectoris: Secondary | ICD-10-CM

## 2023-09-13 ENCOUNTER — Other Ambulatory Visit: Payer: Self-pay | Admitting: Cardiology

## 2023-10-10 ENCOUNTER — Other Ambulatory Visit: Payer: Self-pay | Admitting: Cardiology

## 2023-11-27 ENCOUNTER — Encounter (HOSPITAL_BASED_OUTPATIENT_CLINIC_OR_DEPARTMENT_OTHER): Payer: Self-pay | Admitting: Pulmonary Disease

## 2023-11-27 ENCOUNTER — Ambulatory Visit (HOSPITAL_BASED_OUTPATIENT_CLINIC_OR_DEPARTMENT_OTHER): Admitting: Pulmonary Disease

## 2023-11-27 VITALS — BP 119/63 | HR 74 | Ht 60.0 in | Wt 113.4 lb

## 2023-11-27 DIAGNOSIS — J4540 Moderate persistent asthma, uncomplicated: Secondary | ICD-10-CM

## 2023-11-27 DIAGNOSIS — J4531 Mild persistent asthma with (acute) exacerbation: Secondary | ICD-10-CM

## 2023-11-27 DIAGNOSIS — R0981 Nasal congestion: Secondary | ICD-10-CM

## 2023-11-27 MED ORDER — IPRATROPIUM BROMIDE 0.03 % NA SOLN: 2.0000 | Freq: Two times a day (BID) | NASAL | 5 refills | Status: DC

## 2023-11-27 MED ORDER — PREDNISONE 10 MG PO TABS: ORAL_TABLET | ORAL | 0 refills | Status: AC

## 2023-11-27 MED ORDER — AZITHROMYCIN 250 MG PO TABS: ORAL_TABLET | ORAL | 0 refills | Status: DC

## 2023-11-27 NOTE — Patient Instructions (Addendum)
 Acute rhinosinusitis COPD-asthma exacerbation --Azithromycin --Prednisone  taper  Asthma/Emphysema - persistent shortness of breath. Deconditioning is likely contributing --CONTINUE Stiolto 2.5/2.5 mcg TWO puffs ONCE a day. REFILL --CONTINUE Albuterol  as needed for shortness of breath or wheezing. OK to take before activity --CONTINUE Singulair  10 mg daily.   Nasal congestion --CONTINUE nasal rinses followed by nasal medication --START atrovent nasal spray. 1-2 spray per nostril in the morning and evening

## 2023-11-27 NOTE — Progress Notes (Signed)
 Subjective:   PATIENT ID: [name] [name] GENDER: female DOB: 27-Feb-1931, MRN: [id number]   HPI  Chief Complaint  Patient presents with   Emphysema    Follow up   Reason for Visit: Follow-up  [name] is a 89 year old female never smoker with emphysema, CAD s/p stent, AVS s/pt TAVR 01/2019, pulmonary nodules who presents for follow-up.  Synopsis:  She was referred to Pulmonary after being discharged from the hospital in 04/2020. Per review of discharge note she required 1-2L O2 in the ED. V/Q scan was negative for PE. While inpatient she was diuresed. Cardiology was consulted and plan for outpatient nuclear stress test. She was also referred to Pulmonary to consider PFT for evaluation of her emphysema. At time of discharge she did not require any oxygen. She has been on lasix  and has noticed weight gain of 10lbs post-discharge. This morning is 116lb, baseline is 113-115lb.  She reports childhood asthma and respiratory complications during pregnancy in her 20-30s. She was on a nebulizer 2-3 x daily in her 50-60s and needing steroids at least twice a year. However in her 70s-80s she has not had any issues or needing any medications for her asthma and this may have been related from removing environmental issues (older home, dust, tobacco farms, gardening)  She has had multiple sinus surgery x 4 including for nasal polyp removal. She was seen by ENT yesterday. She takes zyrtec and saline rinses. She is scheduled for stress test next week with [facility].  05/18/20 Since our last visit, she has been tolerating Spiriva  and feels that it is improving her shortness of breath. Her daughter is present with her daughter and wish to review PFT results.  10/02/20 Since our last visit she was seen by ENT and underwent sinus surgery (sinus fusion and intranasal polypectomy) in Aug 2022 with path returning with pseudomonas and fungus. She was treated with a course of amoxicillin . Last ENT  visit was 09/29/20 and started on nasal rinses and sprays. Not sure if this is effective yet since she recently started. She is on diuretics which she manages diligently with daily weight checks. She has been referred to a Nephrology. She tolerating the Spiriva  and feels it is helping her. Uses albuterol  on average once a week for shortness of breath.    12/28/20 Since our last visit she reports she is overall well controlled. Her shortness of breath is stable. She is able to perform activities around the house but she does not go upstairs. Improved intermittent cough. No wheezing. Uses albuterol  once a week.  08/17/21 She reports worsening shortness of breath and has been evaluated by Cardiology. Stress test negative. She reports worsening ability to take a deep breath. Has been taking her albuterol  daily more frequently. She is active at baseline. Occasional cough since her COVID infection at Christmas. Compliant with Spiriva . Daughter on the phone reports she isn't compliant with her lasix    10/22/21 Since our last visit she reports that she has some good days and some bad days for shortness of breath. Compliant with Stiolto which is improving cough. She is taking lasix  as needed and not currently having any increased weight or leg swelling. She is using albuterol  inhaler once a week but thinks she should be using it more. Not very active at baseline. Has to care for her husband and feels her plate is full.  04/08/22 Since our last visit she is compliant with her Stiolto with improved cough.  Will occur while laying down which she attributes to her hiatal hernia. She uses albuterol  three times a month. Denies wheezing.On days she does have shortness of breath, lasix  will resolve this. Her appetite is not as robust and drinking boost 3-4 days a week. Eats mainly fruits and vegetables. Avoids beef.   10/29/22 Since our last visit her husband has had some health issues and starting long term care assistance.  She also had a central retinal occlusion that has affected her vision. She has been fatigued lately. She has been feeling short of breath with activity. Her husband has been participating in physical therapy and she has noticed that she is unable to keep up with him. Occasional cough. No wheezing. She is compliant with Stiolto. Uses albuterol  1-2 times a week.  04/09/23 Since our last visit she has been seen in the ED in October 2024 and Jan 2025 for abdominal/hernia pain. She underwent hernia repair on 02/17/23 for right inguinal hernia and umbilical hernia. She required hospitalization 02/23/23-02/28/23 for diarrhea. Work-up neg stool studies. Diarrhea thought viral. Treated with IVF for hypovolemic hyponatremia. Pulmonary was consulted for shortness of breath however patient on room air and chest imaging with noncontributory small pleural effusion; dyspnea attributed to severe deconditioning. She has been seen by Cardiology and medically optimized with GDMT including daily diuresis. She continues to have shortness of breath with activity. Ambulating with a walker. Currently working with PT twice a week and OT twice a week at home. Reports dry cough associated with dry mouth. Denies wheezing. Rarely using albuterol . Performing breathing exercises daily.  11/27/23 She reports her baseline cough has worsened in the last two weeks. She has shortness of breath at rest and difficult to take a deep breath. No wheezing. No fevers or chills or body aches. Associated with congestion in the throat.  Minimal phlegm production with cough. Compliant with stiolto. Her husband recently passed and planning for funeral on the 15th.  Social History: Never smoker Her mother was diagnosed with tuberculosis Wood burning stove  Past Medical History:  Diagnosis Date   Anemia    Arthritis    Asthma    history of   CAD (coronary artery disease)    Cirrhosis (HCC)    CKD (chronic kidney disease)    Dyspnea    sometimes  sitting.lying and on exertion   Emphysema lung (HCC)    GERD (gastroesophageal reflux disease)    History of hiatal hernia    History of transcatheter aortic valve replacement (TAVR) 01/26/2019   (TAVR  23 mm Evolute Pro in aortic position 01/26/2019) .    Hyperlipidemia    Hypertension    Hypothyroidism    Lipoma    left arm   Nasal polyps    Pneumonia    Pulmonary nodule    noted on pre TAVR CT   Samter's triad    Severe aortic stenosis    TIA (transient ischemic attack)      Allergies  Allergen Reactions   Aspirin  Anaphylaxis   Bee Venom Anaphylaxis   Ceclor [Cefaclor] Anaphylaxis   Simvastatin  Other (See Comments)    Hair loss   Pneumococcal Vac Polyvalent Swelling    Other reaction(s): swelling and erythema   Statins Other (See Comments)    Elevated liver enzymes   Crestor  [Rosuvastatin ] Rash   Sulfa Antibiotics Rash     Outpatient Medications Prior to Visit  Medication Sig Dispense Refill   albuterol  (VENTOLIN  HFA) 108 (90 Base) MCG/ACT inhaler Inhale 2  puffs into the lungs every 6 (six) hours as needed for shortness of breath (use as needed or before activity for shortness of breath or whezzing). 8.5 each 6   Artificial Saliva (BIOTENE DRY MOUTH) LOZG Use as directed 1 Piece in the mouth or throat as needed (dry mouth).     atorvastatin  (LIPITOR) 10 MG tablet TAKE 1/2 TABLET BY MOUTH DAILY 45 tablet 2   benzonatate  (TESSALON ) 100 MG capsule Take 100 mg by mouth 3 (three) times daily as needed.     clopidogrel  (PLAVIX ) 75 MG tablet TAKE 1 TABLET BY MOUTH EVERY DAY 90 tablet 3   diphenhydrAMINE  (BENADRYL ) 25 mg capsule Take 25 mg by mouth every 6 (six) hours as needed for allergies.     EPINEPHrine  0.3 mg/0.3 mL IJ SOAJ injection Inject 0.3 mg into the muscle as needed for anaphylaxis (for bee stings - Patient has one on hand).     famotidine  (PEPCID ) 20 MG tablet Take 1 tablet (20 mg total) by mouth at bedtime. 30 tablet 5   fexofenadine (ALLEGRA) 180 MG tablet Take  180 mg by mouth daily as needed for allergies or rhinitis.     furosemide  (LASIX ) 20 MG tablet Take 1 tablet (20 mg total) by mouth daily. With potassium chloride  90 tablet 3   levothyroxine  (SYNTHROID ) 50 MCG tablet Take 50 mcg by mouth daily before breakfast. In am     losartan  (COZAAR ) 25 MG tablet Take 0.5 tablets (12.5 mg total) by mouth daily. 45 tablet 3   mometasone (NASONEX) 50 MCG/ACT nasal spray Place 2 sprays into the nose daily.     montelukast  (SINGULAIR ) 10 MG tablet TAKE 1 TABLET BY MOUTH EVERYDAY AT BEDTIME 90 tablet 3   Multiple Vitamin (MULTIVITAMIN WITH MINERALS) TABS tablet Take 1 tablet by mouth every other day.     oxyCODONE  (OXY IR/ROXICODONE ) 5 MG immediate release tablet Take 1 tablet (5 mg total) by mouth every 6 (six) hours as needed for moderate pain (pain score 4-6). 15 tablet 0   Peak Flow Meter DEVI 1 puff by Does not apply route in the morning and at bedtime. 1 each 0   Polyethyl Glycol-Propyl Glycol (SYSTANE) 0.4-0.3 % SOLN Place 1 drop into both eyes daily as needed (dry/irritated eyes.).     potassium chloride  (KLOR-CON ) 10 MEQ tablet Take 1 tablet (10 mEq total) by mouth daily. 90 tablet 3   propranolol  (INDERAL ) 10 MG tablet TAKE 1 TABLET BY MOUTH TWICE A DAY 180 tablet 2   sodium chloride  (OCEAN) 0.65 % nasal spray Place 1 spray into the nose every other day.     Spacer/Aero-Holding Chambers (AEROCHAMBER PLS FLOVU MTHPIECE) DEVI      Tiotropium Bromide-Olodaterol (STIOLTO RESPIMAT ) 2.5-2.5 MCG/ACT AERS Inhale 2 puffs into the lungs daily at 2 PM. 12 g 3   meclizine  (ANTIVERT ) 25 MG tablet Take 25 mg by mouth 3 (three) times daily as needed for dizziness. (Patient not taking: Reported on 11/27/2023)     No facility-administered medications prior to visit.    Review of Systems  Constitutional:  Negative for chills, diaphoresis, fever, malaise/fatigue and weight loss.  HENT:  Positive for congestion.   Respiratory:  Positive for cough and shortness of breath.  Negative for hemoptysis, sputum production and wheezing.   Cardiovascular:  Negative for chest pain, palpitations and leg swelling.     Objective:   Vitals:   11/27/23 1440  BP: 119/63  Pulse: 74  SpO2: 99%  Weight: 113 lb 6.4  oz (51.4 kg)  Height: 5' (1.524 m)   SpO2: 99 %  Physical Exam: General: Frail-appearing, no acute distress HENT: [place], AT Eyes: EOMI, no scleral icterus Respiratory: Clear to auscultation bilaterally.  No crackles, wheezing or rales Cardiovascular: RRR, -M/R/G, no JVD Extremities:-Edema,-tenderness Neuro: AAO x4, CNII-XII grossly intact Psych: Normal mood, normal affect   Data Reviewed:  Imaging: CT Chest 12/23/19 - S/p SVR. Tortuous thoracic aorta. Mild centrilobular emphysema. Large hiatal hernia. Stable pulmonary nodules including RML 7x5 mm and LLL ~4.5 mm, unchanged from prior imaging  CT Chest 12/25/20 - Unchanged 7 mm nodule, stable subcentimeter nodules. Large hiatal hernia.   CXR 10/22/21 - No acute infiltrate, effusion or edema. Large hiatal hernia  CT CAP 02/23/23 - Visualized parenchyma with new small pleural effusion and atelectasis on left side. RUL with tree in bud.  CXR 04/09/23 - On my read, no infiltrate effusion or edema. Large hiatal hernia  PFT: 05/05/20 FVC 1.7 (91%) FEV1 1.26 (93%) Ratio 73  TLC 84% DLCO 43% Interpretation:  Normal spirometry. No obstructive or restrictive defect  Labs: WBC 05/04/20 9.9 Absolute eos 05/04/20 200  Assessment & Plan:   Discussion: 89 year old female with asthma, emphysema on CT who presents for follow-up. Reviewed hospital course and chest imaging. CXR today without effusion seen. She has continued dyspnea. Normal O2 sats. Suspect deconditioning is primarily driving her dyspnea. No changes to bronchodilators. Encouraged PT and activity as tolerated.  89 year old female with asthma, emphysema on CT who presents for follow-up. Currently in asthma exacerbation secondary to acute rhinosinusitis.  Exacerbated by nasal congestion. Management as noted below.  Prior Inhalers Spiriva  - good except for cough Stiolto - improved cough control  Acute rhinosinusitis COPD asthma exacerbation --Azithromycin --Prednisone  taper  Asthma/Emphysema - persistent shortness of breath. Deconditioning is likely contributing --CONTINUE Stiolto 2.5/2.5 mcg TWO puffs ONCE a day. REFILL --CONTINUE Albuterol  as needed for shortness of breath or wheezing. OK to take before activity --CONTINUE Singulair  10 mg daily.   Nasal congestion --CONTINUE nasal rinses followed by nasal medication --START atrovent nasal spray. 1-2 spray per nostril in the morning and evening  History of TB exposure --QF-TB neg  RML lung nodule Stable subcentimeter nodules --Stable 7 mm nodule since 2020 --No further follow-up needed. Patient not interested in aggressive management  Health Maintenance Immunization History  Administered Date(s) Administered   Fluad Quad(high Dose 65+) 11/05/2021   Fluad Trivalent(High Dose 65+) 10/17/2023   INFLUENZA, HIGH DOSE SEASONAL PF 11/08/2009, 10/18/2011, 10/10/2014, 10/27/2015, 11/05/2017, 10/08/2022   Influenza Split 10/19/2012, 10/07/2016, 10/25/2019   Influenza Whole 10/27/2015   Influenza-Unspecified 11/08/2009, 10/18/2011, 10/10/2014, 10/27/2015, 11/05/2017   Moderna Sars-Covid-2 Vaccination 02/25/2019   PFIZER Comirnaty(Gray Top)Covid-19 Tri-Sucrose Vaccine 10/12/2021   PFIZER(Purple Top)SARS-COV-2 Vaccination 02/25/2019, 03/24/2019, 11/08/2019, 06/16/2020   Pfizer Covid-19 Vaccine Bivalent Booster 87yrs & up 10/05/2020, 10/09/2021, 09/17/2022   Pneumococcal Conjugate-13 09/21/2014, 11/24/2015   Pneumococcal Polysaccharide-23 04/29/1998, 01/21/2006, 06/05/2006, 05/10/2013   Td 06/22/2003   Td (Adult) 06/22/2003   Tdap 09/09/2013, 10/27/2015   Unspecified SARS-COV-2 Vaccination 10/10/2023   Zoster, Live 06/05/2006, 07/22/2017, 10/08/2017   CT Lung Screen - not  indicated  No orders of the defined types were placed in this encounter.  Meds ordered this encounter  Medications   predniSONE  (DELTASONE ) 10 MG tablet    Sig: Take 4 tablets (40 mg total) by mouth daily with breakfast for 2 days, THEN 3 tablets (30 mg total) daily with breakfast for 2 days, THEN 2 tablets (20 mg  total) daily with breakfast for 2 days, THEN 1 tablet (10 mg total) daily with breakfast for 2 days.    Dispense:  20 tablet    Refill:  0   azithromycin (ZITHROMAX) 250 MG tablet    Sig: Take two tablets on day 1, then one tablet daily on day 2-5.    Dispense:  6 tablet    Refill:  0   ipratropium (ATROVENT) 0.03 % nasal spray    Sig: Place 2 sprays into both nostrils every 12 (twelve) hours.    Dispense:  30 mL    Refill:  5   Return in about 2 months (around 01/27/2024).  I have spent a total time of 35-minutes on the day of the appointment including chart review, data review, collecting history, coordinating care and discussing medical diagnosis and plan with the patient/family. Past medical history, allergies, medications were reviewed. Pertinent imaging, labs and tests included in this note have been reviewed and interpreted independently by me.   Lakely Elmendorf Slater Staff, MD [facility] 11/27/2023

## 2024-01-08 ENCOUNTER — Other Ambulatory Visit (HOSPITAL_BASED_OUTPATIENT_CLINIC_OR_DEPARTMENT_OTHER): Payer: Self-pay | Admitting: Primary Care

## 2024-01-12 ENCOUNTER — Telehealth: Payer: Self-pay | Admitting: Cardiology

## 2024-01-12 DIAGNOSIS — E7800 Pure hypercholesterolemia, unspecified: Secondary | ICD-10-CM

## 2024-01-12 DIAGNOSIS — I2510 Atherosclerotic heart disease of native coronary artery without angina pectoris: Secondary | ICD-10-CM

## 2024-01-12 MED ORDER — ATORVASTATIN CALCIUM 10 MG PO TABS: 5.0000 mg | ORAL_TABLET | Freq: Every day | ORAL | 0 refills | Status: DC

## 2024-01-12 NOTE — Telephone Encounter (Signed)
 Pt scheduled 02/02/24, refill sent.

## 2024-01-12 NOTE — Telephone Encounter (Signed)
" °*  STAT* If patient is at the pharmacy, call can be transferred to refill team.   1. Which medications need to be refilled? (please list name of each medication and dose if known)   atorvastatin  (LIPITOR) 10 MG tablet   2. Would you like to learn more about the convenience, safety, & potential cost savings by using the [facility]?   3. Are you open to using the [facility] (Type [facility]. ).  4. Which pharmacy/location (including street and city if local pharmacy) is medication to be sent to?  [id number] - [place], [place] - [address]   5. Do they need a 30 day or 90 day supply?   30 day  Patient stated she is completely out of this medication.  Patient has appointment scheduled with Dr. Ladona on 02/02/24. "

## 2024-01-21 ENCOUNTER — Telehealth: Payer: Self-pay | Admitting: Cardiology

## 2024-01-21 NOTE — Telephone Encounter (Signed)
  Daughter is returning call 

## 2024-01-21 NOTE — Telephone Encounter (Signed)
 Returned patient/dtr's ([facility]) call. 2 identifiers used. Patient and dtr Amy's concern is that patient has been gaining weight over the last month. They note that on 12/02/23 patient's weight was 112. On 12/16/23 her weight was 113.  On 12/25/23 it was 117 and today it is 118/8. Patient denies SOB or chest pain, but dtr endorses that legs are a little swollen. Patient states she feels a little fatigued.   On review of medications, patient states she has been taking her lasix  3 times a week. Orders in epic currently have lasix  ordered at 20 mg daily. Dtr states she will make sure patient takes lasix  as ordered over the weekend and will call our office if there is no improvement on Monday. Offered APP appointment on 01/26/23 which patient and daughter accept.  Discussed ED precautions, patient and daughter verbalize understanding to call 911 or go to ED if patient becomes SOB.

## 2024-01-21 NOTE — Telephone Encounter (Signed)
 Returned patient's call, no answer. Left voice mail explaining I was returning her call regarding her symptoms, asked patient to call our office.

## 2024-01-21 NOTE — Telephone Encounter (Signed)
 Pt c/o swelling/edema: STAT if pt has developed SOB within 24 hours  If swelling, where is the swelling located? Legs/feet  How much weight have you gained and in what time span? 5 pounds in 3 days  Have you gained 2 pounds in a day or 5 pounds in a week? Yes   Do you have a log of your daily weights (if so, list)? N/a  Are you currently taking a fluid pill? no  Are you currently SOB? no  Have you traveled recently in a car or plane for an extended period of time? No Please advise.

## 2024-01-23 NOTE — Progress Notes (Unsigned)
 " Cardiology Office Note:    Date:  01/26/2024   ID:  [name] [name], DOB July 15, 1931, MRN [id number]  PCP:  Vernon Velna SAUNDERS, MD   [facility] Providers Cardiologist:  Gordy Bergamo, MD Structural Heart:  Lonni Cash, MD    Referring MD: Vernon Velna SAUNDERS, MD   Chief Complaint  Patient presents with   Follow-up    LE edema    History of Present Illness:    [name] [name] is a 89 y.o. female with a hx of benign positional vertigo, chronic dyspnea, cirrhosis with portal hypertension, CKD 3b, chronic back pain, HTN, HLD, CAD s/p DES-LAD and POBA-D1 on 01/05/2019, residual high grade stenosis of ramus. Aortic stenosis now s/p TAVR 01/26/2019.   She had a retinal artery occlusion 06/2022.   Hospitalized Feb 2025 with hyponatremia, treated with IVF felt to be hypovolemic hyponatremia in the setting of diarrhea.   She was last seen by Dr. Bergamo 04/01/23 and reported rash and fatigue, possible due to plavix . Dr. Bergamo opted to continue plavix .   Family called our office on 01/21/24 with increased swelling possibly due to incorrect lasix  dosing.   Her daughter is on the phone with questions regarding a case study of lipitor and cold extremities. We discussed the need for a statin given stent and TAVR. We also discussed changing statins to another medication, but they relayed this is the third one they have tried due to NASH and elevated LFTs. I suggested a statin holiday to see if this improves her symptoms - would stop for at least 6 weeks.   She started taking lasix  daily with potassium supplementation 1 week ago and has lost 2 lbs on her home scales. She states she feels better on this regimen. She is due to see nephrology next week.   Given that this is the first tie she has needed to stay on lasix  daily and has only had a 2 lbs weight loss, we will obtain repeat echo to check valve.    Past Medical History:  Diagnosis Date   Anemia    Arthritis    Asthma    history of    CAD (coronary artery disease)    Cirrhosis (HCC)    CKD (chronic kidney disease)    Dyspnea    sometimes sitting.lying and on exertion   Emphysema lung (HCC)    GERD (gastroesophageal reflux disease)    History of hiatal hernia    History of transcatheter aortic valve replacement (TAVR) 01/26/2019   (TAVR  23 mm Evolute Pro in aortic position 01/26/2019) .    Hyperlipidemia    Hypertension    Hypothyroidism    Lipoma    left arm   Nasal polyps    Pneumonia    Pulmonary nodule    noted on pre TAVR CT   Samter's triad    Severe aortic stenosis    TIA (transient ischemic attack)     Past Surgical History:  Procedure Laterality Date   CHOLECYSTECTOMY  02/27/2004   CORONARY ATHERECTOMY N/A 01/05/2019   Procedure: CORONARY ATHERECTOMY;  Surgeon: Bergamo Gordy, MD;  Location: [facility] INVASIVE CV LAB;  Service: Cardiovascular;  Laterality: N/A;   CORONARY BALLOON ANGIOPLASTY N/A 01/05/2019   Procedure: CORONARY BALLOON ANGIOPLASTY;  Surgeon: Bergamo Gordy, MD;  Location: [facility] INVASIVE CV LAB;  Service: Cardiovascular;  Laterality: N/A;   CORONARY STENT INTERVENTION N/A 01/05/2019   Procedure: CORONARY STENT INTERVENTION;  Surgeon: Bergamo Gordy, MD;  Location: [facility] INVASIVE CV  LAB;  Service: Cardiovascular;  Laterality: N/A;   CORONARY STENT INTERVENTION  01/05/2019   INCONTINENCE SURGERY  09/12/2009   INGUINAL HERNIA REPAIR Right 02/17/2023   Procedure: LAPAROSCOPIC RIGHT INGUINAL HERNIA REPAIR WITH MESH;  Surgeon: Kinsinger, Herlene Righter, MD;  Location: [facility] ORS;  Service: General;  Laterality: Right;   NASAL SINUS SURGERY     several     ORIF ANKLE FRACTURE Right 09/11/2013   Procedure: OPEN REDUCTION INTERNAL FIXATION (ORIF) ANKLE FRACTURE;  Surgeon: Elspeth JONELLE Her, MD;  Location: [facility] ORS;  Service: Orthopedics;  Laterality: Right;   RIGHT/LEFT HEART CATH AND CORONARY ANGIOGRAPHY N/A 12/15/2018   Procedure: RIGHT/LEFT HEART CATH AND CORONARY ANGIOGRAPHY;  Surgeon: Ladona Heinz, MD;  Location: [facility]  INVASIVE CV LAB;  Service: Cardiovascular;  Laterality: N/A;   ROTATOR CUFF REPAIR  09/27/2003   SINUS ENDO WITH FUSION Bilateral 09/08/2020   Procedure: Bilateral Revision of Endoscopic Sinus Surgery with Fusion and Intranasal Polypectomy;  Surgeon: Mable Lenis, MD;  Location: [facility] OR;  Service: ENT;  Laterality: Bilateral;   TRANSCATHETER AORTIC VALVE REPLACEMENT, TRANSFEMORAL  01/26/2019   TRANSCATHETER AORTIC VALVE REPLACEMENT, TRANSFEMORAL N/A 01/26/2019   Procedure: TRANSCATHETER AORTIC VALVE REPLACEMENT, TRANSFEMORAL;  Surgeon: Verlin Lonni BIRCH, MD;  Location: [facility] OR;  Service: Open Heart Surgery;  Laterality: N/A;   TRIGGER FINGER RELEASE  10/29/2006   UMBILICAL HERNIA REPAIR N/A 02/17/2023   Procedure: PRIMARY UMBILICAL HERNIA REPAIR;  Surgeon: Stevie, Herlene Righter, MD;  Location: [facility] ORS;  Service: General;  Laterality: N/A;    Current Medications: Active Medications[1]   Allergies:   Aspirin , Bee venom, Ceclor [cefaclor], Simvastatin , Pneumococcal vac polyvalent, Statins, Crestor  [rosuvastatin ], and Sulfa antibiotics   Social History   Socioeconomic History   Marital status: Married    Spouse name: Not on file   Number of children: 3   Years of education: Not on file   Highest education level: Not on file  Occupational History   Not on file  Tobacco Use   Smoking status: Never    Passive exposure: Past   Smokeless tobacco: Never  Vaping Use   Vaping status: Never Used  Substance and Sexual Activity   Alcohol  use: No   Drug use: No   Sexual activity: Not on file  Other Topics Concern   Not on file  Social History Narrative   Not on file   Social Drivers of Health   Tobacco Use: Low Risk (11/27/2023)   Patient History    Smoking Tobacco Use: Never    Smokeless Tobacco Use: Never    Passive Exposure: Past  Financial Resource Strain: Not on file  Food Insecurity: No Food Insecurity (02/25/2023)   Hunger Vital Sign    Worried About Running Out of Food  in the Last Year: Never true    Ran Out of Food in the Last Year: Never true  Transportation Needs: Unmet Transportation Needs (02/25/2023)   PRAPARE - Administrator, Civil Service (Medical): Yes    Lack of Transportation (Non-Medical): Yes  Physical Activity: Not on file  Stress: Not on file  Social Connections: Unknown (02/25/2023)   Social Connection and Isolation Panel    Frequency of Communication with Friends and Family: More than three times a week    Frequency of Social Gatherings with Friends and Family: Three times a week    Attends Religious Services: More than 4 times per year    Active Member of Clubs or Organizations: No    Attends Club  or Organization Meetings: Never    Marital Status: Patient declined  Depression (PHQ2-9): Not on file  Alcohol  Screen: Not on file  Housing: Low Risk (02/25/2023)   Housing Stability Vital Sign    Unable to Pay for Housing in the Last Year: No    Number of Times Moved in the Last Year: 0    Homeless in the Last Year: No  Utilities: Not At Risk (02/25/2023)   AHC Utilities    Threatened with loss of utilities: No  Health Literacy: Not on file     Family History: The patient's family history includes Asthma in her maternal grandfather; Brain cancer in her daughter; Breast cancer in her daughter; Cancer in her maternal grandmother; Dementia in her sister; Heart attack in her brother; Heart disease in her brother; Other in her father; Sarcoidosis in her sister; Tuberculosis in her mother.  ROS:   Please see the history of present illness.     All other systems reviewed and are negative.  EKGs/Labs/Other Studies Reviewed:    The following studies were reviewed today:  EKG Interpretation Date/Time:  Monday January 26 2024 14:39:07 EST Ventricular Rate:  72 PR Interval:  162 QRS Duration:  116 QT Interval:  412 QTC Calculation: 451 R Axis:   -53  Text Interpretation: Normal sinus rhythm Left axis deviation Minimal voltage  criteria for LVH, may be normal variant ( Cornell product ) Inferior infarct , age undetermined Anterolateral infarct , age undetermined When compared with ECG of 23-Feb-2023 14:35, PREVIOUS ECG IS PRESENT Confirmed by Madie Slough ([id number]) on 01/26/2024 2:58:02 PM    Recent Labs: 02/23/2023: ALT 33 02/24/2023: TSH 5.712 02/26/2023: B Natriuretic Peptide 300.2; Hemoglobin 10.3; Magnesium  2.6; Platelets 224 03/06/2023: BUN 20; Creatinine, Ser 1.30; Potassium 3.9; Sodium 135  Recent Lipid Panel    Component Value Date/Time   CHOL 175 09/23/2022 0224   CHOL 163 11/30/2019 1007   TRIG 65 09/23/2022 0224   HDL 63 09/23/2022 0224   HDL 62 11/30/2019 1007   CHOLHDL 2.8 09/23/2022 0224   VLDL 13 09/23/2022 0224   LDLCALC 99 09/23/2022 0224   LDLCALC 85 11/30/2019 1007     Risk Assessment/Calculations:                Physical Exam:    VS:  BP (!) 100/58   Pulse 72   Ht 5' (1.524 m)   Wt 119 lb 6.4 oz (54.2 kg)   SpO2 98%   BMI 23.32 kg/m     Wt Readings from Last 3 Encounters:  01/26/24 119 lb 6.4 oz (54.2 kg)  11/27/23 113 lb 6.4 oz (51.4 kg)  07/15/23 112 lb (50.8 kg)     GEN:  Well nourished, well developed in no acute distress HEENT: Normal NECK: No JVD; No carotid bruits LYMPHATICS: No lymphadenopathy CARDIAC: RRR, systolic murmur 3/6 at LSB RESPIRATORY:  Clear to auscultation without rales, wheezing or rhonchi  ABDOMEN: Soft, non-tender, non-distended MUSCULOSKELETAL:  No edema, extremities warm, intact pedal pulses SKIN: Warm and dry NEUROLOGIC:  Alert and oriented x 3 PSYCHIATRIC:  Normal affect   ASSESSMENT:    1. S/P TAVR (transcatheter aortic valve replacement)   2. Acute on chronic heart failure with preserved ejection fraction (HCC)   3. Coronary artery disease involving native coronary artery of native heart without angina pectoris   4. Chronic heart failure with preserved ejection fraction (HCC)   5. Other cirrhosis of liver (HCC)   6. Bilateral lower  extremity edema  PLAN:    In order of problems listed above:  HFpEF CKD 3b Liver cirrhosis Lower extremity swelling Discoloration of lower extremities - currently on 20 mg daily lasix  along with 20 mEq potassium - this is an increase for her from three times weekly - continue 10 mg propranolol  BID, 12.5 mg losartan  - I requested a BMP today, she relays that she is having labs drawn at another [facility] for nephrology - discoloration likely related to venous insufficiency - she has strong pedal pulses, I do not suspect arterial occlusion, extremities are warm to my touch today   AS s/p TAVR 2021 - last echo was 04/2023 - murmur on exam - will check a limited echo with recent fluid issues and need for daily lasix    CAD s/p PCI/DES-LAD, POBA-D1 12/2018 - no chest pain - on plavix , allergic to ASA   Subjective feeling of cold extremities - she reports this is due to lipitor based on research - we discussed that given her stent and TAVR, we would prefer to have her on a statin, but not if it is limiting quality of life - I suggested a statin holiday of at least 6 weeks to see if this improves symptoms.       Follow up with Dr. Sharman next week.       Medication Adjustments/Labs and Tests Ordered: Current medicines are reviewed at length with the patient today.  Concerns regarding medicines are outlined above.  Orders Placed This Encounter  Procedures   EKG 12-Lead   ECHOCARDIOGRAM COMPLETE   No orders of the defined types were placed in this encounter.   Patient Instructions  Medication Instructions:  Stop Atorvastatin  (Lipitor) for six weeks *If you need a refill on your cardiac medications before your next appointment, please call your pharmacy*   Testing/Procedures: Your physician has requested that you have an echocardiogram. Echocardiography is a painless test that uses sound waves to create images of your heart. It provides your doctor with information about  the size and shape of your heart and how well your hearts chambers and valves are working. This procedure takes approximately one hour. There are no restrictions for this procedure. Please do NOT wear cologne, perfume, aftershave, or lotions (deodorant is allowed). Please arrive 15 minutes prior to your appointment time.  Please note: We ask at that you not bring children with you during ultrasound (echo/ vascular) testing. Due to room size and safety concerns, children are not allowed in the ultrasound rooms during exams. Our front office staff cannot provide observation of children in our lobby area while testing is being conducted. An adult accompanying a patient to their appointment will only be allowed in the ultrasound room at the discretion of the ultrasound technician under special circumstances. We apologize for any inconvenience.   Follow-Up: Keep upcoming appointments   Signed, Jon Nat Hails, [place]  01/26/2024 4:09 PM    [facility]     [1]  Current Meds  Medication Sig   albuterol  (VENTOLIN  HFA) 108 (90 Base) MCG/ACT inhaler Inhale 2 puffs into the lungs every 6 (six) hours as needed for shortness of breath (use as needed or before activity for shortness of breath or whezzing).   Artificial Saliva (BIOTENE DRY MOUTH) LOZG Use as directed 1 Piece in the mouth or throat as needed (dry mouth).   atorvastatin  (LIPITOR) 10 MG tablet Take 0.5 tablets (5 mg total) by mouth daily.   clopidogrel  (PLAVIX ) 75 MG tablet TAKE 1 TABLET BY  MOUTH EVERY DAY   diphenhydrAMINE  (BENADRYL ) 25 mg capsule Take 25 mg by mouth every 6 (six) hours as needed for allergies.   EPINEPHrine  0.3 mg/0.3 mL IJ SOAJ injection Inject 0.3 mg into the muscle as needed for anaphylaxis (for bee stings - Patient has one on hand).   famotidine  (PEPCID ) 20 MG tablet TAKE 1 TABLET BY MOUTH EVERYDAY AT BEDTIME   fexofenadine (ALLEGRA) 180 MG tablet Take 180 mg by mouth daily as needed for allergies or rhinitis.    furosemide  (LASIX ) 20 MG tablet Take 1 tablet (20 mg total) by mouth daily. With potassium chloride    ipratropium (ATROVENT ) 0.03 % nasal spray Place 2 sprays into both nostrils every 12 (twelve) hours.   losartan  (COZAAR ) 25 MG tablet Take 0.5 tablets (12.5 mg total) by mouth daily.   mometasone (NASONEX) 50 MCG/ACT nasal spray Place 2 sprays into the nose daily.   montelukast  (SINGULAIR ) 10 MG tablet TAKE 1 TABLET BY MOUTH EVERYDAY AT BEDTIME   Multiple Vitamin (MULTIVITAMIN WITH MINERALS) TABS tablet Take 1 tablet by mouth every other day.   oxyCODONE  (OXY IR/ROXICODONE ) 5 MG immediate release tablet Take 1 tablet (5 mg total) by mouth every 6 (six) hours as needed for moderate pain (pain score 4-6).   Peak Flow Meter DEVI 1 puff by Does not apply route in the morning and at bedtime.   Polyethyl Glycol-Propyl Glycol (SYSTANE) 0.4-0.3 % SOLN Place 1 drop into both eyes daily as needed (dry/irritated eyes.).   potassium chloride  (KLOR-CON ) 10 MEQ tablet Take 1 tablet (10 mEq total) by mouth daily.   propranolol  (INDERAL ) 10 MG tablet TAKE 1 TABLET BY MOUTH TWICE A DAY   sodium chloride  (OCEAN) 0.65 % nasal spray Place 1 spray into the nose every other day.   Spacer/Aero-Holding Chambers (AEROCHAMBER PLS FLOVU MTHPIECE) DEVI    Tiotropium Bromide-Olodaterol (STIOLTO RESPIMAT ) 2.5-2.5 MCG/ACT AERS Inhale 2 puffs into the lungs daily at 2 PM.   [DISCONTINUED] levothyroxine  (SYNTHROID ) 50 MCG tablet Take 50 mcg by mouth daily before breakfast. In am   "

## 2024-01-26 ENCOUNTER — Encounter: Payer: Self-pay | Admitting: Physician Assistant

## 2024-01-26 ENCOUNTER — Ambulatory Visit: Attending: Physician Assistant | Admitting: Physician Assistant

## 2024-01-26 VITALS — BP 100/58 | HR 72 | Ht 60.0 in | Wt 119.4 lb

## 2024-01-26 DIAGNOSIS — I5033 Acute on chronic diastolic (congestive) heart failure: Secondary | ICD-10-CM

## 2024-01-26 DIAGNOSIS — R600 Localized edema: Secondary | ICD-10-CM | POA: Diagnosis not present

## 2024-01-26 DIAGNOSIS — Z952 Presence of prosthetic heart valve: Secondary | ICD-10-CM | POA: Diagnosis not present

## 2024-01-26 DIAGNOSIS — K7469 Other cirrhosis of liver: Secondary | ICD-10-CM

## 2024-01-26 DIAGNOSIS — I2510 Atherosclerotic heart disease of native coronary artery without angina pectoris: Secondary | ICD-10-CM

## 2024-01-26 DIAGNOSIS — I5032 Chronic diastolic (congestive) heart failure: Secondary | ICD-10-CM

## 2024-01-26 NOTE — Patient Instructions (Signed)
 Medication Instructions:  Stop Atorvastatin  (Lipitor) for six weeks *If you need a refill on your cardiac medications before your next appointment, please call your pharmacy*   Testing/Procedures: Your physician has requested that you have an echocardiogram. Echocardiography is a painless test that uses sound waves to create images of your heart. It provides your doctor with information about the size and shape of your heart and how well your hearts chambers and valves are working. This procedure takes approximately one hour. There are no restrictions for this procedure. Please do NOT wear cologne, perfume, aftershave, or lotions (deodorant is allowed). Please arrive 15 minutes prior to your appointment time.  Please note: We ask at that you not bring children with you during ultrasound (echo/ vascular) testing. Due to room size and safety concerns, children are not allowed in the ultrasound rooms during exams. Our front office staff cannot provide observation of children in our lobby area while testing is being conducted. An adult accompanying a patient to their appointment will only be allowed in the ultrasound room at the discretion of the ultrasound technician under special circumstances. We apologize for any inconvenience.   Follow-Up: Keep upcoming appointments

## 2024-01-27 ENCOUNTER — Encounter (HOSPITAL_BASED_OUTPATIENT_CLINIC_OR_DEPARTMENT_OTHER): Payer: Self-pay | Admitting: Pulmonary Disease

## 2024-01-27 ENCOUNTER — Ambulatory Visit (HOSPITAL_BASED_OUTPATIENT_CLINIC_OR_DEPARTMENT_OTHER): Admitting: Pulmonary Disease

## 2024-01-27 VITALS — BP 100/60 | HR 64 | Ht 60.0 in | Wt 118.4 lb

## 2024-01-27 DIAGNOSIS — R911 Solitary pulmonary nodule: Secondary | ICD-10-CM

## 2024-01-27 DIAGNOSIS — R0981 Nasal congestion: Secondary | ICD-10-CM | POA: Diagnosis not present

## 2024-01-27 DIAGNOSIS — J4489 Other specified chronic obstructive pulmonary disease: Secondary | ICD-10-CM

## 2024-01-27 DIAGNOSIS — J45909 Unspecified asthma, uncomplicated: Secondary | ICD-10-CM | POA: Diagnosis not present

## 2024-01-27 DIAGNOSIS — J439 Emphysema, unspecified: Secondary | ICD-10-CM

## 2024-01-27 DIAGNOSIS — Z201 Contact with and (suspected) exposure to tuberculosis: Secondary | ICD-10-CM

## 2024-01-27 MED ORDER — STIOLTO RESPIMAT 2.5-2.5 MCG/ACT IN AERS: 2.0000 | INHALATION_SPRAY | Freq: Every day | RESPIRATORY_TRACT | 3 refills | Status: AC

## 2024-01-27 NOTE — Progress Notes (Signed)
 "    Subjective:   PATIENT ID: [name] [name] GENDER: female DOB: [birth year], MRN: [id number]   HPI  Chief Complaint  Patient presents with   Asthma   Reason for Visit: Follow-up  [name] is a 89 year old female never smoker with emphysema, CAD s/p stent, AVS s/pt TAVR 01/2019, pulmonary nodules who presents for follow-up.  Synopsis:  She was referred to Pulmonary after being discharged from the hospital in 04/2020. Per review of discharge note she required 1-2L O2 in the ED. V/Q scan was negative for PE. While inpatient she was diuresed. Cardiology was consulted and plan for outpatient nuclear stress test. She was also referred to Pulmonary to consider PFT for evaluation of her emphysema. At time of discharge she did not require any oxygen. She has been on lasix  and has noticed weight gain of 10lbs post-discharge. This morning is 116lb, baseline is 113-115lb.  She reports childhood asthma and respiratory complications during pregnancy in her 20-30s. She was on a nebulizer 2-3 x daily in her 50-60s and needing steroids at least twice a year. However in her 70s-80s she has not had any issues or needing any medications for her asthma and this may have been related from removing environmental issues (older home, dust, tobacco farms, gardening)  She has had multiple sinus surgery x 4 including for nasal polyp removal. She was seen by ENT yesterday. She takes zyrtec and saline rinses. She is scheduled for stress test next week with [facility].  05/18/20 Since our last visit, she has been tolerating Spiriva  and feels that it is improving her shortness of breath. Her daughter is present with her daughter and wish to review PFT results.  10/02/20 Since our last visit she was seen by ENT and underwent sinus surgery (sinus fusion and intranasal polypectomy) in Aug 2022 with path returning with pseudomonas and fungus. She was treated with a course of amoxicillin . Last ENT visit was 09/29/20  and started on nasal rinses and sprays. Not sure if this is effective yet since she recently started. She is on diuretics which she manages diligently with daily weight checks. She has been referred to a Nephrology. She tolerating the Spiriva  and feels it is helping her. Uses albuterol  on average once a week for shortness of breath.    12/28/20 Since our last visit she reports she is overall well controlled. Her shortness of breath is stable. She is able to perform activities around the house but she does not go upstairs. Improved intermittent cough. No wheezing. Uses albuterol  once a week.  08/17/21 She reports worsening shortness of breath and has been evaluated by Cardiology. Stress test negative. She reports worsening ability to take a deep breath. Has been taking her albuterol  daily more frequently. She is active at baseline. Occasional cough since her COVID infection at Christmas. Compliant with Spiriva . Daughter on the phone reports she isn't compliant with her lasix    10/22/21 Since our last visit she reports that she has some good days and some bad days for shortness of breath. Compliant with Stiolto which is improving cough. She is taking lasix  as needed and not currently having any increased weight or leg swelling. She is using albuterol  inhaler once a week but thinks she should be using it more. Not very active at baseline. Has to care for her husband and feels her plate is full.  04/08/22 Since our last visit she is compliant with her Stiolto with improved cough. Will occur while laying  down which she attributes to her hiatal hernia. She uses albuterol  three times a month. Denies wheezing.On days she does have shortness of breath, lasix  will resolve this. Her appetite is not as robust and drinking boost 3-4 days a week. Eats mainly fruits and vegetables. Avoids beef.   10/29/22 Since our last visit her husband has had some health issues and starting long term care assistance. She also had a  central retinal occlusion that has affected her vision. She has been fatigued lately. She has been feeling short of breath with activity. Her husband has been participating in physical therapy and she has noticed that she is unable to keep up with him. Occasional cough. No wheezing. She is compliant with Stiolto. Uses albuterol  1-2 times a week.  04/09/23 Since our last visit she has been seen in the ED in October 2024 and Jan 2025 for abdominal/hernia pain. She underwent hernia repair on 02/17/23 for right inguinal hernia and umbilical hernia. She required hospitalization 02/23/23-02/28/23 for diarrhea. Work-up neg stool studies. Diarrhea thought viral. Treated with IVF for hypovolemic hyponatremia. Pulmonary was consulted for shortness of breath however patient on room air and chest imaging with noncontributory small pleural effusion; dyspnea attributed to severe deconditioning. She has been seen by Cardiology and medically optimized with GDMT including daily diuresis. She continues to have shortness of breath with activity. Ambulating with a walker. Currently working with PT twice a week and OT twice a week at home. Reports dry cough associated with dry mouth. Denies wheezing. Rarely using albuterol . Performing breathing exercises daily.  11/27/23 She reports her baseline cough has worsened in the last two weeks. She has shortness of breath at rest and difficult to take a deep breath. No wheezing. No fevers or chills or body aches. Associated with congestion in the throat.  Minimal phlegm production with cough. Compliant with stiolto. Her husband recently passed and planning for funeral on the 15th.  01/27/24 On our last visit she was treated for COPD-asthma exacerbation. Since then she has improved. Stable shortness of breath. She did not care for prescribed nasal spray. She prefers nasal saline spray once a day and helps with congestion. Will sometimes follow-up with sinus cleanse. Has occasional dry cough and  takes sugar free cough drops. Compliant with Stiolto. Expresses concern about new flu strain.  Social History: Never smoker Her mother was diagnosed with tuberculosis Wood burning stove  Past Medical History:  Diagnosis Date   Anemia    Arthritis    Asthma    history of   CAD (coronary artery disease)    Cirrhosis (HCC)    CKD (chronic kidney disease)    Dyspnea    sometimes sitting.lying and on exertion   Emphysema lung (HCC)    GERD (gastroesophageal reflux disease)    History of hiatal hernia    History of transcatheter aortic valve replacement (TAVR) 01/26/2019   (TAVR  23 mm Evolute Pro in aortic position 01/26/2019) .    Hyperlipidemia    Hypertension    Hypothyroidism    Lipoma    left arm   Nasal polyps    Pneumonia    Pulmonary nodule    noted on pre TAVR CT   Samter's triad    Severe aortic stenosis    TIA (transient ischemic attack)      Allergies  Allergen Reactions   Aspirin  Anaphylaxis   Bee Venom Anaphylaxis   Ceclor [Cefaclor] Anaphylaxis   Simvastatin  Other (See Comments)    Hair loss  Pneumococcal Vac Polyvalent Swelling    Other reaction(s): swelling and erythema   Statins Other (See Comments)    Elevated liver enzymes   Crestor  [Rosuvastatin ] Rash   Sulfa Antibiotics Rash     Outpatient Medications Prior to Visit  Medication Sig Dispense Refill   albuterol  (VENTOLIN  HFA) 108 (90 Base) MCG/ACT inhaler Inhale 2 puffs into the lungs every 6 (six) hours as needed for shortness of breath (use as needed or before activity for shortness of breath or whezzing). 8.5 each 6   Artificial Saliva (BIOTENE DRY MOUTH) LOZG Use as directed 1 Piece in the mouth or throat as needed (dry mouth).     atorvastatin  (LIPITOR) 10 MG tablet Take 0.5 tablets (5 mg total) by mouth daily. 15 tablet 0   clopidogrel  (PLAVIX ) 75 MG tablet TAKE 1 TABLET BY MOUTH EVERY DAY 90 tablet 3   diphenhydrAMINE  (BENADRYL ) 25 mg capsule Take 25 mg by mouth every 6 (six) hours as  needed for allergies.     EPINEPHrine  0.3 mg/0.3 mL IJ SOAJ injection Inject 0.3 mg into the muscle as needed for anaphylaxis (for bee stings - Patient has one on hand).     famotidine  (PEPCID ) 20 MG tablet TAKE 1 TABLET BY MOUTH EVERYDAY AT BEDTIME 90 tablet 1   fexofenadine (ALLEGRA) 180 MG tablet Take 180 mg by mouth daily as needed for allergies or rhinitis.     furosemide  (LASIX ) 20 MG tablet Take 1 tablet (20 mg total) by mouth daily. With potassium chloride  90 tablet 3   levothyroxine  (SYNTHROID ) 75 MCG tablet Take 75 mcg by mouth daily before breakfast.     losartan  (COZAAR ) 25 MG tablet Take 0.5 tablets (12.5 mg total) by mouth daily. 45 tablet 3   mometasone (NASONEX) 50 MCG/ACT nasal spray Place 2 sprays into the nose daily.     montelukast  (SINGULAIR ) 10 MG tablet TAKE 1 TABLET BY MOUTH EVERYDAY AT BEDTIME 90 tablet 3   Multiple Vitamin (MULTIVITAMIN WITH MINERALS) TABS tablet Take 1 tablet by mouth every other day.     oxyCODONE  (OXY IR/ROXICODONE ) 5 MG immediate release tablet Take 1 tablet (5 mg total) by mouth every 6 (six) hours as needed for moderate pain (pain score 4-6). 15 tablet 0   Peak Flow Meter DEVI 1 puff by Does not apply route in the morning and at bedtime. 1 each 0   Polyethyl Glycol-Propyl Glycol (SYSTANE) 0.4-0.3 % SOLN Place 1 drop into both eyes daily as needed (dry/irritated eyes.).     potassium chloride  (KLOR-CON ) 10 MEQ tablet Take 1 tablet (10 mEq total) by mouth daily. 90 tablet 3   propranolol  (INDERAL ) 10 MG tablet TAKE 1 TABLET BY MOUTH TWICE A DAY 180 tablet 2   sodium chloride  (OCEAN) 0.65 % nasal spray Place 1 spray into the nose every other day.     Spacer/Aero-Holding Chambers (AEROCHAMBER PLS FLOVU MTHPIECE) DEVI      Tiotropium Bromide-Olodaterol (STIOLTO RESPIMAT ) 2.5-2.5 MCG/ACT AERS Inhale 2 puffs into the lungs daily at 2 PM. 12 g 3   azithromycin  (ZITHROMAX ) 250 MG tablet Take two tablets on day 1, then one tablet daily on day 2-5. (Patient not  taking: Reported on 01/26/2024) 6 tablet 0   benzonatate  (TESSALON ) 100 MG capsule Take 100 mg by mouth 3 (three) times daily as needed. (Patient not taking: Reported on 01/27/2024)     ipratropium (ATROVENT ) 0.03 % nasal spray Place 2 sprays into both nostrils every 12 (twelve) hours. (Patient not taking: Reported  on 01/27/2024) 30 mL 5   No facility-administered medications prior to visit.    Review of Systems  Constitutional:  Negative for chills, diaphoresis, fever, malaise/fatigue and weight loss.  HENT:  Negative for congestion.   Respiratory:  Positive for cough and shortness of breath. Negative for hemoptysis, sputum production and wheezing.   Cardiovascular:  Negative for chest pain, palpitations and leg swelling.     Objective:   Vitals:   01/27/24 1333  BP: 100/60  Pulse: 64  SpO2: 96%  Weight: 118 lb 6.4 oz (53.7 kg)  Height: 5' (1.524 m)   SpO2: 96 %  Physical Exam: General: Frail-appearing, no acute distress HENT: [place], AT Eyes: EOMI, no scleral icterus Respiratory: Clear to auscultation bilaterally.  No crackles, wheezing or rales Cardiovascular: RRR, -M/R/G, no JVD Extremities:-Edema,-tenderness Neuro: AAO x4, CNII-XII grossly intact Psych: Normal mood, normal affect   Data Reviewed:  Imaging: CT Chest 12/23/19 - S/p SVR. Tortuous thoracic aorta. Mild centrilobular emphysema. Large hiatal hernia. Stable pulmonary nodules including RML 7x5 mm and LLL ~4.5 mm, unchanged from prior imaging  CT Chest 12/25/20 - Unchanged 7 mm nodule, stable subcentimeter nodules. Large hiatal hernia.   CXR 10/22/21 - No acute infiltrate, effusion or edema. Large hiatal hernia  CT CAP 02/23/23 - Visualized parenchyma with new small pleural effusion and atelectasis on left side. RUL with tree in bud.  CXR 04/09/23 - On my read, no infiltrate effusion or edema. Large hiatal hernia  PFT: 05/05/20 FVC 1.7 (91%) FEV1 1.26 (93%) Ratio 73  TLC 84% DLCO 43% Interpretation:  Normal  spirometry. No obstructive or restrictive defect  Labs: WBC 05/04/20 9.9 Absolute eos 05/04/20 200  Assessment & Plan:   Discussion: 89 year old female with asthma, emphysema on CT who presents for follow-up. Overall stable shortness of breath. No exacerbation. Discussed clinical course and management of COPD/asthma including bronchodilator regimen, preventive care  and action plan for exacerbation.  Prior Inhalers Spiriva  - good except for cough Stiolto - improved cough control  Asthma/Emphysema - persistent dyspnea, stable. Deconditioning is likely contributing --CONTINUE Stiolto 2.5/2.5 mcg TWO puffs ONCE a day. REFILL --CONTINUE Albuterol  as needed for shortness of breath or wheezing. OK to take before activity --CONTINUE Singulair  10 mg daily.   Nasal congestion --CONTINUE nasal rinses followed by nasal medication --STOP atrovent  nasal spray due to ineffectiveness  History of TB exposure --QF-TB neg  RML lung nodule Stable subcentimeter nodules --Stable 7 mm nodule since 2020 --No further follow-up needed. Patient not interested in aggressive management  Health Maintenance Immunization History  Administered Date(s) Administered   Fluad Quad(high Dose 65+) 11/05/2021   Fluad Trivalent(High Dose 65+) 10/17/2023   INFLUENZA, HIGH DOSE SEASONAL PF 11/08/2009, 10/18/2011, 10/10/2014, 10/27/2015, 11/05/2017, 10/08/2022   Influenza Split 10/19/2012, 10/07/2016, 10/25/2019   Influenza Whole 10/27/2015   Influenza-Unspecified 11/08/2009, 10/18/2011, 10/10/2014, 10/27/2015, 11/05/2017   Moderna Sars-Covid-2 Vaccination 02/25/2019   PFIZER Comirnaty(Gray Top)Covid-19 Tri-Sucrose Vaccine 10/12/2021   PFIZER(Purple Top)SARS-COV-2 Vaccination 02/25/2019, 03/24/2019, 11/08/2019, 06/16/2020   Pfizer Covid-19 Vaccine Bivalent Booster 76yrs & up 10/05/2020, 10/09/2021, 09/17/2022   Pneumococcal Conjugate-13 09/21/2014, 11/24/2015   Pneumococcal Polysaccharide-23 04/29/1998, 01/21/2006,  06/05/2006, 05/10/2013   Td 06/22/2003   Td (Adult) 06/22/2003   Tdap 09/09/2013, 10/27/2015   Unspecified SARS-COV-2 Vaccination 10/10/2023   Zoster, Live 06/05/2006, 07/22/2017, 10/08/2017   CT Lung Screen - not indicated  No orders of the defined types were placed in this encounter.  Meds ordered this encounter  Medications   Tiotropium Bromide-Olodaterol (STIOLTO  RESPIMAT) 2.5-2.5 MCG/ACT AERS    Sig: Inhale 2 puffs into the lungs daily at 2 PM.    Dispense:  12 g    Refill:  3   Return in about 4 months (around 05/26/2024).  I have spent a total time of 25-minutes on the day of the appointment including chart review, data review, collecting history, coordinating care and discussing medical diagnosis and plan with the patient/family. Past medical history, allergies, medications were reviewed. Pertinent imaging, labs and tests included in this note have been reviewed and interpreted independently by me.  Rexann Lueras Slater Staff, MD [facility] 01/27/2024     "

## 2024-01-27 NOTE — Patient Instructions (Signed)
 Asthma/Emphysema - persistent dyspnea, stable. Deconditioning is likely contributing --CONTINUE Stiolto 2.5/2.5 mcg TWO puffs ONCE a day. REFILL --CONTINUE Albuterol  as needed for shortness of breath or wheezing. OK to take before activity --CONTINUE Singulair  10 mg daily.   Nasal congestion --CONTINUE nasal rinses followed by nasal medication --STOP atrovent  nasal spray due to ineffectiveness

## 2024-01-28 ENCOUNTER — Ambulatory Visit (HOSPITAL_COMMUNITY)
Admission: RE | Admit: 2024-01-28 | Discharge: 2024-01-28 | Disposition: A | Discharge status: Home / Self Care | Source: Ambulatory Visit | Attending: Physician Assistant | Admitting: Physician Assistant

## 2024-01-28 DIAGNOSIS — I5033 Acute on chronic diastolic (congestive) heart failure: Secondary | ICD-10-CM | POA: Insufficient documentation

## 2024-01-28 DIAGNOSIS — Z952 Presence of prosthetic heart valve: Secondary | ICD-10-CM | POA: Insufficient documentation

## 2024-01-28 DIAGNOSIS — I2510 Atherosclerotic heart disease of native coronary artery without angina pectoris: Secondary | ICD-10-CM | POA: Insufficient documentation

## 2024-01-28 DIAGNOSIS — I5032 Chronic diastolic (congestive) heart failure: Secondary | ICD-10-CM | POA: Diagnosis present

## 2024-01-28 LAB — ECHOCARDIOGRAM COMPLETE
AR max vel: 1.6 cm2
AV Area VTI: 1.71 cm2
AV Area mean vel: 1.87 cm2
AV Mean grad: 9.5 mmHg
AV Peak grad: 18.1 mmHg
Ao pk vel: 2.13 m/s
Area-P 1/2: 1.88 cm2
MV VTI: 1.48 cm2
S' Lateral: 1.52 cm

## 2024-02-02 ENCOUNTER — Encounter: Payer: Self-pay | Admitting: Cardiology

## 2024-02-02 ENCOUNTER — Ambulatory Visit: Attending: Cardiology | Admitting: Cardiology

## 2024-02-02 ENCOUNTER — Other Ambulatory Visit (HOSPITAL_COMMUNITY): Payer: Self-pay

## 2024-02-02 VITALS — BP 98/62 | HR 69 | Ht 60.0 in | Wt 116.6 lb

## 2024-02-02 DIAGNOSIS — L299 Pruritus, unspecified: Secondary | ICD-10-CM | POA: Diagnosis not present

## 2024-02-02 DIAGNOSIS — Z952 Presence of prosthetic heart valve: Secondary | ICD-10-CM | POA: Diagnosis not present

## 2024-02-02 DIAGNOSIS — I2510 Atherosclerotic heart disease of native coronary artery without angina pectoris: Secondary | ICD-10-CM | POA: Diagnosis not present

## 2024-02-02 DIAGNOSIS — I342 Nonrheumatic mitral (valve) stenosis: Secondary | ICD-10-CM | POA: Diagnosis not present

## 2024-02-02 DIAGNOSIS — I5032 Chronic diastolic (congestive) heart failure: Secondary | ICD-10-CM | POA: Diagnosis not present

## 2024-02-02 MED ORDER — POTASSIUM CHLORIDE ER 10 MEQ PO TBCR: 10.0000 meq | EXTENDED_RELEASE_TABLET | Freq: Every day | ORAL | Status: AC | PRN

## 2024-02-02 MED ORDER — FUROSEMIDE 20 MG PO TABS: 20.0000 mg | ORAL_TABLET | Freq: Every day | ORAL | Status: AC | PRN

## 2024-02-02 MED ORDER — HYDROXYZINE HCL 25 MG PO TABS
12.5000 mg | ORAL_TABLET | Freq: Two times a day (BID) | ORAL | 0 refills | Status: AC | PRN
  Filled 2024-02-02: qty 30, 30d supply, fill #0

## 2024-02-02 MED ORDER — HYDROXYZINE HCL 25 MG PO TABS
12.5000 mg | ORAL_TABLET | Freq: Three times a day (TID) | ORAL | 0 refills | Status: DC | PRN
  Filled 2024-02-02: qty 30, 20d supply, fill #0

## 2024-02-02 MED ORDER — HYDROXYZINE HCL 25 MG PO TABS: 12.5000 mg | ORAL_TABLET | Freq: Two times a day (BID) | ORAL | Status: DC | PRN

## 2024-02-02 MED ORDER — ATORVASTATIN CALCIUM 10 MG PO TABS
5.0000 mg | ORAL_TABLET | Freq: Every day | ORAL | 1 refills | Status: DC
  Filled 2024-02-02: qty 45, 90d supply, fill #0

## 2024-02-02 NOTE — Progress Notes (Signed)
 " Cardiology Office Note:  .   Date:  02/02/2024  ID:  [name] [name], DOB Feb 27, 1931, MRN [id number] PCP: Vernon Velna SAUNDERS, MD  [facility] Providers Cardiologist:  Gordy Bergamo, MD Structural Heart:  Lonni Cash, MD  History of Present Illness: .   [name] [name] is a 89 y.o. Caucasian female with history of benign positional vertigo, chronic dyspnea, cirrhosis of the liver, stage IIIb chronic kidney disease, chronic back pain, hypertension, mixed hyperlipidemia, CAD S/P PCI stent to LAD and balloon angioplasty D1 on 01/05/2019, residual high-grade stenosis of RI which is moderate-sized. Underwent elective TAVR on 01/26/2019, liver cirrhosis and portal hypertension.    Echocardiogram on 01/28/2024 revealing preserved LVEF, very mild but normal gradient across TAVR but also mild to moderate mitral stenosis secondary to severe mitral calcification.  She presents for routine visit, presently doing well and has not had any leg edema or abdominal distention.  She remains essentially asymptomatic.  She has noticed her blood pressure to be soft.    Discussed the use of AI scribe software for clinical note transcription with the patient, who gave verbal consent to proceed.  History of Present Illness [name] [name] is a 89 year old female with chronic kidney disease who presents with severe itching.  She reports severe, persistent generalized itching with visible spots, which is very bothersome and not improved with topical creams.  She has chronic kidney disease previously described as stage 3 and follows with a nephrologist, but she has not yet received results of recent blood work and is unsure if kidney function has worsened.  Her medications include losartan  12.5 mg once daily, propranolol  10 mg twice daily, and furosemide  20 mg as needed for leg swelling with potassium as needed when she uses furosemide . Atorvastatin  was stopped temporarily because of possible liver issues.  She  reports significant emotional stress after her husband's death in December 22, 2024. She also feels unusually sleepy and tired.  Cardiac Studies relevent.    ECHOCARDIOGRAM COMPLETE 01/28/2024 1. There appears to be a midcavitary gradient not well characterized . Left ventricular ejection fraction, by estimation, is 60 to 65%. The left ventricle has normal function. The left ventricle has no regional wall motion abnormalities. There is mild left ventricular hypertrophy. Left ventricular diastolic parameters are indeterminate. 2. Right ventricular systolic function is normal. The right ventricular size is normal. 3. Mean gradient 5 peak 9 mmHg at HR 78 bpm MVA by PT1/2 1.8 cm2. The mitral valve is degenerative. Trivial mitral valve regurgitation. Mild to moderate mitral stenosis. Severe mitral annular calcification. 4. Tricuspid valve regurgitation is moderate. 5. Prior TAVR with 23 mm Medtronic CoreValve. gradients similar to TTE done 04/24/23. There is at least mild AR. Difficult with current images to tell origin. However image # 102 suggests that it is PVL. The aortic valve has been repaired/replaced. Aortic valve regurgitation is not visualized. No aortic stenosis is present. 6. The inferior vena cava is normal in size with greater than 50% respiratory variability, suggesting right atrial pressure of 3 mmHg.  Comparison(s): EF 65%, mild LVH, mild-moderate MS, mean 6.0 mmHg, moderate MAC, TAVR mean 10 peak 18.7 mmHg, no peri-valvular leakage noted. ______________________________________________________________________________________________     EKG:      Labs   Lab Results  Component Value Date   CHOL 175 09/23/2022   HDL 63 09/23/2022   LDLCALC 99 09/23/2022   TRIG 65 09/23/2022   CHOLHDL 2.8 09/23/2022   No results found for: LIPOA  Recent Labs  02/26/23 0445 02/27/23 0432 02/28/23 0543 03/06/23 1138  NA 128* 129* 130* 135  K 3.8 3.6 3.9 3.9  CL 99 99 100 95*  CO2 18* 22 23 26    GLUCOSE 97 111* 107* 118*  BUN 11 13 14 20   CREATININE 1.15* 1.26* 1.36* 1.30*  CALCIUM  8.0* 8.2* 8.3* 8.4*  GFRNONAA 46* 41* 37*  --     Lab Results  Component Value Date   ALT 33 02/23/2023   AST 41 02/23/2023   ALKPHOS 315 (H) 02/23/2023   BILITOT 1.2 02/23/2023      Latest Ref Rng & Units 02/26/2023    4:45 AM 02/25/2023    4:23 AM 02/23/2023    1:57 PM  CBC  WBC 4.0 - 10.5 K/uL 8.0  7.7  13.8   Hemoglobin 12.0 - 15.0 g/dL 89.6  9.9  88.1   Hematocrit 36.0 - 46.0 % 30.5  29.2  33.8   Platelets 150 - 400 K/uL 224  224  299    Lab Results  Component Value Date   HGBA1C 5.9 (H) 11/28/2022    Lab Results  Component Value Date   TSH 5.712 (H) 02/24/2023     ROS  Review of Systems  Cardiovascular:  Negative for chest pain, dyspnea on exertion and leg swelling.   Physical Exam:   VS:  BP 98/62   Pulse 69   Ht 5' (1.524 m)   Wt 116 lb 9.6 oz (52.9 kg)   SpO2 98%   BMI 22.77 kg/m    Wt Readings from Last 3 Encounters:  02/02/24 116 lb 9.6 oz (52.9 kg)  01/27/24 118 lb 6.4 oz (53.7 kg)  01/26/24 119 lb 6.4 oz (54.2 kg)    BP Readings from Last 3 Encounters:  02/02/24 98/62  01/27/24 100/60  01/26/24 (!) 100/58   Physical Exam Neck:     Vascular: No JVD.  Cardiovascular:     Rate and Rhythm: Normal rate and regular rhythm.     Pulses: Intact distal pulses.     Heart sounds: S1 normal and S2 normal. Murmur heard.     Early systolic murmur is present with a grade of 2/6 at the upper right sternal border.     No gallop.  Pulmonary:     Effort: Pulmonary effort is normal.     Breath sounds: Normal breath sounds.  Abdominal:     General: Bowel sounds are normal.     Palpations: Abdomen is soft.  Musculoskeletal:     Right lower leg: No edema.     Left lower leg: No edema.     ASSESSMENT AND PLAN: .      ICD-10-CM   1. Chronic heart failure with preserved ejection fraction (HCC)  I50.32 furosemide  (LASIX ) 20 MG tablet    2. Nonrheumatic mitral valve  stenosis  I34.2     3. History of transcatheter aortic valve replacement (TAVR)  Z95.2     4. Atherosclerosis of native coronary artery of native heart without angina pectoris  I25.10 atorvastatin  (LIPITOR) 10 MG tablet    5. 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 Previous treatments with topical creams have been ineffective. - Prescribed hydroxyzine  12.5 mg tablets, to be taken two times a day as needed for itching. - Advised to break the tablets in half and take three  times a day. - Instructed to stop the medication if it does not work.  Chronic heart failure with preserved ejection fraction (HFpEF) HFpEF with well-managed heart function. No current leg swelling, indicating improvement. Blood pressure is low, necessitating medication adjustments. - Continue losartan  12.5 mg once daily. - Continue propranolol  10 mg twice daily. - Changed furosemide  and potassium supplementation to as needed for leg swelling. - Her blood pressure is also soft and she does not appear volume overloaded.  She is very careful with her medications and her diet, she will also watch her for any excess abnormal weight gain and start furosemide  if needed.  Nonrheumatic mitral valve stenosis/TAVR Well-managed with no current concerns regarding valvular heart disease.  Atherosclerotic heart disease of native coronary artery Atherosclerotic heart disease with previous concerns about liver issues related to atorvastatin . No adverse effects from atorvastatin  noted. - Restarted atorvastatin  5 mg once daily. - Sent prescription to pharmacy for refill.   Follow up: 6 months. Chronic diastolic heart failure, Mod Mitral stenoiss, CAD   Signed,  Gordy Bergamo, MD, [facility] 02/02/2024, 2:18 PM [facility] [address] [place], [place] [address] Phone:  [phone]. Fax:  [phone]  "

## 2024-02-02 NOTE — Patient Instructions (Signed)
 Medication Instructions:  Start Atorvastatin  10 mg daily Hydroxyzine  12.5 mg up to 3 times daily as needed for itching  Take Furosemide  20 mg daily as needed for swelling. Also, if you gain 2-3 pounds overnight or 5 pounds in a week. Take Potassium 10 meq when you take Furosemide .   *If you need a refill on your cardiac medications before your next appointment, please call your pharmacy*  Lab Work: None ordered If you have labs (blood work) drawn today and your tests are completely normal, you will receive your results only by: MyChart Message (if you have MyChart) OR A paper copy in the mail If you have any lab test that is abnormal or we need to change your treatment, we will call you to review the results.  Testing/Procedures: None ordered  Follow-Up: At [facility], you and your health needs are our priority.  As part of our continuing mission to provide you with exceptional heart care, our providers are all part of one team.  This team includes your primary Cardiologist (physician) and Advanced Practice Providers or APPs (Physician Assistants and Nurse Practitioners) who all work together to provide you with the care you need, when you need it.  Your next appointment:   6 month(s)  Provider:   Gordy Bergamo, MD    We recommend signing up for the patient portal called MyChart.  Sign up information is provided on this After Visit Summary.  MyChart is used to connect with patients for Virtual Visits (Telemedicine).  Patients are able to view lab/test results, encounter notes, upcoming appointments, etc.  Non-urgent messages can be sent to your provider as well.   To learn more about what you can do with MyChart, go to [email or web address].

## 2024-02-02 NOTE — Addendum Note (Signed)
 Addended by: LADONA MILAN on: 02/02/2024 02:39 PM   Modules accepted: Orders

## 2024-02-12 ENCOUNTER — Other Ambulatory Visit: Payer: Self-pay | Admitting: Cardiology

## 2024-02-12 DIAGNOSIS — I2510 Atherosclerotic heart disease of native coronary artery without angina pectoris: Secondary | ICD-10-CM

## 2024-02-19 NOTE — Telephone Encounter (Signed)
 Pt had an appt on 02/02/24 Last Lipids done in 2024  In accordance with refill protocols, please review and address the following requirements before this medication refill can be authorized:  Labs

## 2024-02-20 ENCOUNTER — Other Ambulatory Visit: Payer: Self-pay | Admitting: Cardiology

## 2024-02-20 DIAGNOSIS — I5032 Chronic diastolic (congestive) heart failure: Secondary | ICD-10-CM

## 2024-02-24 ENCOUNTER — Other Ambulatory Visit: Payer: Self-pay | Admitting: Radiology

## 2024-02-25 LAB — SURGICAL PATHOLOGY

## 2024-05-26 ENCOUNTER — Ambulatory Visit (HOSPITAL_BASED_OUTPATIENT_CLINIC_OR_DEPARTMENT_OTHER): Admitting: Pulmonary Disease
